# Patient Record
Sex: Female | Born: 1940
Health system: Southern US, Community
[De-identification: ages and names within clinical notes are randomized; demographics above are authoritative.]

## PROBLEM LIST (undated history)

## (undated) DIAGNOSIS — E785 Hyperlipidemia, unspecified: Secondary | ICD-10-CM

## (undated) DIAGNOSIS — Z803 Family history of malignant neoplasm of breast: Secondary | ICD-10-CM

## (undated) DIAGNOSIS — K219 Gastro-esophageal reflux disease without esophagitis: Secondary | ICD-10-CM

## (undated) DIAGNOSIS — C801 Malignant (primary) neoplasm, unspecified: Secondary | ICD-10-CM

## (undated) DIAGNOSIS — M549 Dorsalgia, unspecified: Secondary | ICD-10-CM

## (undated) DIAGNOSIS — G4730 Sleep apnea, unspecified: Secondary | ICD-10-CM

## (undated) DIAGNOSIS — M1990 Unspecified osteoarthritis, unspecified site: Secondary | ICD-10-CM

## (undated) DIAGNOSIS — M2550 Pain in unspecified joint: Secondary | ICD-10-CM

## (undated) DIAGNOSIS — E669 Obesity, unspecified: Secondary | ICD-10-CM

## (undated) DIAGNOSIS — M4800 Spinal stenosis, site unspecified: Secondary | ICD-10-CM

## (undated) DIAGNOSIS — E739 Lactose intolerance, unspecified: Secondary | ICD-10-CM

## (undated) DIAGNOSIS — I10 Essential (primary) hypertension: Secondary | ICD-10-CM

## (undated) DIAGNOSIS — N289 Disorder of kidney and ureter, unspecified: Secondary | ICD-10-CM

## (undated) DIAGNOSIS — Z800 Family history of malignant neoplasm of digestive organs: Secondary | ICD-10-CM

## (undated) DIAGNOSIS — G709 Myoneural disorder, unspecified: Secondary | ICD-10-CM

## (undated) DIAGNOSIS — Z923 Personal history of irradiation: Secondary | ICD-10-CM

## (undated) DIAGNOSIS — C50919 Malignant neoplasm of unspecified site of unspecified female breast: Secondary | ICD-10-CM

## (undated) HISTORY — DX: Unspecified osteoarthritis, unspecified site: M19.90

## (undated) HISTORY — DX: Dorsalgia, unspecified: M54.9

## (undated) HISTORY — DX: Malignant (primary) neoplasm, unspecified: C80.1

## (undated) HISTORY — PX: ABDOMINAL HYSTERECTOMY: SHX81

## (undated) HISTORY — DX: Disorder of kidney and ureter, unspecified: N28.9

## (undated) HISTORY — DX: Spinal stenosis, site unspecified: M48.00

## (undated) HISTORY — DX: Hyperlipidemia, unspecified: E78.5

## (undated) HISTORY — DX: Essential (primary) hypertension: I10

## (undated) HISTORY — DX: Family history of malignant neoplasm of breast: Z80.3

## (undated) HISTORY — DX: Lactose intolerance, unspecified: E73.9

## (undated) HISTORY — DX: Gastro-esophageal reflux disease without esophagitis: K21.9

## (undated) HISTORY — DX: Family history of malignant neoplasm of digestive organs: Z80.0

## (undated) HISTORY — PX: X-STOP IMPLANTATION: SHX2677

## (undated) HISTORY — DX: Obesity, unspecified: E66.9

## (undated) HISTORY — PX: BREAST SURGERY: SHX581

## (undated) HISTORY — DX: Pain in unspecified joint: M25.50

## (undated) HISTORY — PX: COLON SURGERY: SHX602

## (undated) HISTORY — DX: Myoneural disorder, unspecified: G70.9

## (undated) HISTORY — DX: Sleep apnea, unspecified: G47.30

---

## 1970-06-16 HISTORY — PX: BREAST EXCISIONAL BIOPSY: SUR124

## 1999-02-14 ENCOUNTER — Ambulatory Visit (HOSPITAL_COMMUNITY): Admission: RE | Admit: 1999-02-14 | Discharge: 1999-02-14 | Payer: Self-pay | Admitting: Gastroenterology

## 1999-02-22 ENCOUNTER — Ambulatory Visit (HOSPITAL_COMMUNITY): Admission: RE | Admit: 1999-02-22 | Discharge: 1999-02-22 | Payer: Self-pay | Admitting: Gastroenterology

## 1999-03-05 ENCOUNTER — Encounter: Payer: Self-pay | Admitting: General Surgery

## 1999-03-07 ENCOUNTER — Inpatient Hospital Stay (HOSPITAL_COMMUNITY): Admission: RE | Admit: 1999-03-07 | Discharge: 1999-03-13 | Payer: Self-pay | Admitting: General Surgery

## 1999-03-07 ENCOUNTER — Encounter (INDEPENDENT_AMBULATORY_CARE_PROVIDER_SITE_OTHER): Payer: Self-pay | Admitting: Specialist

## 1999-08-16 ENCOUNTER — Encounter: Admission: RE | Admit: 1999-08-16 | Discharge: 1999-08-16 | Payer: Self-pay | Admitting: Family Medicine

## 1999-08-16 ENCOUNTER — Encounter: Payer: Self-pay | Admitting: Family Medicine

## 1999-12-04 ENCOUNTER — Encounter (INDEPENDENT_AMBULATORY_CARE_PROVIDER_SITE_OTHER): Payer: Self-pay | Admitting: *Deleted

## 1999-12-04 ENCOUNTER — Ambulatory Visit (HOSPITAL_COMMUNITY): Admission: RE | Admit: 1999-12-04 | Discharge: 1999-12-04 | Payer: Self-pay | Admitting: Gastroenterology

## 2000-08-27 ENCOUNTER — Encounter: Payer: Self-pay | Admitting: Family Medicine

## 2000-08-27 ENCOUNTER — Encounter: Admission: RE | Admit: 2000-08-27 | Discharge: 2000-08-27 | Payer: Self-pay | Admitting: Family Medicine

## 2001-08-30 ENCOUNTER — Encounter: Payer: Self-pay | Admitting: Family Medicine

## 2001-08-30 ENCOUNTER — Encounter: Admission: RE | Admit: 2001-08-30 | Discharge: 2001-08-30 | Payer: Self-pay | Admitting: Family Medicine

## 2001-11-18 ENCOUNTER — Ambulatory Visit (HOSPITAL_COMMUNITY): Admission: RE | Admit: 2001-11-18 | Discharge: 2001-11-18 | Payer: Self-pay | Admitting: Family Medicine

## 2002-09-01 ENCOUNTER — Encounter: Admission: RE | Admit: 2002-09-01 | Discharge: 2002-09-01 | Payer: Self-pay | Admitting: Family Medicine

## 2002-09-01 ENCOUNTER — Encounter: Payer: Self-pay | Admitting: Family Medicine

## 2003-08-02 ENCOUNTER — Other Ambulatory Visit: Admission: RE | Admit: 2003-08-02 | Discharge: 2003-08-02 | Payer: Self-pay | Admitting: Family Medicine

## 2003-09-04 ENCOUNTER — Encounter: Admission: RE | Admit: 2003-09-04 | Discharge: 2003-09-04 | Payer: Self-pay | Admitting: Family Medicine

## 2004-09-04 ENCOUNTER — Encounter: Admission: RE | Admit: 2004-09-04 | Discharge: 2004-09-04 | Payer: Self-pay | Admitting: Family Medicine

## 2004-09-17 ENCOUNTER — Encounter: Admission: RE | Admit: 2004-09-17 | Discharge: 2004-09-17 | Payer: Self-pay | Admitting: Family Medicine

## 2005-09-08 ENCOUNTER — Encounter: Admission: RE | Admit: 2005-09-08 | Discharge: 2005-09-08 | Payer: Self-pay | Admitting: Family Medicine

## 2006-09-11 ENCOUNTER — Encounter: Admission: RE | Admit: 2006-09-11 | Discharge: 2006-09-11 | Payer: Self-pay | Admitting: Family Medicine

## 2007-09-16 ENCOUNTER — Encounter: Admission: RE | Admit: 2007-09-16 | Discharge: 2007-09-16 | Payer: Self-pay | Admitting: Family Medicine

## 2008-09-18 ENCOUNTER — Encounter: Admission: RE | Admit: 2008-09-18 | Discharge: 2008-09-18 | Payer: Self-pay | Admitting: Family Medicine

## 2009-08-30 ENCOUNTER — Encounter: Admission: RE | Admit: 2009-08-30 | Discharge: 2009-08-30 | Payer: Self-pay | Admitting: Family Medicine

## 2009-09-24 ENCOUNTER — Encounter: Admission: RE | Admit: 2009-09-24 | Discharge: 2009-09-24 | Payer: Self-pay | Admitting: Family Medicine

## 2010-06-10 ENCOUNTER — Emergency Department (HOSPITAL_COMMUNITY)
Admission: EM | Admit: 2010-06-10 | Discharge: 2010-06-10 | Payer: Self-pay | Source: Home / Self Care | Admitting: Emergency Medicine

## 2010-06-16 DIAGNOSIS — C50919 Malignant neoplasm of unspecified site of unspecified female breast: Secondary | ICD-10-CM

## 2010-06-16 DIAGNOSIS — Z923 Personal history of irradiation: Secondary | ICD-10-CM

## 2010-06-16 HISTORY — DX: Personal history of irradiation: Z92.3

## 2010-06-16 HISTORY — DX: Malignant neoplasm of unspecified site of unspecified female breast: C50.919

## 2010-06-16 HISTORY — PX: BREAST LUMPECTOMY: SHX2

## 2010-06-19 ENCOUNTER — Encounter
Admission: RE | Admit: 2010-06-19 | Discharge: 2010-06-19 | Payer: Self-pay | Source: Home / Self Care | Attending: Family Medicine | Admitting: Family Medicine

## 2010-08-26 ENCOUNTER — Other Ambulatory Visit: Payer: Self-pay | Admitting: Family Medicine

## 2010-08-26 DIAGNOSIS — Z1231 Encounter for screening mammogram for malignant neoplasm of breast: Secondary | ICD-10-CM

## 2010-08-26 LAB — POCT I-STAT, CHEM 8
Chloride: 105 mEq/L (ref 96–112)
Creatinine, Ser: 0.6 mg/dL (ref 0.4–1.2)
HCT: 37 % (ref 36.0–46.0)
TCO2: 28 mmol/L (ref 0–100)

## 2010-08-26 LAB — URINE MICROSCOPIC-ADD ON

## 2010-08-26 LAB — URINE CULTURE
Colony Count: 30000
Culture  Setup Time: 201112261700

## 2010-08-26 LAB — DIFFERENTIAL
Eosinophils Relative: 2 % (ref 0–5)
Lymphocytes Relative: 30 % (ref 12–46)
Lymphs Abs: 2.3 10*3/uL (ref 0.7–4.0)
Monocytes Relative: 6 % (ref 3–12)
Neutrophils Relative %: 62 % (ref 43–77)

## 2010-08-26 LAB — CBC
Hemoglobin: 12.3 g/dL (ref 12.0–15.0)
MCH: 30.3 pg (ref 26.0–34.0)
RDW: 13.3 % (ref 11.5–15.5)
WBC: 7.6 10*3/uL (ref 4.0–10.5)

## 2010-08-26 LAB — URINALYSIS, ROUTINE W REFLEX MICROSCOPIC
Nitrite: POSITIVE — AB
Specific Gravity, Urine: 1.021 (ref 1.005–1.030)

## 2010-09-30 ENCOUNTER — Ambulatory Visit
Admission: RE | Admit: 2010-09-30 | Discharge: 2010-09-30 | Disposition: A | Discharge status: Home / Self Care | Payer: Medicare Other | Source: Ambulatory Visit | Attending: Family Medicine | Admitting: Family Medicine

## 2010-09-30 DIAGNOSIS — Z1231 Encounter for screening mammogram for malignant neoplasm of breast: Secondary | ICD-10-CM

## 2010-10-01 ENCOUNTER — Other Ambulatory Visit: Payer: Self-pay | Admitting: Family Medicine

## 2010-10-01 DIAGNOSIS — R928 Other abnormal and inconclusive findings on diagnostic imaging of breast: Secondary | ICD-10-CM

## 2010-10-08 ENCOUNTER — Ambulatory Visit
Admission: RE | Admit: 2010-10-08 | Discharge: 2010-10-08 | Disposition: A | Discharge status: Home / Self Care | Payer: Medicare Other | Source: Ambulatory Visit | Attending: Family Medicine | Admitting: Family Medicine

## 2010-10-08 ENCOUNTER — Other Ambulatory Visit: Payer: Self-pay | Admitting: Family Medicine

## 2010-10-08 ENCOUNTER — Other Ambulatory Visit: Payer: Self-pay | Admitting: Diagnostic Radiology

## 2010-10-08 DIAGNOSIS — R928 Other abnormal and inconclusive findings on diagnostic imaging of breast: Secondary | ICD-10-CM

## 2010-10-09 ENCOUNTER — Other Ambulatory Visit: Payer: Self-pay | Admitting: Family Medicine

## 2010-10-09 DIAGNOSIS — C50911 Malignant neoplasm of unspecified site of right female breast: Secondary | ICD-10-CM

## 2010-10-11 ENCOUNTER — Other Ambulatory Visit: Payer: Self-pay | Admitting: Family Medicine

## 2010-10-11 ENCOUNTER — Other Ambulatory Visit: Payer: Medicare Other

## 2010-10-11 ENCOUNTER — Ambulatory Visit
Admission: RE | Admit: 2010-10-11 | Discharge: 2010-10-11 | Disposition: A | Discharge status: Home / Self Care | Payer: Medicare Other | Source: Ambulatory Visit | Attending: Family Medicine | Admitting: Family Medicine

## 2010-10-11 DIAGNOSIS — C50911 Malignant neoplasm of unspecified site of right female breast: Secondary | ICD-10-CM

## 2010-10-11 DIAGNOSIS — R928 Other abnormal and inconclusive findings on diagnostic imaging of breast: Secondary | ICD-10-CM

## 2010-10-11 MED ORDER — GADOBENATE DIMEGLUMINE 529 MG/ML IV SOLN
20.0000 mL | Freq: Once | INTRAVENOUS | Status: AC | PRN
  Administered 2010-10-11: 20 mL via INTRAVENOUS

## 2010-10-15 ENCOUNTER — Ambulatory Visit
Admission: RE | Admit: 2010-10-15 | Discharge: 2010-10-15 | Disposition: A | Discharge status: Home / Self Care | Payer: Medicare Other | Source: Ambulatory Visit | Attending: Family Medicine | Admitting: Family Medicine

## 2010-10-15 ENCOUNTER — Other Ambulatory Visit: Payer: Self-pay | Admitting: Diagnostic Radiology

## 2010-10-15 DIAGNOSIS — R928 Other abnormal and inconclusive findings on diagnostic imaging of breast: Secondary | ICD-10-CM

## 2010-10-15 MED ORDER — GADOBENATE DIMEGLUMINE 529 MG/ML IV SOLN
20.0000 mL | Freq: Once | INTRAVENOUS | Status: AC | PRN
  Administered 2010-10-15: 20 mL via INTRAVENOUS

## 2010-10-16 ENCOUNTER — Encounter (HOSPITAL_BASED_OUTPATIENT_CLINIC_OR_DEPARTMENT_OTHER): Payer: Medicare Other | Admitting: Oncology

## 2010-10-16 ENCOUNTER — Other Ambulatory Visit: Payer: Self-pay | Admitting: Family Medicine

## 2010-10-16 ENCOUNTER — Other Ambulatory Visit: Payer: Self-pay | Admitting: Oncology

## 2010-10-16 DIAGNOSIS — R928 Other abnormal and inconclusive findings on diagnostic imaging of breast: Secondary | ICD-10-CM

## 2010-10-16 DIAGNOSIS — C50919 Malignant neoplasm of unspecified site of unspecified female breast: Secondary | ICD-10-CM

## 2010-10-16 DIAGNOSIS — C50911 Malignant neoplasm of unspecified site of right female breast: Secondary | ICD-10-CM

## 2010-10-16 DIAGNOSIS — R9389 Abnormal findings on diagnostic imaging of other specified body structures: Secondary | ICD-10-CM

## 2010-10-16 LAB — CBC WITH DIFFERENTIAL/PLATELET
Basophils Absolute: 0 10*3/uL (ref 0.0–0.1)
Eosinophils Absolute: 0.3 10*3/uL (ref 0.0–0.5)
HCT: 35.9 % (ref 34.8–46.6)
HGB: 12.3 g/dL (ref 11.6–15.9)
LYMPH%: 26 % (ref 14.0–49.7)
MONO#: 0.5 10*3/uL (ref 0.1–0.9)
NEUT#: 4 10*3/uL (ref 1.5–6.5)
NEUT%: 61.4 % (ref 38.4–76.8)
Platelets: 236 10*3/uL (ref 145–400)
WBC: 6.6 10*3/uL (ref 3.9–10.3)

## 2010-10-16 LAB — CANCER ANTIGEN 27.29: CA 27.29: 10 U/mL (ref 0–39)

## 2010-10-16 LAB — COMPREHENSIVE METABOLIC PANEL
CO2: 28 mEq/L (ref 19–32)
Calcium: 9.5 mg/dL (ref 8.4–10.5)
Creatinine, Ser: 0.79 mg/dL (ref 0.40–1.20)
Glucose, Bld: 111 mg/dL — ABNORMAL HIGH (ref 70–99)
Total Bilirubin: 0.2 mg/dL — ABNORMAL LOW (ref 0.3–1.2)

## 2010-10-21 ENCOUNTER — Ambulatory Visit
Admission: RE | Admit: 2010-10-21 | Discharge: 2010-10-21 | Disposition: A | Discharge status: Home / Self Care | Payer: Medicare Other | Source: Ambulatory Visit | Attending: Family Medicine | Admitting: Family Medicine

## 2010-10-21 ENCOUNTER — Other Ambulatory Visit: Payer: Self-pay | Admitting: Diagnostic Radiology

## 2010-10-21 DIAGNOSIS — R9389 Abnormal findings on diagnostic imaging of other specified body structures: Secondary | ICD-10-CM

## 2010-10-21 DIAGNOSIS — R928 Other abnormal and inconclusive findings on diagnostic imaging of breast: Secondary | ICD-10-CM

## 2010-10-21 MED ORDER — GADOBENATE DIMEGLUMINE 529 MG/ML IV SOLN
20.0000 mL | Freq: Once | INTRAVENOUS | Status: AC | PRN
  Administered 2010-10-21: 20 mL via INTRAVENOUS

## 2010-10-24 ENCOUNTER — Other Ambulatory Visit: Payer: Self-pay | Admitting: General Surgery

## 2010-10-24 DIAGNOSIS — C50919 Malignant neoplasm of unspecified site of unspecified female breast: Secondary | ICD-10-CM

## 2010-10-25 ENCOUNTER — Other Ambulatory Visit (HOSPITAL_COMMUNITY): Payer: Self-pay | Admitting: General Surgery

## 2010-10-25 ENCOUNTER — Ambulatory Visit (HOSPITAL_COMMUNITY)
Admission: RE | Admit: 2010-10-25 | Discharge: 2010-10-25 | Disposition: A | Discharge status: Home / Self Care | Payer: Medicare Other | Source: Ambulatory Visit | Attending: General Surgery | Admitting: General Surgery

## 2010-10-25 ENCOUNTER — Encounter (HOSPITAL_COMMUNITY)
Admission: RE | Admit: 2010-10-25 | Discharge: 2010-10-25 | Disposition: A | Discharge status: Home / Self Care | Payer: Medicare Other | Source: Ambulatory Visit | Attending: General Surgery | Admitting: General Surgery

## 2010-10-25 DIAGNOSIS — C50911 Malignant neoplasm of unspecified site of right female breast: Secondary | ICD-10-CM

## 2010-10-25 DIAGNOSIS — Z01818 Encounter for other preprocedural examination: Secondary | ICD-10-CM | POA: Insufficient documentation

## 2010-10-25 DIAGNOSIS — C50919 Malignant neoplasm of unspecified site of unspecified female breast: Secondary | ICD-10-CM | POA: Insufficient documentation

## 2010-10-25 DIAGNOSIS — Z01812 Encounter for preprocedural laboratory examination: Secondary | ICD-10-CM | POA: Insufficient documentation

## 2010-10-25 DIAGNOSIS — Z01810 Encounter for preprocedural cardiovascular examination: Secondary | ICD-10-CM | POA: Insufficient documentation

## 2010-10-25 LAB — SURGICAL PCR SCREEN: Staphylococcus aureus: POSITIVE — AB

## 2010-10-28 ENCOUNTER — Ambulatory Visit (HOSPITAL_COMMUNITY)
Admission: RE | Admit: 2010-10-28 | Discharge: 2010-10-28 | Disposition: A | Discharge status: Home / Self Care | Payer: Medicare Other | Source: Ambulatory Visit | Attending: General Surgery | Admitting: General Surgery

## 2010-10-28 ENCOUNTER — Other Ambulatory Visit: Payer: Self-pay | Admitting: General Surgery

## 2010-10-28 ENCOUNTER — Ambulatory Visit
Admission: RE | Admit: 2010-10-28 | Discharge: 2010-10-28 | Disposition: A | Discharge status: Home / Self Care | Payer: Medicare Other | Source: Ambulatory Visit | Attending: General Surgery | Admitting: General Surgery

## 2010-10-28 DIAGNOSIS — C50919 Malignant neoplasm of unspecified site of unspecified female breast: Secondary | ICD-10-CM

## 2010-10-28 DIAGNOSIS — C50219 Malignant neoplasm of upper-inner quadrant of unspecified female breast: Secondary | ICD-10-CM | POA: Insufficient documentation

## 2010-10-28 DIAGNOSIS — M6289 Other specified disorders of muscle: Secondary | ICD-10-CM | POA: Insufficient documentation

## 2010-10-28 DIAGNOSIS — E669 Obesity, unspecified: Secondary | ICD-10-CM | POA: Insufficient documentation

## 2010-10-28 DIAGNOSIS — I10 Essential (primary) hypertension: Secondary | ICD-10-CM | POA: Insufficient documentation

## 2010-10-28 DIAGNOSIS — Z01810 Encounter for preprocedural cardiovascular examination: Secondary | ICD-10-CM | POA: Insufficient documentation

## 2010-10-28 DIAGNOSIS — C50911 Malignant neoplasm of unspecified site of right female breast: Secondary | ICD-10-CM

## 2010-10-28 DIAGNOSIS — Z01812 Encounter for preprocedural laboratory examination: Secondary | ICD-10-CM | POA: Insufficient documentation

## 2010-10-28 MED ORDER — TECHNETIUM TC 99M SULFUR COLLOID FILTERED
1.0000 | Freq: Once | INTRAVENOUS | Status: AC | PRN
  Administered 2010-10-28: 1 via INTRADERMAL

## 2010-10-30 NOTE — Op Note (Signed)
Kathleen [name], Kathleen [name]              ACCOUNT NO.:  [id number]  MEDICAL RECORD NO.:  [id number]           PATIENT TYPE:  O  LOCATION:  SDSC                         FACILITY:  [facility]  PHYSICIAN:  Adolph Pollack, M.D.DATE OF BIRTH:  1941/03/18  DATE OF PROCEDURE:  10/28/2010 DATE OF DISCHARGE:  10/28/2010                              OPERATIVE REPORT   PREOPERATIVE DIAGNOSIS:  Invasive right breast cancer.  POSTOPERATIVE DIAGNOSIS:  Invasive right breast cancer.  PROCEDURE: 1. Right axillary lymphatic mapping and injection of blue dye into     right breast. 2. Right axillary sentinel lymph node biopsy (two lymph nodes). 3. Right partial mastectomy after needle localization (upper inner     quadrant)  SURGEON:  Adolph Pollack, MD  ANESTHESIA:  General plus Marcaine local.  INDICATIONS:  Kathleen [name] is a 70 year old female who was found to have three radiographic lesions in the right breast.  Two are benign,however, one is at 2 o'clock position is invasive ductal carcinoma.  She now presents for the above procedures.  We discussed the procedure risks and aftercare preoperatively.  TECHNIQUE:  She was seen in the holding area and right breast was marked with my initials.  She then had a successful injection of radioactive material in the periareolar area.  She was then brought to the operating room, placed supine on the operating table and general anesthetic was administered.  The elevated radioactive counts were noted in the right axilla, and a marking pen was used to mark that site.  I then sterilely prepped the circumareolar area and injected 1.5 mL of methylene blue at 12, 3, 6, and 9 o'clock positions and massaged the breast.  Breast was then sterilely prepped and draped.  Using the NeoProbe, I once again identified the area of increased counts in the lower axilla and made a transverse lower axillary incision through the skin and subcutaneous tissue and then  divided the clavipectoral fascia entering the axillary area.  Using blunt dissection and using the NeoProbe to guide me with respect elevation of the counts, I was able to identify a small hot blue node.  This was removed with counts in the 500-600 range and marked as sentinel lymph node #1. Placing the NeoProbe back in, elevated counts were then noted.  I found a second node that was not blue, but had elevated counts, and this was sent as sentinel lymph node #2.  Following this, the counts were barely above background thus no other sentinel lymph nodes were found.  I then controlled bleeding with electrocautery.  I irrigated the wound and hemostasis was adequate.  Marcaine solution was infiltrated into the wound for local anesthetic effect.  I then closed the wound with interrupted 3-0 Vicryl sutures to approximate the subcutaneous tissue.  The skin was closed with 4-0 Monocryl subcuticular stitch.  Following this, I then approached the breast with a wire localization had been performed at the upper inner quadrant.  I made a curvilinear incision in the medial aspect of the breast and carried this down through the subcutaneous tissues with electrocautery.  I then brought the wire into the wound  and began raising subcutaneous flaps in all directions using electrocautery through the area below the tip of the wire as best as I could tell on mammography then I excised this lump of breast tissue.  I then marked it with silk suture, and a Faxitron mammogram was performed and demonstrated that the area was in the middle of the specimen and was adequate, and this was verified by the radiologist.  This was then sent to Pathology.  Following this, I then controlled bleeding with electrocautery.  I injected Marcaine solution into the tissues for local anesthetic effect. Once hemostasis was adequate, I approximated the subcutaneous tissues with interrupted 3-0 Vicryl sutures.  The skin was  closed with 4-0 Monocryl subcuticular stitch.  Steri-Strips and sterile dressings were applied.  She tolerated the procedure well without any apparent complications and was taken to the recovery room in satisfactory condition.     Adolph Pollack, M.D.     Kari Baars  D:  10/28/2010  T:  10/29/2010  Job:  [id number]  cc:   Drue Second, M.D. Maryln Gottron, M.D. Holley Bouche, M.D. James L. Malon Kindle., M.D.  Electronically Signed by Avel Peace M.D. on 10/30/2010 07:54:16 AM

## 2010-11-01 NOTE — Procedures (Signed)
[place]  Patient:    Kathleen [name], Kathleen [name] Visit Number: [id number] MRN: [id number]          Service Type: END Location: ENDO Attending Physician:  Kaleen Mask Dictated by:   Llana Aliment. Randa Evens, M.D. Proc. Date: 11/18/01 Admit Date:  11/18/2001   CC:         Wilburt Finlay, M.D.   Procedure Report  PROCEDURE: Colonoscopy.  MEDICATIONS:  Fentanyl 80 mcg and Versed 10 mg IV.  INDICATION:  The patient has a previous history of removal of a malignant polyp.  This is done as follow-up.  SURGEON:  James L. Randa Evens, M.D.  DESCRIPTION PROCEDURE: The procedure had been explained to the patient and consent obtained.  With the patient in the lithotomy decubitus position, the Olympus pediatric video colonoscope was inserted and advanced under direct visualization. In the rectosigmoid area the scar from the previous surgery was seen.  She had a previous low anterior resection. We passed this and advanced this to the cecum.  The ileocecal valve and appendiceal orifice were seen with no abnormalities. The scope was withdrawn and the colon carefully examined upon withdrawal.  The cecum, ascending colon, hepatic flexure, transverse colon, splenic flexure, descending and sigmoid colon were seen well upon withdrawal and were normal.  The patient tolerated the procedure well and was maintained on low flow oxygen and pulse oximetry throughout the procedure.  ASSESSMENT: 1. No evidence of further cancer or polyps.  PLAN:  We will recommend repeating in three years. Dictated by:   Llana Aliment. Randa Evens, M.D. Attending Physician:  Kaleen Mask DD:  11/18/01 TD:  11/20/01 Job: [id number]

## 2010-11-01 NOTE — Procedures (Signed)
[place]  Patient:    Kathleen [name], Kathleen [name]               MRN: [id number] Proc. Date: 12/04/99 Adm. Date:  [id number] Disc. Date: [id number] Attending:  Orland Mustard CC:         Hadassah Pais. Jeannetta Nap, M.D.             Rose Phi. Maple Hudson, M.D.                           Procedure Report  PROCEDURE:  Colonoscopy and polypectomy.  ENDOSCOPIST:  Llana Aliment. Edwards, M.D.  MEDICATIONS:  Fentanyl ___ mcg and Versed 8 mg IV.  INDICATIONS:  The patient had a malignant colon polyp removed a year ago with cancer diagnosis on the stalk of a polyp, and underwent a low anterior resection.  It was a dense polyp.  DESCRIPTION OF PROCEDURE:  The procedure had been explained to the patient and consent obtained.  With the patient in the left lateral decubitus position, the adult Olympus video colonoscope was inserted and advanced under direct visualization.  The anastomosis was seen a three-quarter centimeter polyp was seen here.  The anastomosis was sessile and appeared benign.  We advanced rapidly to the cecum.  The scope was withdrawn.  The cecum, ascending colon, hepatic flexure, transverse colon, splenic flexure, descending, and sigmoid colon were seen well upon removal and no further polyps seen.  We came back into the rectum.  This polyp was transected and sucked through the scope.  No other lesions were seen other than some internal hemorrhoids.  The patient tolerated the procedure well.  ASSESSMENT:  Rectal polyp, removed.  No evidence of recurrent cancer.  PLAN:  Will check pathology and recommend repeating procedure in two years. DD:  12/04/99 TD:  12/06/99 Job: [id number]

## 2010-11-05 ENCOUNTER — Encounter (HOSPITAL_BASED_OUTPATIENT_CLINIC_OR_DEPARTMENT_OTHER): Payer: Medicare Other | Admitting: Oncology

## 2010-11-05 DIAGNOSIS — Z170 Estrogen receptor positive status [ER+]: Secondary | ICD-10-CM

## 2010-11-05 DIAGNOSIS — C50919 Malignant neoplasm of unspecified site of unspecified female breast: Secondary | ICD-10-CM

## 2010-11-06 ENCOUNTER — Other Ambulatory Visit: Payer: Self-pay | Admitting: General Surgery

## 2010-11-06 DIAGNOSIS — R921 Mammographic calcification found on diagnostic imaging of breast: Secondary | ICD-10-CM

## 2010-11-07 ENCOUNTER — Ambulatory Visit
Admission: RE | Admit: 2010-11-07 | Discharge: 2010-11-07 | Disposition: A | Discharge status: Home / Self Care | Payer: Medicare Other | Source: Ambulatory Visit | Attending: Radiation Oncology | Admitting: Radiation Oncology

## 2010-11-07 DIAGNOSIS — Z170 Estrogen receptor positive status [ER+]: Secondary | ICD-10-CM | POA: Insufficient documentation

## 2010-11-07 DIAGNOSIS — Y842 Radiological procedure and radiotherapy as the cause of abnormal reaction of the patient, or of later complication, without mention of misadventure at the time of the procedure: Secondary | ICD-10-CM | POA: Insufficient documentation

## 2010-11-07 DIAGNOSIS — C50219 Malignant neoplasm of upper-inner quadrant of unspecified female breast: Secondary | ICD-10-CM | POA: Insufficient documentation

## 2010-11-07 DIAGNOSIS — Z510 Encounter for antineoplastic radiation therapy: Secondary | ICD-10-CM | POA: Insufficient documentation

## 2010-11-07 DIAGNOSIS — L988 Other specified disorders of the skin and subcutaneous tissue: Secondary | ICD-10-CM | POA: Insufficient documentation

## 2010-11-07 DIAGNOSIS — M4800 Spinal stenosis, site unspecified: Secondary | ICD-10-CM | POA: Insufficient documentation

## 2010-11-25 ENCOUNTER — Encounter (HOSPITAL_BASED_OUTPATIENT_CLINIC_OR_DEPARTMENT_OTHER): Payer: Medicare Other | Admitting: Oncology

## 2010-11-25 DIAGNOSIS — Z170 Estrogen receptor positive status [ER+]: Secondary | ICD-10-CM

## 2010-11-25 DIAGNOSIS — C50219 Malignant neoplasm of upper-inner quadrant of unspecified female breast: Secondary | ICD-10-CM

## 2010-11-28 ENCOUNTER — Ambulatory Visit
Admission: RE | Admit: 2010-11-28 | Discharge: 2010-11-28 | Disposition: A | Discharge status: Home / Self Care | Payer: Medicare Other | Source: Ambulatory Visit | Attending: General Surgery | Admitting: General Surgery

## 2010-11-28 DIAGNOSIS — R921 Mammographic calcification found on diagnostic imaging of breast: Secondary | ICD-10-CM

## 2010-12-17 ENCOUNTER — Other Ambulatory Visit: Payer: Self-pay | Admitting: Sports Medicine

## 2010-12-17 DIAGNOSIS — M545 Low back pain: Secondary | ICD-10-CM

## 2010-12-22 ENCOUNTER — Ambulatory Visit
Admission: RE | Admit: 2010-12-22 | Discharge: 2010-12-22 | Disposition: A | Discharge status: Home / Self Care | Payer: Medicare Other | Source: Ambulatory Visit | Attending: Sports Medicine | Admitting: Sports Medicine

## 2010-12-22 DIAGNOSIS — M545 Low back pain: Secondary | ICD-10-CM

## 2010-12-23 ENCOUNTER — Encounter (INDEPENDENT_AMBULATORY_CARE_PROVIDER_SITE_OTHER): Payer: Self-pay | Admitting: General Surgery

## 2010-12-25 ENCOUNTER — Ambulatory Visit (INDEPENDENT_AMBULATORY_CARE_PROVIDER_SITE_OTHER): Payer: Medicare Other | Admitting: General Surgery

## 2010-12-25 ENCOUNTER — Encounter (INDEPENDENT_AMBULATORY_CARE_PROVIDER_SITE_OTHER): Payer: Self-pay | Admitting: General Surgery

## 2010-12-25 DIAGNOSIS — C50919 Malignant neoplasm of unspecified site of unspecified female breast: Secondary | ICD-10-CM

## 2010-12-25 DIAGNOSIS — C50411 Malignant neoplasm of upper-outer quadrant of right female breast: Secondary | ICD-10-CM | POA: Insufficient documentation

## 2010-12-25 NOTE — Progress Notes (Signed)
She is here for her second postoperative visit after right partial mastectomy and sentinel lymph node biopsy. She is tolerating her radiation treatments well. She has some stiffness in the right axilla area.  Physical exam: The medial right breast incision is clean and intact. The right axillary incision is clean and intact. No lymphedema of the right arm.  Assessment: Right breast cancer status post breast conservation therapy. Wounds are healing well.  Plan: Return visit with me in 3 months. Activities as tolerated with right arm. Call if she has decreased range of motion of the right arm and we will send her to the after breast surgery physical therapy class.

## 2011-01-01 ENCOUNTER — Other Ambulatory Visit: Payer: Self-pay | Admitting: Orthopedic Surgery

## 2011-01-01 DIAGNOSIS — M4800 Spinal stenosis, site unspecified: Secondary | ICD-10-CM

## 2011-01-03 MED ORDER — DIAZEPAM 2 MG PO TABS: 5.0000 mg | ORAL_TABLET | Freq: Once | ORAL | Status: DC

## 2011-01-06 ENCOUNTER — Ambulatory Visit
Admission: RE | Admit: 2011-01-06 | Discharge: 2011-01-06 | Disposition: A | Discharge status: Home / Self Care | Payer: Medicare Other | Source: Ambulatory Visit | Attending: Orthopedic Surgery | Admitting: Orthopedic Surgery

## 2011-01-06 VITALS — BP 203/70 | HR 71

## 2011-01-06 DIAGNOSIS — M4800 Spinal stenosis, site unspecified: Secondary | ICD-10-CM

## 2011-01-06 DIAGNOSIS — M545 Low back pain: Secondary | ICD-10-CM

## 2011-01-06 MED ORDER — MEPERIDINE HCL 100 MG/ML IJ SOLN
75.0000 mg | Freq: Once | INTRAMUSCULAR | Status: AC
  Administered 2011-01-06: 75 mg via INTRAMUSCULAR

## 2011-01-06 MED ORDER — ONDANSETRON HCL 4 MG/2ML IJ SOLN
4.0000 mg | Freq: Once | INTRAMUSCULAR | Status: AC
  Administered 2011-01-06: 4 mg via INTRAMUSCULAR

## 2011-01-06 MED ORDER — DEXTROSE-NACL 5-0.45 % IV SOLN: INTRAVENOUS | Status: DC

## 2011-01-06 MED ORDER — IOHEXOL 180 MG/ML  SOLN
17.0000 mL | Freq: Once | INTRAMUSCULAR | Status: AC | PRN
  Administered 2011-01-06: 17 mL via INTRATHECAL

## 2011-01-06 NOTE — Progress Notes (Signed)
Kathleen [name] c/o pain upon arrival to nursing area post myelo, meds given.  1306, pt more comfortable at present.dd

## 2011-01-06 NOTE — Progress Notes (Signed)
Pt has already taken 10 mg of valium before arrival and was going to take another and I asked her not to. She must be able to stand for some of her films.dd

## 2011-01-08 ENCOUNTER — Other Ambulatory Visit: Payer: Self-pay | Admitting: Oncology

## 2011-01-08 ENCOUNTER — Encounter (HOSPITAL_BASED_OUTPATIENT_CLINIC_OR_DEPARTMENT_OTHER): Payer: Medicare Other | Admitting: Oncology

## 2011-01-08 DIAGNOSIS — Z170 Estrogen receptor positive status [ER+]: Secondary | ICD-10-CM

## 2011-01-08 DIAGNOSIS — C50219 Malignant neoplasm of upper-inner quadrant of unspecified female breast: Secondary | ICD-10-CM

## 2011-01-08 DIAGNOSIS — C50919 Malignant neoplasm of unspecified site of unspecified female breast: Secondary | ICD-10-CM

## 2011-01-08 LAB — COMPREHENSIVE METABOLIC PANEL
ALT: 14 U/L (ref 0–35)
CO2: 28 mEq/L (ref 19–32)
Calcium: 9.2 mg/dL (ref 8.4–10.5)
Chloride: 100 mEq/L (ref 96–112)
Creatinine, Ser: 0.71 mg/dL (ref 0.50–1.10)
Glucose, Bld: 137 mg/dL — ABNORMAL HIGH (ref 70–99)
Total Bilirubin: 0.3 mg/dL (ref 0.3–1.2)

## 2011-01-08 LAB — CBC WITH DIFFERENTIAL/PLATELET
Basophils Absolute: 0 10*3/uL (ref 0.0–0.1)
Eosinophils Absolute: 0.1 10*3/uL (ref 0.0–0.5)
HCT: 35.9 % (ref 34.8–46.6)
HGB: 12.1 g/dL (ref 11.6–15.9)
LYMPH%: 19.6 % (ref 14.0–49.7)
MONO#: 0.4 10*3/uL (ref 0.1–0.9)
NEUT#: 4.1 10*3/uL (ref 1.5–6.5)
NEUT%: 71.3 % (ref 38.4–76.8)
Platelets: 208 10*3/uL (ref 145–400)
WBC: 5.8 10*3/uL (ref 3.9–10.3)
lymph#: 1.1 10*3/uL (ref 0.9–3.3)

## 2011-02-11 ENCOUNTER — Encounter (HOSPITAL_BASED_OUTPATIENT_CLINIC_OR_DEPARTMENT_OTHER): Payer: Medicare Other | Admitting: Oncology

## 2011-02-11 ENCOUNTER — Other Ambulatory Visit: Payer: Self-pay | Admitting: Oncology

## 2011-02-11 DIAGNOSIS — C50219 Malignant neoplasm of upper-inner quadrant of unspecified female breast: Secondary | ICD-10-CM

## 2011-02-11 DIAGNOSIS — Z170 Estrogen receptor positive status [ER+]: Secondary | ICD-10-CM

## 2011-02-11 LAB — CBC WITH DIFFERENTIAL/PLATELET
BASO%: 0.5 % (ref 0.0–2.0)
Eosinophils Absolute: 0.2 10*3/uL (ref 0.0–0.5)
HCT: 34 % — ABNORMAL LOW (ref 34.8–46.6)
LYMPH%: 23.9 % (ref 14.0–49.7)
MCHC: 34.4 g/dL (ref 31.5–36.0)
MONO#: 0.4 10*3/uL (ref 0.1–0.9)
NEUT#: 3.6 10*3/uL (ref 1.5–6.5)
NEUT%: 64 % (ref 38.4–76.8)
Platelets: 228 10*3/uL (ref 145–400)
RBC: 3.77 10*6/uL (ref 3.70–5.45)
WBC: 5.6 10*3/uL (ref 3.9–10.3)
lymph#: 1.3 10*3/uL (ref 0.9–3.3)

## 2011-02-11 LAB — COMPREHENSIVE METABOLIC PANEL
AST: 14 U/L (ref 0–37)
Albumin: 4.4 g/dL (ref 3.5–5.2)
Alkaline Phosphatase: 57 U/L (ref 39–117)
BUN: 20 mg/dL (ref 6–23)
Calcium: 9.2 mg/dL (ref 8.4–10.5)
Chloride: 102 mEq/L (ref 96–112)
Glucose, Bld: 134 mg/dL — ABNORMAL HIGH (ref 70–99)
Potassium: 4.2 mEq/L (ref 3.5–5.3)
Sodium: 141 mEq/L (ref 135–145)
Total Protein: 6.8 g/dL (ref 6.0–8.3)

## 2011-03-04 ENCOUNTER — Ambulatory Visit (HOSPITAL_COMMUNITY)
Admission: RE | Admit: 2011-03-04 | Discharge: 2011-03-04 | Disposition: A | Discharge status: Home / Self Care | Payer: Medicare Other | Source: Ambulatory Visit | Attending: Orthopedic Surgery | Admitting: Orthopedic Surgery

## 2011-03-04 ENCOUNTER — Other Ambulatory Visit (HOSPITAL_COMMUNITY): Payer: Self-pay | Admitting: Orthopedic Surgery

## 2011-03-04 ENCOUNTER — Encounter (HOSPITAL_COMMUNITY): Payer: Medicare Other

## 2011-03-04 DIAGNOSIS — Z01812 Encounter for preprocedural laboratory examination: Secondary | ICD-10-CM | POA: Insufficient documentation

## 2011-03-04 DIAGNOSIS — Z01818 Encounter for other preprocedural examination: Secondary | ICD-10-CM

## 2011-03-04 DIAGNOSIS — M47817 Spondylosis without myelopathy or radiculopathy, lumbosacral region: Secondary | ICD-10-CM | POA: Insufficient documentation

## 2011-03-04 DIAGNOSIS — M4800 Spinal stenosis, site unspecified: Secondary | ICD-10-CM | POA: Insufficient documentation

## 2011-03-04 DIAGNOSIS — Z01811 Encounter for preprocedural respiratory examination: Secondary | ICD-10-CM | POA: Insufficient documentation

## 2011-03-04 LAB — COMPREHENSIVE METABOLIC PANEL
ALT: 14 U/L (ref 0–35)
Albumin: 3.7 g/dL (ref 3.5–5.2)
Alkaline Phosphatase: 70 U/L (ref 39–117)
Calcium: 9.7 mg/dL (ref 8.4–10.5)
GFR calc Af Amer: >60 mL/min (ref >60)
Glucose, Bld: 119 mg/dL — ABNORMAL HIGH (ref 70–99)
Potassium: 4.2 mEq/L (ref 3.5–5.1)
Sodium: 137 mEq/L (ref 135–145)
Total Protein: 7.5 g/dL (ref 6.0–8.3)

## 2011-03-04 LAB — URINALYSIS, ROUTINE W REFLEX MICROSCOPIC
Bilirubin Urine: NEGATIVE
Hgb urine dipstick: NEGATIVE
Nitrite: NEGATIVE
Protein, ur: NEGATIVE mg/dL
Specific Gravity, Urine: 1.011 (ref 1.005–1.030)
Urobilinogen, UA: 0.2 mg/dL (ref 0.0–1.0)

## 2011-03-04 LAB — CBC
MCH: 30.1 pg (ref 26.0–34.0)
MCHC: 33.1 g/dL (ref 30.0–36.0)
Platelets: 255 10*3/uL (ref 150–400)
RDW: 13 % (ref 11.5–15.5)

## 2011-03-04 LAB — PROTIME-INR
INR: 0.97 (ref 0.00–1.49)
Prothrombin Time: 13.1 seconds (ref 11.6–15.2)

## 2011-03-04 LAB — DIFFERENTIAL
Basophils Absolute: 0 10*3/uL (ref 0.0–0.1)
Basophils Relative: 1 % (ref 0–1)
Eosinophils Absolute: 0.2 10*3/uL (ref 0.0–0.7)
Eosinophils Relative: 3 % (ref 0–5)
Monocytes Absolute: 0.3 10*3/uL (ref 0.1–1.0)
Monocytes Relative: 6 % (ref 3–12)
Neutro Abs: 4 10*3/uL (ref 1.7–7.7)

## 2011-03-04 LAB — SURGICAL PCR SCREEN: MRSA, PCR: NEGATIVE

## 2011-03-05 ENCOUNTER — Ambulatory Visit
Admission: RE | Admit: 2011-03-05 | Discharge: 2011-03-05 | Disposition: A | Discharge status: Home / Self Care | Payer: Medicare Other | Source: Ambulatory Visit | Attending: Radiation Oncology | Admitting: Radiation Oncology

## 2011-03-12 ENCOUNTER — Other Ambulatory Visit (HOSPITAL_COMMUNITY): Payer: Self-pay | Admitting: Orthopedic Surgery

## 2011-03-12 ENCOUNTER — Ambulatory Visit (HOSPITAL_COMMUNITY)
Admission: RE | Admit: 2011-03-12 | Discharge: 2011-03-12 | Disposition: A | Discharge status: Home / Self Care | Payer: Medicare Other | Source: Ambulatory Visit | Attending: Orthopedic Surgery | Admitting: Orthopedic Surgery

## 2011-03-12 ENCOUNTER — Inpatient Hospital Stay (HOSPITAL_COMMUNITY)
Admission: RE | Admit: 2011-03-12 | Discharge: 2011-03-14 | DRG: 491 | Disposition: A | Discharge status: Home Health | Payer: Medicare Other | Source: Ambulatory Visit | Attending: Orthopedic Surgery | Admitting: Orthopedic Surgery

## 2011-03-12 DIAGNOSIS — M519 Unspecified thoracic, thoracolumbar and lumbosacral intervertebral disc disorder: Secondary | ICD-10-CM

## 2011-03-12 DIAGNOSIS — E785 Hyperlipidemia, unspecified: Secondary | ICD-10-CM | POA: Diagnosis present

## 2011-03-12 DIAGNOSIS — Z01812 Encounter for preprocedural laboratory examination: Secondary | ICD-10-CM

## 2011-03-12 DIAGNOSIS — M79609 Pain in unspecified limb: Secondary | ICD-10-CM | POA: Diagnosis present

## 2011-03-12 DIAGNOSIS — M48061 Spinal stenosis, lumbar region without neurogenic claudication: Principal | ICD-10-CM | POA: Diagnosis present

## 2011-03-12 DIAGNOSIS — Z853 Personal history of malignant neoplasm of breast: Secondary | ICD-10-CM

## 2011-03-12 DIAGNOSIS — Z85038 Personal history of other malignant neoplasm of large intestine: Secondary | ICD-10-CM

## 2011-03-12 DIAGNOSIS — I10 Essential (primary) hypertension: Secondary | ICD-10-CM | POA: Diagnosis present

## 2011-03-12 DIAGNOSIS — Z01818 Encounter for other preprocedural examination: Secondary | ICD-10-CM

## 2011-03-12 LAB — TYPE AND SCREEN
ABO/RH(D): O POS
Antibody Screen: NEGATIVE

## 2011-03-17 NOTE — Discharge Summary (Addendum)
  [name], Kathleen [name]              ACCOUNT NO.:  [id number]  MEDICAL RECORD NO.:  [id number]  LOCATION:                               FACILITY:  [facility]  PHYSICIAN:  Georges Lynch. Anorah Trias, M.D.DATE OF BIRTH:  05-26-1941  DATE OF ADMISSION:  03/12/2011 DATE OF DISCHARGE:  03/12/2011                              DISCHARGE SUMMARY   She was taken to surgery on March 12, 2011, did a complete decompressive lumbar laminectomy, L4-5 foraminotomies for the L4 and L5 roots bilaterally.  Postoperative leg pain completely dissipated.  On March 13, 2011, she was doing fine.  She was afebrile, had a little weakness in her leg, so I decided just to keep her extra day for therapy and pain control, and also she was nauseated.  On March 14, 2011, she was fine.  She is upset in the chair.  No leg pain.  She felt much better.  She was ambulating with a walker and I would like to discharge her.  Her platelet count 255, white count 6100, hemoglobin 12, hematocrit 36.2.  The C metabolic was normal, glucose except was slightly elevated at 119.  Her sodium was 137, potassium 42, glucose 119, BUN 15, creatinine 0.59, calcium 9.7.  Her SGOT was 15, SGPT 14, alkaline phosphatase 70.  The prothrombin time was 31, INR was 0.97, PTT was normal at 33.  Her urinalysis was normal.  The x-rays and myelogram reports were all on the chart.  Chest x-ray showed no active pulmonary disease.  EKG showed sinus bradycardia.  The discharge condition was improved.  DISCHARGE DIET:  She will remain on her preop diet.  DISCHARGE MEDICATIONS:  She will be on her same medicines __________ discharge manager for home care.  She also will be on Robaxin 500 mg t.i.d. p.r.n. for spasms, Percocet 10/650 one every 4 hours p.r.n. for pain.  DISCHARGE CONDITION:  Improved.  DISCHARGE INSTRUCTIONS:  See me in 2 weeks or prior to if she has any problem and also to change her dressing daily.     ______________________________ Georges Lynch Darrelyn Hillock, M.D.     RAG/MEDQ  D:  03/14/2011  T:  03/14/2011  Job:  [id number]  Electronically Signed by Ranee Gosselin M.D. on 03/18/2011 02:08:13 PM

## 2011-03-17 NOTE — Op Note (Addendum)
Kathleen [name], Kathleen [name]              ACCOUNT NO.:  [id number]  MEDICAL RECORD NO.:  [id number]  LOCATION:                               FACILITY:  [facility]  PHYSICIAN:  Kathleen [name], Kathleen BradleyDATE OF BIRTH:  08/20/1940  DATE OF PROCEDURE:  03/12/2011 DATE OF DISCHARGE:  03/12/2011                              OPERATIVE REPORT   SURGEON:  Kathleen [name], M.D.  ASSISTANT:  Kathleen [name], M.D.  PREOPERATIVE DIAGNOSIS:  Severe foraminal stenosis at L4-5 with facet bilaterally.  Note:  She had severe left leg pain preop.  POSTOPERATIVE DIAGNOSIS:  Severe foraminal stenosis at L4-5 with facet bilaterally.  Note:  She had severe left leg pain preop.  PROCEDURE: 1. Complete decompressive lumbar laminectomy at L4-L5. 2. Foraminotomies for the L4-L5 root bilaterally.  PROCEDURE IN DETAIL:  Under general anesthesia routine orthopedic prep and draping in the low back was carried out with the patient on spinal frame.  The appropriate time-out was carried out prior to surgery.  I also marked the appropriate left side of her back because the symptoms were all on the left.  I marked her back in the holding area.  This time after sterile prep and draping, two needles were placed in the back for localization purposes.  X-ray was taken to verify the position. Following that, an incision was made over the L4-5 space and extended proximally and distally.  Note, she was markedly overweight.  This was a complex case because of her weight.  At this time, the dissection then was carried out along the spinous process of L4 and L5 bilaterally and the lamina.  Kathleen [name] did the dissection on the left side, I did not on the right, and we then were able to completely separate the muscle from both sides of the midline.  We then inserted self-retaining retractors.  Dr. [name] helped with the cauterization during the procedure.  We then carried out a complete decompressive lumbar laminectomy at L4-5 after  another x-ray was taken.  We then went down and identified the dura.  The microscope was brought in.  At this time, I then decompressed the lateral recess on the left side from the from operating the right side.  Dr. [name] decompressed the recess on the right side.  We then completed the laminectomy.  We protected the dura with the cottonoids and at this time we carefully dissected the lamina from the dura.  Note:  She had a pseudo spondylolisthesis at L4-5. Following that, we now decompressed the lateral recesses.  Dr. [name] decompressed the recess on the right side and I decompressed the left. We then went up proximally and distally and exposed the foramina for both sides for the L5 and L4 root.  When we were finished with the decompression, we were then able to easily pass a hockey-stick proximally, distally, and out the foramina bilaterally and we had complete openings now.  Note:  We carried this dissection out and a decompression until we knew that we had a complete wide decompression. We did preserve the process bilaterally.  I thoroughly irrigated out the area.  Another x-ray was taken to verify our position.  I then  injected 10 cc of FloSeal into the soft tissue area and we made sure there was no compression on the dura.  I then loosely applied some thrombin-soaked Gelfoam around the lateral recess regions.  The wound then was closed in layers in the usual fashion.  I did leave a small deep distal and proximal part of the wound open for drainage purposes.  Subcu was closed with 0 Vicryl, skin with metal staples, and a sterile Neosporin dressing applied.  The patient had 2 g of IV Ancef preop.  Note:  Before I made the incision, I did inject approximately 20-30 cc of 0.25% Marcaine and epinephrine into the soft tissue structures.  After sterile dressings were applied, the patient left the operating room in satisfactory condition.  The blood loss was 125 cc.           ______________________________ Kathleen [name]. Kathleen [name], M.D.     RAG/MEDQ  D:  03/12/2011  T:  03/13/2011  Job:  [id number]  cc:   Kathleen [name], M.D. Fax: [phone]  Electronically Signed by Kathleen [name] M.D. on 03/18/2011 02:08:07 PM

## 2011-03-18 NOTE — Discharge Summary (Signed)
  [name], [name]              ACCOUNT NO.:  [id number]  MEDICAL RECORD NO.:  [id number]  LOCATION:                               FACILITY:  [facility]  PHYSICIAN:  Georges Lynch. Alazae Crymes, M.D.DATE OF BIRTH:  22-Nov-1940  DATE OF ADMISSION: DATE OF DISCHARGE:                              DISCHARGE SUMMARY   The only progress note is listed as March 13, 2011.  When I was down here, she still has weakness in her legs, nausea and vomiting, and we feel she has to remain another day for pain control and ambulates with therapy.  She has not been strong enough to ambulate with therapy.  From September 28, she was doing fine.  She had no leg pain and no overall problems and it was elected to discharge her now.  I did do a discharge summary on her and her number is [phone], so it is a missing dictated summary, but that was done.  I am not sure why that came up on a computer as missing and it is the same with her operative note which was done on March 15, 2011, that was definitely dictated as well, so there is something wrong in this information that was given to me.          ______________________________ Georges Lynch. Darrelyn Hillock, M.D.     RAG/MEDQ  D:  03/17/2011  T:  03/18/2011  Job:  [id number]  Electronically Signed by Ranee Gosselin M.D. on 03/18/2011 02:08:01 PM

## 2011-03-18 NOTE — H&P (Signed)
  NAMEAVIONA, Kathleen [name]              ACCOUNT NO.:  [id number]  MEDICAL RECORD NO.:  [id number]  LOCATION:                               FACILITY:  [facility]  PHYSICIAN:  Georges Lynch. Maranatha Grossi, M.D.DATE OF BIRTH:  [birth year]  DATE OF ADMISSION: DATE OF DISCHARGE:                             HISTORY & PHYSICAL   She was admitted to the hospital by me with a chief complaint of pain in her back and pain radiating down her left lower extremity.  She had an MRI done and workup that evaluated her for spinal stenosis at L4-5.  PAST HISTORY:  She has a history of breast and colon cancer and hypertension.  SOCIAL HISTORY:  Alcohol occasional.  Tobacco denied.  FAMILY HISTORY:  Cancer and diabetes.  REVIEW OF SYSTEMS:  She has a history of hypertension, hypercholesterolemia, and degenerative disk disease.  MEDICATIONS:  All listed on her hospital admission summary.  The basic medication list that she is on as I mentioned around the med reconciliation sheet.  She is on: 1. Fexofenadine hydrochloride over the counter. 2. Vitamins. 3. Tylenol Extra Strength. 4. Calcium carbonate. 5. Aspirin 81 mg. 6. Docusate. 7. Omeprazole 40 mg a day.  PHYSICAL EXAMINATION:  VITAL SIGNS:  The main issues that were discussed here by me as far as her blood pressure was concerned.  Blood pressure 150/80, her pulse is 78, temperature 98, respirations 18. HEAD, EYES, EARS, NOSE, AND THROAT:  There are no oral lesions. NECK:  Negative. LUNGS:  Clear. HEART:  Normal sinus rhythm with no murmur. ABDOMEN:  Negative.  No masses or tenderness noted. BREAST EXAM:  Deferred. BACK:  She has painful motion of the lower lumbar spine. EXTREMITIES:  She had a positive straight leg raising as I mentioned earlier.  She had weakness of the dorsiflexors of her foot as well. Sensory exam was intact.  Circulation was intact.  Her hips showed normal painless range of motion of both hips.  Knees, normal painless range of motion  of both knees.  Calves soft and nontender.  No signs of any phlebitis.  IMPRESSION:  Spinal stenosis at L4-5.  PLAN:  The plan was to do a central decompressive lumbar laminectomy at the L4-5 and to consider microdiskectomy if there was a significant herniated disk.          ______________________________ Georges Lynch. Darrelyn Hillock, M.D.     RAG/MEDQ  D:  03/17/2011  T:  03/18/2011  Job:  [id number]  Electronically Signed by Ranee Gosselin M.D. on 03/18/2011 02:08:04 PM

## 2011-04-14 ENCOUNTER — Other Ambulatory Visit: Payer: Self-pay | Admitting: Oncology

## 2011-04-14 ENCOUNTER — Encounter (HOSPITAL_BASED_OUTPATIENT_CLINIC_OR_DEPARTMENT_OTHER): Payer: Medicare Other | Admitting: Oncology

## 2011-04-14 DIAGNOSIS — C50219 Malignant neoplasm of upper-inner quadrant of unspecified female breast: Secondary | ICD-10-CM

## 2011-04-14 DIAGNOSIS — Z170 Estrogen receptor positive status [ER+]: Secondary | ICD-10-CM

## 2011-04-14 LAB — COMPREHENSIVE METABOLIC PANEL
ALT: 15 U/L (ref 0–35)
AST: 19 U/L (ref 0–37)
Albumin: 4.2 g/dL (ref 3.5–5.2)
Alkaline Phosphatase: 53 U/L (ref 39–117)
Potassium: 4.3 mEq/L (ref 3.5–5.3)
Sodium: 137 mEq/L (ref 135–145)
Total Bilirubin: 0.3 mg/dL (ref 0.3–1.2)
Total Protein: 6.8 g/dL (ref 6.0–8.3)

## 2011-04-14 LAB — CBC WITH DIFFERENTIAL/PLATELET
BASO%: 0.3 % (ref 0.0–2.0)
LYMPH%: 21.1 % (ref 14.0–49.7)
MCHC: 34.2 g/dL (ref 31.5–36.0)
MCV: 90.3 fL (ref 79.5–101.0)
MONO%: 7.8 % (ref 0.0–14.0)
NEUT#: 4 10*3/uL (ref 1.5–6.5)
Platelets: 233 10*3/uL (ref 145–400)
RBC: 3.91 10*6/uL (ref 3.70–5.45)
RDW: 13.4 % (ref 11.2–14.5)
WBC: 5.9 10*3/uL (ref 3.9–10.3)

## 2011-04-24 ENCOUNTER — Encounter: Payer: Self-pay | Admitting: Oncology

## 2011-04-24 ENCOUNTER — Ambulatory Visit (INDEPENDENT_AMBULATORY_CARE_PROVIDER_SITE_OTHER): Payer: Medicare Other | Admitting: General Surgery

## 2011-04-24 ENCOUNTER — Encounter (INDEPENDENT_AMBULATORY_CARE_PROVIDER_SITE_OTHER): Payer: Self-pay | Admitting: General Surgery

## 2011-04-24 VITALS — BP 142/84 | HR 64 | Temp 96.6°F | Resp 18 | Ht 65.0 in | Wt 274.4 lb

## 2011-04-24 DIAGNOSIS — C50919 Malignant neoplasm of unspecified site of unspecified female breast: Secondary | ICD-10-CM

## 2011-04-24 DIAGNOSIS — C50911 Malignant neoplasm of unspecified site of right female breast: Secondary | ICD-10-CM

## 2011-04-24 NOTE — Progress Notes (Signed)
Operation: Right partial mastectomy and sentinel lymph node biopsy  Date: Oct 28, 2010  Stage: Stage I  Hormone receptor status: Positive  HPI:Kathleen [name] is here for follow up of her right breast cancer. She denies any nodules in her right breast. She denies any swelling under her arm or in her neck. She recently had surgery for spinal stenosis. She has been started on tamoxifen.  PE: Gen.-overweight in no acute distress.  Right breast-well-healed medial scar with some radiation changes but no dominant masses.  Lymph nodes-no palpable right axillary, supraclavicular, or cervical adenopathy.  Assessment:  Stage I right breast cancer-no clinical evidence of recurrence. Has been started on tamoxifen  Plan: Return visit 4 months.

## 2011-04-29 ENCOUNTER — Encounter: Payer: Self-pay | Admitting: *Deleted

## 2011-05-23 ENCOUNTER — Other Ambulatory Visit: Payer: Self-pay | Admitting: *Deleted

## 2011-05-23 DIAGNOSIS — C50919 Malignant neoplasm of unspecified site of unspecified female breast: Secondary | ICD-10-CM

## 2011-05-23 MED ORDER — TAMOXIFEN CITRATE 20 MG PO TABS: 20.0000 mg | ORAL_TABLET | Freq: Every day | ORAL | Status: DC

## 2011-06-25 ENCOUNTER — Encounter: Payer: Self-pay | Admitting: *Deleted

## 2011-06-25 NOTE — Progress Notes (Unsigned)
REC'D CALL FROM PT STATING THAT SHE HAS A TEMP OF 100 AND HER RIGHT BREAST IS RED AND PAINFUL (SURGICAL SITE) PT HAD A BX 10/28/10. PT WOULD LIKE TO BE SEEN TODAY BY MD. PT IS AWARE THAT MD IS OUT OF THE OFFICE TODAY AND STATES THAT SHE WOULD CALL HER PCP. PT WAS INSTRUCTED TO TAKE TYLENOL FOR LOW GRADE TEMP. PT WILL CALL THIS DESK IF UNABLE TO BE SEEN BY PCP. WILL REVIEW THIS NOTE WITH MD UPON RETURN

## 2011-07-21 ENCOUNTER — Other Ambulatory Visit (HOSPITAL_BASED_OUTPATIENT_CLINIC_OR_DEPARTMENT_OTHER): Payer: Medicare Other | Admitting: Lab

## 2011-07-21 ENCOUNTER — Encounter: Payer: Self-pay | Admitting: Oncology

## 2011-07-21 ENCOUNTER — Ambulatory Visit (HOSPITAL_BASED_OUTPATIENT_CLINIC_OR_DEPARTMENT_OTHER): Payer: Medicare Other | Admitting: Oncology

## 2011-07-21 ENCOUNTER — Telehealth: Payer: Self-pay | Admitting: *Deleted

## 2011-07-21 VITALS — BP 127/77 | HR 69 | Temp 98.2°F | Ht 64.0 in | Wt 275.7 lb

## 2011-07-21 DIAGNOSIS — C50919 Malignant neoplasm of unspecified site of unspecified female breast: Secondary | ICD-10-CM

## 2011-07-21 DIAGNOSIS — Z170 Estrogen receptor positive status [ER+]: Secondary | ICD-10-CM

## 2011-07-21 DIAGNOSIS — C50219 Malignant neoplasm of upper-inner quadrant of unspecified female breast: Secondary | ICD-10-CM

## 2011-07-21 LAB — CBC WITH DIFFERENTIAL/PLATELET
EOS%: 4.4 % (ref 0.0–7.0)
Eosinophils Absolute: 0.2 10*3/uL (ref 0.0–0.5)
LYMPH%: 30.5 % (ref 14.0–49.7)
MCH: 30.5 pg (ref 25.1–34.0)
MCHC: 33.9 g/dL (ref 31.5–36.0)
MCV: 90 fL (ref 79.5–101.0)
MONO%: 12.2 % (ref 0.0–14.0)
NEUT#: 2.2 10*3/uL (ref 1.5–6.5)
Platelets: 184 10*3/uL (ref 145–400)
RBC: 3.8 10*6/uL (ref 3.70–5.45)

## 2011-07-21 LAB — COMPREHENSIVE METABOLIC PANEL
AST: 12 U/L (ref 0–37)
Alkaline Phosphatase: 52 U/L (ref 39–117)
Glucose, Bld: 140 mg/dL — ABNORMAL HIGH (ref 70–99)
Sodium: 139 mEq/L (ref 135–145)
Total Bilirubin: 0.2 mg/dL — ABNORMAL LOW (ref 0.3–1.2)
Total Protein: 6.3 g/dL (ref 6.0–8.3)

## 2011-07-21 MED ORDER — TAMOXIFEN CITRATE 20 MG PO TABS: 20.0000 mg | ORAL_TABLET | Freq: Every day | ORAL | Status: AC

## 2011-07-21 NOTE — Telephone Encounter (Signed)
gave patient copy of the calendar for 01-21-2012 starting at 11:00 am with labs

## 2011-07-21 NOTE — Progress Notes (Signed)
OFFICE PROGRESS NOTE  CC  [name], MD, MD [facility] [address] [place] [address] Dr. Avel Peace Dr. Chipper Herb  DIAGNOSIS: 71 year old female with stage I (T1 C. N0) invasive ductal carcinoma of the right breast diagnosed April 2012.  PRIOR THERAPY:  #1 status post lumpectomy of the right breast on 10/28/2010 with the final pathology revealing a 1.8 cm invasive ductal carcinoma grade 2 with associated DCIS. Tumor was ER +100% PR +100% proliferation marker 26% HER-2/neu negative. 2 sentinel nodes were negative for metastatic disease.  #2 Oncotype DX was performed and the score was 0.  #3 patient underwent radiation therapy completed between 12/10/2010 2 01/28/2011.  #4 patient is status post surgery of her back for spinal stenosis on 03/12/2011.  #5 patient was then begun on adjuvant tamoxifen 20 mg daily this was started on 04/14/2011.  CURRENT THERAPY: Tamoxifen 20 mg daily. Since 04/14/2011  INTERVAL HISTORY: Kathleen [name] 71 y.o. female returns for followup visit today. Overall she seems to be doing well. She is really not bothered by the tamoxifen at all. She is denying any headaches double vision blurring of vision difficulty in swallowing she has not experienced any psychomotor issues with tamoxifen no hot flashes no myalgias or arthralgias she has no shortness of breath chest pains palpitations she denies having any vaginal bleeding or discharge. She has no myalgias or arthralgias no abdominal pain. She's been maintaining her weight although she is trying to lose weight and she had lost about 10 pounds over the last year but then over the holiday she did gain some of her weight back. She has started going to [employer] is hoped that the hopes that she will be able to get some weight off of her. Remainder of the 10 point review of systems is negative.  MEDICAL HISTORY: Past Medical History  Diagnosis Date   . Hypertension   . Cancer     colon  . GERD (gastroesophageal reflux disease)   . Hyperlipidemia   . Arthritis   . Neuromuscular disorder     back, hip, leg left side  . Diabetes mellitus   . Spinal stenosis     ALLERGIES:  is allergic to aleve and tape.  MEDICATIONS:  Current Outpatient Prescriptions  Medication Sig Dispense Refill  . acetaminophen (TYLENOL) 500 MG tablet Take 500 mg by mouth every 6 (six) hours as needed. 1-2 tablets q 4-6 hrs              . betamethasone dipropionate (DIPROLENE) 0.05 % cream Apply topically 1 day or 1 dose.        . bisoprolol (ZEBETA) 5 MG tablet       . Calcium Carbonate-Vitamin D (CALCIUM-VITAMIN D) 500-200 MG-UNIT per tablet Take 1 tablet by mouth 1 day or 1 dose.        . docusate sodium (COLACE) 100 MG capsule Take 100 mg by mouth 2 (two) times daily.        Marland Kitchen doxycycline (VIBRA-TABS) 100 MG tablet       . hydrochlorothiazide (HYDRODIURIL) 25 MG tablet       . ibuprofen (ADVIL,MOTRIN) 200 MG tablet Take 200 mg by mouth every 6 (six) hours as needed.        Boris Lown Oil 300 MG CAPS Take by mouth daily.        . multivitamin (THERAGRAN) per tablet Take 1 tablet by mouth daily.        Marland Kitchen  omeprazole (PRILOSEC) 20 MG capsule Take 20 mg by mouth daily.        Marland Kitchen omeprazole-sodium bicarbonate (ZEGERID) 40-1100 MG per capsule Take 1 capsule by mouth daily before breakfast.        . oxybutynin (DITROPAN) 5 MG tablet Take 5 mg by mouth 2 (two) times daily.        . simvastatin (ZOCOR) 40 MG tablet Take 40 mg by mouth at bedtime.        . tamoxifen (NOLVADEX) 20 MG tablet Take 1 tablet (20 mg total) by mouth daily.  30 tablet  3  . tamoxifen (NOLVADEX) 20 MG tablet Take 1 tablet (20 mg total) by mouth daily.  90 tablet  12  . vitamin E 400 UNIT capsule Take 400 Units by mouth daily.          SURGICAL HISTORY:  Past Surgical History  Procedure Date  . Breast surgery     biopsy  . Abdominal hysterectomy   . Colon surgery     partial  colectomy  . X-stop implantation September 26th,2012    REVIEW OF SYSTEMS:  Pertinent items are noted in HPI.   PHYSICAL EXAMINATION: General appearance: alert, cooperative and appears stated age Neck: no adenopathy, no carotid bruit, no JVD, supple, symmetrical, trachea midline and thyroid not enlarged, symmetric, no tenderness/mass/nodules Lymph nodes: Cervical, supraclavicular, and axillary nodes normal. Resp: clear to auscultation bilaterally and normal percussion bilaterally Back: symmetric, no curvature. ROM normal. No CVA tenderness. Cardio: regular rate and rhythm, S1, S2 normal, no murmur, click, rub or gallop GI: soft, non-tender; bowel sounds normal; no masses,  no organomegaly Extremities: extremities normal, atraumatic, no cyanosis or edema Neurologic: Alert and oriented X 3, normal strength and tone. Normal symmetric reflexes. Normal coordination and gait Bilateral breast examination is performed right breast reveals a well-healed surgical scar there are no masses nipple discharge or retractions. Left breast no masses or nipple discharge ECOG PERFORMANCE STATUS: 1 - Symptomatic but completely ambulatory  Blood pressure 127/77, pulse 69, temperature 98.2 F (36.8 C), temperature source Oral, height 5\' 4"  (1.626 m), weight 275 lb 11.2 oz (125.057 kg).  LABORATORY DATA: Lab Results  Component Value Date   WBC 4.1 07/21/2011   HGB 11.6 07/21/2011   HCT 34.3* 07/21/2011   MCV 90.0 07/21/2011   PLT 184 07/21/2011      Chemistry      Component Value Date/Time   NA 139 07/21/2011 1019   K 4.4 07/21/2011 1019   CL 103 07/21/2011 1019   CO2 28 07/21/2011 1019   BUN 20 07/21/2011 1019   CREATININE 0.86 07/21/2011 1019      Component Value Date/Time   CALCIUM 8.9 07/21/2011 1019   ALKPHOS 52 07/21/2011 1019   AST 12 07/21/2011 1019   ALT 12 07/21/2011 1019   BILITOT 0.2* 07/21/2011 1019       RADIOGRAPHIC STUDIES:  No results found.  ASSESSMENT: 71 year old female with  #1 stage I invasive  ductal carcinoma of the right breast diagnosed April 2012. Patient underwent a lumpectomy followed by radiation therapy and then she was laced on tamoxifen starting October 2012. Overall she is tolerating this quite well.  #2 back surgery patient has healed well from this.  #3 weight gain patient has joined [employer].   PLAN:   #1 we discussed patient's risks and benefits of tamoxifen she understands that she will continue the tamoxifen for a duration of 5 years.  #2 patient and I discussed  survivorship. She understands exercise and help maintaining a healthy diet and healthy weight are extremely important in reducing the risk of recurrence of disease. She is planning on joining the [employer] in do some aerobic exercises so that she can get back into shape as she will continue to be in [employer] for ongoing weight control.  #3 patient will be seen back in 6 months time for followup.   All questions were answered. The patient knows to call the clinic with any problems, questions or concerns. We can certainly see the patient much sooner if necessary.  I spent 25 minutes counseling the patient face to face. The total time spent in the appointment was 30 minutes.    Drue Second, MD Medical/Oncology [facility] [phone] (beeper) [phone] (Office)  07/21/2011, 10:55 PM

## 2011-09-08 ENCOUNTER — Telehealth: Payer: Self-pay | Admitting: Oncology

## 2011-09-08 NOTE — Telephone Encounter (Signed)
S/w the pt regarding her aug 7th appts had to be cancelled and r/s to 01/26/2012 due to the md will be on vac week of 8/7th/2013

## 2011-09-10 ENCOUNTER — Encounter (INDEPENDENT_AMBULATORY_CARE_PROVIDER_SITE_OTHER): Payer: Self-pay | Admitting: General Surgery

## 2011-09-10 ENCOUNTER — Ambulatory Visit (INDEPENDENT_AMBULATORY_CARE_PROVIDER_SITE_OTHER): Payer: Medicare Other | Admitting: General Surgery

## 2011-09-10 VITALS — BP 162/78 | HR 69 | Temp 97.0°F | Ht 64.5 in | Wt 277.4 lb

## 2011-09-10 DIAGNOSIS — Z853 Personal history of malignant neoplasm of breast: Secondary | ICD-10-CM

## 2011-09-10 NOTE — Progress Notes (Signed)
Operation: Right partial mastectomy and sentinel lymph node biopsy  Date: Oct 28, 2010  Stage: Stage I  Hormone receptor status: Positive  HPI:Kathleen [name] is here for follow up of her right breast cancer. She denies any nodules in her right breast. She denies any swelling under her arm or in her neck.  She is tolerating the Tamoxifen fairly well.  She states she developed a right breast infection that responded to antibiotics.  PE: Gen.-overweight in no acute distress.  Right breast-well-healed medial scar with some radiation changes but no dominant masses.  Lymph nodes-no palpable right axillary, supraclavicular, or cervical adenopathy.  Assessment:  Stage I right breast cancer-no clinical evidence of recurrence. Mammogram due in May or June.  Plan: Return visit 3 months.

## 2011-09-10 NOTE — Patient Instructions (Signed)
Call if you find any masses in your breasts.

## 2011-10-21 ENCOUNTER — Other Ambulatory Visit (INDEPENDENT_AMBULATORY_CARE_PROVIDER_SITE_OTHER): Payer: Self-pay | Admitting: General Surgery

## 2011-10-21 DIAGNOSIS — Z9889 Other specified postprocedural states: Secondary | ICD-10-CM

## 2011-10-21 DIAGNOSIS — Z853 Personal history of malignant neoplasm of breast: Secondary | ICD-10-CM

## 2011-12-01 ENCOUNTER — Ambulatory Visit
Admission: RE | Admit: 2011-12-01 | Discharge: 2011-12-01 | Disposition: A | Discharge status: Home / Self Care | Payer: Medicare Other | Source: Ambulatory Visit | Attending: General Surgery | Admitting: General Surgery

## 2011-12-01 DIAGNOSIS — Z9889 Other specified postprocedural states: Secondary | ICD-10-CM

## 2011-12-01 DIAGNOSIS — Z853 Personal history of malignant neoplasm of breast: Secondary | ICD-10-CM

## 2011-12-11 ENCOUNTER — Ambulatory Visit (INDEPENDENT_AMBULATORY_CARE_PROVIDER_SITE_OTHER): Payer: Medicare Other | Admitting: General Surgery

## 2011-12-11 ENCOUNTER — Encounter (INDEPENDENT_AMBULATORY_CARE_PROVIDER_SITE_OTHER): Payer: Self-pay | Admitting: General Surgery

## 2011-12-11 VITALS — BP 128/81 | HR 74 | Temp 97.3°F | Ht 64.5 in | Wt 273.8 lb

## 2011-12-11 DIAGNOSIS — Z853 Personal history of malignant neoplasm of breast: Secondary | ICD-10-CM

## 2011-12-11 NOTE — Patient Instructions (Signed)
Call if you feel any new masses in your breasts. 

## 2011-12-11 NOTE — Progress Notes (Signed)
Operation: Right partial mastectomy and sentinel lymph node biopsy  Date: Oct 28, 2010  Stage: Stage I  Hormone receptor status: Positive  HPI:Kathleen [name] is here for follow up of her right breast cancer. She denies any masses in her breasts. She denies any swollen nodules under her arm or in her neck.  She is tolerating the Tamoxifen fairly well and has occasional hot flashes.  Mammogram was a  BIRADS 2.  PE: Gen.-overweight in no acute distress.  Right breast-well-healed medial scar with some radiation changes to the skin, no masses.  Left breast-no palpable masses or suspicious changes.  Lymph nodes-no palpable right axillary, supraclavicular, or cervical adenopathy.  Assessment:  Stage I right breast cancer-no clinical evidence of recurrence.  Plan: Return visit 3 months.

## 2012-01-21 ENCOUNTER — Ambulatory Visit: Payer: Medicare Other | Admitting: Oncology

## 2012-01-21 ENCOUNTER — Other Ambulatory Visit: Payer: Medicare Other | Admitting: Lab

## 2012-01-26 ENCOUNTER — Encounter: Payer: Self-pay | Admitting: Oncology

## 2012-01-26 ENCOUNTER — Other Ambulatory Visit (HOSPITAL_BASED_OUTPATIENT_CLINIC_OR_DEPARTMENT_OTHER): Payer: Medicare Other | Admitting: Lab

## 2012-01-26 ENCOUNTER — Ambulatory Visit (HOSPITAL_BASED_OUTPATIENT_CLINIC_OR_DEPARTMENT_OTHER): Payer: Medicare Other | Admitting: Oncology

## 2012-01-26 ENCOUNTER — Telehealth: Payer: Self-pay | Admitting: Oncology

## 2012-01-26 VITALS — BP 157/80 | HR 68 | Temp 98.3°F | Resp 20 | Ht 64.5 in | Wt 272.2 lb

## 2012-01-26 DIAGNOSIS — C50919 Malignant neoplasm of unspecified site of unspecified female breast: Secondary | ICD-10-CM

## 2012-01-26 DIAGNOSIS — Z170 Estrogen receptor positive status [ER+]: Secondary | ICD-10-CM

## 2012-01-26 LAB — CBC WITH DIFFERENTIAL/PLATELET
Basophils Absolute: 0 10*3/uL (ref 0.0–0.1)
Eosinophils Absolute: 0.3 10*3/uL (ref 0.0–0.5)
HGB: 11.6 g/dL (ref 11.6–15.9)
LYMPH%: 29.9 % (ref 14.0–49.7)
MCV: 90.9 fL (ref 79.5–101.0)
MONO%: 8 % (ref 0.0–14.0)
NEUT#: 3 10*3/uL (ref 1.5–6.5)
Platelets: 234 10*3/uL (ref 145–400)
RBC: 3.8 10*6/uL (ref 3.70–5.45)

## 2012-01-26 LAB — COMPREHENSIVE METABOLIC PANEL
Alkaline Phosphatase: 44 U/L (ref 39–117)
BUN: 15 mg/dL (ref 6–23)
Glucose, Bld: 132 mg/dL — ABNORMAL HIGH (ref 70–99)
Total Bilirubin: 0.3 mg/dL (ref 0.3–1.2)

## 2012-01-26 NOTE — Telephone Encounter (Signed)
gve the pt her feb 2014 appt calendar °

## 2012-01-26 NOTE — Patient Instructions (Addendum)
Continue tamoxifen daily  I will see you back in 6 months 

## 2012-01-26 NOTE — Progress Notes (Signed)
OFFICE PROGRESS NOTE  CC  Johny Blamer, MD [facility] [address] [place] [address] Dr. Avel Peace Dr. Chipper Herb  DIAGNOSIS: 71 year old female with stage I (T1 C. N0) invasive ductal carcinoma of the right breast diagnosed April 2012.  PRIOR THERAPY:  #1 status post lumpectomy of the right breast on 10/28/2010 with the final pathology revealing a 1.8 cm invasive ductal carcinoma grade 2 with associated DCIS. Tumor was ER +100% PR +100% proliferation marker 26% HER-2/neu negative. 2 sentinel nodes were negative for metastatic disease.  #2 Oncotype DX was performed and the score was 0.  #3 patient underwent radiation therapy completed between 12/10/2010 2 01/28/2011.  #4 patient is status post surgery of her back for spinal stenosis on 03/12/2011.  #5 patient was then begun on adjuvant tamoxifen 20 mg daily this was started on 04/14/2011.  CURRENT THERAPY: Tamoxifen 20 mg daily. Since 04/14/2011  INTERVAL HISTORY: [place] 71 y.o. female returns for followup visit today. Overall she seems to be doing well. She is really not bothered by the tamoxifen at all. She is denying any headaches double vision blurring of vision difficulty in swallowing she has not experienced any psychomotor issues with tamoxifen no hot flashes no myalgias or arthralgias she has no shortness of breath chest pains palpitations she denies having any vaginal bleeding or discharge. She has no myalgias or arthralgias no abdominal pain. She Did develop what sounds like mastitis she was treated with oral antibiotics this is healed. She now has a urinary tract infection she is on Cipro. I did recommend that she take some probiotics help prevent any yeast infections. Patient is also suffering from allergies and she has been taking her usual antiallergy and medication at the encouraged her to continue taking this. Remainder of the 10 point review of systems is  negative.  MEDICAL HISTORY: Past Medical History  Diagnosis Date  . Hypertension   . Cancer     colon  . GERD (gastroesophageal reflux disease)   . Hyperlipidemia   . Arthritis   . Neuromuscular disorder     back, hip, leg left side  . Diabetes mellitus   . Spinal stenosis     ALLERGIES:  is allergic to naproxen sodium; tape; and doxycycline.  MEDICATIONS:  Current Outpatient Prescriptions  Medication Sig Dispense Refill  . acetaminophen (TYLENOL) 500 MG tablet Take 500 mg by mouth every 6 (six) hours as needed. 1-2 tablets q 4-6 hrs              . aspirin 81 MG tablet Take 81 mg by mouth daily.      . betamethasone dipropionate (DIPROLENE) 0.05 % cream Apply topically 1 day or 1 dose.        . bisoprolol (ZEBETA) 5 MG tablet       . Calcium Carbonate-Vitamin D (CALCIUM-VITAMIN D) 500-200 MG-UNIT per tablet Take 1 tablet by mouth 1 day or 1 dose.        . ciprofloxacin (CIPRO) 500 MG/5ML (10%) suspension Take 250 mg by mouth 2 (two) times daily.      . clotrimazole-betamethasone (LOTRISONE) cream       . docusate sodium (COLACE) 100 MG capsule Take 100 mg by mouth 2 (two) times daily.        . fexofenadine (ALLEGRA) 180 MG tablet Take 180 mg by mouth as needed.      . hydrochlorothiazide (HYDRODIURIL) 25 MG tablet       .  HYDROcodone-homatropine (HYCODAN) 5-1.5 MG/5ML syrup Take by mouth every 6 (six) hours as needed.      Marland Kitchen ibuprofen (ADVIL,MOTRIN) 200 MG tablet Take 200 mg by mouth every 6 (six) hours as needed.        Boris Lown Oil 300 MG CAPS Take by mouth daily.        . multivitamin (THERAGRAN) per tablet Take 1 tablet by mouth daily.        Marland Kitchen omeprazole (PRILOSEC) 20 MG capsule Take 20 mg by mouth daily.        Marland Kitchen omeprazole-sodium bicarbonate (ZEGERID) 40-1100 MG per capsule Take 1 capsule by mouth daily before breakfast.        . oxybutynin (DITROPAN) 5 MG tablet Take 5 mg by mouth 2 (two) times daily.        . simvastatin (ZOCOR) 40 MG tablet Take 40 mg by mouth  at bedtime.        . tamoxifen (NOLVADEX) 20 MG tablet Take 1 tablet (20 mg total) by mouth daily.  30 tablet  3  . vitamin E 400 UNIT capsule Take 400 Units by mouth daily.          SURGICAL HISTORY:  Past Surgical History  Procedure Date  . Breast surgery     biopsy  . Abdominal hysterectomy   . Colon surgery     partial colectomy  . X-stop implantation September 26th,2012    REVIEW OF SYSTEMS:  Pertinent items are noted in HPI.   PHYSICAL EXAMINATION: General appearance: alert, cooperative and appears stated age Neck: no adenopathy, no carotid bruit, no JVD, supple, symmetrical, trachea midline and thyroid not enlarged, symmetric, no tenderness/mass/nodules Lymph nodes: Cervical, supraclavicular, and axillary nodes normal. Resp: clear to auscultation bilaterally and normal percussion bilaterally Back: symmetric, no curvature. ROM normal. No CVA tenderness. Cardio: regular rate and rhythm, S1, S2 normal, no murmur, click, rub or gallop GI: soft, non-tender; bowel sounds normal; no masses,  no organomegaly Extremities: extremities normal, atraumatic, no cyanosis or edema Neurologic: Alert and oriented X 3, normal strength and tone. Normal symmetric reflexes. Normal coordination and gait Bilateral breast examination is performed right breast reveals a well-healed surgical scar there are no masses nipple discharge or retractions. Left breast no masses or nipple discharge ECOG PERFORMANCE STATUS: 1 - Symptomatic but completely ambulatory  Blood pressure 157/80, pulse 68, temperature 98.3 F (36.8 C), resp. rate 20, height 5' 4.5" (1.638 m), weight 272 lb 3.2 oz (123.469 kg).  LABORATORY DATA: Lab Results  Component Value Date   WBC 5.3 01/26/2012   HGB 11.6 01/26/2012   HCT 34.5* 01/26/2012   MCV 90.9 01/26/2012   PLT 234 01/26/2012      Chemistry      Component Value Date/Time   NA 139 07/21/2011 1019   K 4.4 07/21/2011 1019   CL 103 07/21/2011 1019   CO2 28 07/21/2011 1019   BUN  20 07/21/2011 1019   CREATININE 0.86 07/21/2011 1019      Component Value Date/Time   CALCIUM 8.9 07/21/2011 1019   ALKPHOS 52 07/21/2011 1019   AST 12 07/21/2011 1019   ALT 12 07/21/2011 1019   BILITOT 0.2* 07/21/2011 1019       RADIOGRAPHIC STUDIES:  No results found.  ASSESSMENT: 71 year old female with  #1 stage I invasive ductal carcinoma of the right breast diagnosed April 2012. Patient underwent a lumpectomy followed by radiation therapy and then she was laced on tamoxifen starting October 2012. Overall she  is tolerating this quite well.  #2 back surgery patient has healed well from this.  #3 weight gain patient has joined [employer].   PLAN:  #1 continue tamoxifen 20 mg daily.  #2 patient will try some probiotics and continue anti-allergens for her allergies.  #3 patient will be seen back in 6 months time for followup.   All questions were answered. The patient knows to call the clinic with any problems, questions or concerns. We can certainly see the patient much sooner if necessary.  I spent >15 minutes counseling the patient face to face. The total time spent in the appointment was 30 minutes.    Drue Second, MD Medical/Oncology [facility] [phone] (beeper) [phone] (Office)  01/26/2012, 11:30 AM

## 2012-03-10 ENCOUNTER — Encounter (INDEPENDENT_AMBULATORY_CARE_PROVIDER_SITE_OTHER): Payer: Self-pay | Admitting: General Surgery

## 2012-03-10 ENCOUNTER — Ambulatory Visit (INDEPENDENT_AMBULATORY_CARE_PROVIDER_SITE_OTHER): Payer: Medicare Other | Admitting: General Surgery

## 2012-03-10 VITALS — BP 138/82 | HR 66 | Temp 98.0°F | Resp 18 | Ht 64.0 in | Wt 274.8 lb

## 2012-03-10 DIAGNOSIS — Z853 Personal history of malignant neoplasm of breast: Secondary | ICD-10-CM

## 2012-03-10 NOTE — Patient Instructions (Signed)
Call if you feel any new masses in your breasts. 

## 2012-03-10 NOTE — Progress Notes (Signed)
Operation: Right partial mastectomy and sentinel lymph node biopsy  Date: Oct 28, 2010  Stage: Stage I  Hormone receptor status: Positive  HPI:Kathleen [name] is here for follow up of her right breast cancer. She denies any masses in her breasts. She denies any swollen nodules under her arm or in her neck. She did have an episode of right breast cellulitis in July treated successfully with antibiotics.  Her mammogram is due in April 2014 PE: Gen.-overweight in no acute distress.  Right breast-well-healed medial scar with some radiation changes to the skin, no masses.  Left breast-no palpable masses or suspicious changes.  Lymph nodes-no palpable right axillary, supraclavicular, or cervical adenopathy.  Assessment:  Stage I right breast cancer-no clinical evidence of recurrence.  Plan: Return visit 3 months.

## 2012-06-23 ENCOUNTER — Ambulatory Visit (INDEPENDENT_AMBULATORY_CARE_PROVIDER_SITE_OTHER): Payer: Medicare Other | Admitting: General Surgery

## 2012-06-23 VITALS — BP 180/80 | HR 60 | Resp 18 | Ht 64.5 in | Wt 276.8 lb

## 2012-06-23 DIAGNOSIS — Z853 Personal history of malignant neoplasm of breast: Secondary | ICD-10-CM

## 2012-06-23 NOTE — Patient Instructions (Signed)
Call if you feel any new masses in your breasts.

## 2012-06-23 NOTE — Progress Notes (Signed)
Operation: Right partial mastectomy and sentinel lymph node biopsy  Date: Oct 28, 2010  Stage: Stage I  Hormone receptor status: Positive  HPI:Kathleen [name] is here for another long-term follow up of her right breast cancer. She denies any masses in her breasts. She denies any swollen nodules under her arm or in her neck.   She is having some discomfort in her ears.  She is taking Tamoxifen.  Her mammogram is due in April 2014 PE: Gen.-overweight in no acute distress.  Ears-clear, no fluid or erythema  Right breast-well-healed medial scar with some radiation changes to the skin, no masses.  Left breast-no palpable masses or suspicious changes.  Lymph nodes-no palpable right axillary, supraclavicular, or cervical adenopathy.  Assessment:  Stage I right breast cancer-no clinical evidence of recurrence.  Plan: Return visit 3 months.

## 2012-07-29 ENCOUNTER — Ambulatory Visit: Payer: Medicare Other | Admitting: Oncology

## 2012-07-29 ENCOUNTER — Other Ambulatory Visit: Payer: Medicare Other | Admitting: Lab

## 2012-09-15 ENCOUNTER — Telehealth: Payer: Self-pay | Admitting: *Deleted

## 2012-09-15 NOTE — Telephone Encounter (Signed)
Lm vm informing pt that KK will be off of the office on 09/30/12. gv new appt d/t for 12/24/12@12noon .also made pt aware that i will mail her a letter/cal.

## 2012-09-27 ENCOUNTER — Telehealth (INDEPENDENT_AMBULATORY_CARE_PROVIDER_SITE_OTHER): Payer: Self-pay

## 2012-09-27 ENCOUNTER — Ambulatory Visit (INDEPENDENT_AMBULATORY_CARE_PROVIDER_SITE_OTHER): Payer: Medicare Other | Admitting: General Surgery

## 2012-09-27 ENCOUNTER — Encounter (INDEPENDENT_AMBULATORY_CARE_PROVIDER_SITE_OTHER): Payer: Self-pay | Admitting: General Surgery

## 2012-09-27 ENCOUNTER — Other Ambulatory Visit: Payer: Self-pay | Admitting: *Deleted

## 2012-09-27 VITALS — BP 134/82 | HR 68 | Temp 98.0°F | Ht 64.0 in | Wt 278.8 lb

## 2012-09-27 DIAGNOSIS — C50911 Malignant neoplasm of unspecified site of right female breast: Secondary | ICD-10-CM

## 2012-09-27 DIAGNOSIS — Z853 Personal history of malignant neoplasm of breast: Secondary | ICD-10-CM

## 2012-09-27 MED ORDER — TAMOXIFEN CITRATE 20 MG PO TABS: 20.0000 mg | ORAL_TABLET | Freq: Every day | ORAL | Status: DC

## 2012-09-27 NOTE — Telephone Encounter (Signed)
Called pt to notify her of the [facility]m appt [facility] on 12/06/12 @10 :30. The pt understands.

## 2012-09-27 NOTE — Progress Notes (Signed)
Operation: Right partial mastectomy and sentinel lymph node biopsy  Date: Oct 28, 2010  Stage: Stage I  Hormone receptor status: Positive  HPI:Kathleen [name] is here for another long-term follow up of her right breast cancer. She denies any masses in her breasts. She denies any swollen nodules under her arm or in her neck.   She is having some discomfort in her ears.  She is taking Tamoxifen and tolerating it well.   PE: Gen.-overweight in no acute distress.  Right breast-well-healed medial scar with some radiation changes to the skin, no masses.  Left breast-no palpable masses or suspicious changes.  Lymph nodes-no palpable right axillary, supraclavicular, or cervical adenopathy.  Assessment:  Stage I right breast cancer-no clinical evidence of recurrence.  Plan: Schedule mammogram.  Return visit in 6 months.

## 2012-09-27 NOTE — Addendum Note (Signed)
Addended by: Milas Hock on: 09/27/2012 10:30 AM   Modules accepted: Orders

## 2012-09-27 NOTE — Patient Instructions (Signed)
Call if you find any new lumps in your breasts. 

## 2012-09-30 ENCOUNTER — Other Ambulatory Visit: Payer: Self-pay | Admitting: Lab

## 2012-09-30 ENCOUNTER — Ambulatory Visit: Payer: Self-pay | Admitting: Oncology

## 2012-12-06 ENCOUNTER — Ambulatory Visit
Admission: RE | Admit: 2012-12-06 | Discharge: 2012-12-06 | Disposition: A | Discharge status: Home / Self Care | Payer: Medicare Other | Source: Ambulatory Visit | Attending: General Surgery | Admitting: General Surgery

## 2012-12-06 DIAGNOSIS — Z853 Personal history of malignant neoplasm of breast: Secondary | ICD-10-CM

## 2012-12-24 ENCOUNTER — Encounter: Payer: Self-pay | Admitting: Oncology

## 2012-12-24 ENCOUNTER — Other Ambulatory Visit (HOSPITAL_BASED_OUTPATIENT_CLINIC_OR_DEPARTMENT_OTHER): Payer: Medicare Other | Admitting: Lab

## 2012-12-24 ENCOUNTER — Ambulatory Visit (HOSPITAL_BASED_OUTPATIENT_CLINIC_OR_DEPARTMENT_OTHER): Payer: Medicare Other | Admitting: Oncology

## 2012-12-24 ENCOUNTER — Telehealth: Payer: Self-pay | Admitting: Oncology

## 2012-12-24 VITALS — BP 146/77 | HR 67 | Temp 98.1°F | Resp 20 | Ht 64.0 in | Wt 278.4 lb

## 2012-12-24 DIAGNOSIS — C50911 Malignant neoplasm of unspecified site of right female breast: Secondary | ICD-10-CM

## 2012-12-24 DIAGNOSIS — Z170 Estrogen receptor positive status [ER+]: Secondary | ICD-10-CM

## 2012-12-24 DIAGNOSIS — C50919 Malignant neoplasm of unspecified site of unspecified female breast: Secondary | ICD-10-CM

## 2012-12-24 DIAGNOSIS — C50219 Malignant neoplasm of upper-inner quadrant of unspecified female breast: Secondary | ICD-10-CM

## 2012-12-24 LAB — CBC WITH DIFFERENTIAL/PLATELET
BASO%: 0.7 % (ref 0.0–2.0)
EOS%: 3.1 % (ref 0.0–7.0)
HCT: 34.8 % (ref 34.8–46.6)
LYMPH%: 23.7 % (ref 14.0–49.7)
MCH: 30.4 pg (ref 25.1–34.0)
MCHC: 33.8 g/dL (ref 31.5–36.0)
NEUT%: 65.9 % (ref 38.4–76.8)
lymph#: 1.8 10*3/uL (ref 0.9–3.3)

## 2012-12-24 LAB — COMPREHENSIVE METABOLIC PANEL (CC13)
ALT: 14 U/L (ref 0–55)
AST: 14 U/L (ref 5–34)
Chloride: 103 mEq/L (ref 98–109)
Creatinine: 0.8 mg/dL (ref 0.6–1.1)
Total Bilirubin: <0.2 mg/dL (ref 0.20–1.20)

## 2012-12-24 MED ORDER — TAMOXIFEN CITRATE 20 MG PO TABS: 20.0000 mg | ORAL_TABLET | Freq: Every day | ORAL | Status: DC

## 2012-12-24 NOTE — Telephone Encounter (Signed)
, °

## 2012-12-24 NOTE — Patient Instructions (Addendum)
Doing well, no evidence of cancer  Continue tamoxifen 20 mg daily  I will see you back in 6 months

## 2012-12-24 NOTE — Progress Notes (Signed)
OFFICE PROGRESS NOTE  CC  Johny Blamer, MD [facility] [address] [place] [address] Dr. Avel Peace Dr. Chipper Herb  DIAGNOSIS: 72 year old female with stage I (T1 C. N0) invasive ductal carcinoma of the right breast diagnosed April 2012.  PRIOR THERAPY:  #1 status post lumpectomy of the right breast on 10/28/2010 with the final pathology revealing a 1.8 cm invasive ductal carcinoma grade 2 with associated DCIS. Tumor was ER +100% PR +100% proliferation marker 26% HER-2/neu negative. 2 sentinel nodes were negative for metastatic disease.  #2 Oncotype DX was performed and the score was 0.  #3 patient underwent radiation therapy completed between 12/10/2010 2 01/28/2011.  #4 patient is status post surgery of her back for spinal stenosis on 03/12/2011.  #5 patient was then begun on adjuvant tamoxifen 20 mg daily this was started on 04/14/2011.  CURRENT THERAPY: Tamoxifen 20 mg daily. Since 04/14/2011  INTERVAL HISTORY: [place] 72 y.o. female returns for followup visit today. Overall she seems to be doing well. She is really not bothered by the tamoxifen at all. She is denying any headaches double vision blurring of vision difficulty in swallowing she has not experienced any psychomotor issues with tamoxifen no hot flashes no myalgias or arthralgias she has no shortness of breath chest pains palpitations she denies having any vaginal bleeding or discharge. She has no myalgias or arthralgias no abdominal pain.Patient is also suffering from allergies and she has been taking her usual antiallergy and medication at the encouraged her to continue taking this. Remainder of the 10 point review of systems is negative.  MEDICAL HISTORY: Past Medical History  Diagnosis Date  . Hypertension   . Cancer     colon  . GERD (gastroesophageal reflux disease)   . Hyperlipidemia   . Arthritis   . Neuromuscular disorder     back, hip, leg left  side  . Diabetes mellitus   . Spinal stenosis     ALLERGIES:  is allergic to mobic; naproxen sodium; tape; and doxycycline.  MEDICATIONS:  Current Outpatient Prescriptions  Medication Sig Dispense Refill  . acetaminophen (TYLENOL) 500 MG tablet Take 500 mg by mouth every 6 (six) hours as needed. 1-2 tablets q 4-6 hrs              . aspirin 81 MG tablet Take 81 mg by mouth daily.      . betamethasone dipropionate (DIPROLENE) 0.05 % cream Apply topically 1 day or 1 dose.        . bisoprolol (ZEBETA) 5 MG tablet       . Calcium Carbonate-Vitamin D (CALCIUM-VITAMIN D) 500-200 MG-UNIT per tablet Take 1 tablet by mouth 1 day or 1 dose.        . docusate sodium (COLACE) 100 MG capsule Take 100 mg by mouth 2 (two) times daily.        . fexofenadine (ALLEGRA) 180 MG tablet Take 180 mg by mouth daily.       . hydrochlorothiazide (HYDRODIURIL) 25 MG tablet       . ibuprofen (ADVIL,MOTRIN) 200 MG tablet Take 200 mg by mouth every 6 (six) hours as needed.        Boris Lown Oil 300 MG CAPS Take by mouth daily.        Marland Kitchen omeprazole (PRILOSEC) 20 MG capsule Take 20 mg by mouth daily.        Marland Kitchen omeprazole-sodium bicarbonate (ZEGERID) 40-1100 MG per capsule Take 1 capsule by  mouth daily before breakfast.        . oxybutynin (DITROPAN) 5 MG tablet Take 5 mg by mouth 2 (two) times daily.        . simvastatin (ZOCOR) 40 MG tablet Take 40 mg by mouth at bedtime.        . tamoxifen (NOLVADEX) 20 MG tablet Take 1 tablet (20 mg total) by mouth daily.  90 tablet  1  . ciprofloxacin (CIPRO) 500 MG/5ML (10%) suspension Take 250 mg by mouth 2 (two) times daily.      . clotrimazole-betamethasone (LOTRISONE) cream       . HYDROcodone-homatropine (HYCODAN) 5-1.5 MG/5ML syrup Take by mouth every 6 (six) hours as needed.       No current facility-administered medications for this visit.    SURGICAL HISTORY:  Past Surgical History  Procedure Laterality Date  . Breast surgery      biopsy  . Abdominal hysterectomy     . Colon surgery      partial colectomy  . X-stop implantation  September 26th,2012    REVIEW OF SYSTEMS:  Pertinent items are noted in HPI.   PHYSICAL EXAMINATION: General appearance: alert, cooperative and appears stated age Neck: no adenopathy, no carotid bruit, no JVD, supple, symmetrical, trachea midline and thyroid not enlarged, symmetric, no tenderness/mass/nodules Lymph nodes: Cervical, supraclavicular, and axillary nodes normal. Resp: clear to auscultation bilaterally and normal percussion bilaterally Back: symmetric, no curvature. ROM normal. No CVA tenderness. Cardio: regular rate and rhythm, S1, S2 normal, no murmur, click, rub or gallop GI: soft, non-tender; bowel sounds normal; no masses,  no organomegaly Extremities: extremities normal, atraumatic, no cyanosis or edema Neurologic: Alert and oriented X 3, normal strength and tone. Normal symmetric reflexes. Normal coordination and gait Bilateral breast examination is performed right breast reveals a well-healed surgical scar there are no masses nipple discharge or retractions. Left breast no masses or nipple discharge ECOG PERFORMANCE STATUS: 1 - Symptomatic but completely ambulatory  Blood pressure 146/77, pulse 67, temperature 98.1 F (36.7 C), temperature source Oral, resp. rate 20, height 5\' 4"  (1.626 m), weight 278 lb 6.4 oz (126.281 kg).  LABORATORY DATA: Lab Results  Component Value Date   WBC 7.7 12/24/2012   HGB 11.8 12/24/2012   HCT 34.8 12/24/2012   MCV 89.9 12/24/2012   PLT 213 12/24/2012      Chemistry      Component Value Date/Time   NA 139 12/24/2012 1052   NA 140 01/26/2012 1022   K 4.1 12/24/2012 1052   K 4.3 01/26/2012 1022   CL 101 01/26/2012 1022   CO2 28 12/24/2012 1052   CO2 30 01/26/2012 1022   BUN 17.1 12/24/2012 1052   BUN 15 01/26/2012 1022   CREATININE 0.8 12/24/2012 1052   CREATININE 0.72 01/26/2012 1022      Component Value Date/Time   CALCIUM 9.3 12/24/2012 1052   CALCIUM 9.3 01/26/2012  1022   ALKPHOS 54 12/24/2012 1052   ALKPHOS 44 01/26/2012 1022   AST 14 12/24/2012 1052   AST 17 01/26/2012 1022   ALT 14 12/24/2012 1052   ALT 16 01/26/2012 1022   BILITOT <0.20 Repeated and Verified 12/24/2012 1052   BILITOT 0.3 01/26/2012 1022       RADIOGRAPHIC STUDIES:  No results found.  ASSESSMENT: 72 year old female with  #1 stage I invasive ductal carcinoma of the right breast diagnosed April 2012. Patient underwent a lumpectomy followed by radiation therapy and then she was laced on tamoxifen starting October  2012. Overall she is tolerating this quite well.  #2 back surgery patient has healed well from this.  #3 weight gain patient has joined [employer].   PLAN:  #1 continue tamoxifen 20 mg daily.  #2 patient will be seen back in 6 months time for followup.   All questions were answered. The patient knows to call the clinic with any problems, questions or concerns. We can certainly see the patient much sooner if necessary.  I spent 25 minutes counseling the patient face to face. The total time spent in the appointment was 30 minutes.    Drue Second, MD Medical/Oncology [facility] [phone] (beeper) [phone] (Office)  12/24/2012, 1:14 PM

## 2013-06-24 ENCOUNTER — Encounter: Payer: Self-pay | Admitting: Oncology

## 2013-06-24 ENCOUNTER — Other Ambulatory Visit (HOSPITAL_BASED_OUTPATIENT_CLINIC_OR_DEPARTMENT_OTHER): Payer: Medicare Other

## 2013-06-24 ENCOUNTER — Telehealth: Payer: Self-pay | Admitting: Oncology

## 2013-06-24 ENCOUNTER — Ambulatory Visit (HOSPITAL_BASED_OUTPATIENT_CLINIC_OR_DEPARTMENT_OTHER): Payer: Medicare Other | Admitting: Oncology

## 2013-06-24 VITALS — BP 155/83 | HR 85 | Temp 98.2°F | Resp 18 | Ht 64.0 in | Wt 274.6 lb

## 2013-06-24 DIAGNOSIS — C50911 Malignant neoplasm of unspecified site of right female breast: Secondary | ICD-10-CM

## 2013-06-24 DIAGNOSIS — C50419 Malignant neoplasm of upper-outer quadrant of unspecified female breast: Secondary | ICD-10-CM

## 2013-06-24 DIAGNOSIS — Z170 Estrogen receptor positive status [ER+]: Secondary | ICD-10-CM

## 2013-06-24 LAB — COMPREHENSIVE METABOLIC PANEL (CC13)
ALBUMIN: 3.6 g/dL (ref 3.5–5.0)
ALK PHOS: 73 U/L (ref 40–150)
ALT: 17 U/L (ref 0–55)
AST: 16 U/L (ref 5–34)
Anion Gap: 12 mEq/L — ABNORMAL HIGH (ref 3–11)
BUN: 15.4 mg/dL (ref 7.0–26.0)
CO2: 25 mEq/L (ref 22–29)
CREATININE: 1.1 mg/dL (ref 0.6–1.1)
Calcium: 9.3 mg/dL (ref 8.4–10.4)
Chloride: 104 mEq/L (ref 98–109)
Glucose: 183 mg/dl — ABNORMAL HIGH (ref 70–140)
POTASSIUM: 3.9 meq/L (ref 3.5–5.1)
Sodium: 140 mEq/L (ref 136–145)
Total Protein: 7.5 g/dL (ref 6.4–8.3)

## 2013-06-24 LAB — CBC WITH DIFFERENTIAL/PLATELET
BASO%: 0.3 % (ref 0.0–2.0)
BASOS ABS: 0 10*3/uL (ref 0.0–0.1)
EOS%: 2.4 % (ref 0.0–7.0)
Eosinophils Absolute: 0.2 10*3/uL (ref 0.0–0.5)
HCT: 35.8 % (ref 34.8–46.6)
HEMOGLOBIN: 11.7 g/dL (ref 11.6–15.9)
LYMPH%: 28.8 % (ref 14.0–49.7)
MCH: 29.3 pg (ref 25.1–34.0)
MCHC: 32.7 g/dL (ref 31.5–36.0)
MCV: 89.5 fL (ref 79.5–101.0)
MONO#: 0.3 10*3/uL (ref 0.1–0.9)
MONO%: 4.1 % (ref 0.0–14.0)
NEUT#: 4.6 10*3/uL (ref 1.5–6.5)
NEUT%: 64.4 % (ref 38.4–76.8)
Platelets: 304 10*3/uL (ref 145–400)
RBC: 4 10*6/uL (ref 3.70–5.45)
RDW: 12.9 % (ref 11.2–14.5)
WBC: 7.1 10*3/uL (ref 3.9–10.3)
lymph#: 2.1 10*3/uL (ref 0.9–3.3)

## 2013-06-24 NOTE — Progress Notes (Signed)
OFFICE PROGRESS NOTE  CC  [name], MD [address] [place] [address] Dr. Jackolyn Confer Dr. Arloa Koh  DIAGNOSIS: 73 year old female with stage I (T1 C. N0) invasive ductal carcinoma of the right breast diagnosed April 2012.  PRIOR THERAPY:  #1 status post lumpectomy of the right breast on 10/28/2010 with the final pathology revealing a 1.8 cm invasive ductal carcinoma grade 2 with associated DCIS. Tumor was ER +100% PR +100% proliferation marker 26% HER-2/neu negative. 2 sentinel nodes were negative for metastatic disease.  #2 Oncotype DX was performed and the score was 0.  #3 patient underwent radiation therapy completed between 12/10/2010 2 01/28/2011.  #4 patient is status post surgery of her back for spinal stenosis on 03/12/2011.  #5 patient was then begun on adjuvant tamoxifen 20 mg daily this was started on 04/14/2011.  CURRENT THERAPY: Tamoxifen 20 mg daily. Since 04/14/2011  INTERVAL HISTORY: [name] 73 y.o. female returns for followup visit today. Overall she seems to be doing well.  She is denying any headaches double vision blurring of vision difficulty in swallowing she has not experienced any psychomotor issues with tamoxifen no hot flashes no myalgias or arthralgias she has no shortness of breath chest pains palpitations she denies having any vaginal bleeding or discharge. She has no myalgias or arthralgias no abdominal pain Remainder of the 10 point review of systems is negative.  MEDICAL HISTORY: Past Medical History  Diagnosis Date  . Hypertension   . Cancer     colon  . GERD (gastroesophageal reflux disease)   . Hyperlipidemia   . Arthritis   . Neuromuscular disorder     back, hip, leg left side  . Diabetes mellitus   . Spinal stenosis     ALLERGIES:  is allergic to mobic; naproxen sodium; tape; and doxycycline.  MEDICATIONS:  Current Outpatient Prescriptions  Medication Sig Dispense Refill  .  acetaminophen (TYLENOL) 500 MG tablet Take 500 mg by mouth every 6 (six) hours as needed. 1-2 tablets q 4-6 hrs              . aspirin 81 MG tablet Take 81 mg by mouth daily.      . betamethasone dipropionate (DIPROLENE) 0.05 % cream Apply topically 1 day or 1 dose.        . bisoprolol (ZEBETA) 5 MG tablet       . Calcium Carbonate-Vitamin D (CALCIUM-VITAMIN D) 500-200 MG-UNIT per tablet Take 1 tablet by mouth 1 day or 1 dose.        . ciprofloxacin (CIPRO) 500 MG/5ML (10%) suspension Take 250 mg by mouth 2 (two) times daily.      . clotrimazole-betamethasone (LOTRISONE) cream       . docusate sodium (COLACE) 100 MG capsule Take 100 mg by mouth 2 (two) times daily.        . fexofenadine (ALLEGRA) 180 MG tablet Take 180 mg by mouth daily.       . hydrochlorothiazide (HYDRODIURIL) 25 MG tablet       . HYDROcodone-homatropine (HYCODAN) 5-1.5 MG/5ML syrup Take by mouth every 6 (six) hours as needed.      Marland Kitchen ibuprofen (ADVIL,MOTRIN) 200 MG tablet Take 200 mg by mouth every 6 (six) hours as needed.        Javier Docker Oil 300 MG CAPS Take by mouth daily.        Marland Kitchen omeprazole (PRILOSEC) 20 MG capsule Take 20 mg by mouth daily.        Marland Kitchen  omeprazole-sodium bicarbonate (ZEGERID) 40-1100 MG per capsule Take 1 capsule by mouth daily before breakfast.        . oxybutynin (DITROPAN) 5 MG tablet Take 5 mg by mouth 2 (two) times daily.        . simvastatin (ZOCOR) 40 MG tablet Take 40 mg by mouth at bedtime.        . sulfamethoxazole-trimethoprim (BACTRIM DS,SEPTRA DS) 800-160 MG per tablet Take 1 tablet by mouth 2 (two) times daily.      . tamoxifen (NOLVADEX) 20 MG tablet Take 1 tablet (20 mg total) by mouth daily.  90 tablet  12   No current facility-administered medications for this visit.    SURGICAL HISTORY:  Past Surgical History  Procedure Laterality Date  . Breast surgery      biopsy  . Abdominal hysterectomy    . Colon surgery      partial colectomy  . X-stop implantation  September 26th,2012     REVIEW OF SYSTEMS:  Pertinent items are noted in HPI.   PHYSICAL EXAMINATION: General appearance: alert, cooperative and appears stated age Neck: no adenopathy, no carotid bruit, no JVD, supple, symmetrical, trachea midline and thyroid not enlarged, symmetric, no tenderness/mass/nodules Lymph nodes: Cervical, supraclavicular, and axillary nodes normal. Resp: clear to auscultation bilaterally and normal percussion bilaterally Back: symmetric, no curvature. ROM normal. No CVA tenderness. Cardio: regular rate and rhythm, S1, S2 normal, no murmur, click, rub or gallop GI: soft, non-tender; bowel sounds normal; no masses,  no organomegaly Extremities: extremities normal, atraumatic, no cyanosis or edema Neurologic: Alert and oriented X 3, normal strength and tone. Normal symmetric reflexes. Normal coordination and gait Bilateral breast examination is performed right breast reveals a well-healed surgical scar there are no masses nipple discharge or retractions. Left breast no masses or nipple discharge ECOG PERFORMANCE STATUS: 1 - Symptomatic but completely ambulatory  Blood pressure 155/83, pulse 85, temperature 98.2 F (36.8 C), temperature source Oral, resp. rate 18, height 5' 4"  (1.626 m), weight 274 lb 9.6 oz (124.558 kg).  LABORATORY DATA: Lab Results  Component Value Date   WBC 7.1 06/24/2013   HGB 11.7 06/24/2013   HCT 35.8 06/24/2013   MCV 89.5 06/24/2013   PLT 304 06/24/2013      Chemistry      Component Value Date/Time   NA 140 06/24/2013 1258   NA 140 01/26/2012 1022   K 3.9 06/24/2013 1258   K 4.3 01/26/2012 1022   CL 101 01/26/2012 1022   CO2 25 06/24/2013 1258   CO2 30 01/26/2012 1022   BUN 15.4 06/24/2013 1258   BUN 15 01/26/2012 1022   CREATININE 1.1 06/24/2013 1258   CREATININE 0.72 01/26/2012 1022      Component Value Date/Time   CALCIUM 9.3 06/24/2013 1258   CALCIUM 9.3 01/26/2012 1022   ALKPHOS 73 06/24/2013 1258   ALKPHOS 44 01/26/2012 1022   AST 16 06/24/2013 1258   AST 17  01/26/2012 1022   ALT 17 06/24/2013 1258   ALT 16 01/26/2012 1022   BILITOT <0.20 06/24/2013 1258   BILITOT 0.3 01/26/2012 1022       RADIOGRAPHIC STUDIES:  No results found.  ASSESSMENT: 73 year old female with  #1 stage I invasive ductal carcinoma of the right breast diagnosed April 2012. Patient underwent a lumpectomy followed by radiation therapy and then she was laced on tamoxifen starting October 2012. Overall she is tolerating this quite well.    PLAN:  #1 continue tamoxifen 20 mg daily.  #  2 patient will be seen back in 6 months time for followup.   All questions were answered. The patient knows to call the clinic with any problems, questions or concerns. We can certainly see the patient much sooner if necessary.  I spent 15 minutes counseling the patient face to face. The total time spent in the appointment was 30 minutes.    Marcy Panning, MD Medical/Oncology [facility] [phone] (beeper) [phone] (Office)  06/24/2013, 2:17 PM

## 2013-09-14 ENCOUNTER — Other Ambulatory Visit (INDEPENDENT_AMBULATORY_CARE_PROVIDER_SITE_OTHER): Payer: Self-pay | Admitting: General Surgery

## 2013-09-14 DIAGNOSIS — Z853 Personal history of malignant neoplasm of breast: Secondary | ICD-10-CM

## 2013-09-14 DIAGNOSIS — Z9889 Other specified postprocedural states: Secondary | ICD-10-CM

## 2013-12-07 ENCOUNTER — Ambulatory Visit
Admission: RE | Admit: 2013-12-07 | Discharge: 2013-12-07 | Disposition: A | Discharge status: Home / Self Care | Payer: Medicare Other | Source: Ambulatory Visit | Attending: General Surgery | Admitting: General Surgery

## 2013-12-07 DIAGNOSIS — Z853 Personal history of malignant neoplasm of breast: Secondary | ICD-10-CM

## 2013-12-07 DIAGNOSIS — Z9889 Other specified postprocedural states: Secondary | ICD-10-CM

## 2013-12-13 ENCOUNTER — Telehealth: Payer: Self-pay | Admitting: Hematology and Oncology

## 2013-12-13 NOTE — Telephone Encounter (Signed)
, °

## 2013-12-23 ENCOUNTER — Other Ambulatory Visit: Payer: Medicare Other

## 2013-12-23 ENCOUNTER — Ambulatory Visit: Payer: Medicare Other | Admitting: Oncology

## 2014-01-18 ENCOUNTER — Other Ambulatory Visit: Payer: Self-pay | Admitting: *Deleted

## 2014-01-18 DIAGNOSIS — C50911 Malignant neoplasm of unspecified site of right female breast: Secondary | ICD-10-CM

## 2014-01-18 MED ORDER — TAMOXIFEN CITRATE 20 MG PO TABS: 20.0000 mg | ORAL_TABLET | Freq: Every day | ORAL | Status: DC

## 2014-02-16 ENCOUNTER — Other Ambulatory Visit: Payer: Self-pay

## 2014-02-16 DIAGNOSIS — C50919 Malignant neoplasm of unspecified site of unspecified female breast: Secondary | ICD-10-CM

## 2014-02-17 ENCOUNTER — Ambulatory Visit (HOSPITAL_BASED_OUTPATIENT_CLINIC_OR_DEPARTMENT_OTHER): Payer: Medicare Other | Admitting: Hematology and Oncology

## 2014-02-17 ENCOUNTER — Other Ambulatory Visit (HOSPITAL_BASED_OUTPATIENT_CLINIC_OR_DEPARTMENT_OTHER): Payer: Medicare Other

## 2014-02-17 ENCOUNTER — Encounter: Payer: Self-pay | Admitting: Hematology and Oncology

## 2014-02-17 ENCOUNTER — Telehealth: Payer: Self-pay | Admitting: Hematology and Oncology

## 2014-02-17 VITALS — BP 166/67 | HR 55 | Temp 98.6°F | Resp 20 | Ht 64.0 in | Wt 278.2 lb

## 2014-02-17 DIAGNOSIS — C189 Malignant neoplasm of colon, unspecified: Secondary | ICD-10-CM

## 2014-02-17 DIAGNOSIS — C50919 Malignant neoplasm of unspecified site of unspecified female breast: Secondary | ICD-10-CM

## 2014-02-17 DIAGNOSIS — C50911 Malignant neoplasm of unspecified site of right female breast: Secondary | ICD-10-CM

## 2014-02-17 DIAGNOSIS — Z853 Personal history of malignant neoplasm of breast: Secondary | ICD-10-CM

## 2014-02-17 LAB — COMPREHENSIVE METABOLIC PANEL (CC13)
ALBUMIN: 3.5 g/dL (ref 3.5–5.0)
ALT: 19 U/L (ref 0–55)
AST: 17 U/L (ref 5–34)
Alkaline Phosphatase: 56 U/L (ref 40–150)
Anion Gap: 9 mEq/L (ref 3–11)
BUN: 13.9 mg/dL (ref 7.0–26.0)
CALCIUM: 9.1 mg/dL (ref 8.4–10.4)
CHLORIDE: 103 meq/L (ref 98–109)
CO2: 26 meq/L (ref 22–29)
CREATININE: 0.7 mg/dL (ref 0.6–1.1)
Glucose: 133 mg/dl (ref 70–140)
Potassium: 3.9 mEq/L (ref 3.5–5.1)
Sodium: 138 mEq/L (ref 136–145)
Total Bilirubin: 0.25 mg/dL (ref 0.20–1.20)
Total Protein: 7.1 g/dL (ref 6.4–8.3)

## 2014-02-17 LAB — CBC WITH DIFFERENTIAL/PLATELET
BASO%: 0.4 % (ref 0.0–2.0)
Basophils Absolute: 0 10*3/uL (ref 0.0–0.1)
EOS%: 3.3 % (ref 0.0–7.0)
Eosinophils Absolute: 0.2 10*3/uL (ref 0.0–0.5)
HCT: 36.6 % (ref 34.8–46.6)
HGB: 12.2 g/dL (ref 11.6–15.9)
LYMPH%: 30.7 % (ref 14.0–49.7)
MCH: 29.9 pg (ref 25.1–34.0)
MCHC: 33.3 g/dL (ref 31.5–36.0)
MCV: 89.7 fL (ref 79.5–101.0)
MONO#: 0.4 10*3/uL (ref 0.1–0.9)
MONO%: 5.4 % (ref 0.0–14.0)
NEUT#: 4.2 10*3/uL (ref 1.5–6.5)
NEUT%: 60.2 % (ref 38.4–76.8)
Platelets: 206 10*3/uL (ref 145–400)
RBC: 4.08 10*6/uL (ref 3.70–5.45)
RDW: 13.1 % (ref 11.2–14.5)
WBC: 6.9 10*3/uL (ref 3.9–10.3)
lymph#: 2.1 10*3/uL (ref 0.9–3.3)

## 2014-02-17 MED ORDER — TAMOXIFEN CITRATE 20 MG PO TABS: 20.0000 mg | ORAL_TABLET | Freq: Every day | ORAL | Status: DC

## 2014-02-17 NOTE — Telephone Encounter (Signed)
per pof to sch pt appt-sch pt mamma-gave pt copy of sch

## 2014-02-17 NOTE — Assessment & Plan Note (Signed)
Right breast cancer stage I ER PR positive HER-2 negative Oncotype DX 0 status post lumpectomy and radiation therapy in 2012. Patient has been on antiestrogen therapy with tamoxifen since October 2012. The plan of treatment was 5 years. She reports to be feeling well has no major problems or concerns. Denies any lumps or nodules. Mammograms that were done in May 2015 were reviewed. The breast exam today did not reveal any abnormalities. I recommended annual followup in May with another mammogram.  Discussed the importance of physical exercise in decreasing the likelihood of breast cancer recurrence. Recommended 30 mins daily 6 days a week of either brisk walking or cycling or swimming. Encouraged patient to eat more fruits and vegetables and decrease red meat.

## 2014-02-17 NOTE — Progress Notes (Signed)
Patient Care Team: [name], MD as PCP - General (Family Medicine)  DIAGNOSIS: No matching staging information was found for the patient.  SUMMARY OF ONCOLOGIC HISTORY:   Breast cancer-right T1cN0   10/28/2010 Surgery Right breast lumpectomy 1.8 cm IDC grade 2 with associated DCIS ER 100% PR 100% Ki-67 26% HER-2 -2 SLN negative Oncotype DX 0   12/10/2010 - 01/28/2011 Radiation Therapy Radiation therapy to lumpectomy site   04/14/2011 -  Anti-estrogen oral therapy Tamoxifen 20 mg daily    CHIEF COMPLIANT: Denies any new problems or concerns. Patient is here for annual followup  INTERVAL HISTORY: Kathleen [name] is a 73 year old Caucasian lady with above-mentioned history of breast cancer that was treated with lumpectomy and radiation therapy in June 2012. She has been on antiestrogen therapy since October 2012 and had completed nearly 3 years of treatment. She denies any hot flashes or muscle aches or pains. She denies any vaginal discharge she gets annual Pap smears regularly and she had annual mammograms in May 2015.  REVIEW OF SYSTEMS:   Constitutional: Denies fevers, chills or abnormal weight loss Eyes: Denies blurriness of vision Ears, nose, mouth, throat, and face: Denies mucositis or sore throat Respiratory: Denies cough, dyspnea or wheezes Cardiovascular: Denies palpitation, chest discomfort or lower extremity swelling Gastrointestinal:  Denies nausea, heartburn or change in bowel habits Skin: Denies abnormal skin rashes Lymphatics: Denies new lymphadenopathy or easy bruising Neurological:Denies numbness, tingling or new weaknesses Behavioral/Psych: Mood is stable, no new changes  Breast:  denies any pain or lumps or nodules in either breasts All other systems were reviewed with the patient and are negative.  I have reviewed the past medical history, past surgical history, social history and family history with the patient and they are unchanged from previous note.  ALLERGIES:   is allergic to mobic; naproxen sodium; tape; and doxycycline.  MEDICATIONS:  Current Outpatient Prescriptions  Medication Sig Dispense Refill  . acetaminophen (TYLENOL) 500 MG tablet Take 500 mg by mouth every 6 (six) hours as needed. 1-2 tablets q 4-6 hrs              . aspirin 81 MG tablet Take 81 mg by mouth daily.      . betamethasone dipropionate (DIPROLENE) 0.05 % cream Apply topically 1 day or 1 dose.        . bisoprolol (ZEBETA) 5 MG tablet       . Calcium Carbonate-Vitamin D (CALCIUM-VITAMIN D) 500-200 MG-UNIT per tablet Take 1 tablet by mouth 1 day or 1 dose.        . ciprofloxacin (CIPRO) 500 MG/5ML (10%) suspension Take 250 mg by mouth 2 (two) times daily.      . clotrimazole-betamethasone (LOTRISONE) cream       . docusate sodium (COLACE) 100 MG capsule Take 100 mg by mouth 2 (two) times daily.        . fexofenadine (ALLEGRA) 180 MG tablet Take 180 mg by mouth daily.       . hydrochlorothiazide (HYDRODIURIL) 25 MG tablet       . HYDROcodone-homatropine (HYCODAN) 5-1.5 MG/5ML syrup Take by mouth every 6 (six) hours as needed.      Marland Kitchen ibuprofen (ADVIL,MOTRIN) 200 MG tablet Take 200 mg by mouth every 6 (six) hours as needed.        Kathleen [name] Oil 300 MG CAPS Take by mouth daily.        Marland Kitchen omeprazole (PRILOSEC) 20 MG capsule Take 20 mg by mouth daily.        Marland Kitchen  omeprazole-sodium bicarbonate (ZEGERID) 40-1100 MG per capsule Take 1 capsule by mouth daily before breakfast.        . oxybutynin (DITROPAN) 5 MG tablet Take 5 mg by mouth 2 (two) times daily.        . simvastatin (ZOCOR) 40 MG tablet Take 40 mg by mouth at bedtime.        . tamoxifen (NOLVADEX) 20 MG tablet Take 1 tablet (20 mg total) by mouth daily.  90 tablet  1   No current facility-administered medications for this visit.    PHYSICAL EXAMINATION: ECOG PERFORMANCE STATUS: 0 - Asymptomatic  Filed Vitals:   02/17/14 1117  BP: 166/67  Pulse: 55  Temp: 98.6 F (37 C)  Resp: 20   Filed Weights   02/17/14 1117   Weight: 278 lb 4 oz (126.213 kg)    GENERAL:alert, no distress and comfortable SKIN: skin color, texture, turgor are normal, no rashes or significant lesions EYES: normal, Conjunctiva are pink and non-injected, sclera clear OROPHARYNX:no exudate, no erythema and lips, buccal mucosa, and tongue normal  NECK: supple, thyroid normal size, non-tender, without nodularity LYMPH:  no palpable lymphadenopathy in the cervical, axillary or inguinal LUNGS: clear to auscultation and percussion with normal breathing effort HEART: regular rate & rhythm and no murmurs and no lower extremity edema ABDOMEN:abdomen soft, non-tender and normal bowel sounds Musculoskeletal:no cyanosis of digits and no clubbing  NEURO: alert & oriented x 3 with fluent speech, no focal motor/sensory deficits BREAST: No palpable masses lungs or nodules in either right or left breasts. No palpable axillary supraclavicular or infraclavicular adenopathy no breast tenderness or nipple discharge.   LABORATORY DATA:  I have reviewed the data as listed   Chemistry      Component Value Date/Time   NA 138 02/17/2014 1056   NA 140 01/26/2012 1022   K 3.9 02/17/2014 1056   K 4.3 01/26/2012 1022   CL 101 01/26/2012 1022   CO2 26 02/17/2014 1056   CO2 30 01/26/2012 1022   BUN 13.9 02/17/2014 1056   BUN 15 01/26/2012 1022   CREATININE 0.7 02/17/2014 1056   CREATININE 0.72 01/26/2012 1022      Component Value Date/Time   CALCIUM 9.1 02/17/2014 1056   CALCIUM 9.3 01/26/2012 1022   ALKPHOS 56 02/17/2014 1056   ALKPHOS 44 01/26/2012 1022   AST 17 02/17/2014 1056   AST 17 01/26/2012 1022   ALT 19 02/17/2014 1056   ALT 16 01/26/2012 1022   BILITOT 0.25 02/17/2014 1056   BILITOT 0.3 01/26/2012 1022       Lab Results  Component Value Date   WBC 6.9 02/17/2014   HGB 12.2 02/17/2014   HCT 36.6 02/17/2014   MCV 89.7 02/17/2014   PLT 206 02/17/2014   NEUTROABS 4.2 02/17/2014     RADIOGRAPHIC STUDIES: I have personally reviewed the radiology reports and agreed  with their findings. No results found.   ASSESSMENT & PLAN:  Breast cancer-right T1cN0 Right breast cancer stage I ER PR positive HER-2 negative Oncotype DX 0 status post lumpectomy and radiation therapy in 2012. Patient has been on antiestrogen therapy with tamoxifen since October 2012. The plan of treatment was 5 years. She reports to be feeling well has no major problems or concerns. Denies any lumps or nodules. Mammograms that were done in May 2015 were reviewed. The breast exam today did not reveal any abnormalities. I recommended annual followup in May with another mammogram.  Discussed the importance of physical exercise  in decreasing the likelihood of breast cancer recurrence. Recommended 30 mins daily 6 days a week of either brisk walking or cycling or swimming. Encouraged patient to eat more fruits and vegetables and decrease red meat.   Colon cancer Colon cancer stage I that was identified on a polyp. On hemicolectomy there was no additional cancer. We will check a CEA level on her next followup.    Orders Placed This Encounter  Procedures  . MM Digital Diagnostic Bilat    Standing Status: Future     Number of Occurrences:      Standing Expiration Date: 02/17/2015    Order Specific Question:  Reason for Exam (SYMPTOM  OR DIAGNOSIS REQUIRED)    Answer:  History of right breast cancer treated with lumpectomy annual followup    Order Specific Question:  Preferred imaging location?    Answer:  [facility]  . CBC with Differential    Standing Status: Future     Number of Occurrences:      Standing Expiration Date: 02/17/2015  . Comprehensive metabolic panel (Cmet) - [facility]    Standing Status: Future     Number of Occurrences:      Standing Expiration Date: 02/17/2015  . CEA    Standing Status: Future     Number of Occurrences:      Standing Expiration Date: 03/24/2015   The patient has a good understanding of the overall plan. she agrees with it. She will call with any problems  that may develop before her next visit here.  I spent 20 minutes counseling the patient face to face. The total time spent in the appointment was 25 minutes and more than 50% was on counseling and review of test results    Rulon Eisenmenger, MD 02/17/2014 12:02 PM

## 2014-02-17 NOTE — Assessment & Plan Note (Signed)
Colon cancer stage I that was identified on a polyp. On hemicolectomy there was no additional cancer. We will check a CEA level on her next followup.

## 2014-03-07 ENCOUNTER — Telehealth: Payer: Self-pay | Admitting: *Deleted

## 2014-03-07 NOTE — Telephone Encounter (Signed)
Received office notes from [facility]. Sent to scan.

## 2014-08-21 ENCOUNTER — Other Ambulatory Visit: Payer: Self-pay

## 2014-08-21 DIAGNOSIS — C50911 Malignant neoplasm of unspecified site of right female breast: Secondary | ICD-10-CM

## 2014-08-21 MED ORDER — TAMOXIFEN CITRATE 20 MG PO TABS: 20.0000 mg | ORAL_TABLET | Freq: Every day | ORAL | Status: DC

## 2014-08-21 NOTE — Telephone Encounter (Signed)
[facility] - refill sent and appt d/t for June.  Pt to call clinic with any questions

## 2014-12-11 ENCOUNTER — Ambulatory Visit
Admission: RE | Admit: 2014-12-11 | Discharge: 2014-12-11 | Disposition: A | Discharge status: Home / Self Care | Payer: Medicare Other | Source: Ambulatory Visit | Attending: Hematology and Oncology | Admitting: Hematology and Oncology

## 2014-12-11 DIAGNOSIS — C50919 Malignant neoplasm of unspecified site of unspecified female breast: Secondary | ICD-10-CM

## 2014-12-13 NOTE — Assessment & Plan Note (Signed)
Right breast cancer stage I ER PR positive HER-2 negative Oncotype DX 0 status post lumpectomy and radiation therapy in 2012. Patient has been on antiestrogen therapy with tamoxifen since October 2012. The plan of treatment was 5 years  Tamoxifen toxicities:  Breast Cancer Surveillance: 1. Breast exam 12/13/14: Normal 2. Mammogram 12/11/14 No abnormalities. Postsurgical changes. Breast Density Category B. I recommended that she get 3-D mammograms for surveillance. Discussed the differences between different breast density categories.  RTC in 1 year  Colon cancer Colon cancer stage I that was identified on a polyp. On hemicolectomy there was no additional cancer.

## 2014-12-14 ENCOUNTER — Other Ambulatory Visit (HOSPITAL_BASED_OUTPATIENT_CLINIC_OR_DEPARTMENT_OTHER): Payer: Medicare Other

## 2014-12-14 ENCOUNTER — Ambulatory Visit (HOSPITAL_BASED_OUTPATIENT_CLINIC_OR_DEPARTMENT_OTHER): Payer: Medicare Other | Admitting: Hematology and Oncology

## 2014-12-14 ENCOUNTER — Encounter: Payer: Self-pay | Admitting: Hematology and Oncology

## 2014-12-14 ENCOUNTER — Telehealth: Payer: Self-pay | Admitting: Hematology and Oncology

## 2014-12-14 VITALS — BP 130/64 | HR 53 | Temp 98.1°F | Resp 19 | Ht 64.0 in | Wt 249.7 lb

## 2014-12-14 DIAGNOSIS — C50211 Malignant neoplasm of upper-inner quadrant of right female breast: Secondary | ICD-10-CM

## 2014-12-14 DIAGNOSIS — Z85038 Personal history of other malignant neoplasm of large intestine: Secondary | ICD-10-CM | POA: Diagnosis not present

## 2014-12-14 DIAGNOSIS — Z79810 Long term (current) use of selective estrogen receptor modulators (SERMs): Secondary | ICD-10-CM | POA: Diagnosis not present

## 2014-12-14 DIAGNOSIS — C50911 Malignant neoplasm of unspecified site of right female breast: Secondary | ICD-10-CM

## 2014-12-14 DIAGNOSIS — Z170 Estrogen receptor positive status [ER+]: Secondary | ICD-10-CM

## 2014-12-14 DIAGNOSIS — C50919 Malignant neoplasm of unspecified site of unspecified female breast: Secondary | ICD-10-CM

## 2014-12-14 LAB — CBC WITH DIFFERENTIAL/PLATELET
BASO%: 0.7 % (ref 0.0–2.0)
Basophils Absolute: 0.1 10*3/uL (ref 0.0–0.1)
EOS%: 2.1 % (ref 0.0–7.0)
Eosinophils Absolute: 0.2 10*3/uL (ref 0.0–0.5)
HCT: 37 % (ref 34.8–46.6)
HGB: 12.4 g/dL (ref 11.6–15.9)
LYMPH%: 23 % (ref 14.0–49.7)
MCH: 29.8 pg (ref 25.1–34.0)
MCHC: 33.5 g/dL (ref 31.5–36.0)
MCV: 88.9 fL (ref 79.5–101.0)
MONO#: 0.5 10*3/uL (ref 0.1–0.9)
MONO%: 6 % (ref 0.0–14.0)
NEUT#: 5.4 10*3/uL (ref 1.5–6.5)
NEUT%: 68.2 % (ref 38.4–76.8)
Platelets: 230 10*3/uL (ref 145–400)
RBC: 4.17 10*6/uL (ref 3.70–5.45)
RDW: 12.5 % (ref 11.2–14.5)
WBC: 8 10*3/uL (ref 3.9–10.3)
lymph#: 1.8 10*3/uL (ref 0.9–3.3)

## 2014-12-14 LAB — COMPREHENSIVE METABOLIC PANEL (CC13)
ALT: 15 U/L (ref 0–55)
AST: 16 U/L (ref 5–34)
Albumin: 3.8 g/dL (ref 3.5–5.0)
Alkaline Phosphatase: 52 U/L (ref 40–150)
Anion Gap: 11 mEq/L (ref 3–11)
BILIRUBIN TOTAL: 0.4 mg/dL (ref 0.20–1.20)
BUN: 21.3 mg/dL (ref 7.0–26.0)
CALCIUM: 9.4 mg/dL (ref 8.4–10.4)
CO2: 28 meq/L (ref 22–29)
Chloride: 103 mEq/L (ref 98–109)
Creatinine: 0.9 mg/dL (ref 0.6–1.1)
EGFR: 64 mL/min/{1.73_m2} — AB (ref >90)
GLUCOSE: 120 mg/dL (ref 70–140)
Potassium: 4.2 mEq/L (ref 3.5–5.1)
Sodium: 141 mEq/L (ref 136–145)
Total Protein: 7.1 g/dL (ref 6.4–8.3)

## 2014-12-14 LAB — CEA: CEA: <0.5 ng/mL (ref 0.0–5.0)

## 2014-12-14 NOTE — Telephone Encounter (Signed)
Appointments made and avs printed for patient °

## 2014-12-14 NOTE — Progress Notes (Signed)
Patient Care Team: [name], MD as PCP - General (Family Medicine)  DIAGNOSIS: Colon cancer   Staging form: Colon and Rectum, AJCC 7th Edition     Pathologic: Stage I (T1, N0, cM0) - Signed by Rulon Eisenmenger, MD on 02/17/2014   SUMMARY OF ONCOLOGIC HISTORY:   Breast cancer-right T1cN0   10/28/2010 Surgery Right breast lumpectomy 1.8 cm IDC grade 2 with associated DCIS ER 100% PR 100% Ki-67 26% HER-2 -2 SLN negative Oncotype DX 0   12/10/2010 - 01/28/2011 Radiation Therapy Radiation therapy to lumpectomy site   04/14/2011 -  Anti-estrogen oral therapy Tamoxifen 20 mg daily    CHIEF COMPLIANT: Follow-up right breast cancer on tamoxifen  INTERVAL HISTORY: Kathleen [name] is a 74 year old with above-mentioned history of right breast cancer treated with lumpectomy and currently on antiestrogen therapy with tamoxifen. She is tolerating tamoxifen extremely well without any major problems or concerns. Does not have any hot flashes. She had lost 30 pounds by following [employer]. She feels so much better losing all the weight. She is borderline diabetic.  REVIEW OF SYSTEMS:   Constitutional: Denies fevers, chills or abnormal weight loss Eyes: Denies blurriness of vision Ears, nose, mouth, throat, and face: Denies mucositis or sore throat Respiratory: Denies cough, dyspnea or wheezes Cardiovascular: Denies palpitation, chest discomfort or lower extremity swelling Gastrointestinal:  Denies nausea, heartburn or change in bowel habits Skin: Denies abnormal skin rashes Lymphatics: Denies Kathleen lymphadenopathy or easy bruising Neurological:Denies numbness, tingling or Kathleen weaknesses Behavioral/Psych: Mood is stable, no Kathleen changes  Breast:  denies any pain or lumps or nodules in either breasts All other systems were reviewed with the patient and are negative.  I have reviewed the past medical history, past surgical history, social history and family history with the patient and they are  unchanged from previous note.  ALLERGIES:  is allergic to mobic; naproxen sodium; tape; and doxycycline.  MEDICATIONS:  Current Outpatient Prescriptions  Medication Sig Dispense Refill  . acetaminophen (TYLENOL) 500 MG tablet Take 500 mg by mouth every 6 (six) hours as needed. 1-2 tablets q 4-6 hrs            . aspirin 81 MG tablet Take 81 mg by mouth daily.    . betamethasone dipropionate (DIPROLENE) 0.05 % cream Apply topically 1 day or 1 dose.      . bisoprolol (ZEBETA) 5 MG tablet     . Calcium Carbonate-Vitamin D (CALCIUM-VITAMIN D) 500-200 MG-UNIT per tablet Take 1 tablet by mouth 1 day or 1 dose.      . fexofenadine (ALLEGRA) 180 MG tablet Take 180 mg by mouth daily.     . hydrochlorothiazide (HYDRODIURIL) 25 MG tablet     . ibuprofen (ADVIL,MOTRIN) 200 MG tablet Take 200 mg by mouth every 6 (six) hours as needed.      Javier Docker Oil 300 MG CAPS Take by mouth daily.      Marland Kitchen omeprazole (PRILOSEC) 20 MG capsule Take 20 mg by mouth daily.      Marland Kitchen omeprazole-sodium bicarbonate (ZEGERID) 40-1100 MG per capsule Take 1 capsule by mouth daily before breakfast.      . simvastatin (ZOCOR) 40 MG tablet Take 40 mg by mouth at bedtime.      . tamoxifen (NOLVADEX) 20 MG tablet Take 1 tablet (20 mg total) by mouth daily. 90 tablet 1   No current facility-administered medications for this visit.    PHYSICAL EXAMINATION: ECOG PERFORMANCE STATUS: 0 - Asymptomatic  Filed  Vitals:   12/14/14 0812  BP: 130/64  Pulse: 53  Temp: 98.1 F (36.7 C)  Resp: 19   Filed Weights   12/14/14 0812  Weight: 249 lb 11.2 oz (113.263 kg)    GENERAL:alert, no distress and comfortable SKIN: skin color, texture, turgor are normal, no rashes or significant lesions EYES: normal, Conjunctiva are pink and non-injected, sclera clear OROPHARYNX:no exudate, no erythema and lips, buccal mucosa, and tongue normal  NECK: supple, thyroid normal size, non-tender, without nodularity LYMPH:  no palpable  lymphadenopathy in the cervical, axillary or inguinal LUNGS: clear to auscultation and percussion with normal breathing effort HEART: regular rate & rhythm and no murmurs and no lower extremity edema ABDOMEN:abdomen soft, non-tender and normal bowel sounds Musculoskeletal:no cyanosis of digits and no clubbing  NEURO: alert & oriented x 3 with fluent speech, no focal motor/sensory deficits BREAST: No palpable masses or nodules in either right or left breasts. No palpable axillary supraclavicular or infraclavicular adenopathy no breast tenderness or nipple discharge. (exam performed in the presence of a chaperone)  LABORATORY DATA:  I have reviewed the data as listed   Chemistry      Component Value Date/Time   NA 138 02/17/2014 1056   NA 140 01/26/2012 1022   K 3.9 02/17/2014 1056   K 4.3 01/26/2012 1022   CL 101 01/26/2012 1022   CO2 26 02/17/2014 1056   CO2 30 01/26/2012 1022   BUN 13.9 02/17/2014 1056   BUN 15 01/26/2012 1022   CREATININE 0.7 02/17/2014 1056   CREATININE 0.72 01/26/2012 1022      Component Value Date/Time   CALCIUM 9.1 02/17/2014 1056   CALCIUM 9.3 01/26/2012 1022   ALKPHOS 56 02/17/2014 1056   ALKPHOS 44 01/26/2012 1022   AST 17 02/17/2014 1056   AST 17 01/26/2012 1022   ALT 19 02/17/2014 1056   ALT 16 01/26/2012 1022   BILITOT 0.25 02/17/2014 1056   BILITOT 0.3 01/26/2012 1022       Lab Results  Component Value Date   WBC 8.0 12/14/2014   HGB 12.4 12/14/2014   HCT 37.0 12/14/2014   MCV 88.9 12/14/2014   PLT 230 12/14/2014   NEUTROABS 5.4 12/14/2014     RADIOGRAPHIC STUDIES: I have personally reviewed the radiology reports and agreed with their findings. Mammogram 12/11/2014 no abnormalities  ASSESSMENT & PLAN:  Breast cancer-right T1cN0 Right breast cancer stage I ER PR positive HER-2 negative Oncotype DX 0 status post lumpectomy and radiation therapy in 2012. Patient has been on antiestrogen therapy with tamoxifen since October 2012. The  plan of treatment was 5 years  Tamoxifen toxicities: Denies any toxicities of tamoxifen Even though there is data for 10 years of tamoxifen being superior to 5 years, given the fact that she has early stage breast cancer with an Oncotype DX score of 0, I'm leaning toward stopping therapy at 5 years which will be in October 2017.  Breast Cancer Surveillance: 1. Breast exam 12/13/14: Normal 2. Mammogram 12/11/14 No abnormalities. Postsurgical changes. Breast Density Category B. I recommended that she get 3-D mammograms for surveillance. Discussed the differences between different breast density categories.  RTC in 1 year  Colon cancer Colon cancer stage I that was identified on a polyp. On hemicolectomy there was no additional cancer.      No orders of the defined types were placed in this encounter.   The patient has a good understanding of the overall plan. she agrees with it. she will call  with any problems that may develop before the next visit here.   Rulon Eisenmenger, MD

## 2015-04-16 ENCOUNTER — Other Ambulatory Visit: Payer: Self-pay

## 2015-04-16 DIAGNOSIS — C50911 Malignant neoplasm of unspecified site of right female breast: Secondary | ICD-10-CM

## 2015-04-16 MED ORDER — TAMOXIFEN CITRATE 20 MG PO TABS: 20.0000 mg | ORAL_TABLET | Freq: Every day | ORAL | Status: DC

## 2015-10-19 ENCOUNTER — Other Ambulatory Visit: Payer: Self-pay | Admitting: *Deleted

## 2015-10-19 DIAGNOSIS — C50911 Malignant neoplasm of unspecified site of right female breast: Secondary | ICD-10-CM

## 2015-10-19 MED ORDER — TAMOXIFEN CITRATE 20 MG PO TABS: 20.0000 mg | ORAL_TABLET | Freq: Every day | ORAL | Status: DC

## 2015-11-06 ENCOUNTER — Other Ambulatory Visit: Payer: Self-pay | Admitting: Hematology and Oncology

## 2015-11-06 DIAGNOSIS — Z853 Personal history of malignant neoplasm of breast: Secondary | ICD-10-CM

## 2015-11-06 DIAGNOSIS — Z9889 Other specified postprocedural states: Secondary | ICD-10-CM

## 2015-12-12 ENCOUNTER — Ambulatory Visit
Admission: RE | Admit: 2015-12-12 | Discharge: 2015-12-12 | Disposition: A | Discharge status: Home / Self Care | Payer: Medicare Other | Source: Ambulatory Visit | Attending: Hematology and Oncology | Admitting: Hematology and Oncology

## 2015-12-12 DIAGNOSIS — Z9889 Other specified postprocedural states: Secondary | ICD-10-CM

## 2015-12-12 DIAGNOSIS — Z853 Personal history of malignant neoplasm of breast: Secondary | ICD-10-CM

## 2015-12-14 ENCOUNTER — Encounter: Payer: Self-pay | Admitting: Hematology and Oncology

## 2015-12-14 ENCOUNTER — Ambulatory Visit (HOSPITAL_BASED_OUTPATIENT_CLINIC_OR_DEPARTMENT_OTHER): Payer: Medicare Other | Admitting: Hematology and Oncology

## 2015-12-14 ENCOUNTER — Telehealth: Payer: Self-pay | Admitting: Hematology and Oncology

## 2015-12-14 VITALS — BP 143/56 | HR 55 | Temp 98.1°F | Resp 18 | Wt 253.0 lb

## 2015-12-14 DIAGNOSIS — Z170 Estrogen receptor positive status [ER+]: Secondary | ICD-10-CM | POA: Diagnosis not present

## 2015-12-14 DIAGNOSIS — C50411 Malignant neoplasm of upper-outer quadrant of right female breast: Secondary | ICD-10-CM | POA: Diagnosis not present

## 2015-12-14 DIAGNOSIS — C50911 Malignant neoplasm of unspecified site of right female breast: Secondary | ICD-10-CM

## 2015-12-14 MED ORDER — TAMOXIFEN CITRATE 20 MG PO TABS: 20.0000 mg | ORAL_TABLET | Freq: Every day | ORAL | Status: DC

## 2015-12-14 NOTE — Assessment & Plan Note (Signed)
Right breast cancer stage I ER PR positive HER-2 negative Oncotype DX 0 status post lumpectomy and radiation therapy in 2012. Patient has been on antiestrogen therapy with tamoxifen since October 2012. The plan of treatment was 5 years  Tamoxifen toxicities: Denies any toxicities of tamoxifen Even though there is data for 10 years of tamoxifen being superior to 5 years, given the fact that she has early stage breast cancer with an Oncotype DX score of 0, I'm leaning toward stopping therapy at 5 years which will be in October 2017.  Breast Cancer Surveillance: 1. Breast exam 12/14/2015: Normal 2. Mammogram 12/12/2015 No abnormalities. Postsurgical changes. Breast Density Category B. I recommended that she get 3-D mammograms for surveillance.  RTC in 1 year with survivorship clinic

## 2015-12-14 NOTE — Progress Notes (Signed)
Patient Care Team: [name], MD as PCP - General (Family Medicine)  DIAGNOSIS: Colon cancer [facility])   Staging form: Colon and Rectum, AJCC 7th Edition     Pathologic: Stage I (T1, N0, cM0) - Signed by Rulon Eisenmenger, MD on 02/17/2014   SUMMARY OF ONCOLOGIC HISTORY:   Breast cancer of upper-outer quadrant of right female breast ([place])   10/28/2010 Surgery Right breast lumpectomy 1.8 cm IDC grade 2 with associated DCIS ER 100% PR 100% Ki-67 26% HER-2 -2 SLN negative Oncotype DX 0   12/10/2010 - 01/28/2011 Radiation Therapy Radiation therapy to lumpectomy site   04/14/2011 -  Anti-estrogen oral therapy Tamoxifen 20 mg daily    CHIEF COMPLIANT: Follow-up on tamoxifen therapy  INTERVAL HISTORY: [name] is a 75 year old with above-mentioned history of right breast cancer treated with lumpectomy radiation and is currently on tamoxifen therapy for the past 5 years. She finishes 5 years in October. She reports no major problems with tamoxifen. She denies any hot flashes or myalgias. She denies any lumps or nodules in the breasts.  REVIEW OF SYSTEMS:   Constitutional: Denies fevers, chills or abnormal weight loss Eyes: Denies blurriness of vision Ears, nose, mouth, throat, and face: Denies mucositis or sore throat Respiratory: Denies cough, dyspnea or wheezes Cardiovascular: Denies palpitation, chest discomfort Gastrointestinal:  Denies nausea, heartburn or change in bowel habits Skin: Denies abnormal skin rashes Lymphatics: Denies new lymphadenopathy or easy bruising Neurological:Denies numbness, tingling or new weaknesses Behavioral/Psych: Mood is stable, no new changes  Extremities: No lower extremity edema Breast:  denies any pain or lumps or nodules in either breasts All other systems were reviewed with the patient and are negative.  I have reviewed the past medical history, past surgical history, social history and family history with the patient and they are unchanged from  previous note.  ALLERGIES:  is allergic to mobic; naproxen sodium; tape; and doxycycline.  MEDICATIONS:  Current Outpatient Prescriptions  Medication Sig Dispense Refill  . acetaminophen (TYLENOL) 500 MG tablet Take 500 mg by mouth every 6 (six) hours as needed. 1-2 tablets q 4-6 hrs            . aspirin 81 MG tablet Take 81 mg by mouth daily.    . betamethasone dipropionate (DIPROLENE) 0.05 % cream Apply topically 1 day or 1 dose.      . bisoprolol (ZEBETA) 5 MG tablet     . Calcium Carbonate-Vitamin D (CALCIUM-VITAMIN D) 500-200 MG-UNIT per tablet Take 1 tablet by mouth 1 day or 1 dose.      . fexofenadine (ALLEGRA) 180 MG tablet Take 180 mg by mouth daily.     . Glucosamine HCl 1500 MG TABS Take 2 tablets by mouth daily.    . hydrochlorothiazide (HYDRODIURIL) 25 MG tablet     . ibuprofen (ADVIL,MOTRIN) 200 MG tablet Take 200 mg by mouth every 6 (six) hours as needed.      Javier Docker Oil 300 MG CAPS Take by mouth daily.      Marland Kitchen omeprazole (PRILOSEC) 20 MG capsule Take 20 mg by mouth daily.      Marland Kitchen omeprazole-sodium bicarbonate (ZEGERID) 40-1100 MG per capsule Take 1 capsule by mouth daily before breakfast.      . Probiotic Product (PROBIOTIC DAILY PO) Take by mouth.    . simvastatin (ZOCOR) 40 MG tablet Take 40 mg by mouth at bedtime.      . tamoxifen (NOLVADEX) 20 MG tablet Take 1 tablet (20 mg  total) by mouth daily. 90 tablet 3  . trimethoprim (TRIMPEX) 100 MG tablet Take 100 mg by mouth. 3 times a week    . Wheat Dextrin (BENEFIBER PO) Take by mouth daily.     No current facility-administered medications for this visit.    PHYSICAL EXAMINATION: ECOG PERFORMANCE STATUS: 1 - Symptomatic but completely ambulatory  Filed Vitals:   12/14/15 0852  BP: 143/56  Pulse: 55  Temp: 98.1 F (36.7 C)  Resp: 18   Filed Weights   12/14/15 0852  Weight: 253 lb (114.76 kg)    GENERAL:alert, no distress and comfortable SKIN: skin color, texture, turgor are normal, no rashes or  significant lesions EYES: normal, Conjunctiva are pink and non-injected, sclera clear OROPHARYNX:no exudate, no erythema and lips, buccal mucosa, and tongue normal  NECK: supple, thyroid normal size, non-tender, without nodularity LYMPH:  no palpable lymphadenopathy in the cervical, axillary or inguinal LUNGS: clear to auscultation and percussion with normal breathing effort HEART: regular rate & rhythm and no murmurs and no lower extremity edema ABDOMEN:abdomen soft, non-tender and normal bowel sounds MUSCULOSKELETAL:no cyanosis of digits and no clubbing  NEURO: alert & oriented x 3 with fluent speech, no focal motor/sensory deficits EXTREMITIES: No lower extremity edema BREAST: No palpable masses or nodules in either right or left breasts. No palpable axillary supraclavicular or infraclavicular adenopathy no breast tenderness or nipple discharge. (exam performed in the presence of a chaperone)  LABORATORY DATA:  I have reviewed the data as listed   Chemistry      Component Value Date/Time   NA 141 12/14/2014 0747   NA 140 01/26/2012 1022   K 4.2 12/14/2014 0747   K 4.3 01/26/2012 1022   CL 101 01/26/2012 1022   CO2 28 12/14/2014 0747   CO2 30 01/26/2012 1022   BUN 21.3 12/14/2014 0747   BUN 15 01/26/2012 1022   CREATININE 0.9 12/14/2014 0747   CREATININE 0.72 01/26/2012 1022      Component Value Date/Time   CALCIUM 9.4 12/14/2014 0747   CALCIUM 9.3 01/26/2012 1022   ALKPHOS 52 12/14/2014 0747   ALKPHOS 44 01/26/2012 1022   AST 16 12/14/2014 0747   AST 17 01/26/2012 1022   ALT 15 12/14/2014 0747   ALT 16 01/26/2012 1022   BILITOT 0.40 12/14/2014 0747   BILITOT 0.3 01/26/2012 1022       Lab Results  Component Value Date   WBC 8.0 12/14/2014   HGB 12.4 12/14/2014   HCT 37.0 12/14/2014   MCV 88.9 12/14/2014   PLT 230 12/14/2014   NEUTROABS 5.4 12/14/2014     ASSESSMENT & PLAN:  Breast cancer of upper-outer quadrant of right female breast ([place]) Right breast  cancer stage I ER PR positive HER-2 negative Oncotype DX 0 status post lumpectomy and radiation therapy in 2012. Patient has been on antiestrogen therapy with tamoxifen since October 2012. The plan of treatment was 5 years  Tamoxifen toxicities: Denies any toxicities of tamoxifen We once again discussed the duration of therapy with tamoxifen. The data from 5 versus 10 years was discussed. In order to make that decision, I recommended that we do breast cancer index. This will help [name] predict if she needs to stay on tamoxifen for 10 years.  Breast Cancer Surveillance: 1. Breast exam 12/14/2015: Normal 2. Mammogram 12/12/2015 No abnormalities. Postsurgical changes. Breast Density Category B. I recommended that she get 3-D mammograms for surveillance.  RTC in 1 year   No orders of the defined types  were placed in this encounter.   The patient has a good understanding of the overall plan. she agrees with it. she will call with any problems that may develop before the next visit here.   Rulon Eisenmenger, MD 12/14/2015

## 2015-12-14 NOTE — Telephone Encounter (Signed)
appt made and avs printed °

## 2016-01-01 ENCOUNTER — Telehealth: Payer: Self-pay | Admitting: Hematology and Oncology

## 2016-01-01 ENCOUNTER — Encounter (HOSPITAL_COMMUNITY): Payer: Self-pay

## 2016-01-01 NOTE — Telephone Encounter (Signed)
Call the patient and provided her the result of the breast cancer index. The test came back as low risk and that she does not need to stay on tamoxifen beyond 5 years.  She will take it until October and then discontinue treatment.

## 2016-10-16 ENCOUNTER — Other Ambulatory Visit: Payer: Self-pay | Admitting: Hematology and Oncology

## 2016-10-16 DIAGNOSIS — Z853 Personal history of malignant neoplasm of breast: Secondary | ICD-10-CM

## 2016-12-12 ENCOUNTER — Ambulatory Visit: Payer: Medicare Other | Admitting: Hematology and Oncology

## 2016-12-12 ENCOUNTER — Ambulatory Visit
Admission: RE | Admit: 2016-12-12 | Discharge: 2016-12-12 | Disposition: A | Discharge status: Home / Self Care | Payer: Medicare Other | Source: Ambulatory Visit | Attending: Hematology and Oncology | Admitting: Hematology and Oncology

## 2016-12-12 DIAGNOSIS — Z853 Personal history of malignant neoplasm of breast: Secondary | ICD-10-CM

## 2016-12-12 HISTORY — DX: Personal history of irradiation: Z92.3

## 2016-12-12 HISTORY — DX: Malignant neoplasm of unspecified site of unspecified female breast: C50.919

## 2016-12-18 ENCOUNTER — Encounter: Payer: Self-pay | Admitting: Hematology and Oncology

## 2016-12-18 ENCOUNTER — Ambulatory Visit (HOSPITAL_BASED_OUTPATIENT_CLINIC_OR_DEPARTMENT_OTHER): Payer: Medicare Other | Admitting: Hematology and Oncology

## 2016-12-18 DIAGNOSIS — Z170 Estrogen receptor positive status [ER+]: Secondary | ICD-10-CM | POA: Diagnosis not present

## 2016-12-18 DIAGNOSIS — C50411 Malignant neoplasm of upper-outer quadrant of right female breast: Secondary | ICD-10-CM

## 2016-12-18 MED ORDER — FIBER ADULT GUMMIES 2 G PO CHEW: 2.0000 | CHEWABLE_TABLET | Freq: Every day | ORAL | Status: DC

## 2016-12-18 NOTE — Progress Notes (Signed)
Patient Care Team: [name], MD as PCP - General (Family Medicine)  DIAGNOSIS:  Encounter Diagnosis  Name Primary?  . Malignant neoplasm of upper-outer quadrant of right breast in female, estrogen receptor positive ([place])     SUMMARY OF ONCOLOGIC HISTORY:   Breast cancer of upper-outer quadrant of right female breast ([place])   10/28/2010 Surgery    Right breast lumpectomy 1.8 cm IDC grade 2 with associated DCIS ER 100% PR 100% Ki-67 26% HER-2 -2 SLN negative Oncotype DX 0      12/10/2010 - 01/28/2011 Radiation Therapy    Radiation therapy to lumpectomy site      04/14/2011 - 04/13/2016 Anti-estrogen oral therapy    Tamoxifen 20 mg daily       CHIEF COMPLIANT: Follow-up after completion of tamoxifen therapy  INTERVAL HISTORY: Kathleen [name] is a 76 year old with above-mentioned history of right breast cancer treated with lumpectomy and radiation and 5 years of tamoxifen therapy. We discussed extended adjuvant therapy and found that she has a low risk of recurrence and low likelihood of benefit from extended adjuvant therapy. Because of this she does not need to continue with tamoxifen anymore. She stopped tamoxifen October 2017. She denies any lumps or nodules in the breast. Patient stays very busy volunteering of the [employer] [employer]. 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  REVIEW OF SYSTEMS:   Constitutional: Denies fevers, chills or abnormal weight loss Eyes: Denies blurriness of vision Ears, nose, mouth, throat, and face: Denies mucositis or sore throat Respiratory: Denies cough, dyspnea or wheezes Cardiovascular: Denies palpitation, chest discomfort Gastrointestinal:  Denies nausea, heartburn or change in bowel habits Skin: Denies abnormal skin rashes Lymphatics: Denies Kathleen lymphadenopathy or easy bruising Neurological:Denies numbness, tingling or Kathleen  weaknesses Behavioral/Psych: Mood is stable, no Kathleen changes  Extremities: No lower extremity edema Breast:  denies any pain or lumps or nodules in either breasts All other systems were reviewed with the patient and are negative.  I have reviewed the past medical history, past surgical history, social history and family history with the patient and they are unchanged from previous note.  ALLERGIES:  is allergic to mobic [meloxicam]; naproxen sodium; tape; and doxycycline.  MEDICATIONS:  Current Outpatient Prescriptions  Medication Sig Dispense Refill  . acetaminophen (TYLENOL) 500 MG tablet Take 500 mg by mouth every 6 (six) hours as needed. 1-2 tablets q 4-6 hrs            . aspirin 81 MG tablet Take 81 mg by mouth daily.    . betamethasone dipropionate (DIPROLENE) 0.05 % cream Apply topically 1 day or 1 dose.      . bisoprolol (ZEBETA) 5 MG tablet     . Calcium Carbonate-Vitamin D (CALCIUM-VITAMIN D) 500-200 MG-UNIT per tablet Take 1 tablet by mouth 1 day or 1 dose.      . fexofenadine (ALLEGRA) 180 MG tablet Take 180 mg by mouth daily.     Marland Kitchen FIBER ADULT GUMMIES 2 g CHEW Chew 2 tablets by mouth daily.    . Glucosamine HCl 1500 MG TABS Take 2 tablets by mouth daily.    . hydrochlorothiazide (HYDRODIURIL) 25 MG tablet     . ibuprofen (ADVIL,MOTRIN) 200 MG tablet Take 200 mg by mouth every 6 (six) hours as needed.      Javier Docker Oil 300 MG CAPS Take by mouth daily.      Marland Kitchen  omeprazole (PRILOSEC) 20 MG capsule Take 20 mg by mouth daily.      Marland Kitchen omeprazole-sodium bicarbonate (ZEGERID) 40-1100 MG per capsule Take 1 capsule by mouth daily before breakfast.      . Probiotic Product (PROBIOTIC DAILY PO) Take by mouth.    . simvastatin (ZOCOR) 40 MG tablet Take 40 mg by mouth at bedtime.       No current facility-administered medications for this visit.     PHYSICAL EXAMINATION: ECOG PERFORMANCE STATUS: 1 - Symptomatic but completely ambulatory  Vitals:   12/18/16 1428  BP: (!) 149/59   Pulse: (!) 59  Resp: 18  Temp: 98.1 F (36.7 C)   Filed Weights   12/18/16 1428  Weight: 262 lb 6.4 oz (119 kg)    GENERAL:alert, no distress and comfortable SKIN: skin color, texture, turgor are normal, no rashes or significant lesions EYES: normal, Conjunctiva are pink and non-injected, sclera clear OROPHARYNX:no exudate, no erythema and lips, buccal mucosa, and tongue normal  NECK: supple, thyroid normal size, non-tender, without nodularity LYMPH:  no palpable lymphadenopathy in the cervical, axillary or inguinal LUNGS: clear to auscultation and percussion with normal breathing effort HEART: regular rate & rhythm and no murmurs and no lower extremity edema ABDOMEN:abdomen soft, non-tender and normal bowel sounds MUSCULOSKELETAL:no cyanosis of digits and no clubbing  NEURO: alert & oriented x 3 with fluent speech, no focal motor/sensory deficits EXTREMITIES: No lower extremity edema BREAST: No palpable masses or nodules in either right or left breasts. No palpable axillary supraclavicular or infraclavicular adenopathy no breast tenderness or nipple discharge. (exam performed in the presence of a chaperone)  LABORATORY DATA:  I have reviewed the data as listed   Chemistry      Component Value Date/Time   NA 141 12/14/2014 0747   K 4.2 12/14/2014 0747   CL 101 01/26/2012 1022   CO2 28 12/14/2014 0747   BUN 21.3 12/14/2014 0747   CREATININE 0.9 12/14/2014 0747      Component Value Date/Time   CALCIUM 9.4 12/14/2014 0747   ALKPHOS 52 12/14/2014 0747   AST 16 12/14/2014 0747   ALT 15 12/14/2014 0747   BILITOT 0.40 12/14/2014 0747       Lab Results  Component Value Date   WBC 8.0 12/14/2014   HGB 12.4 12/14/2014   HCT 37.0 12/14/2014   MCV 88.9 12/14/2014   PLT 230 12/14/2014   NEUTROABS 5.4 12/14/2014    ASSESSMENT & PLAN:  Breast cancer of upper-outer quadrant of right female breast ([place]) Right breast cancer stage I ER PR positive HER-2 negative Oncotype DX 0  status post lumpectomy and radiation therapy in 2012. Patient has been on antiestrogen therapy with tamoxifen since October 2012. The plan of treatment was 5 years  Tamoxifen toxicities: Denies any toxicities of tamoxifen Breast cancer index revealed that she has low risk of late recurrence 2.7% and low likelihood of benefit from extended adjuvant therapy. Based on this she completed tamoxifen.  Breast Cancer Surveillance: 1. Breast exam 12/18/2016: Normal 2. Mammogram 12/12/2016 No abnormalities. Postsurgical changes. Breast Density Category B. I recommended that she get 3-D mammograms for surveillance.  RTC in 1 year with survivorship clinic   I spent 25 minutes talking to the patient of which more than half was spent in counseling and coordination of care.  No orders of the defined types were placed in this encounter.  The patient has a good understanding of the overall plan. she agrees with it. she will call  with any problems that may develop before the next visit here.   Rulon Eisenmenger, MD 12/18/16

## 2016-12-18 NOTE — Assessment & Plan Note (Signed)
Right breast cancer stage I ER PR positive HER-2 negative Oncotype DX 0 status post lumpectomy and radiation therapy in 2012. Patient has been on antiestrogen therapy with tamoxifen since October 2012. The plan of treatment was 5 years  Tamoxifen toxicities: Denies any toxicities of tamoxifen Breast cancer index revealed that she has low risk of late recurrence 2.7% and low likelihood of benefit from extended adjuvant therapy. Based on this she completed tamoxifen.  Breast Cancer Surveillance: 1. Breast exam 12/18/2016: Normal 2. Mammogram 12/12/2016 No abnormalities. Postsurgical changes. Breast Density Category B. I recommended that she get 3-D mammograms for surveillance.  RTC in 1 year with survivorship clinic

## 2017-11-02 ENCOUNTER — Other Ambulatory Visit: Payer: Self-pay | Admitting: Hematology and Oncology

## 2017-11-02 DIAGNOSIS — Z1231 Encounter for screening mammogram for malignant neoplasm of breast: Secondary | ICD-10-CM

## 2017-12-16 ENCOUNTER — Ambulatory Visit
Admission: RE | Admit: 2017-12-16 | Discharge: 2017-12-16 | Disposition: A | Discharge status: Home / Self Care | Payer: Medicare Other | Source: Ambulatory Visit | Attending: Hematology and Oncology | Admitting: Hematology and Oncology

## 2017-12-16 DIAGNOSIS — Z1231 Encounter for screening mammogram for malignant neoplasm of breast: Secondary | ICD-10-CM

## 2017-12-21 ENCOUNTER — Encounter: Payer: Self-pay | Admitting: Adult Health

## 2017-12-21 ENCOUNTER — Inpatient Hospital Stay: Payer: Medicare Other | Attending: Adult Health | Admitting: Adult Health

## 2017-12-21 ENCOUNTER — Telehealth: Payer: Self-pay | Admitting: Adult Health

## 2017-12-21 VITALS — BP 163/56 | HR 57 | Temp 97.9°F | Resp 18 | Ht 64.0 in | Wt 262.5 lb

## 2017-12-21 DIAGNOSIS — Z1231 Encounter for screening mammogram for malignant neoplasm of breast: Secondary | ICD-10-CM

## 2017-12-21 DIAGNOSIS — C50411 Malignant neoplasm of upper-outer quadrant of right female breast: Secondary | ICD-10-CM

## 2017-12-21 DIAGNOSIS — Z803 Family history of malignant neoplasm of breast: Secondary | ICD-10-CM

## 2017-12-21 DIAGNOSIS — Z170 Estrogen receptor positive status [ER+]: Secondary | ICD-10-CM

## 2017-12-21 DIAGNOSIS — Z800 Family history of malignant neoplasm of digestive organs: Secondary | ICD-10-CM | POA: Diagnosis not present

## 2017-12-21 DIAGNOSIS — Z923 Personal history of irradiation: Secondary | ICD-10-CM | POA: Diagnosis not present

## 2017-12-21 DIAGNOSIS — Z853 Personal history of malignant neoplasm of breast: Secondary | ICD-10-CM | POA: Insufficient documentation

## 2017-12-21 DIAGNOSIS — R2231 Localized swelling, mass and lump, right upper limb: Secondary | ICD-10-CM

## 2017-12-21 DIAGNOSIS — C189 Malignant neoplasm of colon, unspecified: Secondary | ICD-10-CM

## 2017-12-21 NOTE — Progress Notes (Signed)
CLINIC:  Survivorship   REASON FOR VISIT:  Routine follow-up for history of breast cancer.   BRIEF ONCOLOGIC HISTORY:    Breast cancer of upper-outer quadrant of right female breast ([place])   10/28/2010 Surgery    Right breast lumpectomy 1.8 cm IDC grade 2 with associated DCIS ER 100% PR 100% Ki-67 26% HER-2 -2 SLN negative Oncotype DX 0      12/10/2010 - 01/28/2011 Radiation Therapy    Radiation therapy to lumpectomy site      04/14/2011 - 04/13/2016 Anti-estrogen oral therapy    Tamoxifen 20 mg daily        INTERVAL HISTORY:  Ms. Couse presents to the [facility] today for routine follow-up for her history of breast cancer.  Overall, she reports feeling quite well.   [name] sees Dr. Azalia Bilis with [facility] regularly.  She has regular labs with him.  She is up to date with colon cancer screening, mammograms, skin cancer screening.    [name] walks frequently.  She has no intentional cardiovascular exercise.    [name] notes her right breast seems to get thicker, she isn't quite sure.  She had this evaluated on 7/3 when she underwent her mammogram and was told it was from radiation.    REVIEW OF SYSTEMS:  Review of Systems  Constitutional: Negative for appetite change, chills, fatigue, fever and unexpected weight change.  HENT:   Negative for hearing loss, lump/mass, sore throat and trouble swallowing.   Eyes: Negative for eye problems and icterus.  Respiratory: Negative for chest tightness, cough and shortness of breath.   Cardiovascular: Negative for chest pain, leg swelling and palpitations.  Gastrointestinal: Negative for abdominal distention, abdominal pain, constipation, diarrhea, nausea and vomiting.  Endocrine: Negative for hot flashes.  Skin: Negative for itching.  Neurological: Negative for dizziness, extremity weakness, headaches and numbness.  Hematological: Negative for adenopathy. Does not bruise/bleed easily.  Psychiatric/Behavioral: Negative  for depression. The patient is not nervous/anxious.    Breast: Denies any new nodularity, masses, tenderness, nipple changes, or nipple discharge.       PAST MEDICAL/SURGICAL HISTORY:  Past Medical History:  Diagnosis Date  . Arthritis   . Breast cancer ([place]) 2012  . Cancer ([place])    colon  . Diabetes mellitus   . GERD (gastroesophageal reflux disease)   . Hyperlipidemia   . Hypertension   . Neuromuscular disorder ([place])    back, hip, leg left side  . Personal history of radiation therapy 2012  . Spinal stenosis    Past Surgical History:  Procedure Laterality Date  . ABDOMINAL HYSTERECTOMY    . BREAST EXCISIONAL BIOPSY Left 1972  . BREAST LUMPECTOMY Right 2012  . BREAST SURGERY     biopsy  . COLON SURGERY     partial colectomy  . X-STOP IMPLANTATION  September 26th,2012     ALLERGIES:  Allergies  Allergen Reactions  . Mobic [Meloxicam] Shortness Of Breath    "Wheezing"   . Naproxen Sodium Swelling    Hands and feet  . Tape     Surgical tape adhesive and paper tape   . Doxycycline Itching and Rash     CURRENT MEDICATIONS:  Outpatient Encounter Medications as of 12/21/2017  Medication Sig  . acetaminophen (TYLENOL) 500 MG tablet Take 500 mg by mouth every 6 (six) hours as needed. 1-2 tablets q 4-6 hrs          . aspirin 81 MG tablet Take 81 mg by mouth daily.  Marland Kitchen  betamethasone dipropionate (DIPROLENE) 0.05 % cream Apply topically 1 day or 1 dose.    . bisoprolol (ZEBETA) 5 MG tablet   . Calcium Carbonate-Vitamin D (CALCIUM-VITAMIN D) 500-200 MG-UNIT per tablet Take 1 tablet by mouth 1 day or 1 dose.    . fexofenadine (ALLEGRA) 180 MG tablet Take 180 mg by mouth daily.   Marland Kitchen FIBER ADULT GUMMIES 2 g CHEW Chew 2 tablets by mouth daily.  . Glucosamine HCl 1500 MG TABS Take 2 tablets by mouth daily.  . hydrochlorothiazide (HYDRODIURIL) 25 MG tablet   . ibuprofen (ADVIL,MOTRIN) 200 MG tablet Take 200 mg by mouth every 6 (six) hours as needed.    Javier Docker Oil 300  MG CAPS Take by mouth daily.    Marland Kitchen omeprazole (PRILOSEC) 20 MG capsule Take 20 mg by mouth daily.    Marland Kitchen omeprazole-sodium bicarbonate (ZEGERID) 40-1100 MG per capsule Take 1 capsule by mouth daily before breakfast.    . Probiotic Product (PROBIOTIC DAILY PO) Take by mouth.  . simvastatin (ZOCOR) 40 MG tablet Take 40 mg by mouth at bedtime.     No facility-administered encounter medications on file as of 12/21/2017.      ONCOLOGIC FAMILY HISTORY:  Family History  Problem Relation Age of Onset  . Cancer Mother        breast  . Breast cancer Mother   . Breast cancer Sister     GENETIC COUNSELING/TESTING: Referred today  SOCIAL HISTORY:  Social History   Socioeconomic History  . Marital status: Married    Spouse name: Not on file  . Number of children: Not on file  . Years of education: Not on file  . Highest education level: Not on file  Occupational History  . Not on file  Social Needs  . Financial resource strain: Not on file  . Food insecurity:    Worry: Not on file    Inability: Not on file  . Transportation needs:    Medical: Not on file    Non-medical: Not on file  Tobacco Use  . Smoking status: Former Research scientist (life sciences)  . Smokeless tobacco: Never Used  Substance and Sexual Activity  . Alcohol use: Yes  . Drug use: No  . Sexual activity: Yes  Lifestyle  . Physical activity:    Days per week: Not on file    Minutes per session: Not on file  . Stress: Not on file  Relationships  . Social connections:    Talks on phone: Not on file    Gets together: Not on file    Attends religious service: Not on file    Active member of club or organization: Not on file    Attends meetings of clubs or organizations: Not on file    Relationship status: Not on file  . Intimate partner violence:    Fear of current or ex partner: Not on file    Emotionally abused: Not on file    Physically abused: Not on file    Forced sexual activity: Not on file  Other Topics Concern  . Not on file   Social History Narrative  . Not on file      PHYSICAL EXAMINATION:  Vital Signs: Vitals:   12/21/17 1053  BP: (!) 163/56  Pulse: (!) 57  Resp: 18  Temp: 97.9 F (36.6 C)  SpO2: 96%   Filed Weights   12/21/17 1053  Weight: 262 lb 8 oz (119.1 kg)   General: Well-nourished, well-appearing female in no acute  distress.  Unaccompanied today.   HEENT: Head is normocephalic.  Pupils equal and reactive to light. Conjunctivae clear without exudate.  Sclerae anicteric. Oral mucosa is pink, moist.  Oropharynx is pink without lesions or erythema.  Lymph: No cervical, supraclavicular, or infraclavicular lymphadenopathy noted on palpation.  Cardiovascular: Regular rate and rhythm.Marland Kitchen Respiratory: Clear to auscultation bilaterally. Chest expansion symmetric; breathing non-labored.  Breast Exam:  -Left breast: No appreciable masses on palpation. No skin redness, thickening, or peau d'orange appearance; no nipple retraction or nipple discharge;  -Right breast: No appreciable masses on palpation. No skin redness, or peau d'orange appearance; no nipple retraction or nipple discharge; mild distortion in symmetry at previous lumpectomy site well healed scar without erythema or nodularity.  Thickening noted at upper inner quadrant of breast, no mass noted.  Patient unsure if worse than baseline.   -Axilla: No axillary adenopathy bilaterally.  GI: Abdomen soft and round; non-tender, non-distended. Bowel sounds normoactive. No hepatosplenomegaly.   GU: Deferred.  Neuro: No focal deficits. Steady gait.  Psych: Mood and affect normal and appropriate for situation.  MSK: No focal spinal tenderness to palpation, full range of motion in bilateral upper extremities Extremities: No edema. Skin: Warm and dry.  LABORATORY DATA:  None for this visit   DIAGNOSTIC IMAGING:  Most recent mammogram:      ASSESSMENT AND PLAN:  Ms.. Deol is a pleasant 77 y.o. female with history of Stage IA right breast  invasive ductal carcinoma, ER+/PR+/HER2-, diagnosed in 10/2010, treated with lumpectomy, adjuvant radiation therapy, and anti-estrogen therapy with Tamoxifen x 5 years completing therapy in 03/2016 with BCI testing determining a 2.7% risk of late recurrence, and low likelihood of benefit from extended endocrine therapy.  [facility] for [facility] and routine follow-up.   1. History of breast cancer:  Ms. Pyeatt is currently clinically and radiographically without evidence of disease or recurrence of breast cancer. She will be due for mammogram in 12/2018; orders placed today.  Her sister was recently diagnosed with breast cancer.  This now marks two first degree relatives with breast cancer.  She also has a personal history of colon cancer and breast cancer.  Due to this I set her up for a breast MRI in 06/2018 and she will also be evaluated by genetics.  I encouraged her to call me with any questions or concerns before her next visit at the cancer center, and I would be happy to see her sooner, if needed.    2. Breast density: She feels like the area above her lumpectomy site is thicker.  Her mammogram last week was normal.  She will monitor it, and we will move her MRI up if needed.   3. Right arm swelling: declined referral to physical therapy.  I did write for a lymphedema sleeve and glove today for her anyhow.  She does have h/o taking Tamoxifen.  Due to the duration of the swelling, she will have a d dimer tested next week when she comes in for genetic counseling.  So long as that is negative, she can start wearing the sleeve.    4. Bone health:  Given Ms. Stofko's age, history of breast cancer, she is at risk for bone demineralization.  Her PCP follows her bone density and the last test was normal.  She was given education on specific food and activities to promote bone health.  We will continue to defer to her PCP regarding future bone density testing and management.  5.  Cancer screening:  Due to Ms. Coil's history and her age, she should receive screening for skin cancers, colon cancer. She was encouraged to follow-up with her PCP for appropriate cancer screenings.   6. Health maintenance and wellness promotion: Ms. Murry was encouraged to consume 5-7 servings of fruits and vegetables per day. She was also encouraged to engage in moderate to vigorous exercise for 30 minutes per day most days of the week. She was instructed to limit her alcohol consumption and continue to abstain from tobacco use.     Dispo:  -Return to cancer center in one year for LTS follow up -Referral to genetics -Annual MRI breasts -Annual Mammogram   A total of (30) minutes of face-to-face time was spent with this patient with greater than 50% of that time in counseling and care-coordination.   Gardenia Phlegm, [place] [phone]   Note: PRIMARY CARE PROVIDER [name], [place] [phone]

## 2017-12-21 NOTE — Telephone Encounter (Signed)
Gave avs and calendar ° °

## 2017-12-21 NOTE — Patient Instructions (Signed)
Bone Health Bones protect organs, store calcium, and anchor muscles. Good health habits, such as eating nutritious foods and exercising regularly, are important for maintaining healthy bones. They can also help to prevent a condition that causes bones to lose density and become weak and brittle (osteoporosis). Why is bone mass important? Bone mass refers to the amount of bone tissue that you have. The higher your bone mass, the stronger your bones. An important step toward having healthy bones throughout life is to have strong and dense bones during childhood. A young adult who has a high bone mass is more likely to have a high bone mass later in life. Bone mass at its greatest it is called peak bone mass. A large decline in bone mass occurs in older adults. In women, it occurs about the time of menopause. During this time, it is important to practice good health habits, because if more bone is lost than what is replaced, the bones will become less healthy and more likely to break (fracture). If you find that you have a low bone mass, you may be able to prevent osteoporosis or further bone loss by changing your diet and lifestyle. How can I find out if my bone mass is low? Bone mass can be measured with an X-ray test that is called a bone mineral density (BMD) test. This test is recommended for all women who are age 65 or older. It may also be recommended for men who are age 70 or older, or for people who are more likely to develop osteoporosis due to:  Having bones that break easily.  Having a long-term disease that weakens bones, such as kidney disease or rheumatoid arthritis.  Having menopause earlier than normal.  Taking medicine that weakens bones, such as steroids, thyroid hormones, or hormone treatment for breast cancer or prostate cancer.  Smoking.  Drinking three or more alcoholic drinks each day.  What are the nutritional recommendations for healthy bones? To have healthy bones, you  need to get enough of the right minerals and vitamins. Most nutrition experts recommend getting these nutrients from the foods that you eat. Nutritional recommendations vary from person to person. Ask your health care provider what is healthy for you. Here are some general guidelines. Calcium Recommendations Calcium is the most important (essential) mineral for bone health. Most people can get enough calcium from their diet, but supplements may be recommended for people who are at risk for osteoporosis. Good sources of calcium include:  Dairy products, such as low-fat or nonfat milk, cheese, and yogurt.  Dark green leafy vegetables, such as bok choy and broccoli.  Calcium-fortified foods, such as orange juice, cereal, bread, soy beverages, and tofu products.  Nuts, such as almonds.  Follow these recommended amounts for daily calcium intake:  Children, age 1?3: 700 mg.  Children, age 4?8: 1,000 mg.  Children, age 9?13: 1,300 mg.  Teens, age 14?18: 1,300 mg.  Adults, age 19?50: 1,000 mg.  Adults, age 51?70: ? Men: 1,000 mg. ? Women: 1,200 mg.  Adults, age 71 or older: 1,200 mg.  Pregnant and breastfeeding females: ? Teens: 1,300 mg. ? Adults: 1,000 mg.  Vitamin D Recommendations Vitamin D is the most essential vitamin for bone health. It helps the body to absorb calcium. Sunlight stimulates the skin to make vitamin D, so be sure to get enough sunlight. If you live in a cold climate or you do not get outside often, your health care provider may recommend that you take vitamin   D supplements. Good sources of vitamin D in your diet include:  Egg yolks.  Saltwater fish.  Milk and cereal fortified with vitamin D.  Follow these recommended amounts for daily vitamin D intake:  Children and teens, age 1?18: 600 international units.  Adults, age 50 or younger: 400-800 international units.  Adults, age 51 or older: 800-1,000 international units.  Other Nutrients Other nutrients  for bone health include:  Phosphorus. This mineral is found in meat, poultry, dairy foods, nuts, and legumes. The recommended daily intake for adult men and adult women is 700 mg.  Magnesium. This mineral is found in seeds, nuts, dark green vegetables, and legumes. The recommended daily intake for adult men is 400?420 mg. For adult women, it is 310?320 mg.  Vitamin K. This vitamin is found in green leafy vegetables. The recommended daily intake is 120 mg for adult men and 90 mg for adult women.  What type of physical activity is best for building and maintaining healthy bones? Weight-bearing and strength-building activities are important for building and maintaining peak bone mass. Weight-bearing activities cause muscles and bones to work against gravity. Strength-building activities increases muscle strength that supports bones. Weight-bearing and muscle-building activities include:  Walking and hiking.  Jogging and running.  Dancing.  Gym exercises.  Lifting weights.  Tennis and racquetball.  Climbing stairs.  Aerobics.  Adults should get at least 30 minutes of moderate physical activity on most days. Children should get at least 60 minutes of moderate physical activity on most days. Ask your health care provide what type of exercise is best for you. Where can I find more information? For more information, check out the following websites:  National Osteoporosis Foundation: http://nof.org/learn/basics  National Institutes of Health: http://www.niams.nih.gov/Health_Info/Bone/Bone_Health/bone_health_for_life.asp  This information is not intended to replace advice given to you by your health care provider. Make sure you discuss any questions you have with your health care provider. Document Released: 08/23/2003 Document Revised: 12/21/2015 Document Reviewed: 06/07/2014 Elsevier Interactive Patient Education  2018 [employer]  

## 2017-12-28 ENCOUNTER — Inpatient Hospital Stay: Payer: Medicare Other

## 2017-12-28 ENCOUNTER — Inpatient Hospital Stay (HOSPITAL_BASED_OUTPATIENT_CLINIC_OR_DEPARTMENT_OTHER): Payer: Medicare Other | Admitting: Genetics

## 2017-12-28 DIAGNOSIS — Z853 Personal history of malignant neoplasm of breast: Secondary | ICD-10-CM | POA: Diagnosis not present

## 2017-12-28 DIAGNOSIS — Z800 Family history of malignant neoplasm of digestive organs: Secondary | ICD-10-CM

## 2017-12-28 DIAGNOSIS — Z170 Estrogen receptor positive status [ER+]: Secondary | ICD-10-CM

## 2017-12-28 DIAGNOSIS — Z7183 Encounter for nonprocreative genetic counseling: Secondary | ICD-10-CM

## 2017-12-28 DIAGNOSIS — Z803 Family history of malignant neoplasm of breast: Secondary | ICD-10-CM

## 2017-12-28 DIAGNOSIS — C50411 Malignant neoplasm of upper-outer quadrant of right female breast: Secondary | ICD-10-CM

## 2017-12-28 DIAGNOSIS — C189 Malignant neoplasm of colon, unspecified: Secondary | ICD-10-CM

## 2017-12-29 ENCOUNTER — Encounter: Payer: Self-pay | Admitting: Genetics

## 2017-12-29 DIAGNOSIS — Z803 Family history of malignant neoplasm of breast: Secondary | ICD-10-CM | POA: Insufficient documentation

## 2017-12-29 DIAGNOSIS — Z800 Family history of malignant neoplasm of digestive organs: Secondary | ICD-10-CM | POA: Insufficient documentation

## 2017-12-29 NOTE — Progress Notes (Signed)
REFERRING PROVIDER: Gardenia Phlegm, NP [address] [place], [place] [address]  PRIMARY PROVIDER:  [name], MD  PRIMARY REASON FOR VISIT:  1. Malignant neoplasm of upper-outer quadrant of right breast in female, estrogen receptor positive ([place])   2. Family history of breast cancer   3. Family history of colon cancer   4. Malignant neoplasm of colon, unspecified part of colon ([place])     HISTORY OF PRESENT ILLNESS:   Kathleen [name], a 77 y.o. female, was seen for a [place] cancer genetics consultation at the request of Dr. Delice Bison due to a personal and family history of cancer.  [name] presents to clinic today to discuss the possibility of a hereditary predisposition to cancer, genetic testing, and to further clarify her future cancer risks, as well as potential cancer risks for family members.   In 2012, at the age of 38, [name] was diagnosed with invasive ductal carcinoma of the right breast ER/PR +, HER2-.  She underwent lumpectomy in 2012.  She also has a history of colon cancer dx in 2008 at the age of 44. She had a partial colectomy for this.    She has a history of left lumpecty in 1972 that was a benign lump.    CANCER HISTORY:    Breast cancer of upper-outer quadrant of right female breast ([place])   10/28/2010 Surgery    Right breast lumpectomy 1.8 cm IDC grade 2 with associated DCIS ER 100% PR 100% Ki-67 26% HER-2 -2 SLN negative Oncotype DX 0      12/10/2010 - 01/28/2011 Radiation Therapy    Radiation therapy to lumpectomy site      04/14/2011 - 04/13/2016 Anti-estrogen oral therapy    Tamoxifen 20 mg daily        HORMONAL RISK FACTORS:  Menarche was at age 28.  First live birth at age 33.  Ovaries intact: no.  Hysterectomy: yes.  Menopausal status: postmenopausal, after hysterectomy at age 22.  HRT use: for several years after hysterectomy years. Colonoscopy: yes; hx of colon cancer, no other polyps reported, goes ever 5 years for  c-scope. Mammogram within the last year: yes.   Past Medical History:  Diagnosis Date  . Arthritis   . Breast cancer ([place]) 2012  . Cancer ([place])    colon  . Diabetes mellitus   . Family history of breast cancer   . Family history of colon cancer   . GERD (gastroesophageal reflux disease)   . Hyperlipidemia   . Hypertension   . Neuromuscular disorder ([place])    back, hip, leg left side  . Personal history of radiation therapy 2012  . Spinal stenosis     Past Surgical History:  Procedure Laterality Date  . ABDOMINAL HYSTERECTOMY    . BREAST EXCISIONAL BIOPSY Left 1972  . BREAST LUMPECTOMY Right 2012  . BREAST SURGERY     biopsy  . COLON SURGERY     partial colectomy  . X-STOP IMPLANTATION  September 26th,2012    Social History   Socioeconomic History  . Marital status: Married    Spouse name: Not on file  . Number of children: Not on file  . Years of education: Not on file  . Highest education level: Not on file  Occupational History  . Not on file  Social Needs  . Financial resource strain: Not on file  . Food insecurity:    Worry: Not on file    Inability: Not on file  .  Transportation needs:    Medical: Not on file    Non-medical: Not on file  Tobacco Use  . Smoking status: Former Research scientist (life sciences)  . Smokeless tobacco: Never Used  Substance and Sexual Activity  . Alcohol use: Yes  . Drug use: No  . Sexual activity: Yes  Lifestyle  . Physical activity:    Days per week: Not on file    Minutes per session: Not on file  . Stress: Not on file  Relationships  . Social connections:    Talks on phone: Not on file    Gets together: Not on file    Attends religious service: Not on file    Active member of club or organization: Not on file    Attends meetings of clubs or organizations: Not on file    Relationship status: Not on file  Other Topics Concern  . Not on file  Social History Narrative  . Not on file     FAMILY HISTORY:  We obtained a detailed,  4-generation family history.  Significant diagnoses are listed below: Family History  Problem Relation Age of Onset  . Breast cancer Mother        dx late 87's  . Breast cancer Sister 61  . Heart disease Father   . Heart disease Maternal Grandmother   . Colon cancer Cousin        dx 14+  . Kidney cancer Son 64   [name] has 2 sons ages 52 and 37.  Her youngest son was recently diagnosed with kidney cancer. He has 2 children. [name] has 3 sisters and 2 brothers: -1 sister is 62 and was diagnosed with breast cancer at the age of 71.   No other siblings with any history of cancer.   [name] father: died at 80 due to heart disease.  Paternal Aunts/Uncles: 2 paternal aunts with no history of cancer. Patient also has 10 paternal half-aunts/uncles and does not know information about these realtives' health.  Paternal cousins: 1 paternal cousin was dx with colon cancer at 28 or older.  Paternal grandfather: no history of cancer Paternal grandmother:no history of cancer  [name]'s mother: dx with breast cancer in her late 69's, died at 89 Maternal Aunts/Uncles: 3 maternal aunts, 1 maternal uncle (died as a baby) with no history of cancer.  Maternal cousins: no history of cancer Maternal grandfather: died at 89 with no history of cancer Maternal grandmother: died at 30 due to heart disease  Ms. Eisenmenger is unaware of previous family history of genetic testing for hereditary cancer risks. Patient's maternal ancestors are of N. European descent, and paternal ancestors are of N. European descent. There is no reported Ashkenazi Jewish ancestry. There is known consanguinity- Ms. Manville's maternal grandparents are distant cousins to each other.   GENETIC COUNSELING ASSESSMENT: [name] is a 77 y.o. female with a personal and family history which is somewhat suggestive of a Hereditary Cancer Predisposition Syndrome. We, therefore, discussed and recommended the following at today's visit.    DISCUSSION: We reviewed the characteristics, features and inheritance patterns of hereditary cancer syndromes. We also discussed genetic testing, including the appropriate family members to test, the process of testing, insurance coverage and turn-around-time for results. We discussed the implications of a negative, positive and/or variant of uncertain significant result. We recommended Ms. Lantigua pursue genetic testing for the Common Hereditary Cancers gene panel + Renal/Urinary Tract Cancers Panel.   The Common Hereditary Cancer Panel offered by [facility]  includes sequencing and/or deletion duplication testing of the following 47 genes: APC, ATM, AXIN2, BARD1, BMPR1A, BRCA1, BRCA2, BRIP1, CDH1, CDKN2A (p14ARF), CDKN2A (p16INK4a), CKD4, CHEK2, CTNNA1, DICER1, EPCAM (Deletion/duplication testing only), GREM1 (promoter region deletion/duplication testing only), KIT, MEN1, MLH1, MSH2, MSH3, MSH6, MUTYH, NBN, NF1, NHTL1, PALB2, PDGFRA, PMS2, POLD1, POLE, PTEN, RAD50, RAD51C, RAD51D, SDHB, SDHC, SDHD, SMAD4, SMARCA4. STK11, TP53, TSC1, TSC2, and VHL.  The following genes were evaluated for sequence changes only: SDHA and HOXB13 c.251G>A variant only.  Renal/Urinarty Tract Cancers Panel:  BAP1 CDC73 CDKN1C DICER1 DIS3L2 EPCAM FH FLCN GPC3 MET MLH1 MSH2 MSH6 PMS2 PTEN SDHB SDHC SMARCA4 SMARCB1 TP53 TSC1 TSC2 VHL WT1   We discussed that only 5-10% of cancers are associated with a Hereditary cancer predisposition syndrome.  One of the most common hereditary cancer syndromes that increases breast cancer risk is called Hereditary Breast and Ovarian Cancer (HBOC) syndrome.  This syndrome is caused by mutations in the BRCA1 and BRCA2 genes.  This syndrome increases an individual's lifetime risk to develop breast, ovarian, pancreatic, and other types of cancer.  There are also many other cancer predisposition syndromes caused by mutations in several other genes.  We discussed that if she is found to have a mutation in  one of these genes, it may impact future medical management recommendations such as increased cancer screenings and consideration of risk reducing surgeries.  A positive result could also have implications for the patient's family members.  A Negative result would mean we were unable to identify a hereditary component to her cancer, but does not rule out the possibility of a hereditary basis for her cancer.  There could be mutations that are undetectable by current technology, or in genes not yet tested or identified to increase cancer risk.    We discussed the potential to find a Variant of Uncertain Significance or VUS.  These are variants that have not yet been identified as pathogenic or benign, and it is unknown if this variant is associated with increased cancer risk or if this is a normal finding.  Most VUS's are reclassified to benign or likely benign.   It should not be used to make medical management decisions. With time, we suspect the lab will determine the significance of any VUS's identified if any.   Based on Ms. Klugh's personal and family history of cancer, she meets medical criteria for genetic testing. Despite that she meets criteria, she may still have an out of pocket cost. The laboratory can provide her with an estimate of her OOP cost.   PLAN: After considering the risks, benefits, and limitations, Ms. Savage  provided informed consent to pursue genetic testing and the saliva sample was sent to [facility] for analysis of the Common Heredtiary Cancer Panel + Renal/Urinary Tract Cancers Panel. Results should be available within approximately 2-3 weeks' time, at which point they will be disclosed by telephone to Ms. Schwimmer, as will any additional recommendations warranted by these results. Ms. Blackson will receive a summary of her genetic counseling visit and a copy of her results once available. This information will also be available in Epic. We encouraged Ms. Wailes to remain in  contact with cancer genetics annually so that we can continuously update the family history and inform her of any changes in cancer genetics and testing that may be of benefit for her family. Ms. Emme questions were answered to her satisfaction today. Our contact information was provided should additional questions or concerns arise.  Lastly, we  encouraged Ms. Habel to remain in contact with cancer genetics annually so that we can continuously update the family history and inform her of any changes in cancer genetics and testing that may be of benefit for this family.   Ms.  Bejar questions were answered to her satisfaction today. Our contact information was provided should additional questions or concerns arise. Thank you for the referral and allowing [name] to share in the care of your patient.   Tana Felts, MS, [facility] Certified Genetic Counselor [email or web address] phone: [phone]  The patient was seen for a total of 40 minutes in face-to-face genetic counseling.  * This patient was discussed with Drs. Magrinat, Lindi Adie and/or Burr Medico who agrees with the above.

## 2018-01-15 ENCOUNTER — Telehealth: Payer: Self-pay | Admitting: Genetics

## 2018-01-15 NOTE — Telephone Encounter (Signed)
Revealed negative genetic testing.  Revealed that a VUS in VHL was identified.   This normal result means we did not identify a genetic cause for Kathleen [name]'s personal and family history of cancer.  It is unlikely that there is an increased risk of another cancer due to a mutation in one of these genes.  However, genetic testing is not perfect, and cannot definitively rule out a hereditary cause.  It will be important for her to keep in contact with genetics to learn if any additional testing may be needed in the future.     We recommended her siblings and sons have colonoscopy every 5 years starting by age 62.    She will receive a letter if her VUS is ever reclassified in the future.

## 2018-01-18 ENCOUNTER — Ambulatory Visit: Payer: Self-pay | Admitting: Genetics

## 2018-01-18 ENCOUNTER — Encounter: Payer: Self-pay | Admitting: Genetics

## 2018-01-18 DIAGNOSIS — C50411 Malignant neoplasm of upper-outer quadrant of right female breast: Secondary | ICD-10-CM

## 2018-01-18 DIAGNOSIS — Z800 Family history of malignant neoplasm of digestive organs: Secondary | ICD-10-CM

## 2018-01-18 DIAGNOSIS — C189 Malignant neoplasm of colon, unspecified: Secondary | ICD-10-CM

## 2018-01-18 DIAGNOSIS — Z170 Estrogen receptor positive status [ER+]: Secondary | ICD-10-CM

## 2018-01-18 DIAGNOSIS — Z1379 Encounter for other screening for genetic and chromosomal anomalies: Secondary | ICD-10-CM

## 2018-01-18 DIAGNOSIS — Z803 Family history of malignant neoplasm of breast: Secondary | ICD-10-CM

## 2018-01-18 NOTE — Progress Notes (Signed)
HPI:  Ms. Serpas was previously seen in the [facility] clinic on 12/28/2017 due to a personal and family history of cancer and concerns regarding a hereditary predisposition to cancer. Please refer to our prior cancer genetics clinic note for more information regarding Ms. Bayless's medical, social and family histories, and our assessment and recommendations, at the time. Ms. Boise recent genetic test results were disclosed to her, as well as recommendations warranted by these results. These results and recommendations are discussed in more detail below.  CANCER HISTORY:    Breast cancer of upper-outer quadrant of right female breast ([facility])   10/28/2010 Surgery    Right breast lumpectomy 1.8 cm IDC grade 2 with associated DCIS ER 100% PR 100% Ki-67 26% HER-2 -2 SLN negative Oncotype DX 0      12/10/2010 - 01/28/2011 Radiation Therapy    Radiation therapy to lumpectomy site      04/14/2011 - 04/13/2016 Anti-estrogen oral therapy    Tamoxifen 20 mg daily        FAMILY HISTORY:  We obtained a detailed, 4-generation family history.  Significant diagnoses are listed below: Family History  Problem Relation Age of Onset  . Breast cancer Mother        dx late 24's  . Breast cancer Sister 64  . Heart disease Father   . Heart disease Maternal Grandmother   . Colon cancer Cousin        dx 62+  . Kidney cancer Son 18    Ms. Montufar has 2 sons ages 27 and 88.  Her youngest son was recently diagnosed with kidney cancer. He has 2 children. Ms. Pro has 3 sisters and 2 brothers: -1 sister is 33 and was diagnosed with breast cancer at the age of 37.   No other siblings with any history of cancer.   Ms. Dershem father: died at 46 due to heart disease.  Paternal Aunts/Uncles: 2 paternal aunts with no history of cancer. Patient also has 10 paternal half-aunts/uncles and does not know information about these realtives' health.  Paternal cousins: 1 paternal cousin was dx with colon cancer  at 45 or older.  Paternal grandfather: no history of cancer Paternal grandmother:no history of cancer  Ms. Kareem's mother: dx with breast cancer in her late 74's, died at 91 Maternal Aunts/Uncles: 3 maternal aunts, 1 maternal uncle (died as a baby) with no history of cancer.  Maternal cousins: no history of cancer Maternal grandfather: died at 69 with no history of cancer Maternal grandmother: died at 37 due to heart disease  Ms. Koppel is unaware of previous family history of genetic testing for hereditary cancer risks. Patient's maternal ancestors are of N. European descent, and paternal ancestors are of N. European descent. There is no reported Ashkenazi Jewish ancestry. There is known consanguinity- Ms. Gallery's maternal grandparents are distant cousins to each other.    GENETIC TEST RESULTS: Genetic testing performed through [facility] Common Hereditary Cancers Panel + Renal/Urinary tract cancer panel reported out on 01/07/2018 showed no pathogenic mutations. The following genes were evaluated for sequence changes and exonic deletions/duplications:APC, ATM, AXIN2, BAP1, BARD1, BLM, BMPR1A, BRCA1, BRCA2, BRIP1, BUB1B, CDC73, CDH1, CDK4, CDKN1C, CDKN2A (p14ARF),CDKN2A (p16INK4a), CEP57, CHEK2, CTNNA1, DICER1, DIS3L2, ENG, EPCAM*, FH, FLCN, GALNT12, GPC3, GREM1*, HOXB13,KIT, MEN1, MET, MLH1, MLH3, MSH2, MSH3, MSH6, MUTYH, NBN, NF1, NTHL1, PALB2, PDGFRA, PMS2, POLD1, POLE, PTEN, RAD50, RAD51C, RAD51D, RNF43, RPS20, SDHB, SDHC, SDHD, SMAD4, SMARCA4, SMARCB1, STK11, TP53, TSC1, TSC2, VHL,WT1The following genes were evaluated for  sequence changes only:SDHA.  A variant of uncertain significance (VUS) in a gene called VHL was also noted. c.629G>A (p.Arg210Gln)  The test report will be scanned into EPIC and will be located under the Molecular Pathology section of the Results Review tab. A portion of the result report is included below for reference.     We discussed with Ms. Bulluck that because current  genetic testing is not perfect, it is possible there may be a gene mutation in one of these genes that current testing cannot detect, but that chance is small.  We also discussed, that there could be another gene that has not yet been discovered, or that we have not yet tested, that is responsible for the cancer diagnoses in the family. It is also possible there is a hereditary cause for the cancer in the family that Ms. Kanan did not inherit and therefore was not identified in her testing.  Therefore, it is important to remain in touch with cancer genetics in the future so that we can continue to offer Ms. Kaiser the most up to date genetic testing.   Regarding the VUS in VHL: At this time, it is unknown if this variant is associated with increased cancer risk or if this is a normal finding, but most variants such as this get reclassified to being inconsequential. It should not be used to make medical management decisions. With time, we suspect the lab will determine the significance of this variant, if any. If we do learn more about it, we will try to contact Ms. Cuffie to discuss it further. However, it is important to stay in touch with [name] periodically and keep the address and phone number up to date.  ADDITIONAL GENETIC TESTING: We discussed with Ms. Starkey that her genetic testing was fairly extensive.  If there are are genes identified to increase cancer risk that can be analyzed in the future, we would be happy to discuss and coordinate this testing at that time.    CANCER SCREENING RECOMMENDATIONS: This negative result means that we were unable to identify a hereditary cause for her personal and family history of cancer at this time.  While reassuring, this result does not rule out a hereditary cause for her cancer.   It is still possible that there could be genetic mutations that are undetectable by current technology, or genetic mutations in genes that have not been tested or identified to increase  cancer risk.  Therefore, it is recommended she continue to follow the cancer management and screening guidelines provided by her oncology and primary healthcare provider. An individual's cancer risk is not determined by genetic test results alone.  Overall cancer risk assessment includes additional factors such as personal medical history, family history, etc.  These should be used to make a personalized plan for cancer prevention and surveillance.    RECOMMENDATIONS FOR FAMILY MEMBERS:  Relatives in this family might be at some increased risk of developing cancer, over the general population risk, simply due to the family history of cancer.  We recommended women in this family have a yearly mammogram beginning at age 38, or 67 years younger than the earliest onset of cancer, an annual clinical breast exam, and perform monthly breast self-exams. Women in this family should also have a gynecological exam as recommended by their primary provider. All family members should have a colonoscopy by age 57 (or as directed by their doctors).  All family members should inform their physicians about the family history of  cancer so their doctors can make the most appropriate screening recommendations for them.   We recommended her sons and siblings have colonoscopies every 5 years starting by age 2 or as directed by their doctors.   FOLLOW-UP: Lastly, we discussed with Ms. Mangino that cancer genetics is a rapidly advancing field and it is possible that new genetic tests will be appropriate for her and/or her family members in the future. We encouraged her to remain in contact with cancer genetics on an annual basis so we can update her personal and family histories and let her know of advances in cancer genetics that may benefit this family.   Our contact number was provided. Ms. Nagele questions were answered to her satisfaction, and she knows she is welcome to call us at anytime with additional questions or  concerns.   Ferol Luz, MS, [facility] Certified Genetic Counselor [email or web address]

## 2018-04-08 ENCOUNTER — Encounter (INDEPENDENT_AMBULATORY_CARE_PROVIDER_SITE_OTHER): Payer: Self-pay

## 2018-04-12 ENCOUNTER — Ambulatory Visit (INDEPENDENT_AMBULATORY_CARE_PROVIDER_SITE_OTHER): Payer: Medicare Other | Admitting: Family Medicine

## 2018-04-12 ENCOUNTER — Encounter (INDEPENDENT_AMBULATORY_CARE_PROVIDER_SITE_OTHER): Payer: Self-pay | Admitting: Family Medicine

## 2018-04-12 VITALS — BP 161/78 | HR 58 | Temp 98.1°F | Ht 64.0 in | Wt 259.0 lb

## 2018-04-12 DIAGNOSIS — E559 Vitamin D deficiency, unspecified: Secondary | ICD-10-CM

## 2018-04-12 DIAGNOSIS — R5383 Other fatigue: Secondary | ICD-10-CM

## 2018-04-12 DIAGNOSIS — R0602 Shortness of breath: Secondary | ICD-10-CM

## 2018-04-12 DIAGNOSIS — Z1331 Encounter for screening for depression: Secondary | ICD-10-CM

## 2018-04-12 DIAGNOSIS — Z6841 Body Mass Index (BMI) 40.0 and over, adult: Secondary | ICD-10-CM

## 2018-04-12 DIAGNOSIS — Z0289 Encounter for other administrative examinations: Secondary | ICD-10-CM

## 2018-04-12 DIAGNOSIS — I10 Essential (primary) hypertension: Secondary | ICD-10-CM

## 2018-04-12 DIAGNOSIS — E119 Type 2 diabetes mellitus without complications: Secondary | ICD-10-CM | POA: Diagnosis not present

## 2018-04-13 LAB — CBC WITH DIFFERENTIAL
BASOS: 1 %
Basophils Absolute: 0 10*3/uL (ref 0.0–0.2)
EOS (ABSOLUTE): 0.2 10*3/uL (ref 0.0–0.4)
Eos: 2 %
HEMATOCRIT: 37.8 % (ref 34.0–46.6)
Hemoglobin: 12.8 g/dL (ref 11.1–15.9)
Immature Grans (Abs): 0 10*3/uL (ref 0.0–0.1)
Immature Granulocytes: 0 %
LYMPHS: 28 %
Lymphocytes Absolute: 2 10*3/uL (ref 0.7–3.1)
MCH: 30 pg (ref 26.6–33.0)
MCHC: 33.9 g/dL (ref 31.5–35.7)
MCV: 89 fL (ref 79–97)
MONOS ABS: 0.4 10*3/uL (ref 0.1–0.9)
Monocytes: 6 %
NEUTROS ABS: 4.5 10*3/uL (ref 1.4–7.0)
NEUTROS PCT: 63 %
RBC: 4.27 x10E6/uL (ref 3.77–5.28)
RDW: 12.4 % (ref 12.3–15.4)
WBC: 7.1 10*3/uL (ref 3.4–10.8)

## 2018-04-13 LAB — LIPID PANEL WITH LDL/HDL RATIO
CHOLESTEROL TOTAL: 155 mg/dL (ref 100–199)
HDL: 60 mg/dL (ref >39)
LDL CALC: 70 mg/dL (ref 0–99)
LDl/HDL Ratio: 1.2 ratio (ref 0.0–3.2)
Triglycerides: 123 mg/dL (ref 0–149)
VLDL CHOLESTEROL CAL: 25 mg/dL (ref 5–40)

## 2018-04-13 LAB — COMPREHENSIVE METABOLIC PANEL
ALBUMIN: 4.4 g/dL (ref 3.5–4.8)
ALT: 20 IU/L (ref 0–32)
AST: 17 IU/L (ref 0–40)
Albumin/Globulin Ratio: 1.6 (ref 1.2–2.2)
Alkaline Phosphatase: 76 IU/L (ref 39–117)
BUN / CREAT RATIO: 23 (ref 12–28)
BUN: 18 mg/dL (ref 8–27)
Bilirubin Total: 0.4 mg/dL (ref 0.0–1.2)
CALCIUM: 9.5 mg/dL (ref 8.7–10.3)
CO2: 25 mmol/L (ref 20–29)
CREATININE: 0.79 mg/dL (ref 0.57–1.00)
Chloride: 97 mmol/L (ref 96–106)
GFR calc Af Amer: 84 mL/min/{1.73_m2} (ref >59)
GFR, EST NON AFRICAN AMERICAN: 73 mL/min/{1.73_m2} (ref >59)
GLOBULIN, TOTAL: 2.8 g/dL (ref 1.5–4.5)
Glucose: 138 mg/dL — ABNORMAL HIGH (ref 65–99)
Potassium: 4 mmol/L (ref 3.5–5.2)
SODIUM: 138 mmol/L (ref 134–144)
TOTAL PROTEIN: 7.2 g/dL (ref 6.0–8.5)

## 2018-04-13 LAB — INSULIN, RANDOM: INSULIN: 30.6 u[IU]/mL — ABNORMAL HIGH (ref 2.6–24.9)

## 2018-04-13 LAB — HEMOGLOBIN A1C
Est. average glucose Bld gHb Est-mCnc: 148 mg/dL
Hgb A1c MFr Bld: 6.8 % — ABNORMAL HIGH (ref 4.8–5.6)

## 2018-04-13 LAB — T3: T3 TOTAL: 127 ng/dL (ref 71–180)

## 2018-04-13 LAB — VITAMIN D 25 HYDROXY (VIT D DEFICIENCY, FRACTURES): Vit D, 25-Hydroxy: 25.2 ng/mL — ABNORMAL LOW (ref 30.0–100.0)

## 2018-04-13 LAB — T4, FREE: Free T4: 1.03 ng/dL (ref 0.82–1.77)

## 2018-04-13 LAB — TSH: TSH: 1.48 u[IU]/mL (ref 0.450–4.500)

## 2018-04-13 NOTE — Progress Notes (Signed)
.  Office: [phone]  /  Fax: [phone]   HPI:   Chief Complaint: Kathleen [name] (MR# [id number]) is a 77 y.o. female who presents on 04/13/2018 for obesity evaluation and treatment. Current BMI is Body mass index is 44.46 kg/m.. Kathleen [name] has struggled with obesity for years and has been unsuccessful in either losing weight or maintaining long term weight loss. Kathleen [name] is lactose intolerant. Kathleen [name] heard about our clinic from her daughter-in-law. Kathleen [name] attended our information session and states she is currently in the action stage of change and ready to dedicate time achieving and maintaining a healthier weight.  Kathleen [name] states her family eats meals together she thinks her family will eat healthier with  her her desired weight loss is 65 lbs or more she has been heavy most of  her life she started gaining weight in 1974 after the birth of her 2nd child her heaviest weight ever was 285 lbs. she has significant food cravings issues  she frequently makes poor food choices she frequently eats larger portions than normal  she struggles with emotional eating    Fatigue Kathleen [name] feels her energy is lower than it should be. This has worsened with weight gain and has not worsened recently. Kathleen [name] admits to daytime somnolence and denies waking up still tired. Patient has a history of obstructive sleep apnea which may contribute to her fatigue. Patent has a history of symptoms of daytime fatigue and hypertension. Patient generally gets 7 hours of sleep per night, and states they generally have restful sleep. Snoring is not present with CPAP. Apneic episodes are not present with CPAP. Epworth Sleepiness Score is 4  EKG was ordered today which shows first degree A-V block.  Dyspnea on exertion Kathleen [name] notes increasing shortness of breath with exercising and seems to be worsening over time with weight gain. She notes getting out of breath sooner with activity than she used to.  This has not gotten worse recently. EKG was ordered today which shows first degree A-V block. Kathleen [name] denies orthopnea.  Diabetes II Kathleen [name] has a diagnosis of diabetes type II. Kathleen [name] is not on medications.Kathleen [name] had a Hgb A1c of 6.7 in 2015 (most recent Hgb A1c was at 6.4). She is attempting to work on intensive lifestyle modifications including diet, exercise, and weight loss to help control her blood glucose levels.  Vitamin D deficiency Kathleen [name] has a diagnosis of vitamin D deficiency. She is on OTC vit D supplement and denies nausea, vomiting or muscle weakness.  Hypertension Kathleen [name] is a 77 y.o. female with hypertension. Her blood [name] is 161/78 today and she didn't take her medications. Kathleen [name] B Bonus denies chest pain, chest [name] or headache. She is working weight loss to help control her blood [name] with the goal of decreasing her risk of heart attack and stroke. Kathleen [name] is uncontrolled.  Depression Screen Kathleen [name]'s Food and Mood (modified PHQ-9) score was  Depression screen PHQ 2/9 04/12/2018  Decreased Interest 1  Down, Depressed, Hopeless 1  PHQ - 2 Score 2  Altered sleeping 0  Tired, decreased energy 3  Change in appetite 1  Feeling bad or failure about yourself  1  Trouble concentrating 0  Moving slowly or fidgety/restless 0  Suicidal thoughts 0  PHQ-9 Score 7  Difficult doing work/chores Not difficult at all    ALLERGIES: Allergies  Allergen Reactions  . Mobic [Meloxicam] Shortness Of Breath    "Wheezing"   . Naproxen Sodium Swelling    Hands  and feet  . Tape     Surgical tape adhesive and paper tape   . Doxycycline Itching and Rash    MEDICATIONS: Current Outpatient Medications on File Prior to Visit  Medication Sig Dispense Refill  . acetaminophen (TYLENOL) 500 MG tablet Take 500 mg by mouth every 6 (six) hours as needed. 1-2 tablets q 4-6 hrs            . aspirin 81 MG tablet Take 81 mg by mouth  daily.    . betamethasone dipropionate (DIPROLENE) 0.05 % cream Apply topically 1 day or 1 dose.      . bisoprolol (ZEBETA) 5 MG tablet     . Calcium Carbonate-Vitamin D (CALCIUM-VITAMIN D) 500-200 MG-UNIT per tablet Take 1 tablet by mouth 1 day or 1 dose.      . fexofenadine (ALLEGRA) 180 MG tablet Take 180 mg by mouth daily.     Marland Kitchen FIBER ADULT GUMMIES 2 g CHEW Chew 2 tablets by mouth daily.    . Glucosamine HCl 1500 MG TABS Take 2 tablets by mouth daily.    Marland Kitchen glucose blood test strip 1 each by Other route as needed for other. Use as instructed    . hydrochlorothiazide (HYDRODIURIL) 25 MG tablet     . ibuprofen (ADVIL,MOTRIN) 200 MG tablet Take 200 mg by mouth every 6 (six) hours as needed.      Kathleen [name] 300 MG CAPS Take by mouth daily.      Marland Kitchen omeprazole (PRILOSEC) 20 MG capsule Take 20 mg by mouth daily.      Marland Kitchen omeprazole-sodium bicarbonate (ZEGERID) 40-1100 MG per capsule Take 1 capsule by mouth daily before breakfast.      . Probiotic Product (PROBIOTIC DAILY PO) Take by mouth.    . simvastatin (ZOCOR) 40 MG tablet Take 40 mg by mouth at bedtime.       No current facility-administered medications on file prior to visit.     PAST Kathleen HISTORY: Past Kathleen History:  Diagnosis Date  . Arthritis   . Back pain   . Breast cancer ([place]) 2012  . Cancer ([place])    colon  . Diabetes mellitus   . Family history of breast cancer   . Family history of colon cancer   . GERD (gastroesophageal reflux disease)   . Hyperlipidemia   . Hypertension   . Joint pain   . Kidney problem   . Lactose intolerance   . Neuromuscular disorder ([place])    back, hip, leg left side  . Obesity   . Personal history of radiation therapy 2012  . Sleep apnea   . Spinal stenosis     PAST SURGICAL HISTORY: Past Surgical History:  Procedure Laterality Date  . ABDOMINAL HYSTERECTOMY    . BREAST EXCISIONAL BIOPSY Left 1972  . BREAST LUMPECTOMY Right 2012  . BREAST SURGERY     biopsy  . COLON SURGERY      partial colectomy  . X-STOP IMPLANTATION  September 26th,2012    SOCIAL HISTORY: Social History   Tobacco Use  . Smoking status: Former Research scientist (life sciences)  . Smokeless tobacco: Never Used  Substance Use Topics  . Alcohol use: Yes  . Drug use: No    FAMILY HISTORY: Family History  Problem Relation Age of Onset  . Breast cancer Mother        dx late 73's  . Hyperlipidemia Mother   . Heart disease Mother   . Liver disease Mother   . Breast  cancer Sister 42  . Heart disease Father   . Obesity Father   . Hypertension Father   . Diabetes Father   . Heart disease Maternal Grandmother   . Colon cancer Cousin        dx 1+  . Kidney cancer Son 108    ROS: Review of Systems  Constitutional: Positive for malaise/fatigue.  Respiratory: Positive for shortness of breath (on exertion).   Cardiovascular: Negative for chest pain and orthopnea.       Negative for chest [name]  Gastrointestinal: Negative for nausea and vomiting.  Musculoskeletal:       Negative for muscle weakness  Neurological: Negative for headaches.    PHYSICAL EXAM: Blood [name] (!) 161/78, pulse (!) 58, temperature 98.1 F (36.7 C), temperature source Oral, height 5\' 4"  (1.626 m), weight 259 lb (117.5 kg), SpO2 98 %. Body mass index is 44.46 kg/m. Physical Exam  Constitutional: She is oriented to person, place, and time. She appears well-developed and well-nourished.  HENT:  Head: Normocephalic and atraumatic.  Nose: Nose normal.  Eyes: EOM are normal. No scleral icterus.  Neck: Normal range of motion. Neck supple. No thyromegaly present.  Cardiovascular: Normal rate and regular rhythm.  Pulmonary/Chest: Effort normal. No respiratory distress.  Abdominal: Soft. There is no tenderness.  + Obesity  Musculoskeletal: Normal range of motion.  Range of Motion normal in all 4 extremities  Neurological: She is alert and oriented to person, place, and time. Coordination normal.  Skin: Skin is warm and dry.    Psychiatric: She has a normal mood and affect.  Vitals reviewed.   RECENT LABS AND TESTS: BMET    Component Value Date/Time   NA 138 04/12/2018 1051   NA 141 12/14/2014 0747   K 4.0 04/12/2018 1051   K 4.2 12/14/2014 0747   CL 97 04/12/2018 1051   CO2 25 04/12/2018 1051   CO2 28 12/14/2014 0747   GLUCOSE 138 (H) 04/12/2018 1051   GLUCOSE 120 12/14/2014 0747   BUN 18 04/12/2018 1051   BUN 21.3 12/14/2014 0747   CREATININE 0.79 04/12/2018 1051   CREATININE 0.9 12/14/2014 0747   CALCIUM 9.5 04/12/2018 1051   CALCIUM 9.4 12/14/2014 0747   GFRNONAA 73 04/12/2018 1051   GFRAA 84 04/12/2018 1051   Lab Results  Component Value Date   HGBA1C 6.8 (H) 04/12/2018   Lab Results  Component Value Date   INSULIN 30.6 (H) 04/12/2018   CBC    Component Value Date/Time   WBC 7.1 04/12/2018 1051   WBC 8.0 12/14/2014 0746   WBC 6.1 03/04/2011 1040   RBC 4.27 04/12/2018 1051   RBC 4.17 12/14/2014 0746   RBC 3.99 03/04/2011 1040   HGB 12.8 04/12/2018 1051   HGB 12.4 12/14/2014 0746   HCT 37.8 04/12/2018 1051   HCT 37.0 12/14/2014 0746   PLT 230 12/14/2014 0746   MCV 89 04/12/2018 1051   MCV 88.9 12/14/2014 0746   MCH 30.0 04/12/2018 1051   MCH 29.8 12/14/2014 0746   MCH 30.1 03/04/2011 1040   MCHC 33.9 04/12/2018 1051   MCHC 33.5 12/14/2014 0746   MCHC 33.1 03/04/2011 1040   RDW 12.4 04/12/2018 1051   RDW 12.5 12/14/2014 0746   LYMPHSABS 2.0 04/12/2018 1051   LYMPHSABS 1.8 12/14/2014 0746   MONOABS 0.5 12/14/2014 0746   EOSABS 0.2 04/12/2018 1051   BASOSABS 0.0 04/12/2018 1051   BASOSABS 0.1 12/14/2014 0746   Iron/TIBC/Ferritin/ %Sat No results found for: IRON, TIBC,  FERRITIN, IRONPCTSAT Lipid Panel     Component Value Date/Time   CHOL 155 04/12/2018 1051   TRIG 123 04/12/2018 1051   HDL 60 04/12/2018 1051   LDLCALC 70 04/12/2018 1051   Hepatic Function Panel     Component Value Date/Time   PROT 7.2 04/12/2018 1051   PROT 7.1 12/14/2014 0747   ALBUMIN 4.4  04/12/2018 1051   ALBUMIN 3.8 12/14/2014 0747   AST 17 04/12/2018 1051   AST 16 12/14/2014 0747   ALT 20 04/12/2018 1051   ALT 15 12/14/2014 0747   ALKPHOS 76 04/12/2018 1051   ALKPHOS 52 12/14/2014 0747   BILITOT 0.4 04/12/2018 1051   BILITOT 0.40 12/14/2014 0747      Component Value Date/Time   TSH 1.480 04/12/2018 1051   Vitamin D There are no recent lab results  ECG  shows NSR with a rate of 60 BPM INDIRECT CALORIMETER done today shows a VO2 of 268 and a REE of 1866. Her calculated basal metabolic rate is 7616 thus her basal metabolic rate is better than expected.    ASSESSMENT AND PLAN: Other fatigue - Plan: EKG 12-Lead, CBC With Differential, T3, T4, free, TSH  Shortness of breath on exertion - Plan: Lipid Panel With LDL/HDL Ratio  Type 2 diabetes mellitus without complication, without long-term current use of insulin (HCC) - Plan: Comprehensive metabolic panel, Hemoglobin A1c, Insulin, random  Vitamin D deficiency - Plan: VITAMIN D 25 Hydroxy (Vit-D Deficiency, Fractures)  Essential hypertension  Depression screening  Class 3 severe obesity with serious comorbidity and body mass index (BMI) of 40.0 to 44.9 in adult, unspecified obesity type Kathleen [name])  PLAN:  Fatigue Kathleen [name] was informed that her fatigue may be related to obesity, depression or many other causes. Labs will be ordered, and in the meanwhile Kathleen [name] has agreed to work on diet, exercise and weight loss to help with fatigue. Proper sleep hygiene was discussed including the need for 7-8 hours of quality sleep each night. A sleep study was not ordered based on symptoms and Epworth score. We will order indirect calorimetry and EKG today.  Dyspnea on exertion Kathleen [name] shortness of breath appears to be obesity related and exercise induced. She has agreed to work on weight loss and gradually increase exercise to treat her exercise induced shortness of breath. If Kathleen [name] follows our instructions and loses  weight without improvement of her shortness of breath, we will plan to refer to pulmonology. We will order indirect calorimetry, EKG and labs today. We will monitor this condition regularly. Kathleen [name] agrees to this plan.  Diabetes II Kathleen [name] has been given extensive diabetes education by myself today including ideal fasting and post-prandial blood glucose readings, individual ideal Hgb A1c goals and hypoglycemia prevention. We discussed the importance of good blood sugar control to decrease the likelihood of diabetic complications such as nephropathy, neuropathy, limb loss, blindness, coronary artery disease, and death. We discussed the importance of intensive lifestyle modification including diet, exercise and weight loss as the first line treatment for diabetes. We will check Hgb A1c, insulin level, urine MAB and FLP today. Kathleen [name] agrees to follow up at the agreed upon time.  Vitamin D Deficiency Kathleen [name] was informed that low vitamin D levels contributes to fatigue and are associated with obesity, breast, and colon cancer. She  Will continue to take OTC Vit D supplement and will follow up for routine testing of vitamin D, at least 2-3 times per year. She was informed of the risk of over-replacement of vitamin  D and agrees to not increase her dose unless she discusses this with [name]. We will check vitamin D level today.  Hypertension We discussed sodium restriction, working on healthy weight loss, and a regular exercise program as the means to achieve improved blood [name] control. Kathleen [name] agreed with this plan and agreed to follow up as directed. We will continue to monitor her blood [name] as well as her progress with the above lifestyle modifications. She will continue her medications as prescribed and will watch for signs of hypotension as she continues her lifestyle modifications. We will; check CMP and fasting lipid panel today.  Depression Kathleen [name] had a mildly positive  depression screening. Depression is commonly associated with obesity and often results in emotional eating behaviors. We will monitor this closely and work on CBT to help improve the non-hunger eating patterns. Referral to Psychology may be required if no improvement is seen as she continues in our clinic.  Obesity Kathleen [name] is currently in the action stage of change and her goal is to continue with weight loss efforts She has agreed to follow the Category 3 Kathleen [name] has been instructed to work up to a goal of 150 minutes of combined cardio and strengthening exercise per week for weight loss and overall health benefits. We discussed the following Behavioral Modification Strategies today: planning for success, increasing lean protein intake, increasing vegetables and work on meal planning and easy cooking plans  Kathleen [name] has agreed to follow up with our clinic in 2 weeks. She was informed of the importance of frequent follow up visits to maximize her success with intensive lifestyle modifications for her multiple health conditions. She was informed we would discuss her lab results at her next visit unless there is a critical issue that needs to be addressed sooner. Kathleen [name] agreed to keep her next visit at the agreed upon time to discuss these results.    OBESITY BEHAVIORAL INTERVENTION VISIT  Today's visit was # 1   Starting weight: 259 lbs Starting date: 04/12/18 Today's weight : 259 lbs  Today's date: 04/12/2018 Total lbs lost to date: 0 At least 15 minutes were spent on discussing the following behavioral intervention visit.   ASK: We discussed the diagnosis of obesity with Kathleen [name] today and Kathleen [name] agreed to give [name] permission to discuss obesity behavioral modification therapy today.  ASSESS: Kathleen [name] has the diagnosis of obesity and her BMI today is 29.44 Kathleen [name] is in the action stage of change   ADVISE: Kathleen [name] was educated on the multiple health risks of  obesity as well as the benefit of weight loss to improve her health. She was advised of the need for long term treatment and the importance of lifestyle modifications to improve her current health and to decrease her risk of future health problems.  AGREE: Multiple dietary modification options and treatment options were discussed and  Kathleen [name] agreed to follow the recommendations documented in the above note.  ARRANGE: Kathleen [name] was educated on the importance of frequent visits to treat obesity as outlined per CMS and USPSTF guidelines and agreed to schedule her next follow up appointment today.   I, Doreene Nest, am acting as transcriptionist for Eber Jones, MD   I have reviewed the above documentation for accuracy and completeness, and I agree with the above. - Ilene Qua, MD

## 2018-04-27 ENCOUNTER — Ambulatory Visit (INDEPENDENT_AMBULATORY_CARE_PROVIDER_SITE_OTHER): Payer: Medicare Other | Admitting: Family Medicine

## 2018-04-27 VITALS — BP 156/83 | HR 57 | Temp 98.0°F | Ht 64.0 in | Wt 257.0 lb

## 2018-04-27 DIAGNOSIS — Z6841 Body Mass Index (BMI) 40.0 and over, adult: Secondary | ICD-10-CM

## 2018-04-27 DIAGNOSIS — I10 Essential (primary) hypertension: Secondary | ICD-10-CM

## 2018-04-27 DIAGNOSIS — E119 Type 2 diabetes mellitus without complications: Secondary | ICD-10-CM | POA: Diagnosis not present

## 2018-04-27 DIAGNOSIS — E559 Vitamin D deficiency, unspecified: Secondary | ICD-10-CM

## 2018-04-27 MED ORDER — VITAMIN D (ERGOCALCIFEROL) 1.25 MG (50000 UNIT) PO CAPS: 50000.0000 [IU] | ORAL_CAPSULE | ORAL | 0 refills | Status: DC

## 2018-04-27 MED ORDER — METFORMIN HCL 500 MG PO TABS: 500.0000 mg | ORAL_TABLET | Freq: Every day | ORAL | 0 refills | Status: DC

## 2018-04-27 MED ORDER — LISINOPRIL 10 MG PO TABS: 10.0000 mg | ORAL_TABLET | Freq: Every day | ORAL | 0 refills | Status: DC

## 2018-04-28 NOTE — Progress Notes (Signed)
Office: [phone]  /  Fax: [phone]   HPI:   Chief Complaint: [place] is here to discuss her progress with her obesity treatment plan. She is on the Category 3 plan and is following her eating plan approximately 80 % of the time. She states she is walking 10 minutes 3 times per week. [name] doesn't think she ate eight ounces of meat at dinner. She didn't eat all of her snacks and if she did, she only ate one snack; never all three. [name] got in all of her food at breakfast and she may have missed bread a few days. She is not usually hungry. Her weight is 257 lb (116.6 kg) today and has had a weight loss of 2 pounds over a period of 2 weeks since her last visit. She has lost 2 lbs since starting treatment with [name].  Vitamin D deficiency [name] has a diagnosis of vitamin D deficiency. She is currently taking OTC vit D. [place] admits fatigue and denies nausea, vomiting or muscle weakness.  Diabetes II [name] has a diagnosis of diabetes type II. [name] is not on any medications and she denies any hypoglycemic episodes. Last A1c was at 6.8 (increase from recent 6.4) She has been working on intensive lifestyle modifications including diet, exercise, and weight loss to help control her blood glucose levels.  Hypertension [name] is a 77 y.o. female with hypertension. Her blood pressure is elevated today at 156/83 and [place] denies chest pain, chest pressure or headache. [name] is on HCTZ and Bisoprolol. She is working weight loss to help control her blood pressure with the goal of decreasing her risk of heart attack and stroke. [place] blood pressure is not currently controlled.  ALLERGIES: Allergies  Allergen Reactions  . Mobic [Meloxicam] Shortness Of Breath    "Wheezing"   . Naproxen Sodium Swelling    Hands and feet  . Tape     Surgical tape adhesive and paper tape   . Doxycycline Itching and Rash    MEDICATIONS: Current Outpatient  Medications on File Prior to Visit  Medication Sig Dispense Refill  . acetaminophen (TYLENOL) 500 MG tablet Take 500 mg by mouth every 6 (six) hours as needed. 1-2 tablets q 4-6 hrs            . aspirin 81 MG tablet Take 81 mg by mouth daily.    . betamethasone dipropionate (DIPROLENE) 0.05 % cream Apply topically 1 day or 1 dose.      . bisoprolol (ZEBETA) 5 MG tablet     . fexofenadine (ALLEGRA) 180 MG tablet Take 180 mg by mouth daily.     Marland Kitchen FIBER ADULT GUMMIES 2 g CHEW Chew 2 tablets by mouth daily.    . Glucosamine HCl 1500 MG TABS Take 2 tablets by mouth daily.    Marland Kitchen glucose blood test strip 1 each by Other route as needed for other. Use as instructed    . hydrochlorothiazide (HYDRODIURIL) 25 MG tablet     . ibuprofen (ADVIL,MOTRIN) 200 MG tablet Take 200 mg by mouth every 6 (six) hours as needed.      Javier Docker Oil 300 MG CAPS Take by mouth daily.      Marland Kitchen omeprazole (PRILOSEC) 20 MG capsule Take 20 mg by mouth daily.      Marland Kitchen omeprazole-sodium bicarbonate (ZEGERID) 40-1100 MG per capsule Take 1 capsule by mouth daily before breakfast.      . Probiotic Product (PROBIOTIC DAILY PO) Take  by mouth.    . simvastatin (ZOCOR) 40 MG tablet Take 40 mg by mouth at bedtime.       No current facility-administered medications on file prior to visit.     PAST MEDICAL HISTORY: Past Medical History:  Diagnosis Date  . Arthritis   . Back pain   . Breast cancer ([place]) 2012  . Cancer ([place])    colon  . Diabetes mellitus   . Family history of breast cancer   . Family history of colon cancer   . GERD (gastroesophageal reflux disease)   . Hyperlipidemia   . Hypertension   . Joint pain   . Kidney problem   . Lactose intolerance   . Neuromuscular disorder ([place])    back, hip, leg left side  . Obesity   . Personal history of radiation therapy 2012  . Sleep apnea   . Spinal stenosis     PAST SURGICAL HISTORY: Past Surgical History:  Procedure Laterality Date  . ABDOMINAL HYSTERECTOMY      . BREAST EXCISIONAL BIOPSY Left 1972  . BREAST LUMPECTOMY Right 2012  . BREAST SURGERY     biopsy  . COLON SURGERY     partial colectomy  . X-STOP IMPLANTATION  September 26th,2012    SOCIAL HISTORY: Social History   Tobacco Use  . Smoking status: Former Research scientist (life sciences)  . Smokeless tobacco: Never Used  Substance Use Topics  . Alcohol use: Yes  . Drug use: No    FAMILY HISTORY: Family History  Problem Relation Age of Onset  . Breast cancer Mother        dx late 36's  . Hyperlipidemia Mother   . Heart disease Mother   . Liver disease Mother   . Breast cancer Sister 80  . Heart disease Father   . Obesity Father   . Hypertension Father   . Diabetes Father   . Heart disease Maternal Grandmother   . Colon cancer Cousin        dx 47+  . Kidney cancer Son 55    ROS: Review of Systems  Constitutional: Positive for malaise/fatigue and weight loss.  Cardiovascular: Negative for chest pain.       Negative for chest pressure  Gastrointestinal: Negative for nausea and vomiting.  Musculoskeletal:       Negative for muscle weakness  Neurological: Negative for headaches.  Endo/Heme/Allergies:       Negative for hypoglycemia    PHYSICAL EXAM: Blood pressure (!) 156/83, pulse (!) 57, temperature 98 F (36.7 C), temperature source Oral, height 5\' 4"  (1.626 m), weight 257 lb (116.6 kg), SpO2 97 %. Body mass index is 44.11 kg/m. Physical Exam  Constitutional: She is oriented to person, place, and time. She appears well-developed and well-nourished.  Cardiovascular: Normal rate.  Pulmonary/Chest: Effort normal.  Musculoskeletal: Normal range of motion.  Neurological: She is oriented to person, place, and time.  Skin: Skin is warm and dry.  Psychiatric: She has a normal mood and affect. Her behavior is normal.  Vitals reviewed.   RECENT LABS AND TESTS: BMET    Component Value Date/Time   NA 138 04/12/2018 1051   NA 141 12/14/2014 0747   K 4.0 04/12/2018 1051   K 4.2  12/14/2014 0747   CL 97 04/12/2018 1051   CO2 25 04/12/2018 1051   CO2 28 12/14/2014 0747   GLUCOSE 138 (H) 04/12/2018 1051   GLUCOSE 120 12/14/2014 0747   BUN 18 04/12/2018 1051   BUN 21.3 12/14/2014  0747   CREATININE 0.79 04/12/2018 1051   CREATININE 0.9 12/14/2014 0747   CALCIUM 9.5 04/12/2018 1051   CALCIUM 9.4 12/14/2014 0747   GFRNONAA 73 04/12/2018 1051   GFRAA 84 04/12/2018 1051   Lab Results  Component Value Date   HGBA1C 6.8 (H) 04/12/2018   Lab Results  Component Value Date   INSULIN 30.6 (H) 04/12/2018   CBC    Component Value Date/Time   WBC 7.1 04/12/2018 1051   WBC 8.0 12/14/2014 0746   WBC 6.1 03/04/2011 1040   RBC 4.27 04/12/2018 1051   RBC 4.17 12/14/2014 0746   RBC 3.99 03/04/2011 1040   HGB 12.8 04/12/2018 1051   HGB 12.4 12/14/2014 0746   HCT 37.8 04/12/2018 1051   HCT 37.0 12/14/2014 0746   PLT 230 12/14/2014 0746   MCV 89 04/12/2018 1051   MCV 88.9 12/14/2014 0746   MCH 30.0 04/12/2018 1051   MCH 29.8 12/14/2014 0746   MCH 30.1 03/04/2011 1040   MCHC 33.9 04/12/2018 1051   MCHC 33.5 12/14/2014 0746   MCHC 33.1 03/04/2011 1040   RDW 12.4 04/12/2018 1051   RDW 12.5 12/14/2014 0746   LYMPHSABS 2.0 04/12/2018 1051   LYMPHSABS 1.8 12/14/2014 0746   MONOABS 0.5 12/14/2014 0746   EOSABS 0.2 04/12/2018 1051   BASOSABS 0.0 04/12/2018 1051   BASOSABS 0.1 12/14/2014 0746   Iron/TIBC/Ferritin/ %Sat No results found for: IRON, TIBC, FERRITIN, IRONPCTSAT Lipid Panel     Component Value Date/Time   CHOL 155 04/12/2018 1051   TRIG 123 04/12/2018 1051   HDL 60 04/12/2018 1051   LDLCALC 70 04/12/2018 1051   Hepatic Function Panel     Component Value Date/Time   PROT 7.2 04/12/2018 1051   PROT 7.1 12/14/2014 0747   ALBUMIN 4.4 04/12/2018 1051   ALBUMIN 3.8 12/14/2014 0747   AST 17 04/12/2018 1051   AST 16 12/14/2014 0747   ALT 20 04/12/2018 1051   ALT 15 12/14/2014 0747   ALKPHOS 76 04/12/2018 1051   ALKPHOS 52 12/14/2014 0747   BILITOT  0.4 04/12/2018 1051   BILITOT 0.40 12/14/2014 0747      Component Value Date/Time   TSH 1.480 04/12/2018 1051   Results for SHEELAH, RITACCO (MRN [id number]) as of 04/28/2018 12:06  Ref. Range 04/12/2018 10:51  Vitamin D, 25-Hydroxy Latest Ref Range: 30.0 - 100.0 ng/mL 25.2 (L)   ASSESSMENT AND PLAN: Vitamin D deficiency - Plan: Vitamin D, Ergocalciferol, (DRISDOL) 1.25 MG (50000 UT) CAPS capsule  Type 2 diabetes mellitus without complication, without long-term current use of insulin (HCC) - Plan: metFORMIN (GLUCOPHAGE) 500 MG tablet  Essential hypertension - Plan: lisinopril (PRINIVIL,ZESTRIL) 10 MG tablet  Class 3 severe obesity with serious comorbidity and body mass index (BMI) of 40.0 to 44.9 in adult, unspecified obesity type ([place])  PLAN:  Vitamin D Deficiency [name] was informed that low vitamin D levels contributes to fatigue and are associated with obesity, breast, and colon cancer. She agrees to stop OTC Vit D and start prescription Vit D @50 ,000 IU every week #4 with no refills and will follow up for routine testing of vitamin D, at least 2-3 times per year. She was informed of the risk of over-replacement of vitamin D and agrees to not increase her dose unless she discusses this with [name]. We will recheck vitamin D level in 3 months and [name] agrees to follow up as directed.  Diabetes II [name] has been given extensive diabetes education by  myself today including ideal fasting and post-prandial blood glucose readings, individual ideal Hgb A1c goals and hypoglycemia prevention. We discussed the importance of good blood sugar control to decrease the likelihood of diabetic complications such as nephropathy, neuropathy, limb loss, blindness, coronary artery disease, and death. We discussed the importance of intensive lifestyle modification including diet, exercise and weight loss as the first line treatment for diabetes. [name] agrees to take Lisinopril 10 mg PO daily  #30 with no refills (kidney protection) and continue Metformin 500 mg PO qAM #30 with nor efills and follow up at the agreed upon time.  Hypertension We discussed sodium restriction, working on healthy weight loss, and a regular exercise program as the means to achieve improved blood pressure control. [name] agreed with this plan and agreed to follow up as directed. We will continue to monitor her blood pressure as well as her progress with the above lifestyle modifications. She agrees to start Lisinopril for kidney protection and will watch for signs of hypotension as she continues her lifestyle modifications.  Obesity [name] is currently in the action stage of change. As such, her goal is to continue with weight loss efforts She has agreed to follow the Category 3 [place] has been instructed to work up to a goal of 150 minutes of combined cardio and strengthening exercise per week for weight loss and overall health benefits. We discussed the following Behavioral Modification Strategies today: planning for success, increasing lean protein intake, increasing vegetables and work on meal planning and easy cooking plans  [name] has agreed to follow up with our clinic in 2 weeks. She was informed of the importance of frequent follow up visits to maximize her success with intensive lifestyle modifications for her multiple health conditions.   OBESITY BEHAVIORAL INTERVENTION VISIT  Today's visit was # 2   Starting weight: 259 lbs Starting date: 04/12/2018 Today's weight : 257 lbs Today's date: 04/27/2018 Total lbs lost to date: 2 At least 15 minutes were spent on discussing the following behavioral intervention visit.   ASK: We discussed the diagnosis of obesity with [name] today and [name] agreed to give [name] permission to discuss obesity behavioral modification therapy today.  ASSESS: [name] has the diagnosis of obesity and her BMI today is 4.09 [name] is in the  action stage of change   ADVISE: [name] was educated on the multiple health risks of obesity as well as the benefit of weight loss to improve her health. She was advised of the need for long term treatment and the importance of lifestyle modifications to improve her current health and to decrease her risk of future health problems.  AGREE: Multiple dietary modification options and treatment options were discussed and  [name] agreed to follow the recommendations documented in the above note.  ARRANGE: [name] was educated on the importance of frequent visits to treat obesity as outlined per CMS and USPSTF guidelines and agreed to schedule her next follow up appointment today.  I, Doreene Nest, am acting as transcriptionist for Eber Jones, MD  I have reviewed the above documentation for accuracy and completeness, and I agree with the above. - Ilene Qua, MD

## 2018-05-18 ENCOUNTER — Ambulatory Visit (INDEPENDENT_AMBULATORY_CARE_PROVIDER_SITE_OTHER): Payer: Medicare Other | Admitting: Family Medicine

## 2018-05-18 VITALS — BP 150/89 | HR 61 | Temp 97.8°F | Ht 64.0 in | Wt 256.0 lb

## 2018-05-18 DIAGNOSIS — E559 Vitamin D deficiency, unspecified: Secondary | ICD-10-CM | POA: Diagnosis not present

## 2018-05-18 DIAGNOSIS — I10 Essential (primary) hypertension: Secondary | ICD-10-CM

## 2018-05-18 DIAGNOSIS — E1165 Type 2 diabetes mellitus with hyperglycemia: Secondary | ICD-10-CM | POA: Diagnosis not present

## 2018-05-18 DIAGNOSIS — Z6841 Body Mass Index (BMI) 40.0 and over, adult: Secondary | ICD-10-CM

## 2018-05-18 DIAGNOSIS — Z9189 Other specified personal risk factors, not elsewhere classified: Secondary | ICD-10-CM

## 2018-05-18 MED ORDER — METFORMIN HCL 500 MG PO TABS: 500.0000 mg | ORAL_TABLET | Freq: Every day | ORAL | 0 refills | Status: DC

## 2018-05-18 MED ORDER — VITAMIN D (ERGOCALCIFEROL) 1.25 MG (50000 UNIT) PO CAPS: 50000.0000 [IU] | ORAL_CAPSULE | ORAL | 0 refills | Status: DC

## 2018-05-18 MED ORDER — LOSARTAN POTASSIUM 25 MG PO TABS: 25.0000 mg | ORAL_TABLET | Freq: Every day | ORAL | 0 refills | Status: DC

## 2018-05-19 ENCOUNTER — Encounter (INDEPENDENT_AMBULATORY_CARE_PROVIDER_SITE_OTHER): Payer: Self-pay | Admitting: Family Medicine

## 2018-05-21 ENCOUNTER — Other Ambulatory Visit (INDEPENDENT_AMBULATORY_CARE_PROVIDER_SITE_OTHER): Payer: Self-pay | Admitting: Family Medicine

## 2018-05-21 DIAGNOSIS — I10 Essential (primary) hypertension: Secondary | ICD-10-CM

## 2018-05-22 ENCOUNTER — Encounter (INDEPENDENT_AMBULATORY_CARE_PROVIDER_SITE_OTHER): Payer: Self-pay | Admitting: Family Medicine

## 2018-05-24 NOTE — Progress Notes (Signed)
Office: [phone]  /  Fax: [phone]   HPI:   Chief Complaint: [place] is here to discuss her progress with her obesity treatment plan. She is on the Category 3 plan and is following her eating plan approximately 75 % of the time. She states she is exercising 0 minutes 0 times per week. Kathleen [name] is still working toward 8 oz of meat at dinner (got 6 oz in consistently), and she feels stuffed with 6 oz. She has quite a few events coming up for the holiday. She plans to eat ahead of events.  Her weight is 256 lb (116.1 kg) today and has had a weight loss of 1 pound over a period of 3 weeks since her last visit. She has lost 3 lbs since starting treatment with [name].  Diabetes II with Hyperglycemia Kathleen [name] has a diagnosis of diabetes type II. Kathleen [name] denies feelings of hypoglycemia. She denies GI side effects of metformin, ACE, or statin (need to switch to ARB secondary to cough). Last A1c was 6.8. She has been working on intensive lifestyle modifications including diet, exercise, and weight loss to help control her blood glucose levels.  Vitamin D Deficiency Kathleen [name] has a diagnosis of vitamin D deficiency. She is currently taking prescription Vit D. She notes fatigue and denies nausea, vomiting or muscle weakness.  Hypertension [name] is a 77 y.o. female with hypertension. Kathleen [name]'s blood pressure is elevated on manuel reading (150/88) but blood pressure on her wrist machine was 129/64. She denies chest pain, chest pressure, or headaches. She is working weight loss to help control her blood pressure with the goal of decreasing her risk of heart attack and stroke. Kathleen [name]'s blood pressure is not currently controlled.  ALLERGIES: Allergies  Allergen Reactions  . Mobic [Meloxicam] Shortness Of Breath    "Wheezing"   . Naproxen Sodium Swelling    Hands and feet  . Tape     Surgical tape adhesive and paper tape   . Doxycycline Itching and Rash     MEDICATIONS: Current Outpatient Medications on File Prior to Visit  Medication Sig Dispense Refill  . acetaminophen (TYLENOL) 500 MG tablet Take 500 mg by mouth every 6 (six) hours as needed. 1-2 tablets q 4-6 hrs            . aspirin 81 MG tablet Take 81 mg by mouth daily.    . betamethasone dipropionate (DIPROLENE) 0.05 % cream Apply topically 1 day or 1 dose.      . bisoprolol (ZEBETA) 5 MG tablet     . fexofenadine (ALLEGRA) 180 MG tablet Take 180 mg by mouth daily.     Marland Kitchen FIBER ADULT GUMMIES 2 g CHEW Chew 2 tablets by mouth daily.    . Glucosamine HCl 1500 MG TABS Take 2 tablets by mouth daily.    Marland Kitchen glucose blood test strip 1 each by Other route as needed for other. Use as instructed    . hydrochlorothiazide (HYDRODIURIL) 25 MG tablet     . ibuprofen (ADVIL,MOTRIN) 200 MG tablet Take 200 mg by mouth every 6 (six) hours as needed.      Kathleen [name] Oil 300 MG CAPS Take by mouth daily.      Marland Kitchen omeprazole (PRILOSEC) 20 MG capsule Take 20 mg by mouth daily.      Marland Kitchen omeprazole-sodium bicarbonate (ZEGERID) 40-1100 MG per capsule Take 1 capsule by mouth daily before breakfast.      . Probiotic Product (PROBIOTIC DAILY PO) Take by mouth.    Marland Kitchen  simvastatin (ZOCOR) 40 MG tablet Take 40 mg by mouth at bedtime.       No current facility-administered medications on file prior to visit.     PAST MEDICAL HISTORY: Past Medical History:  Diagnosis Date  . Arthritis   . Back pain   . Breast cancer (Kathleen [name]) 2012  . Cancer (Kathleen [name])    colon  . Diabetes mellitus   . Family history of breast cancer   . Family history of colon cancer   . GERD (gastroesophageal reflux disease)   . Hyperlipidemia   . Hypertension   . Joint pain   . Kidney problem   . Lactose intolerance   . Neuromuscular disorder (Kathleen [name])    back, hip, leg left side  . Obesity   . Personal history of radiation therapy 2012  . Sleep apnea   . Spinal stenosis     PAST SURGICAL HISTORY: Past Surgical History:  Procedure  Laterality Date  . ABDOMINAL HYSTERECTOMY    . BREAST EXCISIONAL BIOPSY Left 1972  . BREAST LUMPECTOMY Right 2012  . BREAST SURGERY     biopsy  . COLON SURGERY     partial colectomy  . X-STOP IMPLANTATION  September 26th,2012    SOCIAL HISTORY: Social History   Tobacco Use  . Smoking status: Former Research scientist (life sciences)  . Smokeless tobacco: Never Used  Substance Use Topics  . Alcohol use: Yes  . Drug use: No    FAMILY HISTORY: Family History  Problem Relation Age of Onset  . Breast cancer Mother        dx late 65's  . Hyperlipidemia Mother   . Heart disease Mother   . Liver disease Mother   . Breast cancer Sister 62  . Heart disease Father   . Obesity Father   . Hypertension Father   . Diabetes Father   . Heart disease Maternal Grandmother   . Colon cancer Cousin        dx 83+  . Kidney cancer Son 41    ROS: Review of Systems  Constitutional: Positive for malaise/fatigue and weight loss.  Cardiovascular: Negative for chest pain.       Negative chest pressure  Gastrointestinal: Negative for nausea and vomiting.  Musculoskeletal:       Negative muscle weakness  Neurological: Negative for headaches.  Endo/Heme/Allergies:       Negative hypoglycemia    PHYSICAL EXAM: Blood pressure (!) 150/89, pulse 61, temperature 97.8 F (36.6 C), temperature source Oral, height 5\' 4"  (1.626 m), weight 256 lb (116.1 kg), SpO2 97 %. Body mass index is 43.94 kg/m. Physical Exam  Constitutional: She is oriented to person, place, and time. She appears well-developed and well-nourished.  Cardiovascular: Normal rate.  Pulmonary/Chest: Effort normal.  Musculoskeletal: Normal range of motion.  Neurological: She is oriented to person, place, and time.  Skin: Skin is warm and dry.  Psychiatric: She has a normal mood and affect. Her behavior is normal.  Vitals reviewed.   RECENT LABS AND TESTS: BMET    Component Value Date/Time   NA 138 04/12/2018 1051   NA 141 12/14/2014 0747   K 4.0  04/12/2018 1051   K 4.2 12/14/2014 0747   CL 97 04/12/2018 1051   CO2 25 04/12/2018 1051   CO2 28 12/14/2014 0747   GLUCOSE 138 (H) 04/12/2018 1051   GLUCOSE 120 12/14/2014 0747   BUN 18 04/12/2018 1051   BUN 21.3 12/14/2014 0747   CREATININE 0.79 04/12/2018 1051   CREATININE 0.9  12/14/2014 0747   CALCIUM 9.5 04/12/2018 1051   CALCIUM 9.4 12/14/2014 0747   GFRNONAA 73 04/12/2018 1051   GFRAA 84 04/12/2018 1051   Lab Results  Component Value Date   HGBA1C 6.8 (H) 04/12/2018   Lab Results  Component Value Date   INSULIN 30.6 (H) 04/12/2018   CBC    Component Value Date/Time   WBC 7.1 04/12/2018 1051   WBC 8.0 12/14/2014 0746   WBC 6.1 03/04/2011 1040   RBC 4.27 04/12/2018 1051   RBC 4.17 12/14/2014 0746   RBC 3.99 03/04/2011 1040   HGB 12.8 04/12/2018 1051   HGB 12.4 12/14/2014 0746   HCT 37.8 04/12/2018 1051   HCT 37.0 12/14/2014 0746   PLT 230 12/14/2014 0746   MCV 89 04/12/2018 1051   MCV 88.9 12/14/2014 0746   MCH 30.0 04/12/2018 1051   MCH 29.8 12/14/2014 0746   MCH 30.1 03/04/2011 1040   MCHC 33.9 04/12/2018 1051   MCHC 33.5 12/14/2014 0746   MCHC 33.1 03/04/2011 1040   RDW 12.4 04/12/2018 1051   RDW 12.5 12/14/2014 0746   LYMPHSABS 2.0 04/12/2018 1051   LYMPHSABS 1.8 12/14/2014 0746   MONOABS 0.5 12/14/2014 0746   EOSABS 0.2 04/12/2018 1051   BASOSABS 0.0 04/12/2018 1051   BASOSABS 0.1 12/14/2014 0746   Iron/TIBC/Ferritin/ %Sat No results found for: IRON, TIBC, FERRITIN, IRONPCTSAT Lipid Panel     Component Value Date/Time   CHOL 155 04/12/2018 1051   TRIG 123 04/12/2018 1051   HDL 60 04/12/2018 1051   LDLCALC 70 04/12/2018 1051   Hepatic Function Panel     Component Value Date/Time   PROT 7.2 04/12/2018 1051   PROT 7.1 12/14/2014 0747   ALBUMIN 4.4 04/12/2018 1051   ALBUMIN 3.8 12/14/2014 0747   AST 17 04/12/2018 1051   AST 16 12/14/2014 0747   ALT 20 04/12/2018 1051   ALT 15 12/14/2014 0747   ALKPHOS 76 04/12/2018 1051   ALKPHOS 52  12/14/2014 0747   BILITOT 0.4 04/12/2018 1051   BILITOT 0.40 12/14/2014 0747      Component Value Date/Time   TSH 1.480 04/12/2018 1051  Results for KYLINN, SHROPSHIRE (MRN [id number]) as of 05/24/2018 13:26  Ref. Range 04/12/2018 10:51  Vitamin D, 25-Hydroxy Latest Ref Range: 30.0 - 100.0 ng/mL 25.2 (L)    ASSESSMENT AND PLAN: Type 2 diabetes mellitus with hyperglycemia, without long-term current use of insulin (HCC) - Plan: metFORMIN (GLUCOPHAGE) 500 MG tablet, losartan (COZAAR) 25 MG tablet  Vitamin D deficiency - Plan: Vitamin D, Ergocalciferol, (DRISDOL) 1.25 MG (50000 UT) CAPS capsule  Essential hypertension  Class 3 severe obesity with serious comorbidity and body mass index (BMI) of 40.0 to 44.9 in adult, unspecified obesity type ([place])  PLAN:  Diabetes II with Hyperglycemia Kathleen [name] has been given extensive diabetes education by myself today including ideal fasting and post-prandial blood glucose readings, individual ideal Hgb A1c goals and hypoglycemia prevention. We discussed the importance of good blood sugar control to decrease the likelihood of diabetic complications such as nephropathy, neuropathy, limb loss, blindness, coronary artery disease, and death. We discussed the importance of intensive lifestyle modification including diet, exercise and weight loss as the first line treatment for diabetes. Kathleen [name] agrees to continue taking metformin 500 mg PO q AM #30 and we will refill for 1 month. She is to stop lisinopril secondary to cough, and she agrees to start Losartan 25 mg PO daily #30 with no refills. Kathleen [name] agrees to follow up  with our clinic in 2 weeks.  Vitamin D Deficiency Kathleen [name] was informed that low vitamin D levels contributes to fatigue and are associated with obesity, breast, and colon cancer. Kathleen [name] agrees to continue taking prescription Vit D @50 ,000 IU every week #4 and we will refill for 1 month. She will follow up for routine testing of vitamin D, at  least 2-3 times per year. She was informed of the risk of over-replacement of vitamin D and agrees to not increase her dose unless she discusses this with [name]. Kathleen [name] agrees to follow up with our clinic in 2 weeks.  Hypertension We discussed sodium restriction, working on healthy weight loss, and a regular exercise program as the means to achieve improved blood pressure control. Kathleen [name] agreed with this plan and agreed to follow up as directed. We will continue to monitor her blood pressure as well as her progress with the above lifestyle modifications. She will watch for signs of hypotension as she continues her lifestyle modifications. We will follow up on blood pressure at next appointment with change in medications. Kathleen [name] agrees to follow up with our clinic in 2 weeks.  Obesity Kathleen [name] is currently in the action stage of change. As such, her goal is to continue with weight loss efforts She has agreed to follow the Category 3 [place] has been instructed to work up to a goal of 150 minutes of combined cardio and strengthening exercise per week for weight loss and overall health benefits. We discussed the following Behavioral Modification Strategies today: increasing lean protein intake, no skipping meals, and holiday eating strategies    Kathleen [name] has agreed to follow up with our clinic in 2 weeks. She was informed of the importance of frequent follow up visits to maximize her success with intensive lifestyle modifications for her multiple health conditions.   OBESITY BEHAVIORAL INTERVENTION VISIT  Today's visit was # 3   Starting weight: 259 lbs Starting date: 04/12/18 Today's weight : 256 lbs Today's date: 05/18/2018 Total lbs lost to date: 3 At least 15 minutes were spent on discussing the following behavioral intervention visit.   ASK: We discussed the diagnosis of obesity with [name] today and Kathleen [name] agreed to give [name] permission to discuss obesity  behavioral modification therapy today.  ASSESS: Kathleen [name] has the diagnosis of obesity and her BMI today is 20.92 Kathleen [name] is in the action stage of change   ADVISE: Kathleen [name] was educated on the multiple health risks of obesity as well as the benefit of weight loss to improve her health. She was advised of the need for long term treatment and the importance of lifestyle modifications to improve her current health and to decrease her risk of future health problems.  AGREE: Multiple dietary modification options and treatment options were discussed and  Kathleen [name] agreed to follow the recommendations documented in the above note.  ARRANGE: Kathleen [name] was educated on the importance of frequent visits to treat obesity as outlined per CMS and USPSTF guidelines and agreed to schedule her next follow up appointment today.  I, Trixie Dredge, am acting as transcriptionist for Ilene Qua, MD  I have reviewed the above documentation for accuracy and completeness, and I agree with the above. - Ilene Qua, MD

## 2018-06-02 ENCOUNTER — Ambulatory Visit (INDEPENDENT_AMBULATORY_CARE_PROVIDER_SITE_OTHER): Payer: Medicare Other | Admitting: Family Medicine

## 2018-06-02 ENCOUNTER — Encounter (INDEPENDENT_AMBULATORY_CARE_PROVIDER_SITE_OTHER): Payer: Self-pay | Admitting: Family Medicine

## 2018-06-02 VITALS — BP 154/84 | HR 60 | Temp 97.7°F | Ht 64.0 in | Wt 252.0 lb

## 2018-06-02 DIAGNOSIS — Z6841 Body Mass Index (BMI) 40.0 and over, adult: Secondary | ICD-10-CM | POA: Diagnosis not present

## 2018-06-02 DIAGNOSIS — I10 Essential (primary) hypertension: Secondary | ICD-10-CM

## 2018-06-02 DIAGNOSIS — E1165 Type 2 diabetes mellitus with hyperglycemia: Secondary | ICD-10-CM

## 2018-06-02 DIAGNOSIS — E559 Vitamin D deficiency, unspecified: Secondary | ICD-10-CM

## 2018-06-02 MED ORDER — VALSARTAN 80 MG PO TABS: 80.0000 mg | ORAL_TABLET | Freq: Every day | ORAL | 0 refills | Status: DC

## 2018-06-02 MED ORDER — VITAMIN D (ERGOCALCIFEROL) 1.25 MG (50000 UNIT) PO CAPS: 50000.0000 [IU] | ORAL_CAPSULE | ORAL | 0 refills | Status: DC

## 2018-06-02 MED ORDER — METFORMIN HCL 500 MG PO TABS: 500.0000 mg | ORAL_TABLET | Freq: Every day | ORAL | 0 refills | Status: DC

## 2018-06-02 NOTE — Progress Notes (Signed)
Office: [phone]  /  Fax: [phone]   HPI:   Chief Complaint: Minor is here to discuss her progress with her obesity treatment plan. She is on the Category 3 plan and is following her eating plan approximately 85 % of the time. She states she is walking for 5-10 minutes 3-4 times per week. Kathleen [name] is working on getting all protein in for dinner portion by moving it to afternoon. She does voice she also was able to get back on track easily. She has family celebrations planned for Christmas.  Her weight is 252 lb (114.3 kg) today and has had a weight loss of 4 pounds over a period of 2 weeks since her last visit. She has lost 7 lbs since starting treatment with [name].  Vitamin D Deficiency Kathleen [name] has a diagnosis of vitamin D deficiency. She is currently taking prescription Vit D. She notes fatigue and denies nausea, vomiting or muscle weakness.  Diabetes II with Hyperglycemia Kathleen [name] has a diagnosis of diabetes type II. Kathleen [name] is on metformin, ARB, and statin, and she notes occasional carbohydrate cravings. She denies hypoglycemia. Last A1c was 6.8. She has been working on intensive lifestyle modifications including diet, exercise, and weight loss to help control her blood glucose levels.  Hypertension [name] is a 77 y.o. female with hypertension. Kathleen [name] blood pressure is elevated once again in the office (she has a wrist cuff she brought from home, reading of 156/77). She states the drug store will not fill losartan. She denies chest pain. She is working weight loss to help control her blood pressure with the goal of decreasing her risk of heart attack and stroke. Kathleen [name] blood pressure is not currently controlled.  ASSESSMENT AND PLAN:  Vitamin D deficiency - Plan: Vitamin D, Ergocalciferol, (DRISDOL) 1.25 MG (50000 UT) CAPS capsule  Type 2 diabetes mellitus with hyperglycemia, without long-term current use of insulin (HCC) - Plan: metFORMIN  (GLUCOPHAGE) 500 MG tablet  Essential hypertension - Plan: valsartan (DIOVAN) 80 MG tablet  Class 3 severe obesity with serious comorbidity and body mass index (BMI) of 40.0 to 44.9 in adult, unspecified obesity type ([place])  PLAN:  Vitamin D Deficiency Kathleen [name] was informed that low vitamin D levels contributes to fatigue and are associated with obesity, breast, and colon cancer. Kathleen [name] to continue taking prescription Vit D @50 ,000 IU every week #4 and we will refill for 1 month. She will follow up for routine testing of vitamin D, at least 2-3 times per year. She was informed of the risk of over-replacement of vitamin D and [name] to not increase her dose unless she discusses this with [name]. Kathleen [name] to follow up with our clinic in 2 weeks.  Diabetes II with Hyperglycemia Kathleen [name] has been given extensive diabetes education by myself today including ideal fasting and post-prandial blood glucose readings, individual ideal Hgb A1c goals and hypoglycemia prevention. We discussed the importance of good blood sugar control to decrease the likelihood of diabetic complications such as nephropathy, neuropathy, limb loss, blindness, coronary artery disease, and death. We discussed the importance of intensive lifestyle modification including diet, exercise and weight loss as the first line treatment for diabetes. Kathleen [name] to continue taking metformin 500 mg PO q AM #30 and we will refill for 1 month. Kathleen [name] to follow up with our clinic in 2 weeks.  Hypertension We discussed sodium restriction, working on healthy weight loss, and a regular exercise program as the means to achieve improved blood pressure  control. Kathleen [name] agreed with this plan and agreed to follow up as directed. We will continue to monitor her blood pressure as well as her progress with the above lifestyle modifications. Kathleen [name] to stop losartan and she [name] to start valsartan 80 mg PO daily #30 with  no refills. She will watch for signs of hypotension as she continues her lifestyle modifications. Kathleen [name] to follow up with our clinic in 2 weeks.  Obesity Kathleen [name] is currently in the action stage of change. As such, her goal is to continue with weight loss efforts She has agreed to follow the Category 3 [place] has been instructed to work up to a goal of 150 minutes of combined cardio and strengthening exercise per week or plan for physical activity, walking the track at church in [place] 3 times per week for 15-30 minutes starting in January for weight loss and overall health benefits. We discussed the following Behavioral Modification Strategies today: increasing lean protein intake, increasing vegetables, work on meal planning and easy cooking plans, holiday eating strategies, celebration eating strategies, and planning for success   Kathleen [name] has agreed to follow up with our clinic in 2 weeks. She was informed of the importance of frequent follow up visits to maximize her success with intensive lifestyle modifications for her multiple health conditions.  ALLERGIES: Allergies  Allergen Reactions  . Mobic [Meloxicam] Shortness Of Breath    "Wheezing"   . Naproxen Sodium Swelling    Hands and feet  . Tape     Surgical tape adhesive and paper tape   . Doxycycline Itching and Rash    MEDICATIONS: Current Outpatient Medications on File Prior to Visit  Medication Sig Dispense Refill  . acetaminophen (TYLENOL) 500 MG tablet Take 500 mg by mouth every 6 (six) hours as needed. 1-2 tablets q 4-6 hrs            . aspirin 81 MG tablet Take 81 mg by mouth daily.    . betamethasone dipropionate (DIPROLENE) 0.05 % cream Apply topically 1 day or 1 dose.      . bisoprolol (ZEBETA) 5 MG tablet     . fexofenadine (ALLEGRA) 180 MG tablet Take 180 mg by mouth daily.     Marland Kitchen FIBER ADULT GUMMIES 2 g CHEW Chew 2 tablets by mouth daily.    . Glucosamine HCl 1500 MG TABS Take 2  tablets by mouth daily.    Marland Kitchen glucose blood test strip 1 each by Other route as needed for other. Use as instructed    . hydrochlorothiazide (HYDRODIURIL) 25 MG tablet     . ibuprofen (ADVIL,MOTRIN) 200 MG tablet Take 200 mg by mouth every 6 (six) hours as needed.      Javier Docker Oil 300 MG CAPS Take by mouth daily.      . metFORMIN (GLUCOPHAGE) 500 MG tablet Take 1 tablet (500 mg total) by mouth daily with breakfast. 30 tablet 0  . omeprazole (PRILOSEC) 20 MG capsule Take 20 mg by mouth daily.      Marland Kitchen omeprazole-sodium bicarbonate (ZEGERID) 40-1100 MG per capsule Take 1 capsule by mouth daily before breakfast.      . Probiotic Product (PROBIOTIC DAILY PO) Take by mouth.    . simvastatin (ZOCOR) 40 MG tablet Take 40 mg by mouth at bedtime.      . Vitamin D, Ergocalciferol, (DRISDOL) 1.25 MG (50000 UT) CAPS capsule Take 1 capsule (50,000 Units total) by mouth every 7 (seven) days. 4 capsule 0  No current facility-administered medications on file prior to visit.     PAST MEDICAL HISTORY: Past Medical History:  Diagnosis Date  . Arthritis   . Back pain   . Breast cancer ([place]) 2012  . Cancer ([place])    colon  . Diabetes mellitus   . Family history of breast cancer   . Family history of colon cancer   . GERD (gastroesophageal reflux disease)   . Hyperlipidemia   . Hypertension   . Joint pain   . Kidney problem   . Lactose intolerance   . Neuromuscular disorder ([place])    back, hip, leg left side  . Obesity   . Personal history of radiation therapy 2012  . Sleep apnea   . Spinal stenosis     PAST SURGICAL HISTORY: Past Surgical History:  Procedure Laterality Date  . ABDOMINAL HYSTERECTOMY    . BREAST EXCISIONAL BIOPSY Left 1972  . BREAST LUMPECTOMY Right 2012  . BREAST SURGERY     biopsy  . COLON SURGERY     partial colectomy  . X-STOP IMPLANTATION  September 26th,2012    SOCIAL HISTORY: Social History   Tobacco Use  . Smoking status: Former Research scientist (life sciences)  . Smokeless tobacco:  Never Used  Substance Use Topics  . Alcohol use: Yes  . Drug use: No    FAMILY HISTORY: Family History  Problem Relation Age of Onset  . Breast cancer Mother        dx late 50's  . Hyperlipidemia Mother   . Heart disease Mother   . Liver disease Mother   . Breast cancer Sister 13  . Heart disease Father   . Obesity Father   . Hypertension Father   . Diabetes Father   . Heart disease Maternal Grandmother   . Colon cancer Cousin        dx 69+  . Kidney cancer Son 80    ROS: Review of Systems  Constitutional: Positive for malaise/fatigue and weight loss.  Cardiovascular: Negative for chest pain.  Gastrointestinal: Negative for nausea and vomiting.  Musculoskeletal:       Negative muscle weakness  Endo/Heme/Allergies:       Negative hypoglycemia    PHYSICAL EXAM: Blood pressure (!) 154/84, pulse 60, temperature 97.7 F (36.5 C), temperature source Oral, height 5\' 4"  (1.626 m), weight 252 lb (114.3 kg), SpO2 97 %. Body mass index is 43.26 kg/m. Physical Exam Vitals signs reviewed.  Constitutional:      Appearance: Normal appearance. She is obese.  Cardiovascular:     Rate and Rhythm: Normal rate.  Pulmonary:     Effort: Pulmonary effort is normal.  Musculoskeletal: Normal range of motion.  Skin:    General: Skin is warm and dry.  Neurological:     Mental Status: She is alert and oriented to person, place, and time.  Psychiatric:        Mood and Affect: Mood normal.        Behavior: Behavior normal.     RECENT LABS AND TESTS: BMET    Component Value Date/Time   NA 138 04/12/2018 1051   NA 141 12/14/2014 0747   K 4.0 04/12/2018 1051   K 4.2 12/14/2014 0747   CL 97 04/12/2018 1051   CO2 25 04/12/2018 1051   CO2 28 12/14/2014 0747   GLUCOSE 138 (H) 04/12/2018 1051   GLUCOSE 120 12/14/2014 0747   BUN 18 04/12/2018 1051   BUN 21.3 12/14/2014 0747   CREATININE 0.79 04/12/2018 1051  CREATININE 0.9 12/14/2014 0747   CALCIUM 9.5 04/12/2018 1051    CALCIUM 9.4 12/14/2014 0747   GFRNONAA 73 04/12/2018 1051   GFRAA 84 04/12/2018 1051   Lab Results  Component Value Date   HGBA1C 6.8 (H) 04/12/2018   Lab Results  Component Value Date   INSULIN 30.6 (H) 04/12/2018   CBC    Component Value Date/Time   WBC 7.1 04/12/2018 1051   WBC 8.0 12/14/2014 0746   WBC 6.1 03/04/2011 1040   RBC 4.27 04/12/2018 1051   RBC 4.17 12/14/2014 0746   RBC 3.99 03/04/2011 1040   HGB 12.8 04/12/2018 1051   HGB 12.4 12/14/2014 0746   HCT 37.8 04/12/2018 1051   HCT 37.0 12/14/2014 0746   PLT 230 12/14/2014 0746   MCV 89 04/12/2018 1051   MCV 88.9 12/14/2014 0746   MCH 30.0 04/12/2018 1051   MCH 29.8 12/14/2014 0746   MCH 30.1 03/04/2011 1040   MCHC 33.9 04/12/2018 1051   MCHC 33.5 12/14/2014 0746   MCHC 33.1 03/04/2011 1040   RDW 12.4 04/12/2018 1051   RDW 12.5 12/14/2014 0746   LYMPHSABS 2.0 04/12/2018 1051   LYMPHSABS 1.8 12/14/2014 0746   MONOABS 0.5 12/14/2014 0746   EOSABS 0.2 04/12/2018 1051   BASOSABS 0.0 04/12/2018 1051   BASOSABS 0.1 12/14/2014 0746   Iron/TIBC/Ferritin/ %Sat No results found for: IRON, TIBC, FERRITIN, IRONPCTSAT Lipid Panel     Component Value Date/Time   CHOL 155 04/12/2018 1051   TRIG 123 04/12/2018 1051   HDL 60 04/12/2018 1051   LDLCALC 70 04/12/2018 1051   Hepatic Function Panel     Component Value Date/Time   PROT 7.2 04/12/2018 1051   PROT 7.1 12/14/2014 0747   ALBUMIN 4.4 04/12/2018 1051   ALBUMIN 3.8 12/14/2014 0747   AST 17 04/12/2018 1051   AST 16 12/14/2014 0747   ALT 20 04/12/2018 1051   ALT 15 12/14/2014 0747   ALKPHOS 76 04/12/2018 1051   ALKPHOS 52 12/14/2014 0747   BILITOT 0.4 04/12/2018 1051   BILITOT 0.40 12/14/2014 0747      Component Value Date/Time   TSH 1.480 04/12/2018 1051      OBESITY BEHAVIORAL INTERVENTION VISIT  Today's visit was # 4   Starting weight: 259 lbs Starting date: 04/12/18 Today's weight : 252 lbs Today's date: 06/02/2018 Total lbs lost to  date: 7 At least 15 minutes were spent on discussing the following behavioral intervention visit.   ASK: We discussed the diagnosis of obesity with [name] today and Kathleen [name] agreed to give [name] permission to discuss obesity behavioral modification therapy today.  ASSESS: Kathleen [name] has the diagnosis of obesity and her BMI today is 63.23 [place] is in the action stage of change   ADVISE: Kathleen [name] was educated on the multiple health risks of obesity as well as the benefit of weight loss to improve her health. She was advised of the need for long term treatment and the importance of lifestyle modifications to improve her current health and to decrease her risk of future health problems.  AGREE: Multiple dietary modification options and treatment options were discussed and  Kathleen [name] agreed to follow the recommendations documented in the above note.  ARRANGE: Kathleen [name] was educated on the importance of frequent visits to treat obesity as outlined per CMS and USPSTF guidelines and agreed to schedule her next follow up appointment today.  Kathleen [name], am acting as transcriptionist for Ilene Qua, MD  I have reviewed the  above documentation for accuracy and completeness, and I agree with the above. - Ilene Qua, MD

## 2018-06-14 ENCOUNTER — Other Ambulatory Visit (INDEPENDENT_AMBULATORY_CARE_PROVIDER_SITE_OTHER): Payer: Self-pay | Admitting: Family Medicine

## 2018-06-14 DIAGNOSIS — E559 Vitamin D deficiency, unspecified: Secondary | ICD-10-CM

## 2018-06-18 ENCOUNTER — Other Ambulatory Visit (INDEPENDENT_AMBULATORY_CARE_PROVIDER_SITE_OTHER): Payer: Self-pay | Admitting: Family Medicine

## 2018-06-18 DIAGNOSIS — E1165 Type 2 diabetes mellitus with hyperglycemia: Secondary | ICD-10-CM

## 2018-06-22 ENCOUNTER — Encounter (INDEPENDENT_AMBULATORY_CARE_PROVIDER_SITE_OTHER): Payer: Self-pay | Admitting: Family Medicine

## 2018-06-22 ENCOUNTER — Ambulatory Visit (INDEPENDENT_AMBULATORY_CARE_PROVIDER_SITE_OTHER): Payer: Medicare Other | Admitting: Family Medicine

## 2018-06-22 VITALS — BP 158/72 | HR 57 | Temp 97.7°F | Ht 64.0 in | Wt 251.0 lb

## 2018-06-22 DIAGNOSIS — I10 Essential (primary) hypertension: Secondary | ICD-10-CM | POA: Diagnosis not present

## 2018-06-22 DIAGNOSIS — E559 Vitamin D deficiency, unspecified: Secondary | ICD-10-CM

## 2018-06-22 DIAGNOSIS — Z6841 Body Mass Index (BMI) 40.0 and over, adult: Secondary | ICD-10-CM

## 2018-06-22 MED ORDER — CHLORTHALIDONE 25 MG PO TABS: 25.0000 mg | ORAL_TABLET | Freq: Every day | ORAL | 0 refills | Status: DC

## 2018-06-22 MED ORDER — VALSARTAN 80 MG PO TABS: 80.0000 mg | ORAL_TABLET | Freq: Every day | ORAL | 0 refills | Status: DC

## 2018-06-23 NOTE — Progress Notes (Signed)
Office: [phone]  /  Fax: [phone]   HPI:   Chief Complaint: Kathleen [name] is here to discuss her progress with her obesity treatment plan. She is on the Category 3 plan and is following her eating plan approximately 80 % of the time. She states she is exercising 0 minutes 0 times per week. [name] had a few family gatherings over the holidays and did a bit of indulgent eating but not much. She has gotten back on Category 3 since New Years. She is experiencing hunger 3-4 hours after lunch. She is not always getting all protein or all snacks in.  Her weight is 251 lb (113.9 kg) today and has had a weight loss of 1 pound over a period of 3 weeks since her last visit. She has lost 8 lbs since starting treatment with [name].  Hypertension [name] is a 78 y.o. female with hypertension. [name]'s blood pressure is elevated. She brought in her log of home blood pressure readings, but readings at home are significantly lower than readings in the office. She denies chest pain. She is working weight loss to help control her blood pressure with the goal of decreasing her risk of heart attack and stroke. [name]'s blood pressure is not currently controlled.  Vitamin D Deficiency [name] has a diagnosis of vitamin D deficiency. She is currently taking prescription Vit D. She notes fatigue and denies nausea, vomiting or muscle weakness.  ASSESSMENT AND PLAN:  Vitamin D deficiency  Essential hypertension - Plan: valsartan (DIOVAN) 80 MG tablet, chlorthalidone (HYGROTON) 25 MG tablet  Class 3 severe obesity with serious comorbidity and body mass index (BMI) of 40.0 to 44.9 in adult, unspecified obesity type ([place])  PLAN:  Hypertension We discussed sodium restriction, working on healthy weight loss, and a regular exercise program as the means to achieve improved blood pressure control. [name] agreed with this plan and agreed to follow up as directed. We will continue to monitor her  blood pressure as well as her progress with the above lifestyle modifications. [name] is to stop hydrochlorothiazide, and she agrees to start chlorthalidone 25 mg PO daily #30 with no refills. She agrees to continue taking Valsartan 80 mg PO daily #30 and we will refill for 1 month. She will watch for signs of hypotension as she continues her lifestyle modifications. [name] agrees to follow up with our clinic in 2 weeks.  Vitamin D Deficiency [name] was informed that low vitamin D levels contributes to fatigue and are associated with obesity, breast, and colon cancer. [name] agrees to continue taking prescription Vit D @50 ,000 IU every week, no refill needed. She will follow up for routine testing of vitamin D, at least 2-3 times per year. She was informed of the risk of over-replacement of vitamin D and agrees to not increase her dose unless she discusses this with [name]. [name] agrees to follow up with our clinic in 2 weeks.  Obesity [name] is currently in the action stage of change. As such, her goal is to continue with weight loss efforts She has agreed to follow the Category 3 [name] has been instructed to work up to a goal of 150 minutes of combined cardio and strengthening exercise per week for weight loss and overall health benefits. We discussed the following Behavioral Modification Strategies today: increasing lean protein intake, increasing vegetables, work on meal planning and easy cooking plans, and planning for success   [name] has agreed to follow up with our clinic in 2  weeks. She was informed of the importance of frequent follow up visits to maximize her success with intensive lifestyle modifications for her multiple health conditions.  ALLERGIES: Allergies  Allergen Reactions  . Mobic [Meloxicam] Shortness Of Breath    "Wheezing"   . Naproxen Sodium Swelling    Hands and feet  . Tape     Surgical tape adhesive and paper tape   . Doxycycline Itching  and Rash    MEDICATIONS: Current Outpatient Medications on File Prior to Visit  Medication Sig Dispense Refill  . acetaminophen (TYLENOL) 500 MG tablet Take 500 mg by mouth every 6 (six) hours as needed. 1-2 tablets q 4-6 hrs            . aspirin 81 MG tablet Take 81 mg by mouth daily.    . betamethasone dipropionate (DIPROLENE) 0.05 % cream Apply topically 1 day or 1 dose.      . bisoprolol (ZEBETA) 5 MG tablet     . fexofenadine (ALLEGRA) 180 MG tablet Take 180 mg by mouth daily.     Marland Kitchen FIBER ADULT GUMMIES 2 g CHEW Chew 2 tablets by mouth daily.    . Glucosamine HCl 1500 MG TABS Take 2 tablets by mouth daily.    Marland Kitchen glucose blood test strip 1 each by Other route as needed for other. Use as instructed    . ibuprofen (ADVIL,MOTRIN) 200 MG tablet Take 200 mg by mouth every 6 (six) hours as needed.      Javier Docker Oil 300 MG CAPS Take by mouth daily.      . metFORMIN (GLUCOPHAGE) 500 MG tablet Take 1 tablet (500 mg total) by mouth daily with breakfast. 30 tablet 0  . omeprazole (PRILOSEC) 20 MG capsule Take 20 mg by mouth daily.      Marland Kitchen omeprazole-sodium bicarbonate (ZEGERID) 40-1100 MG per capsule Take 1 capsule by mouth daily before breakfast.      . Probiotic Product (PROBIOTIC DAILY PO) Take by mouth.    . simvastatin (ZOCOR) 40 MG tablet Take 40 mg by mouth at bedtime.      . Vitamin D, Ergocalciferol, (DRISDOL) 1.25 MG (50000 UT) CAPS capsule Take 1 capsule (50,000 Units total) by mouth every 7 (seven) days. 4 capsule 0   No current facility-administered medications on file prior to visit.     PAST MEDICAL HISTORY: Past Medical History:  Diagnosis Date  . Arthritis   . Back pain   . Breast cancer ([place]) 2012  . Cancer ([place])    colon  . Diabetes mellitus   . Family history of breast cancer   . Family history of colon cancer   . GERD (gastroesophageal reflux disease)   . Hyperlipidemia   . Hypertension   . Joint pain   . Kidney problem   . Lactose intolerance   .  Neuromuscular disorder ([place])    back, hip, leg left side  . Obesity   . Personal history of radiation therapy 2012  . Sleep apnea   . Spinal stenosis     PAST SURGICAL HISTORY: Past Surgical History:  Procedure Laterality Date  . ABDOMINAL HYSTERECTOMY    . BREAST EXCISIONAL BIOPSY Left 1972  . BREAST LUMPECTOMY Right 2012  . BREAST SURGERY     biopsy  . COLON SURGERY     partial colectomy  . X-STOP IMPLANTATION  September 26th,2012    SOCIAL HISTORY: Social History   Tobacco Use  . Smoking status: Former Research scientist (life sciences)  . Smokeless  tobacco: Never Used  Substance Use Topics  . Alcohol use: Yes  . Drug use: No    FAMILY HISTORY: Family History  Problem Relation Age of Onset  . Breast cancer Mother        dx late 8's  . Hyperlipidemia Mother   . Heart disease Mother   . Liver disease Mother   . Breast cancer Sister 58  . Heart disease Father   . Obesity Father   . Hypertension Father   . Diabetes Father   . Heart disease Maternal Grandmother   . Colon cancer Cousin        dx 44+  . Kidney cancer Son 84    ROS: Review of Systems  Constitutional: Positive for malaise/fatigue and weight loss.  Cardiovascular: Negative for chest pain.  Gastrointestinal: Negative for nausea and vomiting.  Musculoskeletal:       Negative muscle weakness    PHYSICAL EXAM: Blood pressure (!) 158/72, pulse (!) 57, temperature 97.7 F (36.5 C), temperature source Oral, height 5\' 4"  (1.626 m), weight 251 lb (113.9 kg), SpO2 97 %. Body mass index is 43.08 kg/m. Physical Exam Vitals signs reviewed.  Constitutional:      Appearance: Normal appearance. She is obese.  Cardiovascular:     Rate and Rhythm: Normal rate.     Pulses: Normal pulses.  Pulmonary:     Effort: Pulmonary effort is normal.  Musculoskeletal: Normal range of motion.  Skin:    General: Skin is warm and dry.  Neurological:     Mental Status: She is alert and oriented to person, place, and time.  Psychiatric:          Mood and Affect: Mood normal.        Behavior: Behavior normal.     RECENT LABS AND TESTS: BMET    Component Value Date/Time   NA 138 04/12/2018 1051   NA 141 12/14/2014 0747   K 4.0 04/12/2018 1051   K 4.2 12/14/2014 0747   CL 97 04/12/2018 1051   CO2 25 04/12/2018 1051   CO2 28 12/14/2014 0747   GLUCOSE 138 (H) 04/12/2018 1051   GLUCOSE 120 12/14/2014 0747   BUN 18 04/12/2018 1051   BUN 21.3 12/14/2014 0747   CREATININE 0.79 04/12/2018 1051   CREATININE 0.9 12/14/2014 0747   CALCIUM 9.5 04/12/2018 1051   CALCIUM 9.4 12/14/2014 0747   GFRNONAA 73 04/12/2018 1051   GFRAA 84 04/12/2018 1051   Lab Results  Component Value Date   HGBA1C 6.8 (H) 04/12/2018   Lab Results  Component Value Date   INSULIN 30.6 (H) 04/12/2018   CBC    Component Value Date/Time   WBC 7.1 04/12/2018 1051   WBC 8.0 12/14/2014 0746   WBC 6.1 03/04/2011 1040   RBC 4.27 04/12/2018 1051   RBC 4.17 12/14/2014 0746   RBC 3.99 03/04/2011 1040   HGB 12.8 04/12/2018 1051   HGB 12.4 12/14/2014 0746   HCT 37.8 04/12/2018 1051   HCT 37.0 12/14/2014 0746   PLT 230 12/14/2014 0746   MCV 89 04/12/2018 1051   MCV 88.9 12/14/2014 0746   MCH 30.0 04/12/2018 1051   MCH 29.8 12/14/2014 0746   MCH 30.1 03/04/2011 1040   MCHC 33.9 04/12/2018 1051   MCHC 33.5 12/14/2014 0746   MCHC 33.1 03/04/2011 1040   RDW 12.4 04/12/2018 1051   RDW 12.5 12/14/2014 0746   LYMPHSABS 2.0 04/12/2018 1051   LYMPHSABS 1.8 12/14/2014 0746   MONOABS 0.5 12/14/2014  0746   EOSABS 0.2 04/12/2018 1051   BASOSABS 0.0 04/12/2018 1051   BASOSABS 0.1 12/14/2014 0746   Iron/TIBC/Ferritin/ %Sat No results found for: IRON, TIBC, FERRITIN, IRONPCTSAT Lipid Panel     Component Value Date/Time   CHOL 155 04/12/2018 1051   TRIG 123 04/12/2018 1051   HDL 60 04/12/2018 1051   LDLCALC 70 04/12/2018 1051   Hepatic Function Panel     Component Value Date/Time   PROT 7.2 04/12/2018 1051   PROT 7.1 12/14/2014 0747   ALBUMIN  4.4 04/12/2018 1051   ALBUMIN 3.8 12/14/2014 0747   AST 17 04/12/2018 1051   AST 16 12/14/2014 0747   ALT 20 04/12/2018 1051   ALT 15 12/14/2014 0747   ALKPHOS 76 04/12/2018 1051   ALKPHOS 52 12/14/2014 0747   BILITOT 0.4 04/12/2018 1051   BILITOT 0.40 12/14/2014 0747      Component Value Date/Time   TSH 1.480 04/12/2018 1051      OBESITY BEHAVIORAL INTERVENTION VISIT  Today's visit was # 5   Starting weight: 259 lbs Starting date: 04/12/18 Today's weight : 251 lbs Today's date: 06/22/2018 Total lbs lost to date: 8 At least 15 minutes were spent on discussing the following behavioral intervention visit.   ASK: We discussed the diagnosis of obesity with [name] today and [name] agreed to give [name] permission to discuss obesity behavioral modification therapy today.  ASSESS: [name] has the diagnosis of obesity and her BMI today is 58.06 [name] is in the action stage of change   ADVISE: [name] was educated on the multiple health risks of obesity as well as the benefit of weight loss to improve her health. She was advised of the need for long term treatment and the importance of lifestyle modifications to improve her current health and to decrease her risk of future health problems.  AGREE: Multiple dietary modification options and treatment options were discussed and  [name] agreed to follow the recommendations documented in the above note.  ARRANGE: [name] was educated on the importance of frequent visits to treat obesity as outlined per CMS and USPSTF guidelines and agreed to schedule her next follow up appointment today.  I, Trixie Dredge, am acting as transcriptionist for Ilene Qua, MD  I have reviewed the above documentation for accuracy and completeness, and I agree with the above. - Ilene Qua, MD

## 2018-07-12 ENCOUNTER — Encounter (INDEPENDENT_AMBULATORY_CARE_PROVIDER_SITE_OTHER): Payer: Self-pay | Admitting: Family Medicine

## 2018-07-12 ENCOUNTER — Ambulatory Visit (INDEPENDENT_AMBULATORY_CARE_PROVIDER_SITE_OTHER): Payer: Medicare Other | Admitting: Family Medicine

## 2018-07-12 VITALS — BP 192/77 | HR 56 | Temp 97.7°F | Ht 64.0 in | Wt 247.0 lb

## 2018-07-12 DIAGNOSIS — Z6841 Body Mass Index (BMI) 40.0 and over, adult: Secondary | ICD-10-CM | POA: Diagnosis not present

## 2018-07-12 DIAGNOSIS — I10 Essential (primary) hypertension: Secondary | ICD-10-CM | POA: Diagnosis not present

## 2018-07-12 DIAGNOSIS — E559 Vitamin D deficiency, unspecified: Secondary | ICD-10-CM

## 2018-07-12 MED ORDER — CHLORTHALIDONE 50 MG PO TABS: 50.0000 mg | ORAL_TABLET | Freq: Every day | ORAL | 0 refills | Status: DC

## 2018-07-12 MED ORDER — VITAMIN D (ERGOCALCIFEROL) 1.25 MG (50000 UNIT) PO CAPS: 50000.0000 [IU] | ORAL_CAPSULE | ORAL | 0 refills | Status: DC

## 2018-07-12 MED ORDER — VALSARTAN 80 MG PO TABS: 80.0000 mg | ORAL_TABLET | Freq: Every day | ORAL | 0 refills | Status: DC

## 2018-07-13 NOTE — Progress Notes (Signed)
Office: [phone]  /  Fax: [phone]   HPI:   Chief Complaint: Kathleen [name] is here to discuss her progress with her obesity treatment plan. She is on the Category 3 plan and is following her eating plan approximately 50 % of the time. She states she is walking for 30 minutes 2-3 times per week. [name] had a stomach virus all last week and couldn't eat much. She ate mostly toast and yogurt last week. She wants to work up to Category 3 again.  Her weight is 247 lb (112 kg) today and has had a weight loss of 4 pounds over a period of 3 weeks since her last visit. She has lost 12 lbs since starting treatment with [name].  Hypertension [name] is a 78 y.o. female with hypertension. [name]'s blood pressure is elevated. She states she took her bllod pressure at home and it was 110/74. She reports stress with driving. She denies chest pain, chest pressure, or headaches. She is working on weight loss to help control her blood pressure with the goal of decreasing her risk of heart attack and stroke. [name]'s blood pressure is not currently controlled.  Vitamin D Deficiency [name] has a diagnosis of vitamin D deficiency. She is currently taking prescription Vit D. She notes fatigue and denies nausea, vomiting or muscle weakness.  ASSESSMENT AND PLAN:  Essential hypertension - Plan: chlorthalidone (HYGROTON) 50 MG tablet, valsartan (DIOVAN) 80 MG tablet  Vitamin D deficiency - Plan: Vitamin D, Ergocalciferol, (DRISDOL) 1.25 MG (50000 UT) CAPS capsule  Class 3 severe obesity with serious comorbidity and body mass index (BMI) of 40.0 to 44.9 in adult, unspecified obesity type ([facility])  PLAN:  Hypertension We discussed sodium restriction, working on healthy weight loss, and a regular exercise program as the means to achieve improved blood pressure control. [name] agreed with this plan and agreed to follow up as directed. We will continue to monitor her blood pressure as well as  her progress with the above lifestyle modifications. [name] agrees to increase chlorthalidone to 50 mg PO daily #30 with no refills, and she agrees to continue taking valsartan 80 mg PO daily #30 and we will refill for 1 month. She will watch for signs of hypotension as she continues her lifestyle modifications. [name] agrees to follow up with our clinic in 2 weeks.  Vitamin D Deficiency [name] was informed that low vitamin D levels contributes to fatigue and are associated with obesity, breast, and colon cancer. [name] agrees to continue taking prescription Vit D @50 ,000 IU every week #4 and we will refill for 1 month. She will follow up for routine testing of vitamin D, at least 2-3 times per year. She was informed of the risk of over-replacement of vitamin D and agrees to not increase her dose unless she discusses this with [name]. [name] agrees to follow up with our clinic in 2 weeks.  Obesity [name] is currently in the action stage of change. As such, her goal is to continue with weight loss efforts She has agreed to follow the Category 3 [name] has been instructed to work up to a goal of 150 minutes of combined cardio and strengthening exercise per week for weight loss and overall health benefits. We discussed the following Behavioral Modification Strategies today: increasing lean protein intake, increasing vegetables and work on meal planning and easy cooking plans, and planning for success   [name] has agreed to follow up with our clinic in 2 weeks. She was  informed of the importance of frequent follow up visits to maximize her success with intensive lifestyle modifications for her multiple health conditions.  ALLERGIES: Allergies  Allergen Reactions  . Mobic [Meloxicam] Shortness Of Breath    "Wheezing"   . Naproxen Sodium Swelling    Hands and feet  . Tape     Surgical tape adhesive and paper tape   . Doxycycline Itching and Rash    MEDICATIONS: Current  Outpatient Medications on File Prior to Visit  Medication Sig Dispense Refill  . acetaminophen (TYLENOL) 500 MG tablet Take 500 mg by mouth every 6 (six) hours as needed. 1-2 tablets q 4-6 hrs            . aspirin 81 MG tablet Take 81 mg by mouth daily.    . betamethasone dipropionate (DIPROLENE) 0.05 % cream Apply topically 1 day or 1 dose.      . bisoprolol (ZEBETA) 5 MG tablet     . fexofenadine (ALLEGRA) 180 MG tablet Take 180 mg by mouth daily.     Marland Kitchen FIBER ADULT GUMMIES 2 g CHEW Chew 2 tablets by mouth daily.    . Glucosamine HCl 1500 MG TABS Take 2 tablets by mouth daily.    Marland Kitchen glucose blood test strip 1 each by Other route as needed for other. Use as instructed    . ibuprofen (ADVIL,MOTRIN) 200 MG tablet Take 200 mg by mouth every 6 (six) hours as needed.      Javier Docker Oil 300 MG CAPS Take by mouth daily.      . metFORMIN (GLUCOPHAGE) 500 MG tablet Take 1 tablet (500 mg total) by mouth daily with breakfast. 30 tablet 0  . omeprazole (PRILOSEC) 20 MG capsule Take 20 mg by mouth daily.      Marland Kitchen omeprazole-sodium bicarbonate (ZEGERID) 40-1100 MG per capsule Take 1 capsule by mouth daily before breakfast.      . Probiotic Product (PROBIOTIC DAILY PO) Take by mouth.    . simvastatin (ZOCOR) 40 MG tablet Take 40 mg by mouth at bedtime.       No current facility-administered medications on file prior to visit.     PAST MEDICAL HISTORY: Past Medical History:  Diagnosis Date  . Arthritis   . Back pain   . Breast cancer ([place]) 2012  . Cancer ([place])    colon  . Diabetes mellitus   . Family history of breast cancer   . Family history of colon cancer   . GERD (gastroesophageal reflux disease)   . Hyperlipidemia   . Hypertension   . Joint pain   . Kidney problem   . Lactose intolerance   . Neuromuscular disorder ([place])    back, hip, leg left side  . Obesity   . Personal history of radiation therapy 2012  . Sleep apnea   . Spinal stenosis     PAST SURGICAL HISTORY: Past  Surgical History:  Procedure Laterality Date  . ABDOMINAL HYSTERECTOMY    . BREAST EXCISIONAL BIOPSY Left 1972  . BREAST LUMPECTOMY Right 2012  . BREAST SURGERY     biopsy  . COLON SURGERY     partial colectomy  . X-STOP IMPLANTATION  September 26th,2012    SOCIAL HISTORY: Social History   Tobacco Use  . Smoking status: Former Research scientist (life sciences)  . Smokeless tobacco: Never Used  Substance Use Topics  . Alcohol use: Yes  . Drug use: No    FAMILY HISTORY: Family History  Problem Relation Age of Onset  .  Breast cancer Mother        dx late 72's  . Hyperlipidemia Mother   . Heart disease Mother   . Liver disease Mother   . Breast cancer Sister 40  . Heart disease Father   . Obesity Father   . Hypertension Father   . Diabetes Father   . Heart disease Maternal Grandmother   . Colon cancer Cousin        dx 99+  . Kidney cancer Son 76    ROS: Review of Systems  Constitutional: Positive for malaise/fatigue and weight loss.  Cardiovascular: Negative for chest pain.       Negative chest pressure  Gastrointestinal: Negative for nausea and vomiting.  Musculoskeletal:       Negative muscle weakness  Neurological: Negative for headaches.    PHYSICAL EXAM: Blood pressure (!) 192/77, pulse (!) 56, temperature 97.7 F (36.5 C), temperature source Oral, height 5\' 4"  (1.626 m), weight 247 lb (112 kg), SpO2 96 %. Body mass index is 42.4 kg/m. Physical Exam Vitals signs reviewed.  Constitutional:      Appearance: Normal appearance. She is obese.  Cardiovascular:     Rate and Rhythm: Normal rate.     Pulses: Normal pulses.  Pulmonary:     Effort: Pulmonary effort is normal.     Breath sounds: Normal breath sounds.  Musculoskeletal: Normal range of motion.  Skin:    General: Skin is warm and dry.  Neurological:     Mental Status: She is alert and oriented to person, place, and time.  Psychiatric:        Mood and Affect: Mood normal.        Behavior: Behavior normal.      RECENT LABS AND TESTS: BMET    Component Value Date/Time   NA 138 04/12/2018 1051   NA 141 12/14/2014 0747   K 4.0 04/12/2018 1051   K 4.2 12/14/2014 0747   CL 97 04/12/2018 1051   CO2 25 04/12/2018 1051   CO2 28 12/14/2014 0747   GLUCOSE 138 (H) 04/12/2018 1051   GLUCOSE 120 12/14/2014 0747   BUN 18 04/12/2018 1051   BUN 21.3 12/14/2014 0747   CREATININE 0.79 04/12/2018 1051   CREATININE 0.9 12/14/2014 0747   CALCIUM 9.5 04/12/2018 1051   CALCIUM 9.4 12/14/2014 0747   GFRNONAA 73 04/12/2018 1051   GFRAA 84 04/12/2018 1051   Lab Results  Component Value Date   HGBA1C 6.8 (H) 04/12/2018   Lab Results  Component Value Date   INSULIN 30.6 (H) 04/12/2018   CBC    Component Value Date/Time   WBC 7.1 04/12/2018 1051   WBC 8.0 12/14/2014 0746   WBC 6.1 03/04/2011 1040   RBC 4.27 04/12/2018 1051   RBC 4.17 12/14/2014 0746   RBC 3.99 03/04/2011 1040   HGB 12.8 04/12/2018 1051   HGB 12.4 12/14/2014 0746   HCT 37.8 04/12/2018 1051   HCT 37.0 12/14/2014 0746   PLT 230 12/14/2014 0746   MCV 89 04/12/2018 1051   MCV 88.9 12/14/2014 0746   MCH 30.0 04/12/2018 1051   MCH 29.8 12/14/2014 0746   MCH 30.1 03/04/2011 1040   MCHC 33.9 04/12/2018 1051   MCHC 33.5 12/14/2014 0746   MCHC 33.1 03/04/2011 1040   RDW 12.4 04/12/2018 1051   RDW 12.5 12/14/2014 0746   LYMPHSABS 2.0 04/12/2018 1051   LYMPHSABS 1.8 12/14/2014 0746   MONOABS 0.5 12/14/2014 0746   EOSABS 0.2 04/12/2018 1051   BASOSABS  0.0 04/12/2018 1051   BASOSABS 0.1 12/14/2014 0746   Iron/TIBC/Ferritin/ %Sat No results found for: IRON, TIBC, FERRITIN, IRONPCTSAT Lipid Panel     Component Value Date/Time   CHOL 155 04/12/2018 1051   TRIG 123 04/12/2018 1051   HDL 60 04/12/2018 1051   LDLCALC 70 04/12/2018 1051   Hepatic Function Panel     Component Value Date/Time   PROT 7.2 04/12/2018 1051   PROT 7.1 12/14/2014 0747   ALBUMIN 4.4 04/12/2018 1051   ALBUMIN 3.8 12/14/2014 0747   AST 17 04/12/2018  1051   AST 16 12/14/2014 0747   ALT 20 04/12/2018 1051   ALT 15 12/14/2014 0747   ALKPHOS 76 04/12/2018 1051   ALKPHOS 52 12/14/2014 0747   BILITOT 0.4 04/12/2018 1051   BILITOT 0.40 12/14/2014 0747      Component Value Date/Time   TSH 1.480 04/12/2018 1051      OBESITY BEHAVIORAL INTERVENTION VISIT  Today's visit was # 6   Starting weight: 259 lbs Starting date: 04/12/18 Today's weight : 247 lbs  Today's date: 07/12/2018 Total lbs lost to date: 12 At least 15 minutes were spent on discussing the following behavioral intervention visit.   ASK: We discussed the diagnosis of obesity with [name] today and [name] agreed to give [name] permission to discuss obesity behavioral modification therapy today.  ASSESS: [name] has the diagnosis of obesity and her BMI today is 99.38 [name] is in the action stage of change   ADVISE: [name] was educated on the multiple health risks of obesity as well as the benefit of weight loss to improve her health. She was advised of the need for long term treatment and the importance of lifestyle modifications to improve her current health and to decrease her risk of future health problems.  AGREE: Multiple dietary modification options and treatment options were discussed and  [name] agreed to follow the recommendations documented in the above note.  ARRANGE: [name] was educated on the importance of frequent visits to treat obesity as outlined per CMS and USPSTF guidelines and agreed to schedule her next follow up appointment today.  I, Trixie Dredge, am acting as transcriptionist for Ilene Qua, MD  I have reviewed the above documentation for accuracy and completeness, and I agree with the above. - Ilene Qua, MD

## 2018-07-19 ENCOUNTER — Encounter (INDEPENDENT_AMBULATORY_CARE_PROVIDER_SITE_OTHER): Payer: Self-pay | Admitting: Family Medicine

## 2018-07-19 ENCOUNTER — Telehealth (INDEPENDENT_AMBULATORY_CARE_PROVIDER_SITE_OTHER): Payer: Self-pay | Admitting: Family Medicine

## 2018-07-22 ENCOUNTER — Encounter (INDEPENDENT_AMBULATORY_CARE_PROVIDER_SITE_OTHER): Payer: Self-pay | Admitting: Family Medicine

## 2018-07-22 ENCOUNTER — Ambulatory Visit (INDEPENDENT_AMBULATORY_CARE_PROVIDER_SITE_OTHER): Payer: Medicare Other | Admitting: Family Medicine

## 2018-07-22 VITALS — BP 158/82 | HR 55 | Temp 97.4°F | Ht 64.0 in | Wt 245.0 lb

## 2018-07-22 DIAGNOSIS — E1165 Type 2 diabetes mellitus with hyperglycemia: Secondary | ICD-10-CM

## 2018-07-22 DIAGNOSIS — I10 Essential (primary) hypertension: Secondary | ICD-10-CM | POA: Diagnosis not present

## 2018-07-22 DIAGNOSIS — Z6841 Body Mass Index (BMI) 40.0 and over, adult: Secondary | ICD-10-CM | POA: Diagnosis not present

## 2018-07-22 MED ORDER — VALSARTAN 80 MG PO TABS: 80.0000 mg | ORAL_TABLET | Freq: Two times a day (BID) | ORAL | 0 refills | Status: DC

## 2018-07-22 MED ORDER — METFORMIN HCL 500 MG PO TABS: 500.0000 mg | ORAL_TABLET | Freq: Every day | ORAL | 0 refills | Status: DC

## 2018-07-23 LAB — COMPREHENSIVE METABOLIC PANEL
ALT: 18 IU/L (ref 0–32)
AST: 18 IU/L (ref 0–40)
Albumin/Globulin Ratio: 1.6 (ref 1.2–2.2)
Albumin: 4.4 g/dL (ref 3.7–4.7)
Alkaline Phosphatase: 72 IU/L (ref 39–117)
BILIRUBIN TOTAL: 0.3 mg/dL (ref 0.0–1.2)
BUN/Creatinine Ratio: 36 — ABNORMAL HIGH (ref 12–28)
BUN: 30 mg/dL — AB (ref 8–27)
CHLORIDE: 100 mmol/L (ref 96–106)
CO2: 26 mmol/L (ref 20–29)
Calcium: 9.7 mg/dL (ref 8.7–10.3)
Creatinine, Ser: 0.84 mg/dL (ref 0.57–1.00)
GFR calc non Af Amer: 68 mL/min/{1.73_m2} (ref >59)
GFR, EST AFRICAN AMERICAN: 78 mL/min/{1.73_m2} (ref >59)
GLUCOSE: 96 mg/dL (ref 65–99)
Globulin, Total: 2.8 g/dL (ref 1.5–4.5)
Potassium: 4.2 mmol/L (ref 3.5–5.2)
Sodium: 142 mmol/L (ref 134–144)
TOTAL PROTEIN: 7.2 g/dL (ref 6.0–8.5)

## 2018-07-24 NOTE — Progress Notes (Signed)
Office: [phone]  /  Fax: [phone]   HPI:   Chief Complaint: Kathleen [name] is here to discuss her progress with her obesity treatment plan. She is on the Category 3 plan and is following her eating plan approximately 85 % of the time. She states she is exercising 0 minutes 0 times per week. Kathleen [name] is feeling much better than previous. She had a stomach virus that lasted approximately 10 days and ended approximately 7 days ago. She has gotten more creative at putting food together. She notes occasional hunger.  Her weight is 245 lb (111.1 kg) today and has had a weight loss of 2 pounds over a period of 1-2 weeks since her last visit. She has lost 14 lbs since starting treatment with [name].  Hypertension Kathleen [name] is a 78 y.o. female with hypertension. Kathleen [name]'s blood pressure is uncontrolled today. She brought in her wrist reading from this morning which is controlled. She had a recent increase in chlorthalidone dose. She denies chest pain. She is working on weight loss to help control her blood pressure with the goal of decreasing her risk of heart attack and stroke.  Diabetes II with Hyperglycemia Kathleen [name] has a diagnosis of diabetes type II. Kathleen [name] is on metformin and she denies hypoglycemia. She denies carbohydrate cravings. Last A1c was 6.8. She has been working on intensive lifestyle modifications including diet, exercise, and weight loss to help control her blood glucose levels.  ASSESSMENT AND PLAN:  Type 2 diabetes mellitus with hyperglycemia, without long-term current use of insulin ([place]) - Plan: Comprehensive metabolic panel, metFORMIN (GLUCOPHAGE) 500 MG tablet  Essential hypertension - Plan: valsartan (DIOVAN) 80 MG tablet  Class 3 severe obesity with serious comorbidity and body mass index (BMI) of 40.0 to 44.9 in adult, unspecified obesity type ([place])  PLAN:  Hypertension We discussed sodium restriction, working on healthy weight loss, and a regular  exercise program as the means to achieve improved blood pressure control. Kathleen [name] agreed with this plan and agreed to follow up as directed. We will continue to monitor her blood pressure as well as her progress with the above lifestyle modifications. Kathleen [name] agrees to increase Valsartan to 80 mg PO BID #60 with no refills. She will watch for signs of hypotension as she continues her lifestyle modifications. We will check CMP today, and we will follow up on blood pressure at next appointment. Kathleen [name] agrees to follow up with our clinic in 2 weeks.  Diabetes II with Hyperglycemia Kathleen [name] has been given extensive diabetes education by myself today including ideal fasting and post-prandial blood glucose readings, individual ideal Hgb A1c goals and hypoglycemia prevention. We discussed the importance of good blood sugar control to decrease the likelihood of diabetic complications such as nephropathy, neuropathy, limb loss, blindness, coronary artery disease, and death. We discussed the importance of intensive lifestyle modification including diet, exercise and weight loss as the first line treatment for diabetes. Kathleen [name] agrees to continue taking metformin 500 mg PO daily #30 and we will refill for 1 month. Kathleen [name] agrees to follow up with our clinic in 2 weeks.  Obesity Kathleen [name] is currently in the action stage of change. As such, her goal is to continue with weight loss efforts She has agreed to follow the Category 3 [place] has been instructed to work up to a goal of 150 minutes of combined cardio and strengthening exercise per week for weight loss and overall health benefits. We discussed the following Behavioral Modification Strategies today: increasing lean protein  intake, increasing vegetables and work on meal planning and easy cooking plans, and planning for success   Kathleen [name] has agreed to follow up with our clinic in 2 weeks. She was informed of the importance of frequent follow up  visits to maximize her success with intensive lifestyle modifications for her multiple health conditions.  ALLERGIES: Allergies  Allergen Reactions  . Mobic [Meloxicam] Shortness Of Breath    "Wheezing"   . Naproxen Sodium Swelling    Hands and feet  . Tape     Surgical tape adhesive and paper tape   . Doxycycline Itching and Rash    MEDICATIONS: Current Outpatient Medications on File Prior to Visit  Medication Sig Dispense Refill  . acetaminophen (TYLENOL) 500 MG tablet Take 500 mg by mouth every 6 (six) hours as needed. 1-2 tablets q 4-6 hrs            . aspirin 81 MG tablet Take 81 mg by mouth daily.    . betamethasone dipropionate (DIPROLENE) 0.05 % cream Apply topically 1 day or 1 dose.      . bisoprolol (ZEBETA) 5 MG tablet     . chlorthalidone (HYGROTON) 50 MG tablet Take 1 tablet (50 mg total) by mouth daily. 30 tablet 0  . fexofenadine (ALLEGRA) 180 MG tablet Take 180 mg by mouth daily.     Marland Kitchen FIBER ADULT GUMMIES 2 g CHEW Chew 2 tablets by mouth daily.    . Glucosamine HCl 1500 MG TABS Take 2 tablets by mouth daily.    Marland Kitchen glucose blood test strip 1 each by Other route as needed for other. Use as instructed    . ibuprofen (ADVIL,MOTRIN) 200 MG tablet Take 200 mg by mouth every 6 (six) hours as needed.      Javier Docker Oil 300 MG CAPS Take by mouth daily.      Marland Kitchen omeprazole (PRILOSEC) 20 MG capsule Take 20 mg by mouth daily.      Marland Kitchen omeprazole-sodium bicarbonate (ZEGERID) 40-1100 MG per capsule Take 1 capsule by mouth daily before breakfast.      . Probiotic Product (PROBIOTIC DAILY PO) Take by mouth.    . simvastatin (ZOCOR) 40 MG tablet Take 40 mg by mouth at bedtime.      . Vitamin D, Ergocalciferol, (DRISDOL) 1.25 MG (50000 UT) CAPS capsule Take 1 capsule (50,000 Units total) by mouth every 7 (seven) days. 4 capsule 0   No current facility-administered medications on file prior to visit.     PAST MEDICAL HISTORY: Past Medical History:  Diagnosis Date  . Arthritis     . Back pain   . Breast cancer ([place]) 2012  . Cancer ([place])    colon  . Diabetes mellitus   . Family history of breast cancer   . Family history of colon cancer   . GERD (gastroesophageal reflux disease)   . Hyperlipidemia   . Hypertension   . Joint pain   . Kidney problem   . Lactose intolerance   . Neuromuscular disorder ([place])    back, hip, leg left side  . Obesity   . Personal history of radiation therapy 2012  . Sleep apnea   . Spinal stenosis     PAST SURGICAL HISTORY: Past Surgical History:  Procedure Laterality Date  . ABDOMINAL HYSTERECTOMY    . BREAST EXCISIONAL BIOPSY Left 1972  . BREAST LUMPECTOMY Right 2012  . BREAST SURGERY     biopsy  . COLON SURGERY     partial  colectomy  . X-STOP IMPLANTATION  September 26th,2012    SOCIAL HISTORY: Social History   Tobacco Use  . Smoking status: Former Research scientist (life sciences)  . Smokeless tobacco: Never Used  Substance Use Topics  . Alcohol use: Yes  . Drug use: No    FAMILY HISTORY: Family History  Problem Relation Age of Onset  . Breast cancer Mother        dx late 30's  . Hyperlipidemia Mother   . Heart disease Mother   . Liver disease Mother   . Breast cancer Sister 5  . Heart disease Father   . Obesity Father   . Hypertension Father   . Diabetes Father   . Heart disease Maternal Grandmother   . Colon cancer Cousin        dx 14+  . Kidney cancer Son 43    ROS: Review of Systems  Constitutional: Positive for weight loss.  Endo/Heme/Allergies:       Negative hypoglycemia    PHYSICAL EXAM: Blood pressure (!) 158/82, pulse (!) 55, temperature (!) 97.4 F (36.3 C), temperature source Oral, height 5\' 4"  (1.626 m), weight 245 lb (111.1 kg), SpO2 95 %. Body mass index is 42.05 kg/m. Physical Exam Vitals signs reviewed.  Constitutional:      Appearance: Normal appearance. She is obese.  Cardiovascular:     Rate and Rhythm: Normal rate.     Pulses: Normal pulses.  Pulmonary:     Effort: Pulmonary effort is  normal.     Breath sounds: Normal breath sounds.  Musculoskeletal: Normal range of motion.  Skin:    General: Skin is warm and dry.  Neurological:     Mental Status: She is alert and oriented to person, place, and time.  Psychiatric:        Mood and Affect: Mood normal.        Behavior: Behavior normal.     RECENT LABS AND TESTS: BMET    Component Value Date/Time   NA 142 07/22/2018 1427   NA 141 12/14/2014 0747   K 4.2 07/22/2018 1427   K 4.2 12/14/2014 0747   CL 100 07/22/2018 1427   CO2 26 07/22/2018 1427   CO2 28 12/14/2014 0747   GLUCOSE 96 07/22/2018 1427   GLUCOSE 120 12/14/2014 0747   BUN 30 (H) 07/22/2018 1427   BUN 21.3 12/14/2014 0747   CREATININE 0.84 07/22/2018 1427   CREATININE 0.9 12/14/2014 0747   CALCIUM 9.7 07/22/2018 1427   CALCIUM 9.4 12/14/2014 0747   GFRNONAA 68 07/22/2018 1427   GFRAA 78 07/22/2018 1427   Lab Results  Component Value Date   HGBA1C 6.8 (H) 04/12/2018   Lab Results  Component Value Date   INSULIN 30.6 (H) 04/12/2018   CBC    Component Value Date/Time   WBC 7.1 04/12/2018 1051   WBC 8.0 12/14/2014 0746   WBC 6.1 03/04/2011 1040   RBC 4.27 04/12/2018 1051   RBC 4.17 12/14/2014 0746   RBC 3.99 03/04/2011 1040   HGB 12.8 04/12/2018 1051   HGB 12.4 12/14/2014 0746   HCT 37.8 04/12/2018 1051   HCT 37.0 12/14/2014 0746   PLT 230 12/14/2014 0746   MCV 89 04/12/2018 1051   MCV 88.9 12/14/2014 0746   MCH 30.0 04/12/2018 1051   MCH 29.8 12/14/2014 0746   MCH 30.1 03/04/2011 1040   MCHC 33.9 04/12/2018 1051   MCHC 33.5 12/14/2014 0746   MCHC 33.1 03/04/2011 1040   RDW 12.4 04/12/2018 1051  RDW 12.5 12/14/2014 0746   LYMPHSABS 2.0 04/12/2018 1051   LYMPHSABS 1.8 12/14/2014 0746   MONOABS 0.5 12/14/2014 0746   EOSABS 0.2 04/12/2018 1051   BASOSABS 0.0 04/12/2018 1051   BASOSABS 0.1 12/14/2014 0746   Iron/TIBC/Ferritin/ %Sat No results found for: IRON, TIBC, FERRITIN, IRONPCTSAT Lipid Panel     Component Value  Date/Time   CHOL 155 04/12/2018 1051   TRIG 123 04/12/2018 1051   HDL 60 04/12/2018 1051   LDLCALC 70 04/12/2018 1051   Hepatic Function Panel     Component Value Date/Time   PROT 7.2 07/22/2018 1427   PROT 7.1 12/14/2014 0747   ALBUMIN 4.4 07/22/2018 1427   ALBUMIN 3.8 12/14/2014 0747   AST 18 07/22/2018 1427   AST 16 12/14/2014 0747   ALT 18 07/22/2018 1427   ALT 15 12/14/2014 0747   ALKPHOS 72 07/22/2018 1427   ALKPHOS 52 12/14/2014 0747   BILITOT 0.3 07/22/2018 1427   BILITOT 0.40 12/14/2014 0747      Component Value Date/Time   TSH 1.480 04/12/2018 1051      OBESITY BEHAVIORAL INTERVENTION VISIT  Today's visit was # 7   Starting weight: 259 lbs Starting date: 04/12/18 Today's weight : 245 lbs  Today's date: 07/22/2018 Total lbs lost to date: 14 At least 15 minutes were spent on discussing the following behavioral intervention visit.   ASK: We discussed the diagnosis of obesity with Kathleen [name] today and Kathleen [name] agreed to give [name] permission to discuss obesity behavioral modification therapy today.  ASSESS: Kathleen [name] has the diagnosis of obesity and her BMI today is 38.03 Kathleen [name] is in the action stage of change   ADVISE: Kathleen [name] was educated on the multiple health risks of obesity as well as the benefit of weight loss to improve her health. She was advised of the need for long term treatment and the importance of lifestyle modifications to improve her current health and to decrease her risk of future health problems.  AGREE: Multiple dietary modification options and treatment options were discussed and  Kathleen [name] agreed to follow the recommendations documented in the above note.  ARRANGE: Kathleen [name] was educated on the importance of frequent visits to treat obesity as outlined per CMS and USPSTF guidelines and agreed to schedule her next follow up appointment today.  I, Trixie Dredge, am acting as transcriptionist for Ilene Qua, MD  I have  reviewed the above documentation for accuracy and completeness, and I agree with the above. - Ilene Qua, MD

## 2018-08-05 ENCOUNTER — Ambulatory Visit (INDEPENDENT_AMBULATORY_CARE_PROVIDER_SITE_OTHER): Payer: Medicare Other | Admitting: Family Medicine

## 2018-08-05 ENCOUNTER — Encounter (INDEPENDENT_AMBULATORY_CARE_PROVIDER_SITE_OTHER): Payer: Self-pay | Admitting: Family Medicine

## 2018-08-05 VITALS — BP 140/74 | HR 61 | Temp 97.7°F | Ht 64.0 in | Wt 244.0 lb

## 2018-08-05 DIAGNOSIS — E559 Vitamin D deficiency, unspecified: Secondary | ICD-10-CM

## 2018-08-05 DIAGNOSIS — I10 Essential (primary) hypertension: Secondary | ICD-10-CM

## 2018-08-05 DIAGNOSIS — Z6841 Body Mass Index (BMI) 40.0 and over, adult: Secondary | ICD-10-CM

## 2018-08-05 MED ORDER — VITAMIN D (ERGOCALCIFEROL) 1.25 MG (50000 UNIT) PO CAPS: 50000.0000 [IU] | ORAL_CAPSULE | ORAL | 0 refills | Status: DC

## 2018-08-09 NOTE — Progress Notes (Signed)
Office: [phone]  /  Fax: [phone]   HPI:   Chief Complaint: [name] is here to discuss her progress with her obesity treatment plan. She is on the Category 3 plan and is following her eating plan approximately 80 % of the time. She states she is exercising 0 minutes 0 times per week. Kathleen [name] had cataract surgery on her right eye and recovery went well. Her left eye cataract surgery is March 2nd. She has gone out to eat a few times and got cottage cheese as a side.  Her weight is 244 lb (110.7 kg) today and has had a weight loss of 1 pound over a period of 2 weeks since her last visit. She has lost 15 lbs since starting treatment with [name].  Vitamin D Deficiency Kathleen [name] has a diagnosis of vitamin D deficiency. She is currently taking prescription Vit D. She notes fatigue and denies nausea, vomiting or muscle weakness.  Hypertension Kathleen [name] is a 78 y.o. female with hypertension. Kathleen [name]'s blood pressure is better controlled today. She denies chest pain, chest pressure, or headaches. She is working on weight loss to help control her blood pressure with the goal of decreasing her risk of heart attack and stroke.   ASSESSMENT AND PLAN:  Vitamin D deficiency - Plan: Vitamin D, Ergocalciferol, (DRISDOL) 1.25 MG (50000 UT) CAPS capsule  Essential hypertension  Class 3 severe obesity with serious comorbidity and body mass index (BMI) of 40.0 to 44.9 in adult, unspecified obesity type ([place])  PLAN:  Vitamin D Deficiency Kathleen [name] was informed that low vitamin D levels contributes to fatigue and are associated with obesity, breast, and colon cancer. Kathleen [name] agrees to continue taking prescription Vit D @50 ,000 IU every week #4 and we will refill for 1 month. She will follow up for routine testing of vitamin D, at least 2-3 times per year. She was informed of the risk of over-replacement of vitamin D and agrees to not increase her dose unless she discusses this with [name]  first. Kathleen [name] agrees to follow up with our clinic in 2 weeks.  Hypertension We discussed sodium restriction, working on healthy weight loss, and a regular exercise program as the means to achieve improved blood pressure control. Kathleen [name] agreed with this plan and agreed to follow up as directed. We will continue to monitor her blood pressure as well as her progress with the above lifestyle modifications. Kathleen [name] will continue her current medications and will watch for signs of hypotension as she continues her lifestyle modifications. Kathleen [name] agrees to follow up with our clinic in 2 weeks.  Obesity Kathleen [name] is currently in the action stage of change. As such, her goal is to continue with weight loss efforts She has agreed to follow the Category 3 [place] has been instructed to work up to a goal of 150 minutes of combined cardio and strengthening exercise per week for weight loss and overall health benefits. We discussed the following Behavioral Modification Strategies today: increasing lean protein intake, increasing vegetables and work on meal planning and easy cooking plans, and planning for success Kathleen [name] is to find dumbbells at home and we will discuss resistant training at next appointment.  Kathleen [name] has agreed to follow up with our clinic in 2 weeks. She was informed of the importance of frequent follow up visits to maximize her success with intensive lifestyle modifications for her multiple health conditions.  ALLERGIES: Allergies  Allergen Reactions  . Mobic [Meloxicam] Shortness Of Breath    "Wheezing"   .  Naproxen Sodium Swelling    Hands and feet  . Tape     Surgical tape adhesive and paper tape   . Doxycycline Itching and Rash    MEDICATIONS: Current Outpatient Medications on File Prior to Visit  Medication Sig Dispense Refill  . acetaminophen (TYLENOL) 500 MG tablet Take 500 mg by mouth every 6 (six) hours as needed. 1-2 tablets q 4-6 hrs            .  aspirin 81 MG tablet Take 81 mg by mouth daily.    . betamethasone dipropionate (DIPROLENE) 0.05 % cream Apply topically 1 day or 1 dose.      . bisoprolol (ZEBETA) 5 MG tablet     . chlorthalidone (HYGROTON) 50 MG tablet Take 1 tablet (50 mg total) by mouth daily. 30 tablet 0  . fexofenadine (ALLEGRA) 180 MG tablet Take 180 mg by mouth daily.     Marland Kitchen FIBER ADULT GUMMIES 2 g CHEW Chew 2 tablets by mouth daily.    . Glucosamine HCl 1500 MG TABS Take 2 tablets by mouth daily.    Marland Kitchen glucose blood test strip 1 each by Other route as needed for other. Use as instructed    . ibuprofen (ADVIL,MOTRIN) 200 MG tablet Take 200 mg by mouth every 6 (six) hours as needed.      Javier Docker Oil 300 MG CAPS Take by mouth daily.      . metFORMIN (GLUCOPHAGE) 500 MG tablet Take 1 tablet (500 mg total) by mouth daily with breakfast. 30 tablet 0  . omeprazole (PRILOSEC) 20 MG capsule Take 20 mg by mouth daily.      Marland Kitchen omeprazole-sodium bicarbonate (ZEGERID) 40-1100 MG per capsule Take 1 capsule by mouth daily before breakfast.      . Probiotic Product (PROBIOTIC DAILY PO) Take by mouth.    . simvastatin (ZOCOR) 40 MG tablet Take 40 mg by mouth at bedtime.      . valsartan (DIOVAN) 80 MG tablet Take 1 tablet (80 mg total) by mouth 2 (two) times daily. 60 tablet 0   No current facility-administered medications on file prior to visit.     PAST MEDICAL HISTORY: Past Medical History:  Diagnosis Date  . Arthritis   . Back pain   . Breast cancer ([place]) 2012  . Cancer ([place])    colon  . Diabetes mellitus   . Family history of breast cancer   . Family history of colon cancer   . GERD (gastroesophageal reflux disease)   . Hyperlipidemia   . Hypertension   . Joint pain   . Kidney problem   . Lactose intolerance   . Neuromuscular disorder ([place])    back, hip, leg left side  . Obesity   . Personal history of radiation therapy 2012  . Sleep apnea   . Spinal stenosis     PAST SURGICAL HISTORY: Past Surgical History:    Procedure Laterality Date  . ABDOMINAL HYSTERECTOMY    . BREAST EXCISIONAL BIOPSY Left 1972  . BREAST LUMPECTOMY Right 2012  . BREAST SURGERY     biopsy  . COLON SURGERY     partial colectomy  . X-STOP IMPLANTATION  September 26th,2012    SOCIAL HISTORY: Social History   Tobacco Use  . Smoking status: Former Research scientist (life sciences)  . Smokeless tobacco: Never Used  Substance Use Topics  . Alcohol use: Yes  . Drug use: No    FAMILY HISTORY: Family History  Problem Relation Age of Onset  .  Breast cancer Mother        dx late 60's  . Hyperlipidemia Mother   . Heart disease Mother   . Liver disease Mother   . Breast cancer Sister 65  . Heart disease Father   . Obesity Father   . Hypertension Father   . Diabetes Father   . Heart disease Maternal Grandmother   . Colon cancer Cousin        dx 56+  . Kidney cancer Son 28    ROS: Review of Systems  Constitutional: Positive for malaise/fatigue and weight loss.  Cardiovascular: Negative for chest pain.       Negative chest pressure  Gastrointestinal: Negative for nausea and vomiting.  Musculoskeletal:       Negative muscle weakness  Neurological: Negative for headaches.    PHYSICAL EXAM: Blood pressure 140/74, pulse 61, temperature 97.7 F (36.5 C), temperature source Oral, height 5\' 4"  (1.626 m), weight 244 lb (110.7 kg), SpO2 99 %. Body mass index is 41.88 kg/m. Physical Exam Vitals signs reviewed.  Constitutional:      Appearance: Normal appearance. She is obese.  Cardiovascular:     Rate and Rhythm: Normal rate.     Pulses: Normal pulses.  Pulmonary:     Effort: Pulmonary effort is normal.     Breath sounds: Normal breath sounds.  Musculoskeletal: Normal range of motion.  Skin:    General: Skin is warm and dry.  Neurological:     Mental Status: She is alert and oriented to person, place, and time.  Psychiatric:        Mood and Affect: Mood normal.        Behavior: Behavior normal.     RECENT LABS AND  TESTS: BMET    Component Value Date/Time   NA 142 07/22/2018 1427   NA 141 12/14/2014 0747   K 4.2 07/22/2018 1427   K 4.2 12/14/2014 0747   CL 100 07/22/2018 1427   CO2 26 07/22/2018 1427   CO2 28 12/14/2014 0747   GLUCOSE 96 07/22/2018 1427   GLUCOSE 120 12/14/2014 0747   BUN 30 (H) 07/22/2018 1427   BUN 21.3 12/14/2014 0747   CREATININE 0.84 07/22/2018 1427   CREATININE 0.9 12/14/2014 0747   CALCIUM 9.7 07/22/2018 1427   CALCIUM 9.4 12/14/2014 0747   GFRNONAA 68 07/22/2018 1427   GFRAA 78 07/22/2018 1427   Lab Results  Component Value Date   HGBA1C 6.8 (H) 04/12/2018   Lab Results  Component Value Date   INSULIN 30.6 (H) 04/12/2018   CBC    Component Value Date/Time   WBC 7.1 04/12/2018 1051   WBC 8.0 12/14/2014 0746   WBC 6.1 03/04/2011 1040   RBC 4.27 04/12/2018 1051   RBC 4.17 12/14/2014 0746   RBC 3.99 03/04/2011 1040   HGB 12.8 04/12/2018 1051   HGB 12.4 12/14/2014 0746   HCT 37.8 04/12/2018 1051   HCT 37.0 12/14/2014 0746   PLT 230 12/14/2014 0746   MCV 89 04/12/2018 1051   MCV 88.9 12/14/2014 0746   MCH 30.0 04/12/2018 1051   MCH 29.8 12/14/2014 0746   MCH 30.1 03/04/2011 1040   MCHC 33.9 04/12/2018 1051   MCHC 33.5 12/14/2014 0746   MCHC 33.1 03/04/2011 1040   RDW 12.4 04/12/2018 1051   RDW 12.5 12/14/2014 0746   LYMPHSABS 2.0 04/12/2018 1051   LYMPHSABS 1.8 12/14/2014 0746   MONOABS 0.5 12/14/2014 0746   EOSABS 0.2 04/12/2018 1051   BASOSABS 0.0 04/12/2018  1051   BASOSABS 0.1 12/14/2014 0746   Iron/TIBC/Ferritin/ %Sat No results found for: IRON, TIBC, FERRITIN, IRONPCTSAT Lipid Panel     Component Value Date/Time   CHOL 155 04/12/2018 1051   TRIG 123 04/12/2018 1051   HDL 60 04/12/2018 1051   LDLCALC 70 04/12/2018 1051   Hepatic Function Panel     Component Value Date/Time   PROT 7.2 07/22/2018 1427   PROT 7.1 12/14/2014 0747   ALBUMIN 4.4 07/22/2018 1427   ALBUMIN 3.8 12/14/2014 0747   AST 18 07/22/2018 1427   AST 16  12/14/2014 0747   ALT 18 07/22/2018 1427   ALT 15 12/14/2014 0747   ALKPHOS 72 07/22/2018 1427   ALKPHOS 52 12/14/2014 0747   BILITOT 0.3 07/22/2018 1427   BILITOT 0.40 12/14/2014 0747      Component Value Date/Time   TSH 1.480 04/12/2018 1051      OBESITY BEHAVIORAL INTERVENTION VISIT  Today's visit was # 8   Starting weight: 259 lbs Starting date: 04/12/18 Today's weight : 244 lbs  Today's date: 08/05/2018 Total lbs lost to date: 15 At least 15 minutes were spent on discussing the following behavioral intervention visit.   ASK: We discussed the diagnosis of obesity with Kathleen [name] today and Kathleen [name] agreed to give [name] permission to discuss obesity behavioral modification therapy today.  ASSESS: Kathleen [name] has the diagnosis of obesity and her BMI today is 69.86 Kathleen [name] is in the action stage of change   ADVISE: Kathleen [name] was educated on the multiple health risks of obesity as well as the benefit of weight loss to improve her health. She was advised of the need for long term treatment and the importance of lifestyle modifications to improve her current health and to decrease her risk of future health problems.  AGREE: Multiple dietary modification options and treatment options were discussed and  Kathleen [name] agreed to follow the recommendations documented in the above note.  ARRANGE: Kathleen [name] was educated on the importance of frequent visits to treat obesity as outlined per CMS and USPSTF guidelines and agreed to schedule her next follow up appointment today.  I, Trixie Dredge, am acting as transcriptionist for Ilene Qua, MD  I have reviewed the above documentation for accuracy and completeness, and I agree with the above. - Ilene Qua, MD

## 2018-08-24 ENCOUNTER — Ambulatory Visit (INDEPENDENT_AMBULATORY_CARE_PROVIDER_SITE_OTHER): Payer: Medicare Other | Admitting: Physician Assistant

## 2018-08-24 ENCOUNTER — Encounter (INDEPENDENT_AMBULATORY_CARE_PROVIDER_SITE_OTHER): Payer: Self-pay | Admitting: Physician Assistant

## 2018-08-24 VITALS — BP 151/77 | HR 54 | Temp 97.7°F | Ht 64.0 in | Wt 243.0 lb

## 2018-08-24 DIAGNOSIS — E559 Vitamin D deficiency, unspecified: Secondary | ICD-10-CM | POA: Diagnosis not present

## 2018-08-24 DIAGNOSIS — I10 Essential (primary) hypertension: Secondary | ICD-10-CM

## 2018-08-24 DIAGNOSIS — Z6841 Body Mass Index (BMI) 40.0 and over, adult: Secondary | ICD-10-CM | POA: Diagnosis not present

## 2018-08-24 DIAGNOSIS — E119 Type 2 diabetes mellitus without complications: Secondary | ICD-10-CM

## 2018-08-24 DIAGNOSIS — Z9189 Other specified personal risk factors, not elsewhere classified: Secondary | ICD-10-CM

## 2018-08-24 DIAGNOSIS — E7849 Other hyperlipidemia: Secondary | ICD-10-CM | POA: Diagnosis not present

## 2018-08-24 MED ORDER — METFORMIN HCL 500 MG PO TABS: 500.0000 mg | ORAL_TABLET | Freq: Every day | ORAL | 0 refills | Status: DC

## 2018-08-24 MED ORDER — VALSARTAN 80 MG PO TABS: 80.0000 mg | ORAL_TABLET | Freq: Two times a day (BID) | ORAL | 0 refills | Status: DC

## 2018-08-24 MED ORDER — CHLORTHALIDONE 50 MG PO TABS: 50.0000 mg | ORAL_TABLET | Freq: Every day | ORAL | 0 refills | Status: DC

## 2018-08-24 MED ORDER — SIMVASTATIN 40 MG PO TABS: 40.0000 mg | ORAL_TABLET | Freq: Every day | ORAL | 0 refills | Status: DC

## 2018-08-24 MED ORDER — VITAMIN D (ERGOCALCIFEROL) 1.25 MG (50000 UNIT) PO CAPS: 50000.0000 [IU] | ORAL_CAPSULE | ORAL | 0 refills | Status: DC

## 2018-08-24 NOTE — Progress Notes (Signed)
Office: [phone]  /  Fax: [phone]   HPI:   Chief Complaint: Kathleen [name] is here to discuss her progress with her obesity treatment plan. She is on the Category 3 plan and is following her eating plan approximately 75% of the time. She states she is exercising 0 minutes 0 times per week. Kathleen [name] did well with weight loss. She reports struggling to get all of her protein in, especially at dinner. Her weight is 243 lb (110.2 kg) [name] and has had a weight loss of 1 pound over a period of 3 weeks since her last visit. She has lost 16 lbs since starting treatment with [name].  Vitamin D deficiency Kathleen [name] has a diagnosis of Vitamin D deficiency. She is currently taking prescription Vit D and denies nausea, vomiting or diarrhea.  Hyperlipidemia Kathleen [name] has hyperlipidemia and is on simvastatin. She has been trying to improve her cholesterol levels with intensive lifestyle modification including a low saturated fat diet, exercise and weight loss. She denies any chest pain.  At risk for cardiovascular disease Kathleen [name] is at a higher than average risk for cardiovascular disease due to obesity. She currently denies any chest pain.  Diabetes II Kathleen [name] has a diagnosis of diabetes type II. Kathleen [name] does not report checking her blood sugars.  Last A1c was 6.8 on 04/12/2018. She has been working on intensive lifestyle modifications including diet, exercise, and weight loss to help control her blood glucose levels. She denies nausea, vomiting, diarrhea, or polyphagia.  Hypertension Kathleen [name] is a 78 y.o. female with hypertension.  Kathleen [name] denies chest pain or shortness of breath on exertion. She is working weight loss to help control her blood pressure with the goal of decreasing her risk of heart attack and stroke. Kathleen [name] blood pressure is slightly elevated [name].   ASSESSMENT AND PLAN:  Type 2 diabetes mellitus without complication, without long-term current use of  insulin (HCC) - Plan: Hemoglobin A1c, Insulin, random, Comprehensive metabolic panel, metFORMIN (GLUCOPHAGE) 500 MG tablet  Vitamin D deficiency - Plan: VITAMIN D 25 Hydroxy (Vit-D Deficiency, Fractures), Vitamin D, Ergocalciferol, (DRISDOL) 1.25 MG (50000 UT) CAPS capsule  Other hyperlipidemia - Plan: simvastatin (ZOCOR) 40 MG tablet  Essential hypertension - Plan: chlorthalidone (HYGROTON) 50 MG tablet, valsartan (DIOVAN) 80 MG tablet  At risk for heart disease  Class 3 severe obesity with serious comorbidity and body mass index (BMI) of 40.0 to 44.9 in adult, unspecified obesity type (HCC)  PLAN:  Vitamin D Deficiency Kathleen [name] that low Vitamin D levels contributes to fatigue and are associated with obesity, breast, and colon cancer. She agrees to continue to take prescription Vit D @ 50,000 IU every week #4 with 0 refills and will have routine testing of Vitamin D [name]. She was [name] of the risk of over-replacement of Vitamin D and agrees to not increase her dose unless she discusses this with [name]. Kathleen [name] agrees to follow-up with our clinic in 2 weeks.  Hyperlipidemia Kathleen [name] of the American Heart Association Guidelines emphasizing intensive lifestyle modifications as the first line treatment for hyperlipidemia. We discussed many lifestyle modifications [name] in depth, and Kathleen [name] will continue to work on decreasing saturated fats such as fatty red meat, butter and many fried foods. She will also increase vegetables and lean protein in her diet and continue to work on exercise and weight loss efforts. Kathleen [name] on her simvastatin #90 with 0 refills and agrees to follow-up with our clinic in  2 weeks.  Cardiovascular risk counseling Kathleen [name] was given extended (15 minutes) coronary artery disease prevention counseling [name]. She is 78 y.o. female and has risk factors for heart disease including obesity. We discussed intensive lifestyle  modifications [name] with an emphasis on specific weight loss instructions and strategies. Pt was also [name] of the importance of increasing exercise and decreasing saturated fats to help prevent heart disease.  Diabetes II Kathleen [name], Kathleen [name] HgA1c goals  and hypoglycemia prevention. We discussed the importance of good blood sugar control to decrease the likelihood of diabetic complications such as nephropathy, neuropathy, limb loss, blindness, coronary artery disease, and death. We discussed the importance of intensive lifestyle modification including diet, exercise and weight loss as the first line treatment for diabetes. Kathleen [name] on her metformin #30 with 0 refills and agrees to follow-up with our clinic in 2 weeks. She will have labs drawn [name].  Hypertension We discussed sodium restriction, working on healthy weight loss, and a regular exercise program as the means to achieve improved blood pressure control. Kathleen [name] with this plan and [name] to follow up as directed. We will continue to monitor her blood pressure as well as her progress with the above lifestyle modifications. Kathleen [name] on her chlorthalidone #90 with 0 refills and Valsartan #180 with 0 refills. She agrees to follow-up with our clinic in 2 weeks.   Obesity Kathleen [name] is currently in the action stage of change. As such, her goal is to continue with weight loss efforts. She has [name] to follow the Category 3 plan. Kathleen [name] has been instructed to work up to a goal of 150 minutes of combined cardio and strengthening exercise per week for weight loss and overall health benefits. We discussed the following Behavioral Modification Strategies [name]: increasing lean protein intake and work on meal planning and easy cooking plans.  Kathleen [name] has [name] to follow-up  with our clinic in 2 weeks. She was [name] of the importance of frequent follow up visits to maximize her success with intensive lifestyle modifications for her multiple health conditions.  ALLERGIES: Allergies  Allergen Reactions  . Mobic [Meloxicam] Shortness Of Breath    "Wheezing"   . Naproxen Sodium Swelling    Hands and feet  . Tape     Surgical tape adhesive and paper tape   . Doxycycline Itching and Rash    MEDICATIONS: Current Outpatient Medications on File Prior to Visit  Medication Sig Dispense [name]  . acetaminophen (TYLENOL) 500 MG tablet Take 500 mg by mouth every 6 (six) hours as needed. 1-2 tablets q 4-6 hrs            . aspirin 81 MG tablet Take 81 mg by mouth daily.    . betamethasone dipropionate (DIPROLENE) 0.05 % cream Apply topically 1 day or 1 dose.      . bisoprolol (ZEBETA) 5 MG tablet     . fexofenadine (ALLEGRA) 180 MG tablet Take 180 mg by mouth daily.     Marland Kitchen FIBER ADULT GUMMIES 2 g CHEW Chew 2 tablets by mouth daily.    . Glucosamine HCl 1500 MG TABS Take 2 tablets by mouth daily.    Marland Kitchen glucose blood test strip 1 each by Other route as needed for other. Use as instructed    . ibuprofen (ADVIL,MOTRIN) 200 MG tablet Take 200 mg by mouth every 6 (six) hours  as needed.      Javier Docker Oil 300 MG CAPS Take by mouth daily.      Marland Kitchen omeprazole (PRILOSEC) 20 MG capsule Take 20 mg by mouth daily.      Marland Kitchen omeprazole-sodium bicarbonate (ZEGERID) 40-1100 MG per capsule Take 1 capsule by mouth daily before breakfast.      . Probiotic Product (PROBIOTIC DAILY PO) Take by mouth.     No current facility-administered medications on file prior to visit.     PAST MEDICAL HISTORY: Past Medical History:  Diagnosis Date  . Arthritis   . Back pain   . Breast cancer ([place]) 2012  . Cancer ([place])    colon  . Diabetes mellitus   . Family history of breast cancer   . Family history of colon cancer   . GERD (gastroesophageal reflux disease)   . Hyperlipidemia   .  Hypertension   . Joint pain   . Kidney problem   . Lactose intolerance   . Neuromuscular disorder ([place])    back, hip, leg left side  . Obesity   . Personal history of radiation therapy 2012  . Sleep apnea   . Spinal stenosis     PAST SURGICAL HISTORY: Past Surgical History:  Procedure Laterality Date  . ABDOMINAL HYSTERECTOMY    . BREAST EXCISIONAL BIOPSY Left 1972  . BREAST LUMPECTOMY Right 2012  . BREAST SURGERY     biopsy  . COLON SURGERY     partial colectomy  . X-STOP IMPLANTATION  September 26th,2012    SOCIAL HISTORY: Social History   Tobacco Use  . Smoking status: Former Research scientist (life sciences)  . Smokeless tobacco: Never Used  Substance Use Topics  . Alcohol use: Yes  . Drug use: No    FAMILY HISTORY: Family History  Problem Relation Age of Onset  . Breast cancer Mother        dx late 47's  . Hyperlipidemia Mother   . Heart disease Mother   . Liver disease Mother   . Breast cancer Sister 2  . Heart disease Father   . Obesity Father   . Hypertension Father   . Diabetes Father   . Heart disease Maternal Grandmother   . Colon cancer Cousin        dx 41+  . Kidney cancer Son 57   ROS: Review of Systems  Constitutional: Positive for weight loss.  Cardiovascular: Negative for chest pain.  Gastrointestinal: Negative for diarrhea, nausea and vomiting.  Endo/Heme/Allergies:       Negative for polyphagia. Negative for hypoglycemia.   PHYSICAL EXAM: Blood pressure (!) 151/77, pulse (!) 54, temperature 97.7 F (36.5 C), height 5\' 4"  (1.626 m), weight 243 lb (110.2 kg), SpO2 97 %. Body mass index is 41.71 kg/m. Physical Exam Vitals signs reviewed.  Constitutional:      Appearance: Normal appearance. She is obese.  Cardiovascular:     Rate and Rhythm: Normal rate.     Pulses: Normal pulses.  Pulmonary:     Effort: Pulmonary effort is normal.     Breath sounds: Normal breath sounds.  Musculoskeletal: Normal range of motion.  Skin:    General: Skin is warm and  dry.  Neurological:     Mental Status: She is alert and oriented to person, place, and time.  Psychiatric:        Behavior: Behavior normal.   RECENT LABS AND TESTS: BMET    Component Value Date/Time   NA 142 07/22/2018 1427   NA  141 12/14/2014 0747   K 4.2 07/22/2018 1427   K 4.2 12/14/2014 0747   CL 100 07/22/2018 1427   CO2 26 07/22/2018 1427   CO2 28 12/14/2014 0747   GLUCOSE 96 07/22/2018 1427   GLUCOSE 120 12/14/2014 0747   BUN 30 (H) 07/22/2018 1427   BUN 21.3 12/14/2014 0747   CREATININE 0.84 07/22/2018 1427   CREATININE 0.9 12/14/2014 0747   CALCIUM 9.7 07/22/2018 1427   CALCIUM 9.4 12/14/2014 0747   GFRNONAA 68 07/22/2018 1427   GFRAA 78 07/22/2018 1427   Lab Results  Component Value Date   HGBA1C 6.8 (H) 04/12/2018   Lab Results  Component Value Date   INSULIN 30.6 (H) 04/12/2018   CBC    Component Value Date/Time   WBC 7.1 04/12/2018 1051   WBC 8.0 12/14/2014 0746   WBC 6.1 03/04/2011 1040   RBC 4.27 04/12/2018 1051   RBC 4.17 12/14/2014 0746   RBC 3.99 03/04/2011 1040   HGB 12.8 04/12/2018 1051   HGB 12.4 12/14/2014 0746   HCT 37.8 04/12/2018 1051   HCT 37.0 12/14/2014 0746   PLT 230 12/14/2014 0746   MCV 89 04/12/2018 1051   MCV 88.9 12/14/2014 0746   MCH 30.0 04/12/2018 1051   MCH 29.8 12/14/2014 0746   MCH 30.1 03/04/2011 1040   MCHC 33.9 04/12/2018 1051   MCHC 33.5 12/14/2014 0746   MCHC 33.1 03/04/2011 1040   RDW 12.4 04/12/2018 1051   RDW 12.5 12/14/2014 0746   LYMPHSABS 2.0 04/12/2018 1051   LYMPHSABS 1.8 12/14/2014 0746   MONOABS 0.5 12/14/2014 0746   EOSABS 0.2 04/12/2018 1051   BASOSABS 0.0 04/12/2018 1051   BASOSABS 0.1 12/14/2014 0746   Iron/TIBC/Ferritin/ %Sat No results found for: IRON, TIBC, FERRITIN, IRONPCTSAT Lipid Panel     Component Value Date/Time   CHOL 155 04/12/2018 1051   TRIG 123 04/12/2018 1051   HDL 60 04/12/2018 1051   LDLCALC 70 04/12/2018 1051   Hepatic Function Panel     Component Value  Date/Time   PROT 7.2 07/22/2018 1427   PROT 7.1 12/14/2014 0747   ALBUMIN 4.4 07/22/2018 1427   ALBUMIN 3.8 12/14/2014 0747   AST 18 07/22/2018 1427   AST 16 12/14/2014 0747   ALT 18 07/22/2018 1427   ALT 15 12/14/2014 0747   ALKPHOS 72 07/22/2018 1427   ALKPHOS 52 12/14/2014 0747   BILITOT 0.3 07/22/2018 1427   BILITOT 0.40 12/14/2014 0747      Component Value Date/Time   TSH 1.480 04/12/2018 1051   Results for ROBYN, GALATI (MRN [id number]) as of 08/24/2018 14:40  Ref. Range 04/12/2018 10:51  Vitamin D, 25-Hydroxy Latest Ref Range: 30.0 - 100.0 ng/mL 25.2 (L)   OBESITY BEHAVIORAL INTERVENTION VISIT  [name]'s visit was #9  Starting weight: 259 lbs Starting date: 04/12/2018 [name]'s weight: 243 lbs [name]'s date: 08/24/2018 Total lbs lost to date: 16   08/24/2018  Height 5\' 4"  (1.626 m)  Weight 243 lb (110.2 kg)  BMI (Calculated) 41.69  BLOOD PRESSURE - SYSTOLIC 546  BLOOD PRESSURE - DIASTOLIC 77   Body Fat % 27.0 %  Total Body Water (lbs) 84.4 lbs   ASK: We discussed the diagnosis of obesity with Kathleen [name] and Kathleen [name] to give [name] permission to discuss obesity behavioral modification therapy [name].  ASSESS: Kathleen [name] has the diagnosis of obesity and her BMI [name] is 41.69. Kathleen [name] is in the action stage of change.  ADVISE: Kathleen [name] was educated  on the multiple health risks of obesity as well as the benefit of weight loss to improve her health. She was advised of the need for long term treatment and the importance of lifestyle modifications to improve her current health and to decrease her risk of future health problems.  AGREE: Multiple dietary modification options and treatment options were discussed and  Kathleen [name] to follow the recommendations documented in the above note.  ARRANGE: Kathleen [name] was educated on the importance of frequent visits to treat obesity as outlined per CMS and USPSTF guidelines and [name] to schedule her next follow  up appointment [name].  Migdalia Dk, am acting as transcriptionist for Abby Potash, PA-C I, Abby Potash, PA-C have reviewed above note and agree with its content

## 2018-08-25 LAB — COMPREHENSIVE METABOLIC PANEL
A/G RATIO: 1.3 (ref 1.2–2.2)
ALT: 16 IU/L (ref 0–32)
AST: 14 IU/L (ref 0–40)
Albumin: 4.1 g/dL (ref 3.7–4.7)
Alkaline Phosphatase: 70 IU/L (ref 39–117)
BILIRUBIN TOTAL: 0.3 mg/dL (ref 0.0–1.2)
BUN/Creatinine Ratio: 34 — ABNORMAL HIGH (ref 12–28)
BUN: 30 mg/dL — ABNORMAL HIGH (ref 8–27)
CHLORIDE: 100 mmol/L (ref 96–106)
CO2: 24 mmol/L (ref 20–29)
Calcium: 9.5 mg/dL (ref 8.7–10.3)
Creatinine, Ser: 0.87 mg/dL (ref 0.57–1.00)
GFR calc Af Amer: 75 mL/min/{1.73_m2} (ref >59)
GFR calc non Af Amer: 65 mL/min/{1.73_m2} (ref >59)
Globulin, Total: 3.1 g/dL (ref 1.5–4.5)
Glucose: 124 mg/dL — ABNORMAL HIGH (ref 65–99)
POTASSIUM: 4.4 mmol/L (ref 3.5–5.2)
Sodium: 141 mmol/L (ref 134–144)
Total Protein: 7.2 g/dL (ref 6.0–8.5)

## 2018-08-25 LAB — HEMOGLOBIN A1C
Est. average glucose Bld gHb Est-mCnc: 131 mg/dL
Hgb A1c MFr Bld: 6.2 % — ABNORMAL HIGH (ref 4.8–5.6)

## 2018-08-25 LAB — VITAMIN D 25 HYDROXY (VIT D DEFICIENCY, FRACTURES): VIT D 25 HYDROXY: 46.3 ng/mL (ref 30.0–100.0)

## 2018-08-25 LAB — INSULIN, RANDOM: INSULIN: 24.9 u[IU]/mL (ref 2.6–24.9)

## 2018-09-07 ENCOUNTER — Encounter (INDEPENDENT_AMBULATORY_CARE_PROVIDER_SITE_OTHER): Payer: Self-pay

## 2018-09-09 ENCOUNTER — Encounter (INDEPENDENT_AMBULATORY_CARE_PROVIDER_SITE_OTHER): Payer: Self-pay | Admitting: Physician Assistant

## 2018-09-09 ENCOUNTER — Other Ambulatory Visit: Payer: Self-pay

## 2018-09-09 ENCOUNTER — Ambulatory Visit (INDEPENDENT_AMBULATORY_CARE_PROVIDER_SITE_OTHER): Payer: Medicare Other | Admitting: Physician Assistant

## 2018-09-09 DIAGNOSIS — Z6841 Body Mass Index (BMI) 40.0 and over, adult: Secondary | ICD-10-CM | POA: Diagnosis not present

## 2018-09-09 DIAGNOSIS — E119 Type 2 diabetes mellitus without complications: Secondary | ICD-10-CM

## 2018-09-09 DIAGNOSIS — E66813 Obesity, class 3: Secondary | ICD-10-CM

## 2018-09-09 MED ORDER — METFORMIN HCL 500 MG PO TABS: 500.0000 mg | ORAL_TABLET | Freq: Every day | ORAL | 0 refills | Status: DC

## 2018-09-13 NOTE — Progress Notes (Addendum)
Office: [phone]  /  Fax: [phone] TeleHealth Visit:  [name] has consented to this TeleHealth visit today via [place]. The patient is located at home, the provider is located at the [facility] office. The participants in this visit include the listed provider and patient and any and all partied involved.  HPI:   Chief Complaint: OBESITY Kathleen [name] is here to discuss her progress with her obesity treatment plan. She is on the Category 3 plan and is following her eating plan approximately 75 % of the time. She states she is exercising 0 minutes 0 times per week. Kathleen [name] reports that she believes she has maintained her weight. She is not getting in enough protein at dinner. We were unable to weight the patient today for this TeleHealth visit.She feels as if she has maintained weight since her last visit. She has lost 16 lbs since starting treatment with [name].  Diabetes II Kathleen [name] has a diagnosis of diabetes type II. Kathleen [name] states fasting BGs range between 132 and 147 . Last A1c was at 6.2 She has been working on intensive lifestyle modifications including diet, exercise, and weight loss to help control her blood glucose levels. Kathleen [name] is on metformin and she denies nausea, vomiting or diarrhea.  ASSESSMENT AND PLAN:  Type 2 diabetes mellitus without complication, without long-term current use of insulin ([place]) - Plan: metFORMIN (GLUCOPHAGE) 500 MG tablet  Class 3 severe obesity with serious comorbidity and body mass index (BMI) of 40.0 to 44.9 in adult, unspecified obesity type ([place])  PLAN:  Diabetes II Kathleen [name] has been given extensive diabetes education by myself today including ideal fasting and post-prandial blood glucose readings, individual ideal Hgb A1c goals and hypoglycemia prevention. We discussed the importance of good blood sugar control to decrease the likelihood of diabetic complications such as nephropathy, neuropathy, limb loss, blindness,  coronary artery disease, and death. We discussed the importance of intensive lifestyle modification including diet, exercise and weight loss as the first line treatment for diabetes. Kathleen [name] agrees to continue metformin 500 mg once daily #30 with no refills and follow up at the agreed upon time.  I spent > than 50% of the 25 minute visit on counseling as documented in the note.  Obesity Kathleen [name] is currently in the action stage of change. As such, her goal is to continue with weight loss efforts She has agreed to follow the Category 3 [place] has been instructed to work up to a goal of 150 minutes of combined cardio and strengthening exercise per week for weight loss and overall health benefits. We discussed the following Behavioral Modification Strategies today: increasing lean protein intake and work on meal planning and easy cooking plans  Kathleen [name] has agreed to follow up with our clinic in 2 weeks. She was informed of the importance of frequent follow up visits to maximize her success with intensive lifestyle modifications for her multiple health conditions.  I spent > than 50% of the 25 minute visit on counseling as documented in the note.    ALLERGIES: Allergies  Allergen Reactions  . Mobic [Meloxicam] Shortness Of Breath    "Wheezing"   . Naproxen Sodium Swelling    Hands and feet  . Tape     Surgical tape adhesive and paper tape   . Doxycycline Itching and Rash    MEDICATIONS: Current Outpatient Medications on File Prior to Visit  Medication Sig Dispense Refill  . acetaminophen (TYLENOL) 500 MG tablet Take 500 mg by mouth every  6 (six) hours as needed. 1-2 tablets q 4-6 hrs            . aspirin 81 MG tablet Take 81 mg by mouth daily.    . betamethasone dipropionate (DIPROLENE) 0.05 % cream Apply topically 1 day or 1 dose.      . bisoprolol (ZEBETA) 5 MG tablet     . chlorthalidone (HYGROTON) 50 MG tablet Take 1 tablet (50 mg total) by mouth daily. 90 tablet  0  . fexofenadine (ALLEGRA) 180 MG tablet Take 180 mg by mouth daily.     Marland Kitchen FIBER ADULT GUMMIES 2 g CHEW Chew 2 tablets by mouth daily.    . Glucosamine HCl 1500 MG TABS Take 2 tablets by mouth daily.    Marland Kitchen glucose blood test strip 1 each by Other route as needed for other. Use as instructed    . ibuprofen (ADVIL,MOTRIN) 200 MG tablet Take 200 mg by mouth every 6 (six) hours as needed.      Javier Docker Oil 300 MG CAPS Take by mouth daily.      Marland Kitchen omeprazole (PRILOSEC) 20 MG capsule Take 20 mg by mouth daily.      Marland Kitchen omeprazole-sodium bicarbonate (ZEGERID) 40-1100 MG per capsule Take 1 capsule by mouth daily before breakfast.      . Probiotic Product (PROBIOTIC DAILY PO) Take by mouth.    . simvastatin (ZOCOR) 40 MG tablet Take 1 tablet (40 mg total) by mouth at bedtime. 90 tablet 0  . valsartan (DIOVAN) 80 MG tablet Take 1 tablet (80 mg total) by mouth 2 (two) times daily. 180 tablet 0  . Vitamin D, Ergocalciferol, (DRISDOL) 1.25 MG (50000 UT) CAPS capsule Take 1 capsule (50,000 Units total) by mouth every 7 (seven) days. 4 capsule 0   No current facility-administered medications on file prior to visit.     PAST MEDICAL HISTORY: Past Medical History:  Diagnosis Date  . Arthritis   . Back pain   . Breast cancer ([place]) 2012  . Cancer ([place])    colon  . Diabetes mellitus   . Family history of breast cancer   . Family history of colon cancer   . GERD (gastroesophageal reflux disease)   . Hyperlipidemia   . Hypertension   . Joint pain   . Kidney problem   . Lactose intolerance   . Neuromuscular disorder ([place])    back, hip, leg left side  . Obesity   . Personal history of radiation therapy 2012  . Sleep apnea   . Spinal stenosis     PAST SURGICAL HISTORY: Past Surgical History:  Procedure Laterality Date  . ABDOMINAL HYSTERECTOMY    . BREAST EXCISIONAL BIOPSY Left 1972  . BREAST LUMPECTOMY Right 2012  . BREAST SURGERY     biopsy  . COLON SURGERY     partial colectomy  . X-STOP  IMPLANTATION  September 26th,2012    SOCIAL HISTORY: Social History   Tobacco Use  . Smoking status: Former Research scientist (life sciences)  . Smokeless tobacco: Never Used  Substance Use Topics  . Alcohol use: Yes  . Drug use: No    FAMILY HISTORY: Family History  Problem Relation Age of Onset  . Breast cancer Mother        dx late 69's  . Hyperlipidemia Mother   . Heart disease Mother   . Liver disease Mother   . Breast cancer Sister 65  . Heart disease Father   . Obesity Father   . Hypertension  Father   . Diabetes Father   . Heart disease Maternal Grandmother   . Colon cancer Cousin        dx 46+  . Kidney cancer Son 44    ROS: Review of Systems  Constitutional: Negative for weight loss.  Gastrointestinal: Negative for diarrhea, nausea and vomiting.    PHYSICAL EXAM: Pt in no acute distress  RECENT LABS AND TESTS: BMET    Component Value Date/Time   NA 141 08/24/2018 1221   NA 141 12/14/2014 0747   K 4.4 08/24/2018 1221   K 4.2 12/14/2014 0747   CL 100 08/24/2018 1221   CO2 24 08/24/2018 1221   CO2 28 12/14/2014 0747   GLUCOSE 124 (H) 08/24/2018 1221   GLUCOSE 120 12/14/2014 0747   BUN 30 (H) 08/24/2018 1221   BUN 21.3 12/14/2014 0747   CREATININE 0.87 08/24/2018 1221   CREATININE 0.9 12/14/2014 0747   CALCIUM 9.5 08/24/2018 1221   CALCIUM 9.4 12/14/2014 0747   GFRNONAA 65 08/24/2018 1221   GFRAA 75 08/24/2018 1221   Lab Results  Component Value Date   HGBA1C 6.2 (H) 08/24/2018   HGBA1C 6.8 (H) 04/12/2018   Lab Results  Component Value Date   INSULIN 24.9 08/24/2018   INSULIN 30.6 (H) 04/12/2018   CBC    Component Value Date/Time   WBC 7.1 04/12/2018 1051   WBC 8.0 12/14/2014 0746   WBC 6.1 03/04/2011 1040   RBC 4.27 04/12/2018 1051   RBC 4.17 12/14/2014 0746   RBC 3.99 03/04/2011 1040   HGB 12.8 04/12/2018 1051   HGB 12.4 12/14/2014 0746   HCT 37.8 04/12/2018 1051   HCT 37.0 12/14/2014 0746   PLT 230 12/14/2014 0746   MCV 89 04/12/2018 1051   MCV  88.9 12/14/2014 0746   MCH 30.0 04/12/2018 1051   MCH 29.8 12/14/2014 0746   MCH 30.1 03/04/2011 1040   MCHC 33.9 04/12/2018 1051   MCHC 33.5 12/14/2014 0746   MCHC 33.1 03/04/2011 1040   RDW 12.4 04/12/2018 1051   RDW 12.5 12/14/2014 0746   LYMPHSABS 2.0 04/12/2018 1051   LYMPHSABS 1.8 12/14/2014 0746   MONOABS 0.5 12/14/2014 0746   EOSABS 0.2 04/12/2018 1051   BASOSABS 0.0 04/12/2018 1051   BASOSABS 0.1 12/14/2014 0746   Iron/TIBC/Ferritin/ %Sat No results found for: IRON, TIBC, FERRITIN, IRONPCTSAT Lipid Panel     Component Value Date/Time   CHOL 155 04/12/2018 1051   TRIG 123 04/12/2018 1051   HDL 60 04/12/2018 1051   LDLCALC 70 04/12/2018 1051   Hepatic Function Panel     Component Value Date/Time   PROT 7.2 08/24/2018 1221   PROT 7.1 12/14/2014 0747   ALBUMIN 4.1 08/24/2018 1221   ALBUMIN 3.8 12/14/2014 0747   AST 14 08/24/2018 1221   AST 16 12/14/2014 0747   ALT 16 08/24/2018 1221   ALT 15 12/14/2014 0747   ALKPHOS 70 08/24/2018 1221   ALKPHOS 52 12/14/2014 0747   BILITOT 0.3 08/24/2018 1221   BILITOT 0.40 12/14/2014 0747      Component Value Date/Time   TSH 1.480 04/12/2018 1051     Ref. Range 08/24/2018 12:21  Vitamin D, 25-Hydroxy Latest Ref Range: 30.0 - 100.0 ng/mL 46.3     I, Doreene Nest, am acting as Location manager for Abby Potash, PA-C I, Abby Potash, PA-C have reviewed above note and agree with its content

## 2018-09-22 ENCOUNTER — Ambulatory Visit (INDEPENDENT_AMBULATORY_CARE_PROVIDER_SITE_OTHER): Payer: Medicare Other | Admitting: Physician Assistant

## 2018-09-22 ENCOUNTER — Encounter (INDEPENDENT_AMBULATORY_CARE_PROVIDER_SITE_OTHER): Payer: Self-pay | Admitting: Physician Assistant

## 2018-09-22 ENCOUNTER — Other Ambulatory Visit: Payer: Self-pay

## 2018-09-22 DIAGNOSIS — E119 Type 2 diabetes mellitus without complications: Secondary | ICD-10-CM

## 2018-09-22 DIAGNOSIS — Z6841 Body Mass Index (BMI) 40.0 and over, adult: Secondary | ICD-10-CM

## 2018-09-22 DIAGNOSIS — E559 Vitamin D deficiency, unspecified: Secondary | ICD-10-CM

## 2018-09-22 MED ORDER — VITAMIN D (ERGOCALCIFEROL) 1.25 MG (50000 UNIT) PO CAPS: 50000.0000 [IU] | ORAL_CAPSULE | ORAL | 0 refills | Status: DC

## 2018-09-22 MED ORDER — METFORMIN HCL 500 MG PO TABS: 500.0000 mg | ORAL_TABLET | Freq: Every day | ORAL | 0 refills | Status: DC

## 2018-09-23 NOTE — Progress Notes (Signed)
Office: [phone]  /  Fax: [phone] TeleHealth Visit:  Kathleen [name] has verbally consented to this TeleHealth visit today. The patient is located at home, the provider is located at the [facility] office. The participants in this visit include the listed provider and patient. The visit was conducted today via FaceTime.  HPI:   Chief Complaint: OBESITY Kathleen [name] is here to discuss her progress with her obesity treatment plan. She is on the Category 3 plan and is following her eating plan approximately 75% of the time. She states she is exercising 0 minutes 0 times per week. Kathleen [name] reports that she has been eating cottage cheese and yogurt to make up for the protein she is not getting at dinner. She is doing well overall. We were unable to weigh the patient today for this TeleHealth visit. She feels as if she has lost 1 lb since her last visit. She has lost 16 lbs since starting treatment with Kathleen [name].  Vitamin D deficiency Kathleen [name] has a diagnosis of Vitamin D deficiency. She is currently taking prescription Vit D and denies nausea, vomiting or muscle weakness.  Diabetes II Kathleen [name] has a diagnosis of diabetes type II and is on metformin. Kathleen [name] states fasting blood sugars are averaging in the 130's. Last A1c was reported to be 6.2 on 08/24/2018. She has been working on intensive lifestyle modifications including diet, exercise, and weight loss to help control her blood glucose levels. She denies nausea, vomiting, or diarrhea. No polyphagia.  ASSESSMENT AND PLAN:  Vitamin D deficiency - Plan: Vitamin D, Ergocalciferol, (DRISDOL) 1.25 MG (50000 UT) CAPS capsule  Type 2 diabetes mellitus without complication, without long-term current use of insulin (HCC) - Plan: metFORMIN (GLUCOPHAGE) 500 MG tablet  Class 3 severe obesity with serious comorbidity and body mass index (BMI) of 40.0 to 44.9 in adult, unspecified obesity type ([name])  PLAN:  Vitamin D Deficiency  Kathleen [name] was informed that low Vitamin D levels contributes to fatigue and are associated with obesity, breast, and colon cancer. She agrees to continue to take prescription Vit D @ 50,000 IU every week #4 with 0 refills and will follow-up for routine testing of Vitamin D, at least 2-3 times per year. She was informed of the risk of over-replacement of Vitamin D and agrees to not increase her dose unless she discusses this with Kathleen [name] first. Kathleen [name] agrees to follow-up with our clinic in 3 weeks.  Diabetes II Kathleen [name] has been given extensive diabetes education by myself today including ideal fasting and post-prandial blood glucose readings, individual ideal HgA1c goals  and hypoglycemia prevention. We discussed the importance of good blood sugar control to decrease the likelihood of diabetic complications such as nephropathy, neuropathy, limb loss, blindness, coronary artery disease, and death. We discussed the importance of intensive lifestyle modification including diet, exercise and weight loss as the first line treatment for diabetes. Kathleen [name] was given a refill on her metformin #30 with 0 refills. She agrees to follow-up with our clinic in 3 weeks.  Obesity Kathleen [name] is currently in the action stage of change. As such, her goal is to continue with weight loss efforts. She has agreed to follow the Category 3 plan. Kathleen [name] has been instructed to work up to a goal of 150 minutes of combined cardio and strengthening exercise per week for weight loss and overall health benefits. We discussed the following Behavioral Modification Strategies today: work on meal planning, easy cooking plans, and ways to avoid boredom eating.  Kathleen [name] has agreed  to follow-up with our clinic in 3 weeks. She was informed of the importance of frequent follow-up visits to maximize her success with intensive lifestyle modifications for her multiple health conditions.  ALLERGIES: Allergies  Allergen Reactions  . Mobic  [Meloxicam] Shortness Of Breath    "Wheezing"   . Naproxen Sodium Swelling    Hands and feet  . Tape     Surgical tape adhesive and paper tape   . Doxycycline Itching and Rash    MEDICATIONS: Current Outpatient Medications on File Prior to Visit  Medication Sig Dispense Refill  . acetaminophen (TYLENOL) 500 MG tablet Take 500 mg by mouth every 6 (six) hours as needed. 1-2 tablets q 4-6 hrs            . aspirin 81 MG tablet Take 81 mg by mouth daily.    . betamethasone dipropionate (DIPROLENE) 0.05 % cream Apply topically 1 day or 1 dose.      . bisoprolol (ZEBETA) 5 MG tablet     . chlorthalidone (HYGROTON) 50 MG tablet Take 1 tablet (50 mg total) by mouth daily. 90 tablet 0  . fexofenadine (ALLEGRA) 180 MG tablet Take 180 mg by mouth daily.     Marland Kitchen FIBER ADULT GUMMIES 2 g CHEW Chew 2 tablets by mouth daily.    . Glucosamine HCl 1500 MG TABS Take 2 tablets by mouth daily.    Marland Kitchen glucose blood test strip 1 each by Other route as needed for other. Use as instructed    . ibuprofen (ADVIL,MOTRIN) 200 MG tablet Take 200 mg by mouth every 6 (six) hours as needed.      Javier Docker Oil 300 MG CAPS Take by mouth daily.      Marland Kitchen omeprazole (PRILOSEC) 20 MG capsule Take 20 mg by mouth daily.      Marland Kitchen omeprazole-sodium bicarbonate (ZEGERID) 40-1100 MG per capsule Take 1 capsule by mouth daily before breakfast.      . Probiotic Product (PROBIOTIC DAILY PO) Take by mouth.    . simvastatin (ZOCOR) 40 MG tablet Take 1 tablet (40 mg total) by mouth at bedtime. 90 tablet 0  . valsartan (DIOVAN) 80 MG tablet Take 1 tablet (80 mg total) by mouth 2 (two) times daily. 180 tablet 0   No current facility-administered medications on file prior to visit.     PAST MEDICAL HISTORY: Past Medical History:  Diagnosis Date  . Arthritis   . Back pain   . Breast cancer ([place]) 2012  . Cancer ([place])    colon  . Diabetes mellitus   . Family history of breast cancer   . Family history of colon cancer   . GERD  (gastroesophageal reflux disease)   . Hyperlipidemia   . Hypertension   . Joint pain   . Kidney problem   . Lactose intolerance   . Neuromuscular disorder ([place])    back, hip, leg left side  . Obesity   . Personal history of radiation therapy 2012  . Sleep apnea   . Spinal stenosis     PAST SURGICAL HISTORY: Past Surgical History:  Procedure Laterality Date  . ABDOMINAL HYSTERECTOMY    . BREAST EXCISIONAL BIOPSY Left 1972  . BREAST LUMPECTOMY Right 2012  . BREAST SURGERY     biopsy  . COLON SURGERY     partial colectomy  . X-STOP IMPLANTATION  September 26th,2012    SOCIAL HISTORY: Social History   Tobacco Use  . Smoking status: Former Research scientist (life sciences)  .  Smokeless tobacco: Never Used  Substance Use Topics  . Alcohol use: Yes  . Drug use: No    FAMILY HISTORY: Family History  Problem Relation Age of Onset  . Breast cancer Mother        dx late 22's  . Hyperlipidemia Mother   . Heart disease Mother   . Liver disease Mother   . Breast cancer Sister 40  . Heart disease Father   . Obesity Father   . Hypertension Father   . Diabetes Father   . Heart disease Maternal Grandmother   . Colon cancer Cousin        dx 71+  . Kidney cancer Son 64   ROS: Review of Systems  Gastrointestinal: Negative for diarrhea, nausea and vomiting.  Musculoskeletal:       Negative for muscle weakness.  Endo/Heme/Allergies:       Negative for polyphagia.   PHYSICAL EXAM: Pt in no acute distress  RECENT LABS AND TESTS: BMET    Component Value Date/Time   NA 141 08/24/2018 1221   NA 141 12/14/2014 0747   K 4.4 08/24/2018 1221   K 4.2 12/14/2014 0747   CL 100 08/24/2018 1221   CO2 24 08/24/2018 1221   CO2 28 12/14/2014 0747   GLUCOSE 124 (H) 08/24/2018 1221   GLUCOSE 120 12/14/2014 0747   BUN 30 (H) 08/24/2018 1221   BUN 21.3 12/14/2014 0747   CREATININE 0.87 08/24/2018 1221   CREATININE 0.9 12/14/2014 0747   CALCIUM 9.5 08/24/2018 1221   CALCIUM 9.4 12/14/2014 0747    GFRNONAA 65 08/24/2018 1221   GFRAA 75 08/24/2018 1221   Lab Results  Component Value Date   HGBA1C 6.2 (H) 08/24/2018   HGBA1C 6.8 (H) 04/12/2018   Lab Results  Component Value Date   INSULIN 24.9 08/24/2018   INSULIN 30.6 (H) 04/12/2018   CBC    Component Value Date/Time   WBC 7.1 04/12/2018 1051   WBC 8.0 12/14/2014 0746   WBC 6.1 03/04/2011 1040   RBC 4.27 04/12/2018 1051   RBC 4.17 12/14/2014 0746   RBC 3.99 03/04/2011 1040   HGB 12.8 04/12/2018 1051   HGB 12.4 12/14/2014 0746   HCT 37.8 04/12/2018 1051   HCT 37.0 12/14/2014 0746   PLT 230 12/14/2014 0746   MCV 89 04/12/2018 1051   MCV 88.9 12/14/2014 0746   MCH 30.0 04/12/2018 1051   MCH 29.8 12/14/2014 0746   MCH 30.1 03/04/2011 1040   MCHC 33.9 04/12/2018 1051   MCHC 33.5 12/14/2014 0746   MCHC 33.1 03/04/2011 1040   RDW 12.4 04/12/2018 1051   RDW 12.5 12/14/2014 0746   LYMPHSABS 2.0 04/12/2018 1051   LYMPHSABS 1.8 12/14/2014 0746   MONOABS 0.5 12/14/2014 0746   EOSABS 0.2 04/12/2018 1051   BASOSABS 0.0 04/12/2018 1051   BASOSABS 0.1 12/14/2014 0746   Iron/TIBC/Ferritin/ %Sat No results found for: IRON, TIBC, FERRITIN, IRONPCTSAT Lipid Panel     Component Value Date/Time   CHOL 155 04/12/2018 1051   TRIG 123 04/12/2018 1051   HDL 60 04/12/2018 1051   LDLCALC 70 04/12/2018 1051   Hepatic Function Panel     Component Value Date/Time   PROT 7.2 08/24/2018 1221   PROT 7.1 12/14/2014 0747   ALBUMIN 4.1 08/24/2018 1221   ALBUMIN 3.8 12/14/2014 0747   AST 14 08/24/2018 1221   AST 16 12/14/2014 0747   ALT 16 08/24/2018 1221   ALT 15 12/14/2014 0747   ALKPHOS 70 08/24/2018  1221   ALKPHOS 52 12/14/2014 0747   BILITOT 0.3 08/24/2018 1221   BILITOT 0.40 12/14/2014 0747      Component Value Date/Time   TSH 1.480 04/12/2018 1051   Results for CLINT, STRUPP (MRN [id number]) as of 09/23/2018 07:13  Ref. Range 08/24/2018 12:21  Vitamin D, 25-Hydroxy Latest Ref Range: 30.0 - 100.0 ng/mL 46.3    I,  Michaelene Song, am acting as Location manager for [employer], PA-C I, Abby Potash, PA-C have reviewed above note and agree with its content

## 2018-10-04 ENCOUNTER — Other Ambulatory Visit (INDEPENDENT_AMBULATORY_CARE_PROVIDER_SITE_OTHER): Payer: Self-pay | Admitting: Physician Assistant

## 2018-10-04 DIAGNOSIS — E119 Type 2 diabetes mellitus without complications: Secondary | ICD-10-CM

## 2018-10-05 ENCOUNTER — Other Ambulatory Visit (INDEPENDENT_AMBULATORY_CARE_PROVIDER_SITE_OTHER): Payer: Self-pay | Admitting: Physician Assistant

## 2018-10-05 DIAGNOSIS — E119 Type 2 diabetes mellitus without complications: Secondary | ICD-10-CM

## 2018-10-11 ENCOUNTER — Other Ambulatory Visit: Payer: Self-pay

## 2018-10-11 ENCOUNTER — Ambulatory Visit (INDEPENDENT_AMBULATORY_CARE_PROVIDER_SITE_OTHER): Payer: Medicare Other | Admitting: Physician Assistant

## 2018-10-11 ENCOUNTER — Encounter (INDEPENDENT_AMBULATORY_CARE_PROVIDER_SITE_OTHER): Payer: Self-pay | Admitting: Physician Assistant

## 2018-10-11 DIAGNOSIS — Z6841 Body Mass Index (BMI) 40.0 and over, adult: Secondary | ICD-10-CM

## 2018-10-11 DIAGNOSIS — E119 Type 2 diabetes mellitus without complications: Secondary | ICD-10-CM | POA: Diagnosis not present

## 2018-10-12 NOTE — Progress Notes (Signed)
Office: [phone]  /  Fax: [phone] TeleHealth Visit:  Kathleen [name] has verbally consented to this TeleHealth visit today. The patient is located at home, the provider is located at the [facility] office. The participants in this visit include the listed provider and patient and any and all parties involved. The visit was conducted today via FaceTime.  HPI:   Chief Complaint: OBESITY Kathleen [name] is here to discuss her progress with her obesity treatment plan. She is on the Category 3 plan and is following her eating plan approximately 80 % of the time. She states she is exercising 0 minutes 0 times per week. [name] reports that her weight today is 241.4 pounds. She continues to eat cottage cheese and yogurt to supplement the protein that she is not getting at dinner. We were unable to weigh the patient today for this TeleHealth visit. She feels as if she has lost weight since her last visit. She has lost 18 lbs since starting treatment with [name].  Diabetes II [name] has a diagnosis of diabetes type II. [name] states fasting BGs range between 126 and 153. She is on metformin and she denies nausea, vomiting or diarrhea. Last A1c was at 6.2 She has been working on intensive lifestyle modifications including diet, exercise, and weight loss to help control her blood glucose levels. [name] denies polyphagia.  ASSESSMENT AND PLAN:  Type 2 diabetes mellitus without complication, without long-term current use of insulin (HCC)  Class 3 severe obesity with serious comorbidity and body mass index (BMI) of 40.0 to 44.9 in adult, unspecified obesity type ([place])  PLAN:  Diabetes II [name] has been given extensive diabetes education by myself today including ideal fasting and post-prandial blood glucose readings, individual ideal Hgb A1c goals and hypoglycemia prevention. We discussed the importance of good blood sugar control to decrease the likelihood of diabetic  complications such as nephropathy, neuropathy, limb loss, blindness, coronary artery disease, and death. We discussed the importance of intensive lifestyle modification including diet, exercise and weight loss as the first line treatment for diabetes. [name] agrees to continue with medications and weight loss and she will follow up at the agreed upon time.  Obesity [name] is currently in the action stage of change. As such, her goal is to continue with weight loss efforts She has agreed to follow the Category 3 [name] has been instructed to work up to a goal of 150 minutes of combined cardio and strengthening exercise per week for weight loss and overall health benefits. We discussed the following Behavioral Modification Strategies today: keeping healthy foods in the home and work on meal planning and easy [name] has agreed to follow up with our clinic in 2 weeks. She was informed of the importance of frequent follow up visits to maximize her success with intensive lifestyle modifications for her multiple health conditions.  ALLERGIES: Allergies  Allergen Reactions  . Mobic [Meloxicam] Shortness Of Breath    "Wheezing"   . Naproxen Sodium Swelling    Hands and feet  . Tape     Surgical tape adhesive and paper tape   . Doxycycline Itching and Rash    MEDICATIONS: Current Outpatient Medications on File Prior to Visit  Medication Sig Dispense Refill  . acetaminophen (TYLENOL) 500 MG tablet Take 500 mg by mouth every 6 (six) hours as needed. 1-2 tablets q 4-6 hrs            . aspirin 81 MG tablet Take  81 mg by mouth daily.    . betamethasone dipropionate (DIPROLENE) 0.05 % cream Apply topically 1 day or 1 dose.      . bisoprolol (ZEBETA) 5 MG tablet     . chlorthalidone (HYGROTON) 50 MG tablet Take 1 tablet (50 mg total) by mouth daily. 90 tablet 0  . fexofenadine (ALLEGRA) 180 MG tablet Take 180 mg by mouth daily.     Marland Kitchen FIBER ADULT GUMMIES 2 g CHEW  Chew 2 tablets by mouth daily.    . Glucosamine HCl 1500 MG TABS Take 2 tablets by mouth daily.    Marland Kitchen glucose blood test strip 1 each by Other route as needed for other. Use as instructed    . ibuprofen (ADVIL,MOTRIN) 200 MG tablet Take 200 mg by mouth every 6 (six) hours as needed.      Javier Docker Oil 300 MG CAPS Take by mouth daily.      . metFORMIN (GLUCOPHAGE) 500 MG tablet Take 1 tablet (500 mg total) by mouth daily with breakfast. 30 tablet 0  . omeprazole (PRILOSEC) 20 MG capsule Take 20 mg by mouth daily.      Marland Kitchen omeprazole-sodium bicarbonate (ZEGERID) 40-1100 MG per capsule Take 1 capsule by mouth daily before breakfast.      . Probiotic Product (PROBIOTIC DAILY PO) Take by mouth.    . simvastatin (ZOCOR) 40 MG tablet Take 1 tablet (40 mg total) by mouth at bedtime. 90 tablet 0  . valsartan (DIOVAN) 80 MG tablet Take 1 tablet (80 mg total) by mouth 2 (two) times daily. 180 tablet 0  . Vitamin D, Ergocalciferol, (DRISDOL) 1.25 MG (50000 UT) CAPS capsule Take 1 capsule (50,000 Units total) by mouth every 7 (seven) days. 4 capsule 0   No current facility-administered medications on file prior to visit.     PAST MEDICAL HISTORY: Past Medical History:  Diagnosis Date  . Arthritis   . Back pain   . Breast cancer ([place]) 2012  . Cancer ([place])    colon  . Diabetes mellitus   . Family history of breast cancer   . Family history of colon cancer   . GERD (gastroesophageal reflux disease)   . Hyperlipidemia   . Hypertension   . Joint pain   . Kidney problem   . Lactose intolerance   . Neuromuscular disorder ([place])    back, hip, leg left side  . Obesity   . Personal history of radiation therapy 2012  . Sleep apnea   . Spinal stenosis     PAST SURGICAL HISTORY: Past Surgical History:  Procedure Laterality Date  . ABDOMINAL HYSTERECTOMY    . BREAST EXCISIONAL BIOPSY Left 1972  . BREAST LUMPECTOMY Right 2012  . BREAST SURGERY     biopsy  . COLON SURGERY     partial colectomy  .  X-STOP IMPLANTATION  September 26th,2012    SOCIAL HISTORY: Social History   Tobacco Use  . Smoking status: Former Research scientist (life sciences)  . Smokeless tobacco: Never Used  Substance Use Topics  . Alcohol use: Yes  . Drug use: No    FAMILY HISTORY: Family History  Problem Relation Age of Onset  . Breast cancer Mother        dx late 47's  . Hyperlipidemia Mother   . Heart disease Mother   . Liver disease Mother   . Breast cancer Sister 20  . Heart disease Father   . Obesity Father   . Hypertension Father   . Diabetes Father   .  Heart disease Maternal Grandmother   . Colon cancer Cousin        dx 31+  . Kidney cancer Son 72    ROS: Review of Systems  Constitutional: Positive for weight loss.  Gastrointestinal: Negative for diarrhea, nausea and vomiting.  Endo/Heme/Allergies:       Negative for polyphagia    PHYSICAL EXAM: Pt in no acute distress  RECENT LABS AND TESTS: BMET    Component Value Date/Time   NA 141 08/24/2018 1221   NA 141 12/14/2014 0747   K 4.4 08/24/2018 1221   K 4.2 12/14/2014 0747   CL 100 08/24/2018 1221   CO2 24 08/24/2018 1221   CO2 28 12/14/2014 0747   GLUCOSE 124 (H) 08/24/2018 1221   GLUCOSE 120 12/14/2014 0747   BUN 30 (H) 08/24/2018 1221   BUN 21.3 12/14/2014 0747   CREATININE 0.87 08/24/2018 1221   CREATININE 0.9 12/14/2014 0747   CALCIUM 9.5 08/24/2018 1221   CALCIUM 9.4 12/14/2014 0747   GFRNONAA 65 08/24/2018 1221   GFRAA 75 08/24/2018 1221   Lab Results  Component Value Date   HGBA1C 6.2 (H) 08/24/2018   HGBA1C 6.8 (H) 04/12/2018   Lab Results  Component Value Date   INSULIN 24.9 08/24/2018   INSULIN 30.6 (H) 04/12/2018   CBC    Component Value Date/Time   WBC 7.1 04/12/2018 1051   WBC 8.0 12/14/2014 0746   WBC 6.1 03/04/2011 1040   RBC 4.27 04/12/2018 1051   RBC 4.17 12/14/2014 0746   RBC 3.99 03/04/2011 1040   HGB 12.8 04/12/2018 1051   HGB 12.4 12/14/2014 0746   HCT 37.8 04/12/2018 1051   HCT 37.0 12/14/2014 0746    PLT 230 12/14/2014 0746   MCV 89 04/12/2018 1051   MCV 88.9 12/14/2014 0746   MCH 30.0 04/12/2018 1051   MCH 29.8 12/14/2014 0746   MCH 30.1 03/04/2011 1040   MCHC 33.9 04/12/2018 1051   MCHC 33.5 12/14/2014 0746   MCHC 33.1 03/04/2011 1040   RDW 12.4 04/12/2018 1051   RDW 12.5 12/14/2014 0746   LYMPHSABS 2.0 04/12/2018 1051   LYMPHSABS 1.8 12/14/2014 0746   MONOABS 0.5 12/14/2014 0746   EOSABS 0.2 04/12/2018 1051   BASOSABS 0.0 04/12/2018 1051   BASOSABS 0.1 12/14/2014 0746   Iron/TIBC/Ferritin/ %Sat No results found for: IRON, TIBC, FERRITIN, IRONPCTSAT Lipid Panel     Component Value Date/Time   CHOL 155 04/12/2018 1051   TRIG 123 04/12/2018 1051   HDL 60 04/12/2018 1051   LDLCALC 70 04/12/2018 1051   Hepatic Function Panel     Component Value Date/Time   PROT 7.2 08/24/2018 1221   PROT 7.1 12/14/2014 0747   ALBUMIN 4.1 08/24/2018 1221   ALBUMIN 3.8 12/14/2014 0747   AST 14 08/24/2018 1221   AST 16 12/14/2014 0747   ALT 16 08/24/2018 1221   ALT 15 12/14/2014 0747   ALKPHOS 70 08/24/2018 1221   ALKPHOS 52 12/14/2014 0747   BILITOT 0.3 08/24/2018 1221   BILITOT 0.40 12/14/2014 0747      Component Value Date/Time   TSH 1.480 04/12/2018 1051     Ref. Range 08/24/2018 12:21  Vitamin D, 25-Hydroxy Latest Ref Range: 30.0 - 100.0 ng/mL 46.3    I, Doreene Nest, am acting as Location manager for Abby Potash, PA-C I, Abby Potash, PA-C have reviewed above note and agree with its content

## 2018-10-27 ENCOUNTER — Other Ambulatory Visit: Payer: Self-pay

## 2018-10-27 ENCOUNTER — Ambulatory Visit (INDEPENDENT_AMBULATORY_CARE_PROVIDER_SITE_OTHER): Payer: Medicare Other | Admitting: Physician Assistant

## 2018-10-27 DIAGNOSIS — E559 Vitamin D deficiency, unspecified: Secondary | ICD-10-CM

## 2018-10-27 DIAGNOSIS — E119 Type 2 diabetes mellitus without complications: Secondary | ICD-10-CM

## 2018-10-27 DIAGNOSIS — Z6841 Body Mass Index (BMI) 40.0 and over, adult: Secondary | ICD-10-CM

## 2018-10-27 MED ORDER — VITAMIN D (ERGOCALCIFEROL) 1.25 MG (50000 UNIT) PO CAPS: 50000.0000 [IU] | ORAL_CAPSULE | ORAL | 0 refills | Status: DC

## 2018-10-27 MED ORDER — METFORMIN HCL 500 MG PO TABS: 500.0000 mg | ORAL_TABLET | Freq: Every day | ORAL | 1 refills | Status: DC

## 2018-10-27 NOTE — Progress Notes (Signed)
Office: [phone]  /  Fax: [phone] TeleHealth Visit:  Kathleen [name] has verbally consented to this TeleHealth visit today. The patient is located at home, the provider is located at the [facility] office. The participants in this visit include the listed provider and patient. The visit was conducted today via FaceTime.  HPI:   Chief Complaint: OBESITY Kathleen [name] is here to discuss her progress with her obesity treatment plan. She is on the Category 3 plan and is following her eating plan approximately 85% of the time. She states she is exercising 0 minutes 0 times per week. Kathleen [name] reports that her most recent weight is 242 lbs. She states she is undereating her protein at dinner. We were unable to weigh the patient today for this TeleHealth visit. She states her weight is 242 lbs. She has lost 16 lbs since starting treatment with [name].  Vitamin D deficiency Kathleen [name] has a diagnosis of Vitamin D deficiency. She is currently taking prescription Vit D and denies nausea, vomiting or muscle weakness.  Diabetes II Kathleen [name] has a diagnosis of diabetes type II and is on metformin. Kathleen [name] states fasting blood sugars range between 130 and 150. Last A1c was 6.2 on 08/24/2018. She has been working on intensive lifestyle modifications including diet, exercise, and weight loss to help control her blood glucose levels. No nausea, vomiting, or diarrhea. No polyphagia.  ASSESSMENT AND PLAN:  Vitamin D deficiency - Plan: Vitamin D, Ergocalciferol, (DRISDOL) 1.25 MG (50000 UT) CAPS capsule  Type 2 diabetes mellitus without complication, without long-term current use of insulin (HCC) - Plan: metFORMIN (GLUCOPHAGE) 500 MG tablet  Class 3 severe obesity with serious comorbidity and body mass index (BMI) of 40.0 to 44.9 in adult, unspecified obesity type ([name])  PLAN:  Vitamin D Deficiency Kathleen [name] was informed that low Vitamin D levels contributes to fatigue and are associated  with obesity, breast, and colon cancer. She agrees to continue to take prescription Vit D @ 50,000 IU every week #4 with 0 refills and will follow-up for routine testing of Vitamin D, at least 2-3 times per year. She was informed of the risk of over-replacement of Vitamin D and agrees to not increase her dose unless she discusses this with [name]. Kathleen [name] agrees to follow-up with our clinic in 2 weeks.  Diabetes II Kathleen [name] has been given extensive diabetes education by myself today including ideal fasting and post-prandial blood glucose readings, individual ideal HgA1c goals  and hypoglycemia prevention. We discussed the importance of good blood sugar control to decrease the likelihood of diabetic complications such as nephropathy, neuropathy, limb loss, blindness, coronary artery disease, and death. We discussed the importance of intensive lifestyle modification including diet, exercise and weight loss as the first line treatment for diabetes. Kathleen [name] was given a refill on her metformin #30 with 1 refill and agrees to follow-up with our clinic in 2 weeks.  Obesity Kathleen [name] is currently in the action stage of change. As such, her goal is to continue with weight loss efforts. She has agreed to follow the Category 3 plan. Kathleen [name] has been instructed to work up to a goal of 150 minutes of combined cardio and strengthening exercise per week for weight loss and overall health benefits. We discussed the following Behavioral Modification Strategies today: increasing lean protein intake, work on meal planning and easy cooking plans.  Kathleen [name] has agreed to follow-up with our clinic in 2 weeks. She was informed of the importance of frequent follow-up visits to maximize  her success with intensive lifestyle modifications for her multiple health conditions.  ALLERGIES: Allergies  Allergen Reactions  . Mobic [Meloxicam] Shortness Of Breath    "Wheezing"   . Naproxen Sodium Swelling    Hands and feet  .  Tape     Surgical tape adhesive and paper tape   . Doxycycline Itching and Rash    MEDICATIONS: Current Outpatient Medications on File Prior to Visit  Medication Sig Dispense Refill  . acetaminophen (TYLENOL) 500 MG tablet Take 500 mg by mouth every 6 (six) hours as needed. 1-2 tablets q 4-6 hrs            . aspirin 81 MG tablet Take 81 mg by mouth daily.    . betamethasone dipropionate (DIPROLENE) 0.05 % cream Apply topically 1 day or 1 dose.      . bisoprolol (ZEBETA) 5 MG tablet     . chlorthalidone (HYGROTON) 50 MG tablet Take 1 tablet (50 mg total) by mouth daily. 90 tablet 0  . fexofenadine (ALLEGRA) 180 MG tablet Take 180 mg by mouth daily.     Marland Kitchen FIBER ADULT GUMMIES 2 g CHEW Chew 2 tablets by mouth daily.    . Glucosamine HCl 1500 MG TABS Take 2 tablets by mouth daily.    Marland Kitchen glucose blood test strip 1 each by Other route as needed for other. Use as instructed    . ibuprofen (ADVIL,MOTRIN) 200 MG tablet Take 200 mg by mouth every 6 (six) hours as needed.      Javier Docker Oil 300 MG CAPS Take by mouth daily.      Marland Kitchen omeprazole (PRILOSEC) 20 MG capsule Take 20 mg by mouth daily.      Marland Kitchen omeprazole-sodium bicarbonate (ZEGERID) 40-1100 MG per capsule Take 1 capsule by mouth daily before breakfast.      . Probiotic Product (PROBIOTIC DAILY PO) Take by mouth.    . simvastatin (ZOCOR) 40 MG tablet Take 1 tablet (40 mg total) by mouth at bedtime. 90 tablet 0  . valsartan (DIOVAN) 80 MG tablet Take 1 tablet (80 mg total) by mouth 2 (two) times daily. 180 tablet 0   No current facility-administered medications on file prior to visit.     PAST MEDICAL HISTORY: Past Medical History:  Diagnosis Date  . Arthritis   . Back pain   . Breast cancer ([place]) 2012  . Cancer ([place])    colon  . Diabetes mellitus   . Family history of breast cancer   . Family history of colon cancer   . GERD (gastroesophageal reflux disease)   . Hyperlipidemia   . Hypertension   . Joint pain   . Kidney problem    . Lactose intolerance   . Neuromuscular disorder ([place])    back, hip, leg left side  . Obesity   . Personal history of radiation therapy 2012  . Sleep apnea   . Spinal stenosis     PAST SURGICAL HISTORY: Past Surgical History:  Procedure Laterality Date  . ABDOMINAL HYSTERECTOMY    . BREAST EXCISIONAL BIOPSY Left 1972  . BREAST LUMPECTOMY Right 2012  . BREAST SURGERY     biopsy  . COLON SURGERY     partial colectomy  . X-STOP IMPLANTATION  September 26th,2012    SOCIAL HISTORY: Social History   Tobacco Use  . Smoking status: Former Research scientist (life sciences)  . Smokeless tobacco: Never Used  Substance Use Topics  . Alcohol use: Yes  . Drug use: No  FAMILY HISTORY: Family History  Problem Relation Age of Onset  . Breast cancer Mother        dx late 71's  . Hyperlipidemia Mother   . Heart disease Mother   . Liver disease Mother   . Breast cancer Sister 32  . Heart disease Father   . Obesity Father   . Hypertension Father   . Diabetes Father   . Heart disease Maternal Grandmother   . Colon cancer Cousin        dx 31+  . Kidney cancer Son 30   ROS: Review of Systems  Gastrointestinal: Negative for diarrhea, nausea and vomiting.  Musculoskeletal:       Negative for muscle weakness.  Endo/Heme/Allergies:       Negative for polyphagia.   PHYSICAL EXAM: Pt in no acute distress  RECENT LABS AND TESTS: BMET    Component Value Date/Time   NA 141 08/24/2018 1221   NA 141 12/14/2014 0747   K 4.4 08/24/2018 1221   K 4.2 12/14/2014 0747   CL 100 08/24/2018 1221   CO2 24 08/24/2018 1221   CO2 28 12/14/2014 0747   GLUCOSE 124 (H) 08/24/2018 1221   GLUCOSE 120 12/14/2014 0747   BUN 30 (H) 08/24/2018 1221   BUN 21.3 12/14/2014 0747   CREATININE 0.87 08/24/2018 1221   CREATININE 0.9 12/14/2014 0747   CALCIUM 9.5 08/24/2018 1221   CALCIUM 9.4 12/14/2014 0747   GFRNONAA 65 08/24/2018 1221   GFRAA 75 08/24/2018 1221   Lab Results  Component Value Date   HGBA1C 6.2 (H)  08/24/2018   HGBA1C 6.8 (H) 04/12/2018   Lab Results  Component Value Date   INSULIN 24.9 08/24/2018   INSULIN 30.6 (H) 04/12/2018   CBC    Component Value Date/Time   WBC 7.1 04/12/2018 1051   WBC 8.0 12/14/2014 0746   WBC 6.1 03/04/2011 1040   RBC 4.27 04/12/2018 1051   RBC 4.17 12/14/2014 0746   RBC 3.99 03/04/2011 1040   HGB 12.8 04/12/2018 1051   HGB 12.4 12/14/2014 0746   HCT 37.8 04/12/2018 1051   HCT 37.0 12/14/2014 0746   PLT 230 12/14/2014 0746   MCV 89 04/12/2018 1051   MCV 88.9 12/14/2014 0746   MCH 30.0 04/12/2018 1051   MCH 29.8 12/14/2014 0746   MCH 30.1 03/04/2011 1040   MCHC 33.9 04/12/2018 1051   MCHC 33.5 12/14/2014 0746   MCHC 33.1 03/04/2011 1040   RDW 12.4 04/12/2018 1051   RDW 12.5 12/14/2014 0746   LYMPHSABS 2.0 04/12/2018 1051   LYMPHSABS 1.8 12/14/2014 0746   MONOABS 0.5 12/14/2014 0746   EOSABS 0.2 04/12/2018 1051   BASOSABS 0.0 04/12/2018 1051   BASOSABS 0.1 12/14/2014 0746   Iron/TIBC/Ferritin/ %Sat No results found for: IRON, TIBC, FERRITIN, IRONPCTSAT Lipid Panel     Component Value Date/Time   CHOL 155 04/12/2018 1051   TRIG 123 04/12/2018 1051   HDL 60 04/12/2018 1051   LDLCALC 70 04/12/2018 1051   Hepatic Function Panel     Component Value Date/Time   PROT 7.2 08/24/2018 1221   PROT 7.1 12/14/2014 0747   ALBUMIN 4.1 08/24/2018 1221   ALBUMIN 3.8 12/14/2014 0747   AST 14 08/24/2018 1221   AST 16 12/14/2014 0747   ALT 16 08/24/2018 1221   ALT 15 12/14/2014 0747   ALKPHOS 70 08/24/2018 1221   ALKPHOS 52 12/14/2014 0747   BILITOT 0.3 08/24/2018 1221   BILITOT 0.40 12/14/2014 0747  Component Value Date/Time   TSH 1.480 04/12/2018 1051   Results for OLIA, HINDERLITER (MRN [id number]) as of 10/27/2018 14:23  Ref. Range 08/24/2018 12:21  Vitamin D, 25-Hydroxy Latest Ref Range: 30.0 - 100.0 ng/mL 46.3    I, Michaelene Song, am acting as Location manager for [employer], PA-C I, Abby Potash, PA-C have reviewed above  note and agree with its content

## 2018-11-02 ENCOUNTER — Other Ambulatory Visit: Payer: Self-pay | Admitting: Hematology and Oncology

## 2018-11-02 DIAGNOSIS — Z1231 Encounter for screening mammogram for malignant neoplasm of breast: Secondary | ICD-10-CM

## 2018-11-11 ENCOUNTER — Encounter (INDEPENDENT_AMBULATORY_CARE_PROVIDER_SITE_OTHER): Payer: Self-pay | Admitting: Physician Assistant

## 2018-11-11 ENCOUNTER — Ambulatory Visit (INDEPENDENT_AMBULATORY_CARE_PROVIDER_SITE_OTHER): Payer: Medicare Other | Admitting: Physician Assistant

## 2018-11-11 ENCOUNTER — Other Ambulatory Visit: Payer: Self-pay

## 2018-11-11 DIAGNOSIS — Z6841 Body Mass Index (BMI) 40.0 and over, adult: Secondary | ICD-10-CM

## 2018-11-11 DIAGNOSIS — E119 Type 2 diabetes mellitus without complications: Secondary | ICD-10-CM | POA: Diagnosis not present

## 2018-11-11 DIAGNOSIS — E559 Vitamin D deficiency, unspecified: Secondary | ICD-10-CM

## 2018-11-11 MED ORDER — VITAMIN D (ERGOCALCIFEROL) 1.25 MG (50000 UNIT) PO CAPS: 50000.0000 [IU] | ORAL_CAPSULE | ORAL | 0 refills | Status: DC

## 2018-11-11 NOTE — Progress Notes (Signed)
Office: [phone]  /  Fax: [phone] TeleHealth Visit:  Kathleen [name] has verbally consented to this TeleHealth visit today. The patient is located at home, the provider is located at the [facility] office. The participants in this visit include the listed provider and patient. The visit was conducted today via FaceTime.  HPI:   Chief Complaint: OBESITY Kathleen [name] is here to discuss her progress with her obesity treatment plan. She is on the Category 3 plan and is following her eating plan approximately 80% of the time. She states she is exercising 0 minutes 0 times per week. Kathleen [name] reports that her most recent weight is 241 lbs. She got down to 239 lbs but took a road trip yesterday and ate at [employer]. She believes that she is retaining some fluid.  We were unable to weigh the patient today for this TeleHealth visit. She reports her most recent weight to be 241.2 lbs. She has lost 16 lbs since starting treatment with [name].  Diabetes II Kathleen [name] has a diagnosis of diabetes type II and is on metformin. Kathleen [name] states fasting blood sugars range between 120 and 146. Last A1c was 6.2 on 08/24/2018. She has been working on intensive lifestyle modifications including diet, exercise, and weight loss to help control her blood glucose levels. No nausea, vomiting, or diarrhea. No polyphagia.  Vitamin D deficiency Kathleen [name] has a diagnosis of Vitamin D deficiency. She is currently taking prescription Vit D and denies nausea, vomiting or muscle weakness.  ASSESSMENT AND PLAN:  Type 2 diabetes mellitus without complication, without long-term current use of insulin (HCC)  Vitamin D deficiency - Plan: Vitamin D, Ergocalciferol, (DRISDOL) 1.25 MG (50000 UT) CAPS capsule  Class 3 severe obesity with serious comorbidity and body mass index (BMI) of 40.0 to 44.9 in adult, unspecified obesity type (HCC)  PLAN:  Diabetes II Kathleen [name] has been given extensive diabetes education by  myself today including ideal fasting and post-prandial blood glucose readings, individual ideal HgA1c goals  and hypoglycemia prevention. We discussed the importance of good blood sugar control to decrease the likelihood of diabetic complications such as nephropathy, neuropathy, limb loss, blindness, coronary artery disease, and death. We discussed the importance of intensive lifestyle modification including diet, exercise and weight loss as the first line treatment for diabetes. Kathleen [name] agrees to continue her diabetes medications and will follow-up at the agreed upon time.  Vitamin D Deficiency Kathleen [name] was informed that low Vitamin D levels contributes to fatigue and are associated with obesity, breast, and colon cancer. She agrees to continue to take prescription Vit D @ 50,000 IU every week #4 with 0 refills and will follow-up for routine testing of Vitamin D, at least 2-3 times per year. She was informed of the risk of over-replacement of Vitamin D and agrees to not increase her dose unless she discusses this with [name]. Kathleen [name] agrees to follow-up with our clinic in 2 weeks.  Obesity Kathleen [name] is currently in the action stage of change. As such, her goal is to continue with weight loss efforts. She has agreed to follow the Category 3 plan. Kathleen [name] has been instructed to work up to a goal of 150 minutes of combined cardio and strengthening exercise per week for weight loss and overall health benefits. We discussed the following Behavioral Modification Strategies today: decrease eating out, work on meal planning and easy cooking plans.  Kathleen [name] has agreed to follow-up with our clinic in 2 weeks. She was informed of the importance of frequent  follow-up visits to maximize her success with intensive lifestyle modifications for her multiple health conditions.  ALLERGIES: Allergies  Allergen Reactions  . Mobic [Meloxicam] Shortness Of Breath    "Wheezing"   . Naproxen Sodium Swelling     Hands and feet  . Tape     Surgical tape adhesive and paper tape   . Doxycycline Itching and Rash    MEDICATIONS: Current Outpatient Medications on File Prior to Visit  Medication Sig Dispense Refill  . acetaminophen (TYLENOL) 500 MG tablet Take 500 mg by mouth every 6 (six) hours as needed. 1-2 tablets q 4-6 hrs            . aspirin 81 MG tablet Take 81 mg by mouth daily.    . betamethasone dipropionate (DIPROLENE) 0.05 % cream Apply topically 1 day or 1 dose.      . bisoprolol (ZEBETA) 5 MG tablet     . chlorthalidone (HYGROTON) 50 MG tablet Take 1 tablet (50 mg total) by mouth daily. 90 tablet 0  . fexofenadine (ALLEGRA) 180 MG tablet Take 180 mg by mouth daily.     Marland Kitchen FIBER ADULT GUMMIES 2 g CHEW Chew 2 tablets by mouth daily.    . Glucosamine HCl 1500 MG TABS Take 2 tablets by mouth daily.    Marland Kitchen glucose blood test strip 1 each by Other route as needed for other. Use as instructed    . ibuprofen (ADVIL,MOTRIN) 200 MG tablet Take 200 mg by mouth every 6 (six) hours as needed.      Javier Docker Oil 300 MG CAPS Take by mouth daily.      . metFORMIN (GLUCOPHAGE) 500 MG tablet Take 1 tablet (500 mg total) by mouth daily with breakfast. 30 tablet 1  . omeprazole (PRILOSEC) 20 MG capsule Take 20 mg by mouth daily.      Marland Kitchen omeprazole-sodium bicarbonate (ZEGERID) 40-1100 MG per capsule Take 1 capsule by mouth daily before breakfast.      . Probiotic Product (PROBIOTIC DAILY PO) Take by mouth.    . simvastatin (ZOCOR) 40 MG tablet Take 1 tablet (40 mg total) by mouth at bedtime. 90 tablet 0  . valsartan (DIOVAN) 80 MG tablet Take 1 tablet (80 mg total) by mouth 2 (two) times daily. 180 tablet 0   No current facility-administered medications on file prior to visit.     PAST MEDICAL HISTORY: Past Medical History:  Diagnosis Date  . Arthritis   . Back pain   . Breast cancer ([place]) 2012  . Cancer ([place])    colon  . Diabetes mellitus   . Family history of breast cancer   . Family history  of colon cancer   . GERD (gastroesophageal reflux disease)   . Hyperlipidemia   . Hypertension   . Joint pain   . Kidney problem   . Lactose intolerance   . Neuromuscular disorder ([place])    back, hip, leg left side  . Obesity   . Personal history of radiation therapy 2012  . Sleep apnea   . Spinal stenosis     PAST SURGICAL HISTORY: Past Surgical History:  Procedure Laterality Date  . ABDOMINAL HYSTERECTOMY    . BREAST EXCISIONAL BIOPSY Left 1972  . BREAST LUMPECTOMY Right 2012  . BREAST SURGERY     biopsy  . COLON SURGERY     partial colectomy  . X-STOP IMPLANTATION  September 26th,2012    SOCIAL HISTORY: Social History   Tobacco Use  . Smoking  status: Former Research scientist (life sciences)  . Smokeless tobacco: Never Used  Substance Use Topics  . Alcohol use: Yes  . Drug use: No    FAMILY HISTORY: Family History  Problem Relation Age of Onset  . Breast cancer Mother        dx late 75's  . Hyperlipidemia Mother   . Heart disease Mother   . Liver disease Mother   . Breast cancer Sister 26  . Heart disease Father   . Obesity Father   . Hypertension Father   . Diabetes Father   . Heart disease Maternal Grandmother   . Colon cancer Cousin        dx 56+  . Kidney cancer Son 46   ROS: Review of Systems  Gastrointestinal: Negative for diarrhea, nausea and vomiting.  Musculoskeletal:       Negative for muscle weakness.  Endo/Heme/Allergies:       Negative for polyphagia.   PHYSICAL EXAM: Pt in no acute distress  RECENT LABS AND TESTS: BMET    Component Value Date/Time   NA 141 08/24/2018 1221   NA 141 12/14/2014 0747   K 4.4 08/24/2018 1221   K 4.2 12/14/2014 0747   CL 100 08/24/2018 1221   CO2 24 08/24/2018 1221   CO2 28 12/14/2014 0747   GLUCOSE 124 (H) 08/24/2018 1221   GLUCOSE 120 12/14/2014 0747   BUN 30 (H) 08/24/2018 1221   BUN 21.3 12/14/2014 0747   CREATININE 0.87 08/24/2018 1221   CREATININE 0.9 12/14/2014 0747   CALCIUM 9.5 08/24/2018 1221   CALCIUM 9.4  12/14/2014 0747   GFRNONAA 65 08/24/2018 1221   GFRAA 75 08/24/2018 1221   Lab Results  Component Value Date   HGBA1C 6.2 (H) 08/24/2018   HGBA1C 6.8 (H) 04/12/2018   Lab Results  Component Value Date   INSULIN 24.9 08/24/2018   INSULIN 30.6 (H) 04/12/2018   CBC    Component Value Date/Time   WBC 7.1 04/12/2018 1051   WBC 8.0 12/14/2014 0746   WBC 6.1 03/04/2011 1040   RBC 4.27 04/12/2018 1051   RBC 4.17 12/14/2014 0746   RBC 3.99 03/04/2011 1040   HGB 12.8 04/12/2018 1051   HGB 12.4 12/14/2014 0746   HCT 37.8 04/12/2018 1051   HCT 37.0 12/14/2014 0746   PLT 230 12/14/2014 0746   MCV 89 04/12/2018 1051   MCV 88.9 12/14/2014 0746   MCH 30.0 04/12/2018 1051   MCH 29.8 12/14/2014 0746   MCH 30.1 03/04/2011 1040   MCHC 33.9 04/12/2018 1051   MCHC 33.5 12/14/2014 0746   MCHC 33.1 03/04/2011 1040   RDW 12.4 04/12/2018 1051   RDW 12.5 12/14/2014 0746   LYMPHSABS 2.0 04/12/2018 1051   LYMPHSABS 1.8 12/14/2014 0746   MONOABS 0.5 12/14/2014 0746   EOSABS 0.2 04/12/2018 1051   BASOSABS 0.0 04/12/2018 1051   BASOSABS 0.1 12/14/2014 0746   Iron/TIBC/Ferritin/ %Sat No results found for: IRON, TIBC, FERRITIN, IRONPCTSAT Lipid Panel     Component Value Date/Time   CHOL 155 04/12/2018 1051   TRIG 123 04/12/2018 1051   HDL 60 04/12/2018 1051   LDLCALC 70 04/12/2018 1051   Hepatic Function Panel     Component Value Date/Time   PROT 7.2 08/24/2018 1221   PROT 7.1 12/14/2014 0747   ALBUMIN 4.1 08/24/2018 1221   ALBUMIN 3.8 12/14/2014 0747   AST 14 08/24/2018 1221   AST 16 12/14/2014 0747   ALT 16 08/24/2018 1221   ALT 15 12/14/2014 0747  ALKPHOS 70 08/24/2018 1221   ALKPHOS 52 12/14/2014 0747   BILITOT 0.3 08/24/2018 1221   BILITOT 0.40 12/14/2014 0747      Component Value Date/Time   TSH 1.480 04/12/2018 1051   Results for MADELINE, PHO (MRN [id number]) as of 11/11/2018 10:07  Ref. Range 08/24/2018 12:21  Vitamin D, 25-Hydroxy Latest Ref Range: 30.0 - 100.0  ng/mL 46.3    I, Michaelene Song, am acting as Location manager for [employer], PA-C I, Abby Potash, PA-C have reviewed above note and agree with its content

## 2018-11-19 ENCOUNTER — Other Ambulatory Visit (INDEPENDENT_AMBULATORY_CARE_PROVIDER_SITE_OTHER): Payer: Self-pay | Admitting: Physician Assistant

## 2018-11-19 DIAGNOSIS — I10 Essential (primary) hypertension: Secondary | ICD-10-CM

## 2018-11-20 ENCOUNTER — Other Ambulatory Visit (INDEPENDENT_AMBULATORY_CARE_PROVIDER_SITE_OTHER): Payer: Self-pay | Admitting: Physician Assistant

## 2018-11-20 DIAGNOSIS — E559 Vitamin D deficiency, unspecified: Secondary | ICD-10-CM

## 2018-11-24 ENCOUNTER — Other Ambulatory Visit: Payer: Self-pay | Admitting: Family Medicine

## 2018-11-24 ENCOUNTER — Encounter (INDEPENDENT_AMBULATORY_CARE_PROVIDER_SITE_OTHER): Payer: Self-pay | Admitting: Physician Assistant

## 2018-11-24 ENCOUNTER — Ambulatory Visit (INDEPENDENT_AMBULATORY_CARE_PROVIDER_SITE_OTHER): Payer: Medicare Other | Admitting: Physician Assistant

## 2018-11-24 ENCOUNTER — Other Ambulatory Visit: Payer: Self-pay

## 2018-11-24 DIAGNOSIS — E7849 Other hyperlipidemia: Secondary | ICD-10-CM

## 2018-11-24 DIAGNOSIS — I10 Essential (primary) hypertension: Secondary | ICD-10-CM | POA: Diagnosis not present

## 2018-11-24 DIAGNOSIS — Z6841 Body Mass Index (BMI) 40.0 and over, adult: Secondary | ICD-10-CM

## 2018-11-24 DIAGNOSIS — E2839 Other primary ovarian failure: Secondary | ICD-10-CM

## 2018-11-25 MED ORDER — VALSARTAN 80 MG PO TABS: 80.0000 mg | ORAL_TABLET | Freq: Two times a day (BID) | ORAL | 0 refills | Status: DC

## 2018-11-25 MED ORDER — CHLORTHALIDONE 50 MG PO TABS: 50.0000 mg | ORAL_TABLET | Freq: Every day | ORAL | 0 refills | Status: DC

## 2018-11-25 MED ORDER — SIMVASTATIN 40 MG PO TABS: 40.0000 mg | ORAL_TABLET | Freq: Every day | ORAL | 0 refills | Status: DC

## 2018-11-25 NOTE — Progress Notes (Signed)
Office: [phone]  /  Fax: [phone] TeleHealth Visit:  Kathleen [name] has verbally consented to this TeleHealth visit today. The patient is located at [employer], the provider is located at the [facility] office. The participants in this visit include the listed provider and patient and any and all parties involved. The visit was conducted today via FaceTime.  HPI:   Chief Complaint: OBESITY Kathleen [name] is here to discuss her progress with her obesity treatment plan. She is on the Category 3 plan and is following her eating plan approximately 80 % of the time. She states she is walking 5,000 steps 7 times per week. Kathleen [name]'s most recent weight was 240 pounds. She is currently at the beach and she has been cooking. Kathleen [name] is trying to get more protein in during the day. We were unable to weigh the patient today for this TeleHealth visit. She feels as if she has lost weight since her last visit. She has lost 19 lbs since starting treatment with [name].  Hypertension Kathleen [name] is a 78 y.o. female with hypertension. She is on Chlorthalidone and [place] denies chest pain or headache. She is working weight loss to help control her blood pressure with the goal of decreasing her risk of heart attack and stroke. Kathleen [name] blood pressure is currently controlled.  Hyperlipidemia Kathleen [name] has hyperlipidemia and she is on Simvastatin. She has been trying to improve her cholesterol levels with intensive lifestyle modification including a low saturated fat diet, exercise and weight loss. She denies any chest pain.  ASSESSMENT AND PLAN:  Essential hypertension - Plan: valsartan (DIOVAN) 80 MG tablet, chlorthalidone (HYGROTON) 50 MG tablet  Other hyperlipidemia - Plan: simvastatin (ZOCOR) 40 MG tablet  Class 3 severe obesity with serious comorbidity and body mass index (BMI) of 40.0 to 44.9 in adult, unspecified obesity type  ([place])  PLAN:  Hypertension We discussed sodium restriction, working on healthy weight loss, and a regular exercise program as the means to achieve improved blood pressure control. Kathleen [name] agreed with this plan and agreed to follow up as directed. We will continue to monitor her blood pressure as well as her progress with the above lifestyle modifications. She agrees to continue Chlorthalidone 50 mg daily #90 with no refills and Valsartan 80 mg twice daily #180 with no refills and she will watch for signs of hypotension as she continues her lifestyle modifications.  Hyperlipidemia Kathleen [name] was informed of the American Heart Association Guidelines emphasizing intensive lifestyle modifications as the first line treatment for hyperlipidemia. We discussed many lifestyle modifications today in depth, and Kathleen [name] will continue to work on decreasing saturated fats such as fatty red meat, butter and many fried foods. She will also increase vegetables and lean protein in her diet and continue to work on exercise and weight loss efforts. Kathleen [name] agrees to continue Simvastatin 40 mg once daily at bedtime #90 with no refills and follow up as directed.  Obesity Kathleen [name] is currently in the action stage of change. As such, her goal is to continue with weight loss efforts She has agreed to follow the Category 3 [place] has been instructed to work up to a goal of 150 minutes of combined cardio and strengthening exercise per week for weight loss and overall health benefits. We discussed the following Behavioral Modification Strategies today: increase H2O intake and keeping healthy foods in the home  Kathleen [name] has agreed to follow up with our clinic in 2 weeks. She was informed  of the importance of frequent follow up visits to maximize her success with intensive lifestyle modifications for her multiple health conditions.  ALLERGIES: Allergies  Allergen Reactions   Mobic [Meloxicam] Shortness Of Breath     "Wheezing"    Naproxen Sodium Swelling    Hands and feet   Tape     Surgical tape adhesive and paper tape    Doxycycline Itching and Rash    MEDICATIONS: Current Outpatient Medications on File Prior to Visit  Medication Sig Dispense Refill   acetaminophen (TYLENOL) 500 MG tablet Take 500 mg by mouth every 6 (six) hours as needed. 1-2 tablets q 4-6 hrs             aspirin 81 MG tablet Take 81 mg by mouth daily.     betamethasone dipropionate (DIPROLENE) 0.05 % cream Apply topically 1 day or 1 dose.       bisoprolol (ZEBETA) 5 MG tablet      fexofenadine (ALLEGRA) 180 MG tablet Take 180 mg by mouth daily.      FIBER ADULT GUMMIES 2 g CHEW Chew 2 tablets by mouth daily.     Glucosamine HCl 1500 MG TABS Take 2 tablets by mouth daily.     glucose blood test strip 1 each by Other route as needed for other. Use as instructed     ibuprofen (ADVIL,MOTRIN) 200 MG tablet Take 200 mg by mouth every 6 (six) hours as needed.       Krill Oil 300 MG CAPS Take by mouth daily.       metFORMIN (GLUCOPHAGE) 500 MG tablet Take 1 tablet (500 mg total) by mouth daily with breakfast. 30 tablet 1   omeprazole (PRILOSEC) 20 MG capsule Take 20 mg by mouth daily.       omeprazole-sodium bicarbonate (ZEGERID) 40-1100 MG per capsule Take 1 capsule by mouth daily before breakfast.       Probiotic Product (PROBIOTIC DAILY PO) Take by mouth.     Vitamin D, Ergocalciferol, (DRISDOL) 1.25 MG (50000 UT) CAPS capsule Take 1 capsule (50,000 Units total) by mouth every 7 (seven) days. 4 capsule 0   No current facility-administered medications on file prior to visit.     PAST MEDICAL HISTORY: Past Medical History:  Diagnosis Date   Arthritis    Back pain    Breast cancer ([place]) 2012   Cancer [facility])    colon   Diabetes mellitus    Family history of breast cancer    Family history of colon cancer    GERD (gastroesophageal reflux disease)    Hyperlipidemia    Hypertension     Joint pain    Kidney problem    Lactose intolerance    Neuromuscular disorder ([place])    back, hip, leg left side   Obesity    Personal history of radiation therapy 2012   Sleep apnea    Spinal stenosis     PAST SURGICAL HISTORY: Past Surgical History:  Procedure Laterality Date   ABDOMINAL HYSTERECTOMY     BREAST EXCISIONAL BIOPSY Left 1972   BREAST LUMPECTOMY Right 2012   BREAST SURGERY     biopsy   COLON SURGERY     partial colectomy   X-STOP IMPLANTATION  September 26th,2012    SOCIAL HISTORY: Social History   Tobacco Use   Smoking status: Former Smoker   Smokeless tobacco: Never Used  Substance Use Topics   Alcohol use: Yes   Drug use: No    FAMILY HISTORY:  Family History  Problem Relation Age of Onset   Breast cancer Mother        dx late 24's   Hyperlipidemia Mother    Heart disease Mother    Liver disease Mother    Breast cancer Sister 5   Heart disease Father    Obesity Father    Hypertension Father    Diabetes Father    Heart disease Maternal Grandmother    Colon cancer Cousin        dx 70+   Kidney cancer Son 57    ROS: Review of Systems  Constitutional: Positive for weight loss.  Cardiovascular: Negative for chest pain.  Neurological: Negative for headaches.    PHYSICAL EXAM: Pt in no acute distress  RECENT LABS AND TESTS: BMET    Component Value Date/Time   NA 141 08/24/2018 1221   NA 141 12/14/2014 0747   K 4.4 08/24/2018 1221   K 4.2 12/14/2014 0747   CL 100 08/24/2018 1221   CO2 24 08/24/2018 1221   CO2 28 12/14/2014 0747   GLUCOSE 124 (H) 08/24/2018 1221   GLUCOSE 120 12/14/2014 0747   BUN 30 (H) 08/24/2018 1221   BUN 21.3 12/14/2014 0747   CREATININE 0.87 08/24/2018 1221   CREATININE 0.9 12/14/2014 0747   CALCIUM 9.5 08/24/2018 1221   CALCIUM 9.4 12/14/2014 0747   GFRNONAA 65 08/24/2018 1221   GFRAA 75 08/24/2018 1221   Lab Results  Component Value Date   HGBA1C 6.2 (H) 08/24/2018    HGBA1C 6.8 (H) 04/12/2018   Lab Results  Component Value Date   INSULIN 24.9 08/24/2018   INSULIN 30.6 (H) 04/12/2018   CBC    Component Value Date/Time   WBC 7.1 04/12/2018 1051   WBC 8.0 12/14/2014 0746   WBC 6.1 03/04/2011 1040   RBC 4.27 04/12/2018 1051   RBC 4.17 12/14/2014 0746   RBC 3.99 03/04/2011 1040   HGB 12.8 04/12/2018 1051   HGB 12.4 12/14/2014 0746   HCT 37.8 04/12/2018 1051   HCT 37.0 12/14/2014 0746   PLT 230 12/14/2014 0746   MCV 89 04/12/2018 1051   MCV 88.9 12/14/2014 0746   MCH 30.0 04/12/2018 1051   MCH 29.8 12/14/2014 0746   MCH 30.1 03/04/2011 1040   MCHC 33.9 04/12/2018 1051   MCHC 33.5 12/14/2014 0746   MCHC 33.1 03/04/2011 1040   RDW 12.4 04/12/2018 1051   RDW 12.5 12/14/2014 0746   LYMPHSABS 2.0 04/12/2018 1051   LYMPHSABS 1.8 12/14/2014 0746   MONOABS 0.5 12/14/2014 0746   EOSABS 0.2 04/12/2018 1051   BASOSABS 0.0 04/12/2018 1051   BASOSABS 0.1 12/14/2014 0746   Iron/TIBC/Ferritin/ %Sat No results found for: IRON, TIBC, FERRITIN, IRONPCTSAT Lipid Panel     Component Value Date/Time   CHOL 155 04/12/2018 1051   TRIG 123 04/12/2018 1051   HDL 60 04/12/2018 1051   LDLCALC 70 04/12/2018 1051   Hepatic Function Panel     Component Value Date/Time   PROT 7.2 08/24/2018 1221   PROT 7.1 12/14/2014 0747   ALBUMIN 4.1 08/24/2018 1221   ALBUMIN 3.8 12/14/2014 0747   AST 14 08/24/2018 1221   AST 16 12/14/2014 0747   ALT 16 08/24/2018 1221   ALT 15 12/14/2014 0747   ALKPHOS 70 08/24/2018 1221   ALKPHOS 52 12/14/2014 0747   BILITOT 0.3 08/24/2018 1221   BILITOT 0.40 12/14/2014 0747      Component Value Date/Time   TSH 1.480 04/12/2018 1051  Ref. Range 08/24/2018 12:21  Vitamin D, 25-Hydroxy Latest Ref Range: 30.0 - 100.0 ng/mL 46.3    I, Doreene Nest, am acting as Location manager for Abby Potash, PA-C I, Abby Potash, PA-C have reviewed above note and agree with its conten

## 2018-11-30 ENCOUNTER — Encounter (INDEPENDENT_AMBULATORY_CARE_PROVIDER_SITE_OTHER): Payer: Self-pay | Admitting: Family Medicine

## 2018-12-02 NOTE — Telephone Encounter (Signed)
Please advise 

## 2018-12-03 ENCOUNTER — Telehealth: Payer: Self-pay | Admitting: Adult Health

## 2018-12-03 NOTE — Telephone Encounter (Signed)
I left a message regarding visit and she could do as a virtual to call back

## 2018-12-06 ENCOUNTER — Telehealth: Payer: Self-pay | Admitting: Adult Health

## 2018-12-06 NOTE — Telephone Encounter (Signed)
Patient called back and said she had an iphone

## 2018-12-08 ENCOUNTER — Encounter (INDEPENDENT_AMBULATORY_CARE_PROVIDER_SITE_OTHER): Payer: Self-pay | Admitting: Physician Assistant

## 2018-12-08 ENCOUNTER — Ambulatory Visit (INDEPENDENT_AMBULATORY_CARE_PROVIDER_SITE_OTHER): Payer: Medicare Other | Admitting: Physician Assistant

## 2018-12-08 ENCOUNTER — Other Ambulatory Visit: Payer: Self-pay

## 2018-12-08 DIAGNOSIS — Z6841 Body Mass Index (BMI) 40.0 and over, adult: Secondary | ICD-10-CM | POA: Diagnosis not present

## 2018-12-08 DIAGNOSIS — E119 Type 2 diabetes mellitus without complications: Secondary | ICD-10-CM

## 2018-12-09 NOTE — Progress Notes (Signed)
Office: [phone]  /  Fax: [phone] TeleHealth Visit:  Kathleen [name] has verbally consented to this TeleHealth visit today. The patient is located at home, the provider is located at the [employer] [facility] office. The participants in this visit include the listed provider and patient. The visit was conducted today via Face Time.  HPI:   Chief Complaint: OBESITY Kathleen [name] is here to discuss her progress with her obesity treatment plan. She is on the Category 3 plan and is following her eating plan approximately 50 % of the time. She states she is walking some during the week. Kathleen [name] reports that she has been at [employer] for 2 weeks and did not eat completely on plan. Since returning, she is back on track.  We were unable to weigh the patient today for this TeleHealth visit. She feels as if she has maintained weight since her last visit. She has lost 16 lbs since starting treatment with [name].  Diabetes II Kathleen [name] has a diagnosis of diabetes type II. Kathleen [name] states that her fasting blood sugars range between 130 and 150. She is on metformin and her last  A1c was 6.2 on 08/24/18. She has been working on intensive lifestyle modifications including diet, exercise, and weight loss to help control her blood glucose levels.   ASSESSMENT AND PLAN:  Type 2 diabetes mellitus without complication, without long-term current use of insulin (HCC)  Class 3 severe obesity with serious comorbidity and body mass index (BMI) of 40.0 to 44.9 in adult, unspecified obesity type ([place])  PLAN:  Diabetes II Kathleen [name] has been given extensive diabetes education by myself today including ideal fasting and post-prandial blood glucose readings, individual ideal Hgb A1c goals, and hypoglycemia prevention. We discussed the importance of good blood sugar control to decrease the likelihood of diabetic complications such as nephropathy, neuropathy, limb loss, blindness, coronary artery disease, and death. We  discussed the importance of intensive lifestyle modification including diet, exercise, and weight loss as the first line treatment for diabetes. Kathleen [name] agrees to continue her diabetes medications and weight loss. She will follow up at the agreed upon time in 2 to 3 weeks.  Obesity Kathleen [name] is currently in the action stage of change. As such, her goal is to continue with weight loss efforts. She has agreed to follow the Category 3 plan. Kathleen [name] has been instructed to work up to a goal of 150 minutes of combined cardio and strengthening exercise per week for weight loss and overall health benefits. We discussed the following Behavioral Modification Strategies today: work on meal planning and easy cooking plans and keeping healthy foods in the home.  Kathleen [name] has agreed to follow up with our clinic in 2 to 3 weeks. She was informed of the importance of frequent follow up visits to maximize her success with intensive lifestyle modifications for her multiple health conditions.  ALLERGIES: Allergies  Allergen Reactions  . Mobic [Meloxicam] Shortness Of Breath    "Wheezing"   . Naproxen Sodium Swelling    Hands and feet  . Tape     Surgical tape adhesive and paper tape   . Doxycycline Itching and Rash    MEDICATIONS: Current Outpatient Medications on File Prior to Visit  Medication Sig Dispense Refill  . acetaminophen (TYLENOL) 500 MG tablet Take 500 mg by mouth every 6 (six) hours as needed. 1-2 tablets q 4-6 hrs            . aspirin 81 MG tablet Take 81 mg by  mouth daily.    . betamethasone dipropionate (DIPROLENE) 0.05 % cream Apply topically 1 day or 1 dose.      . bisoprolol (ZEBETA) 5 MG tablet     . chlorthalidone (HYGROTON) 50 MG tablet Take 1 tablet (50 mg total) by mouth daily. 90 tablet 0  . fexofenadine (ALLEGRA) 180 MG tablet Take 180 mg by mouth daily.     Marland Kitchen FIBER ADULT GUMMIES 2 g CHEW Chew 2 tablets by mouth daily.    . Glucosamine HCl 1500 MG TABS Take 2 tablets  by mouth daily.    Marland Kitchen glucose blood test strip 1 each by Other route as needed for other. Use as instructed    . ibuprofen (ADVIL,MOTRIN) 200 MG tablet Take 200 mg by mouth every 6 (six) hours as needed.      Javier Docker Oil 300 MG CAPS Take by mouth daily.      . metFORMIN (GLUCOPHAGE) 500 MG tablet Take 1 tablet (500 mg total) by mouth daily with breakfast. 30 tablet 1  . omeprazole (PRILOSEC) 20 MG capsule Take 20 mg by mouth daily.      Marland Kitchen omeprazole-sodium bicarbonate (ZEGERID) 40-1100 MG per capsule Take 1 capsule by mouth daily before breakfast.      . Probiotic Product (PROBIOTIC DAILY PO) Take by mouth.    . simvastatin (ZOCOR) 40 MG tablet Take 1 tablet (40 mg total) by mouth at bedtime. 90 tablet 0  . valsartan (DIOVAN) 80 MG tablet Take 1 tablet (80 mg total) by mouth 2 (two) times daily. 180 tablet 0  . Vitamin D, Ergocalciferol, (DRISDOL) 1.25 MG (50000 UT) CAPS capsule Take 1 capsule (50,000 Units total) by mouth every 7 (seven) days. 4 capsule 0   No current facility-administered medications on file prior to visit.     PAST MEDICAL HISTORY: Past Medical History:  Diagnosis Date  . Arthritis   . Back pain   . Breast cancer ([place]) 2012  . Cancer ([place])    colon  . Diabetes mellitus   . Family history of breast cancer   . Family history of colon cancer   . GERD (gastroesophageal reflux disease)   . Hyperlipidemia   . Hypertension   . Joint pain   . Kidney problem   . Lactose intolerance   . Neuromuscular disorder ([place])    back, hip, leg left side  . Obesity   . Personal history of radiation therapy 2012  . Sleep apnea   . Spinal stenosis     PAST SURGICAL HISTORY: Past Surgical History:  Procedure Laterality Date  . ABDOMINAL HYSTERECTOMY    . BREAST EXCISIONAL BIOPSY Left 1972  . BREAST LUMPECTOMY Right 2012  . BREAST SURGERY     biopsy  . COLON SURGERY     partial colectomy  . X-STOP IMPLANTATION  September 26th,2012    SOCIAL HISTORY: Social History    Tobacco Use  . Smoking status: Former Research scientist (life sciences)  . Smokeless tobacco: Never Used  Substance Use Topics  . Alcohol use: Yes  . Drug use: No    FAMILY HISTORY: Family History  Problem Relation Age of Onset  . Breast cancer Mother        dx late 22's  . Hyperlipidemia Mother   . Heart disease Mother   . Liver disease Mother   . Breast cancer Sister 64  . Heart disease Father   . Obesity Father   . Hypertension Father   . Diabetes Father   .  Heart disease Maternal Grandmother   . Colon cancer Cousin        dx 85+  . Kidney cancer Son 37    ROS: ROS  PHYSICAL EXAM: Pt in no acute distress  RECENT LABS AND TESTS: BMET    Component Value Date/Time   NA 141 08/24/2018 1221   NA 141 12/14/2014 0747   K 4.4 08/24/2018 1221   K 4.2 12/14/2014 0747   CL 100 08/24/2018 1221   CO2 24 08/24/2018 1221   CO2 28 12/14/2014 0747   GLUCOSE 124 (H) 08/24/2018 1221   GLUCOSE 120 12/14/2014 0747   BUN 30 (H) 08/24/2018 1221   BUN 21.3 12/14/2014 0747   CREATININE 0.87 08/24/2018 1221   CREATININE 0.9 12/14/2014 0747   CALCIUM 9.5 08/24/2018 1221   CALCIUM 9.4 12/14/2014 0747   GFRNONAA 65 08/24/2018 1221   GFRAA 75 08/24/2018 1221   Lab Results  Component Value Date   HGBA1C 6.2 (H) 08/24/2018   HGBA1C 6.8 (H) 04/12/2018   Lab Results  Component Value Date   INSULIN 24.9 08/24/2018   INSULIN 30.6 (H) 04/12/2018   CBC    Component Value Date/Time   WBC 7.1 04/12/2018 1051   WBC 8.0 12/14/2014 0746   WBC 6.1 03/04/2011 1040   RBC 4.27 04/12/2018 1051   RBC 4.17 12/14/2014 0746   RBC 3.99 03/04/2011 1040   HGB 12.8 04/12/2018 1051   HGB 12.4 12/14/2014 0746   HCT 37.8 04/12/2018 1051   HCT 37.0 12/14/2014 0746   PLT 230 12/14/2014 0746   MCV 89 04/12/2018 1051   MCV 88.9 12/14/2014 0746   MCH 30.0 04/12/2018 1051   MCH 29.8 12/14/2014 0746   MCH 30.1 03/04/2011 1040   MCHC 33.9 04/12/2018 1051   MCHC 33.5 12/14/2014 0746   MCHC 33.1 03/04/2011 1040   RDW  12.4 04/12/2018 1051   RDW 12.5 12/14/2014 0746   LYMPHSABS 2.0 04/12/2018 1051   LYMPHSABS 1.8 12/14/2014 0746   MONOABS 0.5 12/14/2014 0746   EOSABS 0.2 04/12/2018 1051   BASOSABS 0.0 04/12/2018 1051   BASOSABS 0.1 12/14/2014 0746   Iron/TIBC/Ferritin/ %Sat No results found for: IRON, TIBC, FERRITIN, IRONPCTSAT Lipid Panel     Component Value Date/Time   CHOL 155 04/12/2018 1051   TRIG 123 04/12/2018 1051   HDL 60 04/12/2018 1051   LDLCALC 70 04/12/2018 1051   Hepatic Function Panel     Component Value Date/Time   PROT 7.2 08/24/2018 1221   PROT 7.1 12/14/2014 0747   ALBUMIN 4.1 08/24/2018 1221   ALBUMIN 3.8 12/14/2014 0747   AST 14 08/24/2018 1221   AST 16 12/14/2014 0747   ALT 16 08/24/2018 1221   ALT 15 12/14/2014 0747   ALKPHOS 70 08/24/2018 1221   ALKPHOS 52 12/14/2014 0747   BILITOT 0.3 08/24/2018 1221   BILITOT 0.40 12/14/2014 0747      Component Value Date/Time   TSH 1.480 04/12/2018 1051    Results for Kathleen, [name] (MRN [id number]) as of 12/09/2018 07:50  Ref. Range 08/24/2018 12:21  Vitamin D, 25-Hydroxy Latest Ref Range: 30.0 - 100.0 ng/mL 46.3    I, Marcille Blanco, CMA, am acting as transcriptionist for Abby Potash, PA-C I, Abby Potash, PA-C have reviewed above note and agree with its content

## 2018-12-20 ENCOUNTER — Ambulatory Visit: Payer: Self-pay

## 2018-12-21 ENCOUNTER — Ambulatory Visit
Admission: RE | Admit: 2018-12-21 | Discharge: 2018-12-21 | Disposition: A | Discharge status: Home / Self Care | Payer: Medicare Other | Source: Ambulatory Visit | Attending: Hematology and Oncology | Admitting: Hematology and Oncology

## 2018-12-21 ENCOUNTER — Other Ambulatory Visit: Payer: Self-pay

## 2018-12-21 DIAGNOSIS — Z1231 Encounter for screening mammogram for malignant neoplasm of breast: Secondary | ICD-10-CM

## 2018-12-22 ENCOUNTER — Telehealth: Payer: Self-pay | Admitting: Adult Health

## 2018-12-22 NOTE — Telephone Encounter (Signed)
Contacted patient to verify webex visit for pre reg °

## 2018-12-23 ENCOUNTER — Inpatient Hospital Stay: Payer: Medicare Other | Attending: Adult Health | Admitting: Adult Health

## 2018-12-23 ENCOUNTER — Encounter: Payer: Self-pay | Admitting: Adult Health

## 2018-12-23 DIAGNOSIS — Z923 Personal history of irradiation: Secondary | ICD-10-CM | POA: Diagnosis not present

## 2018-12-23 DIAGNOSIS — Z170 Estrogen receptor positive status [ER+]: Secondary | ICD-10-CM | POA: Diagnosis not present

## 2018-12-23 DIAGNOSIS — Z79810 Long term (current) use of selective estrogen receptor modulators (SERMs): Secondary | ICD-10-CM | POA: Diagnosis not present

## 2018-12-23 DIAGNOSIS — Z87891 Personal history of nicotine dependence: Secondary | ICD-10-CM

## 2018-12-23 DIAGNOSIS — C50411 Malignant neoplasm of upper-outer quadrant of right female breast: Secondary | ICD-10-CM | POA: Diagnosis not present

## 2018-12-23 NOTE — Progress Notes (Signed)
SURVIVORSHIP VIRTUAL VISIT:  I connected with Kathleen [name] on 12/23/18 at  3:00 PM EDT by doximity video and verified that I am speaking with the correct person using two identifiers.   I discussed the limitations, risks, security and privacy concerns of performing an evaluation and management service virtually and the availability of in person appointments. I also discussed with the patient that there may be a patient responsible charge related to this service. The patient expressed understanding and agreed to proceed.   REASON FOR VISIT:  Routine follow-up for history of breast cancer.   BRIEF ONCOLOGIC HISTORY:  Oncology History  Breast cancer of upper-outer quadrant of right female breast ([place])  10/28/2010 Surgery   Right breast lumpectomy 1.8 cm IDC grade 2 with associated DCIS ER 100% PR 100% Ki-67 26% HER-2 -2 SLN negative Oncotype DX 0   12/10/2010 - 01/28/2011 Radiation Therapy   Radiation therapy to lumpectomy site   04/14/2011 - 04/13/2016 Anti-estrogen oral therapy   Tamoxifen 20 mg daily      INTERVAL HISTORY:  Kathleen [name] presents to the [facility] today for routine follow-up for her history of breast cancer.  Overall, she reports feeling quite well. She has lost 20 pounds.  She went to [facility].  She notes that she is following their dietary plan that is high protein low carbohydrate diet.  This is because her pancreas produces plenty of insulin.  She notes that this has not only helped her weight but also her A1C.  Her fasting blood sugars have improved as well.    Kathleen [name] sees PCP regularly.  She is up to date with colon cancer screen.  She has regular skin checks.  She underwent her mammogram on 12/21/2018 that was normal and had breast density category C.   She notes her blood pressure today was 117/80.  REVIEW OF SYSTEMS:  Review of Systems  Constitutional: Negative for appetite change, chills, fatigue, fever and unexpected weight  change.  HENT:   Negative for hearing loss, lump/mass and sore throat.   Eyes: Negative for eye problems and icterus.  Respiratory: Negative for chest tightness, cough and shortness of breath.   Cardiovascular: Negative for chest pain, leg swelling and palpitations.  Gastrointestinal: Negative for abdominal distention, abdominal pain, constipation, diarrhea, nausea and vomiting.  Endocrine: Negative for hot flashes.  Genitourinary: Negative for difficulty urinating.   Musculoskeletal: Negative for arthralgias.  Skin: Negative for itching and rash.  Neurological: Negative for dizziness, extremity weakness, headaches and numbness.  Hematological: Negative for adenopathy. Does not bruise/bleed easily.  Psychiatric/Behavioral: Negative for depression. The patient is not nervous/anxious.      PAST MEDICAL/SURGICAL HISTORY:  Past Medical History:  Diagnosis Date  . Arthritis   . Back pain   . Breast cancer ([place]) 2012  . Cancer ([place])    colon  . Diabetes mellitus   . Family history of breast cancer   . Family history of colon cancer   . GERD (gastroesophageal reflux disease)   . Hyperlipidemia   . Hypertension   . Joint pain   . Kidney problem   . Lactose intolerance   . Neuromuscular disorder ([place])    back, hip, leg left side  . Obesity   . Personal history of radiation therapy 2012  . Sleep apnea   . Spinal stenosis    Past Surgical History:  Procedure Laterality Date  . ABDOMINAL HYSTERECTOMY    . BREAST EXCISIONAL BIOPSY Left 1972  . BREAST  LUMPECTOMY Right 2012  . BREAST SURGERY     biopsy  . COLON SURGERY     partial colectomy  . X-STOP IMPLANTATION  September 26th,2012     ALLERGIES:  Allergies  Allergen Reactions  . Mobic [Meloxicam] Shortness Of Breath    "Wheezing"   . Naproxen Sodium Swelling    Hands and feet  . Tape     Surgical tape adhesive and paper tape   . Doxycycline Itching and Rash     CURRENT MEDICATIONS:  Outpatient Encounter  Medications as of 12/23/2018  Medication Sig  . acetaminophen (TYLENOL) 500 MG tablet Take 500 mg by mouth every 6 (six) hours as needed. 1-2 tablets q 4-6 hrs          . aspirin 81 MG tablet Take 81 mg by mouth daily.  . betamethasone dipropionate (DIPROLENE) 0.05 % cream Apply topically 1 day or 1 dose.    . bisoprolol (ZEBETA) 5 MG tablet   . chlorthalidone (HYGROTON) 50 MG tablet Take 1 tablet (50 mg total) by mouth daily.  . fexofenadine (ALLEGRA) 180 MG tablet Take 180 mg by mouth daily.   Marland Kitchen FIBER ADULT GUMMIES 2 g CHEW Chew 2 tablets by mouth daily.  . Glucosamine HCl 1500 MG TABS Take 2 tablets by mouth daily.  Marland Kitchen glucose blood test strip 1 each by Other route as needed for other. Use as instructed  . ibuprofen (ADVIL,MOTRIN) 200 MG tablet Take 200 mg by mouth every 6 (six) hours as needed.    Kathleen [name] Oil 300 MG CAPS Take by mouth daily.    . metFORMIN (GLUCOPHAGE) 500 MG tablet Take 1 tablet (500 mg total) by mouth daily with breakfast.  . omeprazole (PRILOSEC) 20 MG capsule Take 20 mg by mouth daily.    Marland Kitchen omeprazole-sodium bicarbonate (ZEGERID) 40-1100 MG per capsule Take 1 capsule by mouth daily before breakfast.    . Probiotic Product (PROBIOTIC DAILY PO) Take by mouth.  . simvastatin (ZOCOR) 40 MG tablet Take 1 tablet (40 mg total) by mouth at bedtime.  . valsartan (DIOVAN) 80 MG tablet Take 1 tablet (80 mg total) by mouth 2 (two) times daily.  . Vitamin D, Ergocalciferol, (DRISDOL) 1.25 MG (50000 UT) CAPS capsule Take 1 capsule (50,000 Units total) by mouth every 7 (seven) days.  . [DISCONTINUED] chlorthalidone (HYGROTON) 50 MG tablet Take 1 tablet (50 mg total) by mouth daily.  . [DISCONTINUED] metFORMIN (GLUCOPHAGE) 500 MG tablet Take 1 tablet (500 mg total) by mouth daily with breakfast.  . [DISCONTINUED] metFORMIN (GLUCOPHAGE) 500 MG tablet Take 1 tablet (500 mg total) by mouth daily with breakfast.  . [DISCONTINUED] simvastatin (ZOCOR) 40 MG tablet Take 40 mg by mouth at  bedtime.    . [DISCONTINUED] valsartan (DIOVAN) 80 MG tablet Take 1 tablet (80 mg total) by mouth daily.  . [DISCONTINUED] valsartan (DIOVAN) 80 MG tablet Take 1 tablet (80 mg total) by mouth 2 (two) times daily.  . [DISCONTINUED] Vitamin D, Ergocalciferol, (DRISDOL) 1.25 MG (50000 UT) CAPS capsule Take 1 capsule (50,000 Units total) by mouth every 7 (seven) days.   No facility-administered encounter medications on file as of 12/23/2018.      ONCOLOGIC FAMILY HISTORY:  Family History  Problem Relation Age of Onset  . Breast cancer Mother        dx late 4's  . Hyperlipidemia Mother   . Heart disease Mother   . Liver disease Mother   . Breast cancer Sister 50  .  Heart disease Father   . Obesity Father   . Hypertension Father   . Diabetes Father   . Heart disease Maternal Grandmother   . Colon cancer Cousin        dx 19+  . Kidney cancer Son 43    GENETIC COUNSELING/TESTING:  Genetic testing performed through [facility] Common Hereditary Cancers Panel + Renal/Urinary tract cancer panel reported out on 01/07/2018 showed no pathogenic mutations. The following genes were evaluated for sequence changes and exonic deletions/duplications:APC, ATM, AXIN2, BAP1, BARD1, BLM, BMPR1A, BRCA1, BRCA2, BRIP1, BUB1B, CDC73, CDH1, CDK4, CDKN1C, CDKN2A (p14ARF),CDKN2A (p16INK4a), CEP57, CHEK2, CTNNA1, DICER1, DIS3L2, ENG, EPCAM*, FH, FLCN, GALNT12, GPC3, GREM1*, HOXB13,KIT, MEN1, MET, MLH1, MLH3, MSH2, MSH3, MSH6, MUTYH, NBN, NF1, NTHL1, PALB2, PDGFRA, PMS2, POLD1, POLE, PTEN, RAD50, RAD51C, RAD51D, RNF43, RPS20, SDHB, SDHC, SDHD, SMAD4, SMARCA4, SMARCB1, STK11, TP53, TSC1, TSC2, VHL,WT1The following genes were evaluated for sequence changes only:SDHA.  A variant of uncertain significance (VUS) in a gene called VHL was also noted. c.629G>A (p.Arg210Gln)  SOCIAL HISTORY:  Social History   Socioeconomic History  . Marital status: Married    Spouse name: Kathleen [name]  . Number of children: Not on  file  . Years of education: Not on file  . Highest education level: Not on file  Occupational History  . Not on file  Social Needs  . Financial resource strain: Not on file  . Food insecurity    Worry: Not on file    Inability: Not on file  . Transportation needs    Medical: Not on file    Non-medical: Not on file  Tobacco Use  . Smoking status: Former Research scientist (life sciences)  . Smokeless tobacco: Never Used  Substance and Sexual Activity  . Alcohol use: Yes  . Drug use: No  . Sexual activity: Yes  Lifestyle  . Physical activity    Days per week: Not on file    Minutes per session: Not on file  . Stress: Not on file  Relationships  . Social Herbalist on phone: Not on file    Gets together: Not on file    Attends religious service: Not on file    Active member of club or organization: Not on file    Attends meetings of clubs or organizations: Not on file    Relationship status: Not on file  . Intimate partner violence    Fear of current or ex partner: Not on file    Emotionally abused: Not on file    Physically abused: Not on file    Forced sexual activity: Not on file  Other Topics Concern  . Not on file  Social History Narrative  . Not on file      OBJECTIVE:  Patient appears well.  She is in no apparent distress.  Mood and behavior are normal.  Skin is without rash or lesion.  Breathing is non labored.  LABORATORY DATA:  None for this visit   DIAGNOSTIC IMAGING:  Most recent mammogram:     ASSESSMENT AND PLAN:  Ms.. Casella is a pleasant 78 y.o. female with history of Stage IA right breast invasive ductal carcinoma, ER+/PR+/HER2-, diagnosed in 2012, treated with lumpectomy, adjuvant radiation therapy, and anti-estrogen therapy with Tamoxifen x 5 years completing therapy in 03/2016.  [facility] and routine follow-up.   1. History of breast cancer:  Ms. Raybon is currently clinically and radiographically without evidence  of disease or recurrence of breast  cancer. She will be due for mammogram in 12/2019.   She will return in 1 year for LTS follow up.  I encouraged her to call me with any questions or concerns before her next visit at the cancer center, and I would be happy to see her sooner, if needed.    2. Weight loss: Patient has lost 20 pounds.  I congratulated her on this and encouraged her to keep working towards her goal.    3. Bone health:  Given Ms. Deyarmin's age, history of breast cancer, she is at risk for bone demineralization. She is undergoing bone density testing in 02/2019 as per her PCP.  She was given education on specific food and activities to promote bone health.  4. Cancer screening:  Due to Ms. Isenberg's history and her age, she should receive screening for skin cancers, colon cancer. She was encouraged to follow-up with her PCP for appropriate cancer screenings.   5. Health maintenance and wellness promotion: Ms. Comes was encouraged to consume 5-7 servings of fruits and vegetables per day. She was also encouraged to engage in moderate to vigorous exercise for 30 minutes per day most days of the week. She was instructed to limit her alcohol consumption and continue to abstain from tobacco use.    Follow up instructions:    -Return to cancer center in one year for LTS follow up  -Mammogram due in 12/2019   The patient was provided an opportunity to ask questions and all were answered. The patient agreed with the plan and demonstrated an understanding of the instructions.   The patient was advised to call back or seek an in-person evaluation if the symptoms worsen or if the condition fails to improve as anticipated.   I provided 15 minutes of face-to-face video visit time during this encounter, and > 50% was spent counseling as documented under my assessment & plan.   Gardenia Phlegm, [place] [phone]   Note: PRIMARY CARE PROVIDER  [name], [place] [phone]

## 2018-12-25 ENCOUNTER — Other Ambulatory Visit (INDEPENDENT_AMBULATORY_CARE_PROVIDER_SITE_OTHER): Payer: Self-pay | Admitting: Physician Assistant

## 2018-12-25 DIAGNOSIS — E119 Type 2 diabetes mellitus without complications: Secondary | ICD-10-CM

## 2018-12-27 ENCOUNTER — Telehealth (INDEPENDENT_AMBULATORY_CARE_PROVIDER_SITE_OTHER): Payer: Medicare Other | Admitting: Physician Assistant

## 2018-12-27 ENCOUNTER — Encounter (INDEPENDENT_AMBULATORY_CARE_PROVIDER_SITE_OTHER): Payer: Self-pay | Admitting: Physician Assistant

## 2018-12-27 ENCOUNTER — Other Ambulatory Visit: Payer: Self-pay

## 2018-12-27 DIAGNOSIS — E119 Type 2 diabetes mellitus without complications: Secondary | ICD-10-CM

## 2018-12-27 DIAGNOSIS — Z6841 Body Mass Index (BMI) 40.0 and over, adult: Secondary | ICD-10-CM

## 2018-12-27 DIAGNOSIS — I10 Essential (primary) hypertension: Secondary | ICD-10-CM

## 2018-12-27 DIAGNOSIS — E559 Vitamin D deficiency, unspecified: Secondary | ICD-10-CM

## 2018-12-27 MED ORDER — METFORMIN HCL 500 MG PO TABS: 500.0000 mg | ORAL_TABLET | Freq: Every day | ORAL | 1 refills | Status: DC

## 2018-12-27 MED ORDER — VITAMIN D (ERGOCALCIFEROL) 1.25 MG (50000 UNIT) PO CAPS: 50000.0000 [IU] | ORAL_CAPSULE | ORAL | 0 refills | Status: DC

## 2018-12-27 MED ORDER — VALSARTAN 80 MG PO TABS: 80.0000 mg | ORAL_TABLET | Freq: Two times a day (BID) | ORAL | 0 refills | Status: DC

## 2018-12-27 NOTE — Progress Notes (Signed)
Office: [phone]  /  Fax: [phone] TeleHealth Visit:  Kathleen [name] has verbally consented to this TeleHealth visit today. The patient is located at home, the provider is located at the [facility] office. The participants in this visit include the listed provider and patient. The visit was conducted today via FaceTime.  HPI:   Chief Complaint: OBESITY [name] is here to discuss her progress with her obesity treatment plan. She is on the Category 3 plan and is following her eating plan approximately 75% of the time. She states she is exercising 0 minutes 0 times per week. [name] reports her most recent weight to be 240 lbs. She reports eating at [place] and [place] [employer] the last 2 weeks. She wants to start journaling. We were unable to weigh the patient today for this TeleHealth visit. She feels as if she has maintained her weight since her last visit. She has lost 16 lbs since starting treatment with [name].  Vitamin D deficiency [name] has a diagnosis of Vitamin D deficiency. She is currently taking prescription Vit D weekly and denies nausea, vomiting or muscle weakness.  Diabetes II [name] has a diagnosis of diabetes type II and is on metformin. [name] states fasting blood sugars range between 122 and 146. Last A1c was 6.2 on 08/24/2018. She has been working on intensive lifestyle modifications including diet, exercise, and weight loss to help control her blood glucose levels.  Hypertension [name] is a 78 y.o. female with hypertension and is on valsartan, chlorthalidone, and metoprolol.  [name] B Tindel denies chest pain or headache. She is working weight loss to help control her blood pressure with the goal of decreasing her risk of heart attack and stroke.  ASSESSMENT AND PLAN:  Vitamin D deficiency - Plan: Vitamin D, Ergocalciferol, (DRISDOL) 1.25 MG (50000 UT) CAPS capsule  Type 2 diabetes mellitus without complication, without  long-term current use of insulin (HCC) - Plan: metFORMIN (GLUCOPHAGE) 500 MG tablet  Class 3 severe obesity with serious comorbidity and body mass index (BMI) of 40.0 to 44.9 in adult, unspecified obesity type ([place])  Essential hypertension - Plan: valsartan (DIOVAN) 80 MG tablet  PLAN:  Vitamin D Deficiency [name] was informed that low Vitamin D levels contributes to fatigue and are associated with obesity, breast, and colon cancer. She agrees to continue to take prescription Vit D @ 50,000 IU every week #4 with 0 refills and will follow-up for routine testing of Vitamin D, at least 2-3 times per year. She was informed of the risk of over-replacement of Vitamin D and agrees to not increase her dose unless she discusses this with [name]. [name] agrees to follow-up with our clinic in 2 weeks.  Diabetes II [name] has been given extensive diabetes education by myself today including ideal fasting and post-prandial blood glucose readings, individual ideal HgA1c goals  and hypoglycemia prevention. We discussed the importance of good blood sugar control to decrease the likelihood of diabetic complications such as nephropathy, neuropathy, limb loss, blindness, coronary artery disease, and death. We discussed the importance of intensive lifestyle modification including diet, exercise and weight loss as the first line treatment for diabetes. [name] was given a refill on her metformin #30 with 1 refill and agrees to follow-up with our clinic in 2 weeks.  Hypertension We discussed sodium restriction, working on healthy weight loss, and a regular exercise program as the means to achieve improved blood pressure control. [name] agreed with this plan and agreed to follow  up as directed. We will continue to monitor her blood pressure as well as her progress with the above lifestyle modifications. [name] was given a refill on her valsartan #60 with 1 refill and agrees to follow-up with our clinic in 2  weeks. She will watch for signs of hypotension as she continues her lifestyle modifications.  Obesity [name] is currently in the action stage of change. As such, her goal is to continue with weight loss efforts. She has agreed to keep a food journal with 1400-1500 calories and 95 grams of protein daily. [name] has been instructed to work up to a goal of 150 minutes of combined cardio and strengthening exercise per week for weight loss and overall health benefits. We discussed the following Behavioral Modification Strategies today: work on meal planning and easy cooking plans.  [name] has agreed to follow-up with our clinic in 2 weeks. She was informed of the importance of frequent follow-up visits to maximize her success with intensive lifestyle modifications for her multiple health conditions.  ALLERGIES: Allergies  Allergen Reactions   Mobic [Meloxicam] Shortness Of Breath    "Wheezing"    Naproxen Sodium Swelling    Hands and feet   Tape     Surgical tape adhesive and paper tape    Doxycycline Itching and Rash    MEDICATIONS: Current Outpatient Medications on File Prior to Visit  Medication Sig Dispense Refill   acetaminophen (TYLENOL) 500 MG tablet Take 500 mg by mouth every 6 (six) hours as needed. 1-2 tablets q 4-6 hrs             aspirin 81 MG tablet Take 81 mg by mouth daily.     betamethasone dipropionate (DIPROLENE) 0.05 % cream Apply topically 1 day or 1 dose.       bisoprolol (ZEBETA) 5 MG tablet      chlorthalidone (HYGROTON) 50 MG tablet Take 1 tablet (50 mg total) by mouth daily. 90 tablet 0   fexofenadine (ALLEGRA) 180 MG tablet Take 180 mg by mouth daily.      FIBER ADULT GUMMIES 2 g CHEW Chew 2 tablets by mouth daily.     Glucosamine HCl 1500 MG TABS Take 2 tablets by mouth daily.     glucose blood test strip 1 each by Other route as needed for other. Use as instructed     ibuprofen (ADVIL,MOTRIN) 200 MG tablet Take 200 mg by mouth  every 6 (six) hours as needed.       Krill Oil 300 MG CAPS Take by mouth daily.       omeprazole (PRILOSEC) 20 MG capsule Take 20 mg by mouth daily.       omeprazole-sodium bicarbonate (ZEGERID) 40-1100 MG per capsule Take 1 capsule by mouth daily before breakfast.       Probiotic Product (PROBIOTIC DAILY PO) Take by mouth.     simvastatin (ZOCOR) 40 MG tablet Take 1 tablet (40 mg total) by mouth at bedtime. 90 tablet 0   No current facility-administered medications on file prior to visit.     PAST MEDICAL HISTORY: Past Medical History:  Diagnosis Date   Arthritis    Back pain    Breast cancer ([place]) 2012   Cancer [facility])    colon   Diabetes mellitus    Family history of breast cancer    Family history of colon cancer    GERD (gastroesophageal reflux disease)    Hyperlipidemia    Hypertension    Joint pain  Kidney problem    Lactose intolerance    Neuromuscular disorder ([facility])    back, hip, leg left side   Obesity    Personal history of radiation therapy 2012   Sleep apnea    Spinal stenosis     PAST SURGICAL HISTORY: Past Surgical History:  Procedure Laterality Date   ABDOMINAL HYSTERECTOMY     BREAST EXCISIONAL BIOPSY Left 1972   BREAST LUMPECTOMY Right 2012   BREAST SURGERY     biopsy   COLON SURGERY     partial colectomy   X-STOP IMPLANTATION  September 26th,2012    SOCIAL HISTORY: Social History   Tobacco Use   Smoking status: Former Smoker   Smokeless tobacco: Never Used  Substance Use Topics   Alcohol use: Yes   Drug use: No    FAMILY HISTORY: Family History  Problem Relation Age of Onset   Breast cancer Mother        dx late 80's   Hyperlipidemia Mother    Heart disease Mother    Liver disease Mother    Breast cancer Sister 53   Heart disease Father    Obesity Father    Hypertension Father    Diabetes Father    Heart disease Maternal Grandmother    Colon cancer Cousin        dx 70+   Kidney  cancer Son 31   Breast cancer Sister    ROS: Review of Systems  Cardiovascular: Negative for chest pain.  Gastrointestinal: Negative for diarrhea, nausea and vomiting.  Musculoskeletal:       Negative for muscle weakness.  Neurological: Negative for headaches.  Endo/Heme/Allergies:       Negative for polyphagia.   PHYSICAL EXAM: Pt in no acute distress  RECENT LABS AND TESTS: BMET    Component Value Date/Time   NA 141 08/24/2018 1221   NA 141 12/14/2014 0747   K 4.4 08/24/2018 1221   K 4.2 12/14/2014 0747   CL 100 08/24/2018 1221   CO2 24 08/24/2018 1221   CO2 28 12/14/2014 0747   GLUCOSE 124 (H) 08/24/2018 1221   GLUCOSE 120 12/14/2014 0747   BUN 30 (H) 08/24/2018 1221   BUN 21.3 12/14/2014 0747   CREATININE 0.87 08/24/2018 1221   CREATININE 0.9 12/14/2014 0747   CALCIUM 9.5 08/24/2018 1221   CALCIUM 9.4 12/14/2014 0747   GFRNONAA 65 08/24/2018 1221   GFRAA 75 08/24/2018 1221   Lab Results  Component Value Date   HGBA1C 6.2 (H) 08/24/2018   HGBA1C 6.8 (H) 04/12/2018   Lab Results  Component Value Date   INSULIN 24.9 08/24/2018   INSULIN 30.6 (H) 04/12/2018   CBC    Component Value Date/Time   WBC 7.1 04/12/2018 1051   WBC 8.0 12/14/2014 0746   WBC 6.1 03/04/2011 1040   RBC 4.27 04/12/2018 1051   RBC 4.17 12/14/2014 0746   RBC 3.99 03/04/2011 1040   HGB 12.8 04/12/2018 1051   HGB 12.4 12/14/2014 0746   HCT 37.8 04/12/2018 1051   HCT 37.0 12/14/2014 0746   PLT 230 12/14/2014 0746   MCV 89 04/12/2018 1051   MCV 88.9 12/14/2014 0746   MCH 30.0 04/12/2018 1051   MCH 29.8 12/14/2014 0746   MCH 30.1 03/04/2011 1040   MCHC 33.9 04/12/2018 1051   MCHC 33.5 12/14/2014 0746   MCHC 33.1 03/04/2011 1040   RDW 12.4 04/12/2018 1051   RDW 12.5 12/14/2014 0746   LYMPHSABS 2.0 04/12/2018 1051   LYMPHSABS  1.8 12/14/2014 0746   MONOABS 0.5 12/14/2014 0746   EOSABS 0.2 04/12/2018 1051   BASOSABS 0.0 04/12/2018 1051   BASOSABS 0.1 12/14/2014 0746    Iron/TIBC/Ferritin/ %Sat No results found for: IRON, TIBC, FERRITIN, IRONPCTSAT Lipid Panel     Component Value Date/Time   CHOL 155 04/12/2018 1051   TRIG 123 04/12/2018 1051   HDL 60 04/12/2018 1051   LDLCALC 70 04/12/2018 1051   Hepatic Function Panel     Component Value Date/Time   PROT 7.2 08/24/2018 1221   PROT 7.1 12/14/2014 0747   ALBUMIN 4.1 08/24/2018 1221   ALBUMIN 3.8 12/14/2014 0747   AST 14 08/24/2018 1221   AST 16 12/14/2014 0747   ALT 16 08/24/2018 1221   ALT 15 12/14/2014 0747   ALKPHOS 70 08/24/2018 1221   ALKPHOS 52 12/14/2014 0747   BILITOT 0.3 08/24/2018 1221   BILITOT 0.40 12/14/2014 0747      Component Value Date/Time   TSH 1.480 04/12/2018 1051   Results for LASHARN, BUFKIN (MRN [id number]) as of 12/27/2018 15:28  Ref. Range 08/24/2018 12:21  Vitamin D, 25-Hydroxy Latest Ref Range: 30.0 - 100.0 ng/mL 46.3   I, Michaelene Song, am acting as Location manager for [employer], PA-C I, Abby Potash, PA-C have reviewed above note and agree with its content

## 2019-01-07 ENCOUNTER — Emergency Department (HOSPITAL_COMMUNITY)
Admission: EM | Admit: 2019-01-07 | Discharge: 2019-01-07 | Disposition: A | Discharge status: Home / Self Care | Payer: Medicare Other | Attending: Emergency Medicine | Admitting: Emergency Medicine

## 2019-01-07 ENCOUNTER — Other Ambulatory Visit: Payer: Self-pay

## 2019-01-07 ENCOUNTER — Encounter (HOSPITAL_COMMUNITY): Payer: Self-pay | Admitting: *Deleted

## 2019-01-07 DIAGNOSIS — Z79899 Other long term (current) drug therapy: Secondary | ICD-10-CM | POA: Diagnosis not present

## 2019-01-07 DIAGNOSIS — I10 Essential (primary) hypertension: Secondary | ICD-10-CM | POA: Insufficient documentation

## 2019-01-07 DIAGNOSIS — Z87891 Personal history of nicotine dependence: Secondary | ICD-10-CM | POA: Insufficient documentation

## 2019-01-07 DIAGNOSIS — Z7982 Long term (current) use of aspirin: Secondary | ICD-10-CM | POA: Insufficient documentation

## 2019-01-07 DIAGNOSIS — E119 Type 2 diabetes mellitus without complications: Secondary | ICD-10-CM | POA: Insufficient documentation

## 2019-01-07 DIAGNOSIS — Z20822 Contact with and (suspected) exposure to covid-19: Secondary | ICD-10-CM

## 2019-01-07 DIAGNOSIS — R197 Diarrhea, unspecified: Secondary | ICD-10-CM | POA: Insufficient documentation

## 2019-01-07 DIAGNOSIS — Z20828 Contact with and (suspected) exposure to other viral communicable diseases: Secondary | ICD-10-CM

## 2019-01-07 NOTE — ED Triage Notes (Signed)
Pt wants COVID testing d/t husband showing symptoms of COVID 46. Pt denies symptoms at this time.

## 2019-01-07 NOTE — ED Provider Notes (Signed)
[place] DEPT Provider Note   CSN: [id number] Arrival date & time: 01/07/19  1706    History   Chief Complaint Chief Complaint  Patient presents with  . Wants COVID 19 test    HPI Kathleen [name] is a 78 y.o. female.     HPI   Patient is a 78 year old female with a history of arthritis, breast cancer, colon cancer, diabetes, GERD, upper lipidemia, hypertension, who presents the emergency department today for COVID testing.  Patient is asymptomatic at this time however her husband developed a fever today and she is concerned she may have the coronavirus.  She denies fevers, rhinorrhea, congestion, sore throat, cough, chest pain, shortness of breath, abdominal pain, nausea, vomiting.  She does have some mild diarrhea.  She had about 2-3 episodes today.  It was nonbloody.  Denies any urinary symptoms.  Past Medical History:  Diagnosis Date  . Arthritis   . Back pain   . Breast cancer ([place]) 2012  . Cancer ([place])    colon  . Diabetes mellitus   . Family history of breast cancer   . Family history of colon cancer   . GERD (gastroesophageal reflux disease)   . Hyperlipidemia   . Hypertension   . Joint pain   . Kidney problem   . Lactose intolerance   . Neuromuscular disorder ([place])    back, hip, leg left side  . Obesity   . Personal history of radiation therapy 2012  . Sleep apnea   . Spinal stenosis     Patient Active Problem List   Diagnosis Date Noted  . Genetic testing 01/18/2018  . Family history of breast cancer   . Family history of colon cancer   . Breast cancer of upper-outer quadrant of right female breast ([place]) 12/25/2010  . Colon cancer ([place]) 02/18/1999    Past Surgical History:  Procedure Laterality Date  . ABDOMINAL HYSTERECTOMY    . BREAST EXCISIONAL BIOPSY Left 1972  . BREAST LUMPECTOMY Right 2012  . BREAST SURGERY     biopsy  . COLON SURGERY     partial colectomy  . X-STOP IMPLANTATION  September 26th,2012      OB History    Gravida  2   Para  2   Term      Preterm      AB      Living  2     SAB      TAB      Ectopic      Multiple      Live Births               Home Medications    Prior to Admission medications   Medication Sig Start Date End Date Taking? Authorizing Provider  acetaminophen (TYLENOL) 500 MG tablet Take 500 mg by mouth every 6 (six) hours as needed. 1-2 tablets q 4-6 hrs            [provider]  aspirin 81 MG tablet Take 81 mg by mouth daily.    [provider]  betamethasone dipropionate (DIPROLENE) 0.05 % cream Apply topically 1 day or 1 dose.      [provider]  bisoprolol (ZEBETA) 5 MG tablet  04/16/11   [provider]  chlorthalidone (HYGROTON) 50 MG tablet Take 1 tablet (50 mg total) by mouth daily. 11/25/18   Abby Potash, PA-C  fexofenadine (ALLEGRA) 180 MG tablet Take 180 mg by mouth daily.  [provider]  FIBER ADULT GUMMIES 2 g CHEW Chew 2 tablets by mouth daily. 12/18/16   Nicholas Lose, MD  Glucosamine HCl 1500 MG TABS Take 2 tablets by mouth daily.    [provider]  glucose blood test strip 1 each by Other route as needed for other. Use as instructed    [provider]  ibuprofen (ADVIL,MOTRIN) 200 MG tablet Take 200 mg by mouth every 6 (six) hours as needed.      [provider]  Astrid Drafts 300 MG CAPS Take by mouth daily.      [provider]  metFORMIN (GLUCOPHAGE) 500 MG tablet Take 1 tablet (500 mg total) by mouth daily with breakfast. 12/27/18   Abby Potash, PA-C  omeprazole (PRILOSEC) 20 MG capsule Take 20 mg by mouth daily.      [provider]  omeprazole-sodium bicarbonate (ZEGERID) 40-1100 MG per capsule Take 1 capsule by mouth daily before breakfast.      [provider]  Probiotic Product (PROBIOTIC DAILY PO) Take by mouth.    [provider]  simvastatin (ZOCOR) 40 MG tablet Take 1 tablet (40 mg  total) by mouth at bedtime. 11/25/18   Abby Potash, PA-C  valsartan (DIOVAN) 80 MG tablet Take 1 tablet (80 mg total) by mouth 2 (two) times daily. 12/27/18   Abby Potash, PA-C  Vitamin D, Ergocalciferol, (DRISDOL) 1.25 MG (50000 UT) CAPS capsule Take 1 capsule (50,000 Units total) by mouth every 7 (seven) days. 12/27/18   Abby Potash, PA-C    Family History Family History  Problem Relation Age of Onset  . Breast cancer Mother        dx late 61's  . Hyperlipidemia Mother   . Heart disease Mother   . Liver disease Mother   . Breast cancer Sister 9  . Heart disease Father   . Obesity Father   . Hypertension Father   . Diabetes Father   . Heart disease Maternal Grandmother   . Colon cancer Cousin        dx 97+  . Kidney cancer Son 12  . Breast cancer Sister     Social History Social History   Tobacco Use  . Smoking status: Former Research scientist (life sciences)  . Smokeless tobacco: Never Used  Substance Use Topics  . Alcohol use: Yes  . Drug use: No     Allergies   Mobic [meloxicam], Naproxen sodium, Tape, and Doxycycline   Review of Systems Review of Systems  Constitutional: Negative for chills and fever.  HENT: Negative for congestion, ear pain, rhinorrhea and sore throat.   Eyes: Negative for visual disturbance.  Respiratory: Negative for cough and shortness of breath.   Cardiovascular: Negative for chest pain.  Gastrointestinal: Positive for diarrhea. Negative for abdominal pain, blood in stool, constipation, nausea and vomiting.  Genitourinary: Negative for dysuria, frequency, hematuria and urgency.  Musculoskeletal: Negative for back pain.  Skin: Negative for rash.  Neurological: Negative for dizziness, weakness, light-headedness, numbness and headaches.  All other systems reviewed and are negative.    Physical Exam Updated Vital Signs BP (!) 152/59   Pulse 65   Temp 98.3 F (36.8 C) (Oral)   Resp 18   SpO2 95%   Physical Exam Vitals signs and nursing note  reviewed.  Constitutional:      General: She is not in acute distress.    Appearance: She is well-developed.  HENT:     Head: Normocephalic and atraumatic.  Eyes:  Conjunctiva/sclera: Conjunctivae normal.  Neck:     Musculoskeletal: Neck supple.  Cardiovascular:     Rate and Rhythm: Normal rate and regular rhythm.     Pulses: Normal pulses.     Heart sounds: Normal heart sounds. No murmur.  Pulmonary:     Effort: Pulmonary effort is normal. No respiratory distress.     Breath sounds: Normal breath sounds. No wheezing, rhonchi or rales.  Abdominal:     General: Bowel sounds are normal. There is no distension.     Palpations: Abdomen is soft.     Tenderness: There is no abdominal tenderness. There is no guarding or rebound.  Skin:    General: Skin is warm and dry.  Neurological:     Mental Status: She is alert.    ED Treatments / Results  Labs (all labs ordered are listed, but only abnormal results are displayed) Labs Reviewed  NOVEL CORONAVIRUS, NAA (HOSPITAL ORDER, SEND-OUT TO REF LAB)    EKG None  Radiology No results found.  Procedures Procedures (including critical care time)  Medications Ordered in ED Medications - No data to display   Initial Impression / Assessment and Plan / ED Course  I have reviewed the triage vital signs and the nursing notes.  Pertinent labs & imaging results that were available during my care of the patient were reviewed by me and considered in my medical decision making (see chart for details).      Final Clinical Impressions(s) / ED Diagnoses   Final diagnoses:  Close Exposure to 2019 Novel Coronavirus   78 year old female presenting requesting coronavirus testing after her husband was noted to have a fever today.  She is asymptomatic.  On exam she is nontoxic, nonseptic appearing.  Vitals are reassuring.  Exam is reassuring.  We will test her for the coronavirus.  Advise follow-up with her PCP and return to the ER if she  has any new or worsening symptoms.  She voices understanding and is in agreement plan.  All questions answered.  Patient stable discharge.  ------   Kathleen [name] was evaluated in Emergency Department on 01/07/2019 for the symptoms described in the history of present illness. She was evaluated in the context of the global COVID-19 pandemic, which necessitated consideration that the patient might be at risk for infection with the SARS-CoV-2 virus that causes COVID-19. Institutional protocols and algorithms that pertain to the evaluation of patients at risk for COVID-19 are in a state of rapid change based on information released by regulatory bodies including the CDC and federal and state organizations. These policies and algorithms were followed during the patient's care in the ED.   ED Discharge Orders    None       Bishop Dublin 01/07/19 2338    Sherwood Gambler, MD 01/08/19 [phone]

## 2019-01-07 NOTE — Discharge Instructions (Addendum)
You should be isolated for at least 7 days since the onset of your symptoms AND >72 hours after symptoms resolution (absence of fever without the use of fever reducing medication and improvement in respiratory symptoms), whichever is longer  Please follow up with your primary care provider within 5-7 days for re-evaluation of your symptoms. If you do not have a primary care provider, information for a healthcare clinic has been provided for you to make arrangements for follow up care. Please return to the emergency department for any new or worsening symptoms.

## 2019-01-08 LAB — NOVEL CORONAVIRUS, NAA (HOSP ORDER, SEND-OUT TO REF LAB; TAT 18-24 HRS): SARS-CoV-2, NAA: NOT DETECTED

## 2019-01-11 ENCOUNTER — Other Ambulatory Visit: Payer: Self-pay

## 2019-01-11 ENCOUNTER — Encounter (INDEPENDENT_AMBULATORY_CARE_PROVIDER_SITE_OTHER): Payer: Self-pay | Admitting: Physician Assistant

## 2019-01-11 ENCOUNTER — Ambulatory Visit (INDEPENDENT_AMBULATORY_CARE_PROVIDER_SITE_OTHER): Payer: Medicare Other | Admitting: Physician Assistant

## 2019-01-11 VITALS — BP 154/68 | HR 58 | Temp 97.9°F | Ht 64.0 in | Wt 235.0 lb

## 2019-01-11 DIAGNOSIS — E7849 Other hyperlipidemia: Secondary | ICD-10-CM

## 2019-01-11 DIAGNOSIS — E559 Vitamin D deficiency, unspecified: Secondary | ICD-10-CM | POA: Diagnosis not present

## 2019-01-11 DIAGNOSIS — Z6841 Body Mass Index (BMI) 40.0 and over, adult: Secondary | ICD-10-CM | POA: Diagnosis not present

## 2019-01-11 DIAGNOSIS — E119 Type 2 diabetes mellitus without complications: Secondary | ICD-10-CM

## 2019-01-11 NOTE — Progress Notes (Signed)
Office: [phone]  /  Fax: [phone]   HPI:   Chief Complaint: Kathleen [name] is here to discuss her progress with her obesity treatment plan. She is on the Category 3 plan and is following her eating plan approximately 100% of the time. She states she is exercising 0 minutes 0 times per week. Kathleen [name] reports that she is enjoying Research officer, trade union. It gives her the freedom with food she is looking for. She is doing a great job reaching her protein goals daily. Her weight is 235 lb (106.6 kg) today and has had a weight loss of 8 pounds over a period of 4 months since her last in-office visit. She has lost 24 lbs since starting treatment with [name].  Vitamin D deficiency [name] has a diagnosis of Vitamin D deficiency. She is currently taking Vit D and denies nausea, vomiting or muscle weakness.  Diabetes II Kathleen [name] has a diagnosis of diabetes type II. Kathleen [name] states fasting blood sugars range between 117 and 141. She denies any hypoglycemic episodes. Last A1c was 6.2 on 08/24/2018. She has been working on intensive lifestyle modifications including diet, exercise, and weight loss to help control her blood glucose levels. No nausea, vomiting, diarrhea, or polyphagia.  Hyperlipidemia Kathleen [name] has hyperlipidemia and has been trying to improve her cholesterol levels with intensive lifestyle modification including a low saturated fat diet, exercise and weight loss. She is on simvastatin and denies any chest pain.   ASSESSMENT AND PLAN:  Vitamin D deficiency - Plan: VITAMIN D 25 Hydroxy (Vit-D Deficiency, Fractures)  Type 2 diabetes mellitus without complication, without long-term current use of insulin (HCC) - Plan: Comprehensive metabolic panel, Hemoglobin A1c, Insulin, random  Other hyperlipidemia - Plan: Lipid Panel With LDL/HDL Ratio  Class 3 severe obesity with serious comorbidity and body mass index (BMI) of 40.0 to 44.9 in adult, unspecified obesity type ([place])  PLAN:  Vitamin D  Deficiency Kathleen [name] was informed that low Vitamin D levels contributes to fatigue and are associated with obesity, breast, and colon cancer. She agrees to continue taking Vit D and will have routine testing of Vitamin D. She was informed of the risk of over-replacement of Vitamin D and agrees to not increase her dose unless she discusses this with [name]. Kathleen [name] agrees to follow-up with our clinic in 3 weeks.  Diabetes II Kathleen [name] has been given extensive diabetes education by myself today including ideal fasting and post-prandial blood glucose readings, individual ideal HgA1c goals  and hypoglycemia prevention. We discussed the importance of good blood sugar control to decrease the likelihood of diabetic complications such as nephropathy, neuropathy, limb loss, blindness, coronary artery disease, and death. We discussed the importance of intensive lifestyle modification including diet, exercise and weight loss as the first line treatment for diabetes. Kathleen [name] agrees to continue her diabetes medications, have labs checked, and will follow-up at the agreed upon time.  Hyperlipidemia Kathleen [name] was informed of the American Heart Association Guidelines emphasizing intensive lifestyle modifications as the first line treatment for hyperlipidemia. We discussed many lifestyle modifications today in depth, and Kathleen [name] will continue to work on decreasing saturated fats such as fatty red meat, butter and many fried foods. She will continue simvastatin, have labs checked, increase vegetables and lean protein in her diet, and continue to work on exercise and weight loss efforts.  Obesity Kathleen [name] is currently in the action stage of change. As such, her goal is to continue with weight loss efforts. She has agreed to follow the Category 3 plan with journaling  1500 calories + 95 grams of protein daily. Kathleen [name] has been instructed to work up to a goal of 150 minutes of combined cardio and strengthening exercise  per week for weight loss and overall health benefits. We discussed the following Behavioral Modification Strategies today: work on meal planning and easy cooking plans, and keeping healthy foods in the home.  Kathleen [name] has agreed to follow-up with our clinic in 3 weeks. She was informed of the importance of frequent follow-up visits to maximize her success with intensive lifestyle modifications for her multiple health conditions.  ALLERGIES: Allergies  Allergen Reactions   Mobic [Meloxicam] Shortness Of Breath    "Wheezing"    Naproxen Sodium Swelling    Hands and feet   Tape     Surgical tape adhesive and paper tape    Doxycycline Itching and Rash    MEDICATIONS: Current Outpatient Medications on File Prior to Visit  Medication Sig Dispense Refill   acetaminophen (TYLENOL) 500 MG tablet Take 500 mg by mouth every 6 (six) hours as needed. 1-2 tablets q 4-6 hrs             aspirin 81 MG tablet Take 81 mg by mouth daily.     betamethasone dipropionate (DIPROLENE) 0.05 % cream Apply topically 1 day or 1 dose.       bisoprolol (ZEBETA) 5 MG tablet      chlorthalidone (HYGROTON) 50 MG tablet Take 1 tablet (50 mg total) by mouth daily. 90 tablet 0   fexofenadine (ALLEGRA) 180 MG tablet Take 180 mg by mouth daily.      FIBER ADULT GUMMIES 2 g CHEW Chew 2 tablets by mouth daily.     Glucosamine HCl 1500 MG TABS Take 2 tablets by mouth daily.     glucose blood test strip 1 each by Other route as needed for other. Use as instructed     ibuprofen (ADVIL,MOTRIN) 200 MG tablet Take 200 mg by mouth every 6 (six) hours as needed.       Krill Oil 300 MG CAPS Take by mouth daily.       metFORMIN (GLUCOPHAGE) 500 MG tablet Take 1 tablet (500 mg total) by mouth daily with breakfast. 30 tablet 1   omeprazole (PRILOSEC) 20 MG capsule Take 20 mg by mouth daily.       omeprazole-sodium bicarbonate (ZEGERID) 40-1100 MG per capsule Take 1 capsule by mouth daily before breakfast.         Probiotic Product (PROBIOTIC DAILY PO) Take by mouth.     simvastatin (ZOCOR) 40 MG tablet Take 1 tablet (40 mg total) by mouth at bedtime. 90 tablet 0   valsartan (DIOVAN) 80 MG tablet Take 1 tablet (80 mg total) by mouth 2 (two) times daily. 180 tablet 0   Vitamin D, Ergocalciferol, (DRISDOL) 1.25 MG (50000 UT) CAPS capsule Take 1 capsule (50,000 Units total) by mouth every 7 (seven) days. 4 capsule 0   No current facility-administered medications on file prior to visit.     PAST MEDICAL HISTORY: Past Medical History:  Diagnosis Date   Arthritis    Back pain    Breast cancer ([place]) 2012   Cancer [facility])    colon   Diabetes mellitus    Family history of breast cancer    Family history of colon cancer    GERD (gastroesophageal reflux disease)    Hyperlipidemia    Hypertension    Joint pain    Kidney problem    Lactose intolerance  Neuromuscular disorder ([place])    back, hip, leg left side   Obesity    Personal history of radiation therapy 2012   Sleep apnea    Spinal stenosis     PAST SURGICAL HISTORY: Past Surgical History:  Procedure Laterality Date   ABDOMINAL HYSTERECTOMY     BREAST EXCISIONAL BIOPSY Left 1972   BREAST LUMPECTOMY Right 2012   BREAST SURGERY     biopsy   COLON SURGERY     partial colectomy   X-STOP IMPLANTATION  September 26th,2012    SOCIAL HISTORY: Social History   Tobacco Use   Smoking status: Former Smoker   Smokeless tobacco: Never Used  Substance Use Topics   Alcohol use: Yes   Drug use: No    FAMILY HISTORY: Family History  Problem Relation Age of Onset   Breast cancer Mother        dx late 75's   Hyperlipidemia Mother    Heart disease Mother    Liver disease Mother    Breast cancer Sister 84   Heart disease Father    Obesity Father    Hypertension Father    Diabetes Father    Heart disease Maternal Grandmother    Colon cancer Cousin        dx 70+   Kidney cancer Son 57    Breast cancer Sister    ROS: Review of Systems  Cardiovascular: Negative for chest pain.  Gastrointestinal: Negative for diarrhea, nausea and vomiting.  Musculoskeletal:       Negative for muscle weakness.  Endo/Heme/Allergies:       Negative for polyphagia. Negative for hypoglycemia.   PHYSICAL EXAM: Blood pressure (!) 154/68, pulse (!) 58, temperature 97.9 F (36.6 C), height 5\' 4"  (1.626 m), weight 235 lb (106.6 kg), SpO2 97 %. Body mass index is 40.34 kg/m. Physical Exam Vitals signs reviewed.  Constitutional:      Appearance: Normal appearance. She is obese.  Cardiovascular:     Rate and Rhythm: Normal rate.     Pulses: Normal pulses.  Pulmonary:     Effort: Pulmonary effort is normal.     Breath sounds: Normal breath sounds.  Musculoskeletal: Normal range of motion.  Skin:    General: Skin is warm and dry.  Neurological:     Mental Status: She is alert and oriented to person, place, and time.  Psychiatric:        Behavior: Behavior normal.   RECENT LABS AND TESTS: BMET    Component Value Date/Time   NA 141 08/24/2018 1221   NA 141 12/14/2014 0747   K 4.4 08/24/2018 1221   K 4.2 12/14/2014 0747   CL 100 08/24/2018 1221   CO2 24 08/24/2018 1221   CO2 28 12/14/2014 0747   GLUCOSE 124 (H) 08/24/2018 1221   GLUCOSE 120 12/14/2014 0747   BUN 30 (H) 08/24/2018 1221   BUN 21.3 12/14/2014 0747   CREATININE 0.87 08/24/2018 1221   CREATININE 0.9 12/14/2014 0747   CALCIUM 9.5 08/24/2018 1221   CALCIUM 9.4 12/14/2014 0747   GFRNONAA 65 08/24/2018 1221   GFRAA 75 08/24/2018 1221   Lab Results  Component Value Date   HGBA1C 6.2 (H) 08/24/2018   HGBA1C 6.8 (H) 04/12/2018   Lab Results  Component Value Date   INSULIN 24.9 08/24/2018   INSULIN 30.6 (H) 04/12/2018   CBC    Component Value Date/Time   WBC 7.1 04/12/2018 1051   WBC 8.0 12/14/2014 0746   WBC 6.1 03/04/2011  1040   RBC 4.27 04/12/2018 1051   RBC 4.17 12/14/2014 0746   RBC 3.99 03/04/2011 1040     HGB 12.8 04/12/2018 1051   HGB 12.4 12/14/2014 0746   HCT 37.8 04/12/2018 1051   HCT 37.0 12/14/2014 0746   PLT 230 12/14/2014 0746   MCV 89 04/12/2018 1051   MCV 88.9 12/14/2014 0746   MCH 30.0 04/12/2018 1051   MCH 29.8 12/14/2014 0746   MCH 30.1 03/04/2011 1040   MCHC 33.9 04/12/2018 1051   MCHC 33.5 12/14/2014 0746   MCHC 33.1 03/04/2011 1040   RDW 12.4 04/12/2018 1051   RDW 12.5 12/14/2014 0746   LYMPHSABS 2.0 04/12/2018 1051   LYMPHSABS 1.8 12/14/2014 0746   MONOABS 0.5 12/14/2014 0746   EOSABS 0.2 04/12/2018 1051   BASOSABS 0.0 04/12/2018 1051   BASOSABS 0.1 12/14/2014 0746   Iron/TIBC/Ferritin/ %Sat No results found for: IRON, TIBC, FERRITIN, IRONPCTSAT Lipid Panel     Component Value Date/Time   CHOL 155 04/12/2018 1051   TRIG 123 04/12/2018 1051   HDL 60 04/12/2018 1051   LDLCALC 70 04/12/2018 1051   Hepatic Function Panel     Component Value Date/Time   PROT 7.2 08/24/2018 1221   PROT 7.1 12/14/2014 0747   ALBUMIN 4.1 08/24/2018 1221   ALBUMIN 3.8 12/14/2014 0747   AST 14 08/24/2018 1221   AST 16 12/14/2014 0747   ALT 16 08/24/2018 1221   ALT 15 12/14/2014 0747   ALKPHOS 70 08/24/2018 1221   ALKPHOS 52 12/14/2014 0747   BILITOT 0.3 08/24/2018 1221   BILITOT 0.40 12/14/2014 0747      Component Value Date/Time   TSH 1.480 04/12/2018 1051   Results for VERNEL, DONLAN (MRN [id number]) as of 01/11/2019 12:17  Ref. Range 08/24/2018 12:21  Vitamin D, 25-Hydroxy Latest Ref Range: 30.0 - 100.0 ng/mL 46.3   OBESITY BEHAVIORAL INTERVENTION VISIT  Today's visit was #18   Starting weight: 259 lbs Starting date: 04/12/2018 Today's weight: 235 lbs  Today's date: 01/11/2019 Total lbs lost to date: 24 At least 15 minutes were spent on discussing the following behavioral intervention visit.    01/11/2019  Height 5\' 4"  (1.626 m)  Weight 235 lb (106.6 kg)  BMI (Calculated) 40.32  BLOOD PRESSURE - SYSTOLIC 834  BLOOD PRESSURE - DIASTOLIC 68   Body Fat %  50.1 %  Total Body Water (lbs) 81.81 lbs   ASK: We discussed the diagnosis of obesity with [name] today and Kathleen [name] agreed to give [name] permission to discuss obesity behavioral modification therapy today.  ASSESS: [name] has the diagnosis of obesity and her BMI today is 40.4. Kathleen [name] is in the action stage of change.   ADVISE: [name] was educated on the multiple health risks of obesity as well as the benefit of weight loss to improve her health. She was advised of the need for long term treatment and the importance of lifestyle modifications to improve her current health and to decrease her risk of future health problems.  AGREE: Multiple dietary modification options and treatment options were discussed and  Kathleen [name] agreed to follow the recommendations documented in the above note.  ARRANGE: Kathleen [name] was educated on the importance of frequent visits to treat obesity as outlined per CMS and USPSTF guidelines and agreed to schedule her next follow up appointment today.  Migdalia Dk, am acting as transcriptionist for Abby Potash, PA-C I, Abby Potash, PA-C have reviewed above note and agree with its content

## 2019-01-12 LAB — COMPREHENSIVE METABOLIC PANEL
ALT: 12 IU/L (ref 0–32)
AST: 14 IU/L (ref 0–40)
Albumin/Globulin Ratio: 1.6 (ref 1.2–2.2)
Albumin: 4.4 g/dL (ref 3.7–4.7)
Alkaline Phosphatase: 63 IU/L (ref 39–117)
BUN/Creatinine Ratio: 36 — ABNORMAL HIGH (ref 12–28)
BUN: 27 mg/dL (ref 8–27)
Bilirubin Total: 0.3 mg/dL (ref 0.0–1.2)
CO2: 25 mmol/L (ref 20–29)
Calcium: 9.7 mg/dL (ref 8.7–10.3)
Chloride: 99 mmol/L (ref 96–106)
Creatinine, Ser: 0.76 mg/dL (ref 0.57–1.00)
GFR calc Af Amer: 88 mL/min/{1.73_m2} (ref >59)
GFR calc non Af Amer: 76 mL/min/{1.73_m2} (ref >59)
Globulin, Total: 2.8 g/dL (ref 1.5–4.5)
Glucose: 111 mg/dL — ABNORMAL HIGH (ref 65–99)
Potassium: 4.5 mmol/L (ref 3.5–5.2)
Sodium: 141 mmol/L (ref 134–144)
Total Protein: 7.2 g/dL (ref 6.0–8.5)

## 2019-01-12 LAB — VITAMIN D 25 HYDROXY (VIT D DEFICIENCY, FRACTURES): Vit D, 25-Hydroxy: 50.3 ng/mL (ref 30.0–100.0)

## 2019-01-12 LAB — LIPID PANEL WITH LDL/HDL RATIO
Cholesterol, Total: 147 mg/dL (ref 100–199)
HDL: 58 mg/dL (ref >39)
LDL Calculated: 73 mg/dL (ref 0–99)
LDl/HDL Ratio: 1.3 ratio (ref 0.0–3.2)
Triglycerides: 80 mg/dL (ref 0–149)
VLDL Cholesterol Cal: 16 mg/dL (ref 5–40)

## 2019-01-12 LAB — HEMOGLOBIN A1C
Est. average glucose Bld gHb Est-mCnc: 123 mg/dL
Hgb A1c MFr Bld: 5.9 % — ABNORMAL HIGH (ref 4.8–5.6)

## 2019-01-12 LAB — INSULIN, RANDOM: INSULIN: 18.5 u[IU]/mL (ref 2.6–24.9)

## 2019-01-13 ENCOUNTER — Other Ambulatory Visit (INDEPENDENT_AMBULATORY_CARE_PROVIDER_SITE_OTHER): Payer: Self-pay | Admitting: Physician Assistant

## 2019-01-13 DIAGNOSIS — E559 Vitamin D deficiency, unspecified: Secondary | ICD-10-CM

## 2019-01-24 ENCOUNTER — Other Ambulatory Visit (INDEPENDENT_AMBULATORY_CARE_PROVIDER_SITE_OTHER): Payer: Self-pay | Admitting: Physician Assistant

## 2019-01-24 DIAGNOSIS — E119 Type 2 diabetes mellitus without complications: Secondary | ICD-10-CM

## 2019-01-28 ENCOUNTER — Other Ambulatory Visit (INDEPENDENT_AMBULATORY_CARE_PROVIDER_SITE_OTHER): Payer: Self-pay | Admitting: Physician Assistant

## 2019-01-28 DIAGNOSIS — E559 Vitamin D deficiency, unspecified: Secondary | ICD-10-CM

## 2019-01-28 DIAGNOSIS — E119 Type 2 diabetes mellitus without complications: Secondary | ICD-10-CM

## 2019-02-02 ENCOUNTER — Ambulatory Visit (INDEPENDENT_AMBULATORY_CARE_PROVIDER_SITE_OTHER): Payer: Medicare Other | Admitting: Physician Assistant

## 2019-02-02 ENCOUNTER — Other Ambulatory Visit: Payer: Self-pay

## 2019-02-02 ENCOUNTER — Encounter (INDEPENDENT_AMBULATORY_CARE_PROVIDER_SITE_OTHER): Payer: Self-pay | Admitting: Physician Assistant

## 2019-02-02 VITALS — BP 135/82 | HR 53 | Temp 98.2°F | Ht 64.0 in | Wt 237.0 lb

## 2019-02-02 DIAGNOSIS — E559 Vitamin D deficiency, unspecified: Secondary | ICD-10-CM | POA: Diagnosis not present

## 2019-02-02 DIAGNOSIS — Z9189 Other specified personal risk factors, not elsewhere classified: Secondary | ICD-10-CM | POA: Diagnosis not present

## 2019-02-02 DIAGNOSIS — E7849 Other hyperlipidemia: Secondary | ICD-10-CM

## 2019-02-02 DIAGNOSIS — I10 Essential (primary) hypertension: Secondary | ICD-10-CM | POA: Diagnosis not present

## 2019-02-02 DIAGNOSIS — Z6841 Body Mass Index (BMI) 40.0 and over, adult: Secondary | ICD-10-CM

## 2019-02-02 MED ORDER — VITAMIN D (ERGOCALCIFEROL) 1.25 MG (50000 UNIT) PO CAPS: 50000.0000 [IU] | ORAL_CAPSULE | ORAL | 0 refills | Status: DC

## 2019-02-02 MED ORDER — SIMVASTATIN 40 MG PO TABS: 40.0000 mg | ORAL_TABLET | Freq: Every day | ORAL | 2 refills | Status: DC

## 2019-02-02 MED ORDER — CHLORTHALIDONE 50 MG PO TABS: 50.0000 mg | ORAL_TABLET | Freq: Every day | ORAL | 0 refills | Status: DC

## 2019-02-07 NOTE — Progress Notes (Signed)
Office: [phone]  /  Fax: [phone]   HPI:   Chief Complaint: Kathleen [name] is here to discuss her progress with her obesity treatment plan. She is on the keep a food journal with 1500 calories and 95 grams of protein or follow the Category 3 plan and is following her eating plan approximately 95 % of the time. She states she is exercising 0 minutes 0 times per week. Kathleen [name] reports that she has only been eating between 1100-1300 calories, but is doing a great job getting all of her protein in. She is traveling to the beach to see her family next week.  Her weight is 237 lb (107.5 kg) today and has gained 2 lbs since her last visit. She has lost 22 lbs since starting treatment with [name].  Hypertension Kathleen [name] is a 78 y.o. female with hypertension. Kathleen [name]'s blood pressure is normal. She denies chest pain or headaches on chlorthalidone. She is working on weight loss to help control her blood pressure with the goal of decreasing her risk of heart attack and stroke.   Vitamin D Deficiency Kathleen [name] has a diagnosis of vitamin D deficiency. She is currently taking prescription Vit D and denies nausea, vomiting or muscle weakness.  At risk for osteopenia and osteoporosis Kathleen [name] is at higher risk of osteopenia and osteoporosis due to vitamin D deficiency.   Hyperlipidemia Kathleen [name] has hyperlipidemia and has been trying to improve her cholesterol levels with intensive lifestyle modification including a low saturated fat diet, exercise and weight loss. She is on simvastatin and denies any chest pain, claudication or myalgias.  ASSESSMENT AND PLAN:  Essential hypertension - Plan: chlorthalidone (HYGROTON) 50 MG tablet  Vitamin D deficiency - Plan: Vitamin D, Ergocalciferol, (DRISDOL) 1.25 MG (50000 UT) CAPS capsule  Other hyperlipidemia - Plan: simvastatin (ZOCOR) 40 MG tablet  At risk for osteoporosis  Class 3 severe obesity with serious comorbidity and body mass index  (BMI) of 40.0 to 44.9 in adult, unspecified obesity type (Kathleen [name])  PLAN:  Hypertension We discussed sodium restriction, working on healthy weight loss, and a regular exercise program as the means to achieve improved blood pressure control. Kathleen [name] agreed with this plan and agreed to follow up as directed. We will continue to monitor her blood pressure as well as her progress with the above lifestyle modifications. Kathleen [name] agrees to continue taking chlorthalidone 50 mg q daily #30 and we will refill for 1 month. She will watch for signs of hypotension as she continues her lifestyle modifications. Kathleen [name] agrees to follow up with our clinic in 2 weeks.  Vitamin D Deficiency Kathleen [name] was informed that low vitamin D levels contributes to fatigue and are associated with obesity, breast, and colon cancer. Kathleen [name] agrees to continue taking prescription Vit D 50,000 IU every week #4 and we will refill for 1 month. She will follow up for routine testing of vitamin D, at least 2-3 times per year. She was informed of the risk of over-replacement of vitamin D and agrees to not increase her dose unless she discusses this with [name]. Kathleen [name] agrees to follow up with our clinic in 2 weeks.  At risk for osteopenia and osteoporosis Kathleen [name] was given extended (15 minutes) osteoporosis prevention counseling today. Kathleen [name] is at risk for osteopenia and osteoporsis due to her vitamin D deficiency. She was encouraged to take her vitamin D and follow her higher calcium diet and increase strengthening exercise to help strengthen her bones and decrease her risk of osteopenia and osteoporosis.  Hyperlipidemia Kathleen [name] was informed of the American Heart Association Guidelines emphasizing intensive lifestyle modifications as the first line treatment for hyperlipidemia. We discussed many lifestyle modifications today in depth, and Kathleen [name] will continue to work on decreasing saturated fats such as fatty red meat, butter  and many fried foods. She will also increase vegetables and lean protein in her diet and continue to work on exercise and weight loss efforts. Kathleen [name] agrees to continue taking simvastatin 40 mg qhs #30 and we will refill for 2 months. Kathleen [name] agrees to follow up with our clinic in 2 weeks.  Obesity Kathleen [name] is currently in the action stage of change. As such, her goal is to continue with weight loss efforts She has agreed to keep a food journal with 1400-1500 calories and 95 grams of protein daily Kathleen [name] has been instructed to work up to a goal of 150 minutes of combined cardio and strengthening exercise per week for weight loss and overall health benefits. We discussed the following Behavioral Modification Strategies today: work on meal planning and easy cooking plans and planning for success   Kathleen [name] has agreed to follow up with our clinic in 2 weeks. She was informed of the importance of frequent follow up visits to maximize her success with intensive lifestyle modifications for her multiple health conditions.  ALLERGIES: Allergies  Allergen Reactions  . Mobic [Meloxicam] Shortness Of Breath    "Wheezing"   . Naproxen Sodium Swelling    Hands and feet  . Tape     Surgical tape adhesive and paper tape   . Doxycycline Itching and Rash    MEDICATIONS: Current Outpatient Medications on File Prior to Visit  Medication Sig Dispense Refill  . acetaminophen (TYLENOL) 500 MG tablet Take 500 mg by mouth every 6 (six) hours as needed. 1-2 tablets q 4-6 hrs            . aspirin 81 MG tablet Take 81 mg by mouth daily.    . betamethasone dipropionate (DIPROLENE) 0.05 % cream Apply topically 1 day or 1 dose.      . bisoprolol (ZEBETA) 5 MG tablet     . fexofenadine (ALLEGRA) 180 MG tablet Take 180 mg by mouth daily.     Marland Kitchen FIBER ADULT GUMMIES 2 g CHEW Chew 2 tablets by mouth daily.    . Glucosamine HCl 1500 MG TABS Take 2 tablets by mouth daily.    Marland Kitchen glucose blood test strip 1  each by Other route as needed for other. Use as instructed    . ibuprofen (ADVIL,MOTRIN) 200 MG tablet Take 200 mg by mouth every 6 (six) hours as needed.      Javier Docker Oil 300 MG CAPS Take by mouth daily.      . metFORMIN (GLUCOPHAGE) 500 MG tablet Take 1 tablet (500 mg total) by mouth daily with breakfast. 30 tablet 1  . omeprazole (PRILOSEC) 20 MG capsule Take 20 mg by mouth daily.      Marland Kitchen omeprazole-sodium bicarbonate (ZEGERID) 40-1100 MG per capsule Take 1 capsule by mouth daily before breakfast.      . Probiotic Product (PROBIOTIC DAILY PO) Take by mouth.    . valsartan (DIOVAN) 80 MG tablet Take 1 tablet (80 mg total) by mouth 2 (two) times daily. 180 tablet 0   No current facility-administered medications on file prior to visit.     PAST MEDICAL HISTORY: Past Medical History:  Diagnosis Date  . Arthritis   . Back pain   . Breast  cancer ([place]) 2012  . Cancer ([place])    colon  . Diabetes mellitus   . Family history of breast cancer   . Family history of colon cancer   . GERD (gastroesophageal reflux disease)   . Hyperlipidemia   . Hypertension   . Joint pain   . Kidney problem   . Lactose intolerance   . Neuromuscular disorder ([place])    back, hip, leg left side  . Obesity   . Personal history of radiation therapy 2012  . Sleep apnea   . Spinal stenosis     PAST SURGICAL HISTORY: Past Surgical History:  Procedure Laterality Date  . ABDOMINAL HYSTERECTOMY    . BREAST EXCISIONAL BIOPSY Left 1972  . BREAST LUMPECTOMY Right 2012  . BREAST SURGERY     biopsy  . COLON SURGERY     partial colectomy  . X-STOP IMPLANTATION  September 26th,2012    SOCIAL HISTORY: Social History   Tobacco Use  . Smoking status: Former Research scientist (life sciences)  . Smokeless tobacco: Never Used  Substance Use Topics  . Alcohol use: Yes  . Drug use: No    FAMILY HISTORY: Family History  Problem Relation Age of Onset  . Breast cancer Mother        dx late 48's  . Hyperlipidemia Mother   . Heart  disease Mother   . Liver disease Mother   . Breast cancer Sister 102  . Heart disease Father   . Obesity Father   . Hypertension Father   . Diabetes Father   . Heart disease Maternal Grandmother   . Colon cancer Cousin        dx 34+  . Kidney cancer Son 9  . Breast cancer Sister     ROS: Review of Systems  Constitutional: Negative for weight loss.  Cardiovascular: Negative for chest pain and claudication.  Gastrointestinal: Negative for nausea and vomiting.  Musculoskeletal: Negative for myalgias.       Negative muscle weakness  Neurological: Negative for headaches.    PHYSICAL EXAM: Blood pressure 135/82, pulse (!) 53, temperature 98.2 F (36.8 C), temperature source Oral, height 5\' 4"  (1.626 m), weight 237 lb (107.5 kg), SpO2 99 %. Body mass index is 40.68 kg/m. Physical Exam Vitals signs reviewed.  Constitutional:      Appearance: Normal appearance. She is obese.  Cardiovascular:     Rate and Rhythm: Normal rate.     Pulses: Normal pulses.  Pulmonary:     Effort: Pulmonary effort is normal.     Breath sounds: Normal breath sounds.  Musculoskeletal: Normal range of motion.  Skin:    General: Skin is warm and dry.  Neurological:     Mental Status: She is alert and oriented to person, place, and time.  Psychiatric:        Mood and Affect: Mood normal.        Behavior: Behavior normal.     RECENT LABS AND TESTS: BMET    Component Value Date/Time   NA 141 01/11/2019 1416   NA 141 12/14/2014 0747   K 4.5 01/11/2019 1416   K 4.2 12/14/2014 0747   CL 99 01/11/2019 1416   CO2 25 01/11/2019 1416   CO2 28 12/14/2014 0747   GLUCOSE 111 (H) 01/11/2019 1416   GLUCOSE 120 12/14/2014 0747   BUN 27 01/11/2019 1416   BUN 21.3 12/14/2014 0747   CREATININE 0.76 01/11/2019 1416   CREATININE 0.9 12/14/2014 0747   CALCIUM 9.7 01/11/2019 1416  CALCIUM 9.4 12/14/2014 0747   GFRNONAA 76 01/11/2019 1416   GFRAA 88 01/11/2019 1416   Lab Results  Component Value Date    HGBA1C 5.9 (H) 01/11/2019   HGBA1C 6.2 (H) 08/24/2018   HGBA1C 6.8 (H) 04/12/2018   Lab Results  Component Value Date   INSULIN 18.5 01/11/2019   INSULIN 24.9 08/24/2018   INSULIN 30.6 (H) 04/12/2018   CBC    Component Value Date/Time   WBC 7.1 04/12/2018 1051   WBC 8.0 12/14/2014 0746   WBC 6.1 03/04/2011 1040   RBC 4.27 04/12/2018 1051   RBC 4.17 12/14/2014 0746   RBC 3.99 03/04/2011 1040   HGB 12.8 04/12/2018 1051   HGB 12.4 12/14/2014 0746   HCT 37.8 04/12/2018 1051   HCT 37.0 12/14/2014 0746   PLT 230 12/14/2014 0746   MCV 89 04/12/2018 1051   MCV 88.9 12/14/2014 0746   MCH 30.0 04/12/2018 1051   MCH 29.8 12/14/2014 0746   MCH 30.1 03/04/2011 1040   MCHC 33.9 04/12/2018 1051   MCHC 33.5 12/14/2014 0746   MCHC 33.1 03/04/2011 1040   RDW 12.4 04/12/2018 1051   RDW 12.5 12/14/2014 0746   LYMPHSABS 2.0 04/12/2018 1051   LYMPHSABS 1.8 12/14/2014 0746   MONOABS 0.5 12/14/2014 0746   EOSABS 0.2 04/12/2018 1051   BASOSABS 0.0 04/12/2018 1051   BASOSABS 0.1 12/14/2014 0746   Iron/TIBC/Ferritin/ %Sat No results found for: IRON, TIBC, FERRITIN, IRONPCTSAT Lipid Panel     Component Value Date/Time   CHOL 147 01/11/2019 1416   TRIG 80 01/11/2019 1416   HDL 58 01/11/2019 1416   LDLCALC 73 01/11/2019 1416   Hepatic Function Panel     Component Value Date/Time   PROT 7.2 01/11/2019 1416   PROT 7.1 12/14/2014 0747   ALBUMIN 4.4 01/11/2019 1416   ALBUMIN 3.8 12/14/2014 0747   AST 14 01/11/2019 1416   AST 16 12/14/2014 0747   ALT 12 01/11/2019 1416   ALT 15 12/14/2014 0747   ALKPHOS 63 01/11/2019 1416   ALKPHOS 52 12/14/2014 0747   BILITOT 0.3 01/11/2019 1416   BILITOT 0.40 12/14/2014 0747      Component Value Date/Time   TSH 1.480 04/12/2018 1051      OBESITY BEHAVIORAL INTERVENTION VISIT  Today's visit was # 19   Starting weight: 259 lbs Starting date: 04/12/18 Today's weight : 237 lbs  Today's date: 02/02/2019 Total lbs lost to date: 22     ASK: We discussed the diagnosis of obesity with [place] today and Kathleen [name] agreed to give [name] permission to discuss obesity behavioral modification therapy today.  ASSESS: Kathleen [name] has the diagnosis of obesity and her BMI today is Angie is in the action stage of change   ADVISE: Kathleen [name] was educated on the multiple health risks of obesity as well as the benefit of weight loss to improve her health. She was advised of the need for long term treatment and the importance of lifestyle modifications to improve her current health and to decrease her risk of future health problems.  AGREE: Multiple dietary modification options and treatment options were discussed and  Kathleen [name] agreed to follow the recommendations documented in the above note.  ARRANGE: Kathleen [name] was educated on the importance of frequent visits to treat obesity as outlined per CMS and USPSTF guidelines and agreed to schedule her next follow up appointment today.  Wilhemena Durie, am acting as transcriptionist for Abby Potash, PA-C I, Abby Potash, PA-C have reviewed  above note and agree with its content

## 2019-02-16 ENCOUNTER — Ambulatory Visit (INDEPENDENT_AMBULATORY_CARE_PROVIDER_SITE_OTHER): Payer: Medicare Other | Admitting: Physician Assistant

## 2019-02-16 ENCOUNTER — Other Ambulatory Visit: Payer: Self-pay

## 2019-02-16 VITALS — BP 140/74 | HR 55 | Temp 98.1°F | Ht 64.0 in | Wt 236.0 lb

## 2019-02-16 DIAGNOSIS — Z6841 Body Mass Index (BMI) 40.0 and over, adult: Secondary | ICD-10-CM

## 2019-02-16 DIAGNOSIS — E119 Type 2 diabetes mellitus without complications: Secondary | ICD-10-CM | POA: Diagnosis not present

## 2019-02-16 NOTE — Progress Notes (Signed)
Office: [phone]  /  Fax: [phone]   HPI:   Chief Complaint: [place] is here to discuss her progress with her obesity treatment plan. She is on the keep a food journal with 1400 to 1500 calories and 95 grams of protein daily plan and she is following her eating plan approximately 80 % of the time. She states she is riding her bicycle 20 minutes 3 times per week. Kathleen [name] reports that she was at [employer] last week and she ate more fruit than normal. She is doing a good job getting all of her protein in. Her weight is 236 lb (107 kg) today and has had a weight loss of 1 pound over a period of 2 weeks since her last visit. She has lost 23 lbs since starting treatment with [name].  Diabetes II Kathleen [name] has a diagnosis of diabetes type II. She is on metformin currently. Kathleen [name] states fasting BGs range between 130's and 140's. Kathleen [name] denies any hypoglycemic episodes. Last A1c was at 5.9 She has been working on intensive lifestyle modifications including diet, exercise, and weight loss to help control her blood glucose levels.  ASSESSMENT AND PLAN:  Type 2 diabetes mellitus without complication, without long-term current use of insulin (HCC)  Class 3 severe obesity with serious comorbidity and body mass index (BMI) of 40.0 to 44.9 in adult, unspecified obesity type ([place])  PLAN:  Diabetes II Kathleen [name] has been given extensive diabetes education by myself today including ideal fasting and post-prandial blood glucose readings, individual ideal Hgb A1c goals and hypoglycemia prevention. We discussed the importance of good blood sugar control to decrease the likelihood of diabetic complications such as nephropathy, neuropathy, limb loss, blindness, coronary artery disease, and death. We discussed the importance of intensive lifestyle modification including diet, exercise and weight loss as the first line treatment for diabetes. Kathleen [name] agrees to continue her diabetes medications and  will follow up at the agreed upon time.  Obesity Kathleen [name] is currently in the action stage of change. As such, her goal is to continue with weight loss efforts She has agreed to keep a food journal with 1400 to 1500 calories and 95 grams of protein daily Kathleen [name] has been instructed to work up to a goal of 150 minutes of combined cardio and strengthening exercise per week for weight loss and overall health benefits. We discussed the following Behavioral Modification Strategies today: keep a strict food journal and decreasing simple carbohydrates   Kathleen [name] has agreed to follow up with our clinic in 2 weeks. She was informed of the importance of frequent follow up visits to maximize her success with intensive lifestyle modifications for her multiple health conditions.  I spent > than 50% of the 25 minute visit on counseling as documented in the note.    ALLERGIES: Allergies  Allergen Reactions   Mobic [Meloxicam] Shortness Of Breath    "Wheezing"    Naproxen Sodium Swelling    Hands and feet   Tape     Surgical tape adhesive and paper tape    Doxycycline Itching and Rash    MEDICATIONS: Current Outpatient Medications on File Prior to Visit  Medication Sig Dispense Refill   acetaminophen (TYLENOL) 500 MG tablet Take 500 mg by mouth every 6 (six) hours as needed. 1-2 tablets q 4-6 hrs             aspirin 81 MG tablet Take 81 mg by mouth daily.     betamethasone dipropionate (DIPROLENE) 0.05 % cream Apply  topically 1 day or 1 dose.       bisoprolol (ZEBETA) 5 MG tablet      chlorthalidone (HYGROTON) 50 MG tablet Take 1 tablet (50 mg total) by mouth daily. 30 tablet 0   fexofenadine (ALLEGRA) 180 MG tablet Take 180 mg by mouth daily.      FIBER ADULT GUMMIES 2 g CHEW Chew 2 tablets by mouth daily.     Glucosamine HCl 1500 MG TABS Take 2 tablets by mouth daily.     glucose blood test strip 1 each by Other route as needed for other. Use as instructed      ibuprofen (ADVIL,MOTRIN) 200 MG tablet Take 200 mg by mouth every 6 (six) hours as needed.       Krill Oil 300 MG CAPS Take by mouth daily.       metFORMIN (GLUCOPHAGE) 500 MG tablet Take 1 tablet (500 mg total) by mouth daily with breakfast. 30 tablet 1   omeprazole (PRILOSEC) 20 MG capsule Take 20 mg by mouth daily.       omeprazole-sodium bicarbonate (ZEGERID) 40-1100 MG per capsule Take 1 capsule by mouth daily before breakfast.       Probiotic Product (PROBIOTIC DAILY PO) Take by mouth.     simvastatin (ZOCOR) 40 MG tablet Take 1 tablet (40 mg total) by mouth at bedtime. 30 tablet 2   valsartan (DIOVAN) 80 MG tablet Take 1 tablet (80 mg total) by mouth 2 (two) times daily. 180 tablet 0   Vitamin D, Ergocalciferol, (DRISDOL) 1.25 MG (50000 UT) CAPS capsule Take 1 capsule (50,000 Units total) by mouth every 7 (seven) days. 4 capsule 0   No current facility-administered medications on file prior to visit.     PAST MEDICAL HISTORY: Past Medical History:  Diagnosis Date   Arthritis    Back pain    Breast cancer ([place]) 2012   Cancer ([place])    colon   Diabetes mellitus    Family history of breast cancer    Family history of colon cancer    GERD (gastroesophageal reflux disease)    Hyperlipidemia    Hypertension    Joint pain    Kidney problem    Lactose intolerance    Neuromuscular disorder (HCC)    back, hip, leg left side   Obesity    Personal history of radiation therapy 2012   Sleep apnea    Spinal stenosis     PAST SURGICAL HISTORY: Past Surgical History:  Procedure Laterality Date   ABDOMINAL HYSTERECTOMY     BREAST EXCISIONAL BIOPSY Left 1972   BREAST LUMPECTOMY Right 2012   BREAST SURGERY     biopsy   COLON SURGERY     partial colectomy   X-STOP IMPLANTATION  September 26th,2012    SOCIAL HISTORY: Social History   Tobacco Use   Smoking status: Former Smoker   Smokeless tobacco: Never Used  Substance Use Topics   Alcohol  use: Yes   Drug use: No    FAMILY HISTORY: Family History  Problem Relation Age of Onset   Breast cancer Mother        dx late 55's   Hyperlipidemia Mother    Heart disease Mother    Liver disease Mother    Breast cancer Sister 41   Heart disease Father    Obesity Father    Hypertension Father    Diabetes Father    Heart disease Maternal Grandmother    Colon cancer Cousin  dx 11+   Kidney cancer Son 39   Breast cancer Sister     ROS: Review of Systems  Constitutional: Positive for weight loss.  Gastrointestinal: Negative for diarrhea, nausea and vomiting.  Endo/Heme/Allergies:       Negative for hypoglycemia    PHYSICAL EXAM: Blood pressure 140/74, pulse (!) 55, temperature 98.1 F (36.7 C), temperature source Oral, height 5\' 4"  (1.626 m), weight 236 lb (107 kg), SpO2 94 %. Body mass index is 40.51 kg/m. Physical Exam Vitals signs reviewed.  Constitutional:      Appearance: Normal appearance. She is well-developed. She is obese.  Cardiovascular:     Rate and Rhythm: Normal rate.  Pulmonary:     Effort: Pulmonary effort is normal.  Musculoskeletal: Normal range of motion.  Skin:    General: Skin is warm and dry.  Neurological:     Mental Status: She is alert and oriented to person, place, and time.  Psychiatric:        Mood and Affect: Mood normal.        Behavior: Behavior normal.     RECENT LABS AND TESTS: BMET    Component Value Date/Time   NA 141 01/11/2019 1416   NA 141 12/14/2014 0747   K 4.5 01/11/2019 1416   K 4.2 12/14/2014 0747   CL 99 01/11/2019 1416   CO2 25 01/11/2019 1416   CO2 28 12/14/2014 0747   GLUCOSE 111 (H) 01/11/2019 1416   GLUCOSE 120 12/14/2014 0747   BUN 27 01/11/2019 1416   BUN 21.3 12/14/2014 0747   CREATININE 0.76 01/11/2019 1416   CREATININE 0.9 12/14/2014 0747   CALCIUM 9.7 01/11/2019 1416   CALCIUM 9.4 12/14/2014 0747   GFRNONAA 76 01/11/2019 1416   GFRAA 88 01/11/2019 1416   Lab Results    Component Value Date   HGBA1C 5.9 (H) 01/11/2019   HGBA1C 6.2 (H) 08/24/2018   HGBA1C 6.8 (H) 04/12/2018   Lab Results  Component Value Date   INSULIN 18.5 01/11/2019   INSULIN 24.9 08/24/2018   INSULIN 30.6 (H) 04/12/2018   CBC    Component Value Date/Time   WBC 7.1 04/12/2018 1051   WBC 8.0 12/14/2014 0746   WBC 6.1 03/04/2011 1040   RBC 4.27 04/12/2018 1051   RBC 4.17 12/14/2014 0746   RBC 3.99 03/04/2011 1040   HGB 12.8 04/12/2018 1051   HGB 12.4 12/14/2014 0746   HCT 37.8 04/12/2018 1051   HCT 37.0 12/14/2014 0746   PLT 230 12/14/2014 0746   MCV 89 04/12/2018 1051   MCV 88.9 12/14/2014 0746   MCH 30.0 04/12/2018 1051   MCH 29.8 12/14/2014 0746   MCH 30.1 03/04/2011 1040   MCHC 33.9 04/12/2018 1051   MCHC 33.5 12/14/2014 0746   MCHC 33.1 03/04/2011 1040   RDW 12.4 04/12/2018 1051   RDW 12.5 12/14/2014 0746   LYMPHSABS 2.0 04/12/2018 1051   LYMPHSABS 1.8 12/14/2014 0746   MONOABS 0.5 12/14/2014 0746   EOSABS 0.2 04/12/2018 1051   BASOSABS 0.0 04/12/2018 1051   BASOSABS 0.1 12/14/2014 0746   Iron/TIBC/Ferritin/ %Sat No results found for: IRON, TIBC, FERRITIN, IRONPCTSAT Lipid Panel     Component Value Date/Time   CHOL 147 01/11/2019 1416   TRIG 80 01/11/2019 1416   HDL 58 01/11/2019 1416   LDLCALC 73 01/11/2019 1416   Hepatic Function Panel     Component Value Date/Time   PROT 7.2 01/11/2019 1416   PROT 7.1 12/14/2014 0747   ALBUMIN 4.4  01/11/2019 1416   ALBUMIN 3.8 12/14/2014 0747   AST 14 01/11/2019 1416   AST 16 12/14/2014 0747   ALT 12 01/11/2019 1416   ALT 15 12/14/2014 0747   ALKPHOS 63 01/11/2019 1416   ALKPHOS 52 12/14/2014 0747   BILITOT 0.3 01/11/2019 1416   BILITOT 0.40 12/14/2014 0747      Component Value Date/Time   TSH 1.480 04/12/2018 1051    Results for SHERRIA, CALOGERO (MRN [id number]) as of 02/16/2019 18:02  Ref. Range 01/11/2019 14:16  Vitamin D, 25-Hydroxy Latest Ref Range: 30.0 - 100.0 ng/mL 50.3    OBESITY BEHAVIORAL  INTERVENTION VISIT  Today's visit was # 20   Starting weight: 259 lbs Starting date: 04/12/2018 Today's weight : 236 lbs  Today's date: 02/16/2019 Total lbs lost to date: 23    02/16/2019  Height 5\' 4"  (1.626 m)  Weight 236 lb (107 kg)  BMI (Calculated) 40.49  BLOOD PRESSURE - SYSTOLIC [id number]  BLOOD PRESSURE - DIASTOLIC 74   Body Fat % 123456 %  Total Body Water (lbs) 82 lbs    ASK: We discussed the diagnosis of obesity with [name] today and Kathleen [name] agreed to give [name] permission to discuss obesity behavioral modification therapy today.  ASSESS: Kathleen [name] has the diagnosis of obesity and her BMI today is 50.49 Kathleen [name] is in the action stage of change   ADVISE: Kathleen [name] was educated on the multiple health risks of obesity as well as the benefit of weight loss to improve her health. She was advised of the need for long term treatment and the importance of lifestyle modifications to improve her current health and to decrease her risk of future health problems.  AGREE: Multiple dietary modification options and treatment options were discussed and  Kathleen [name] agreed to follow the recommendations documented in the above note.  ARRANGE: Kathleen [name] was educated on the importance of frequent visits to treat obesity as outlined per CMS and USPSTF guidelines and agreed to schedule her next follow up appointment today.  Corey Skains, am acting as transcriptionist for Abby Potash, PA-C I, Abby Potash, PA-C have reviewed above note and agree with its content

## 2019-03-02 ENCOUNTER — Encounter (INDEPENDENT_AMBULATORY_CARE_PROVIDER_SITE_OTHER): Payer: Self-pay | Admitting: Physician Assistant

## 2019-03-02 ENCOUNTER — Other Ambulatory Visit: Payer: Self-pay

## 2019-03-02 ENCOUNTER — Ambulatory Visit (INDEPENDENT_AMBULATORY_CARE_PROVIDER_SITE_OTHER): Payer: Medicare Other | Admitting: Physician Assistant

## 2019-03-02 VITALS — BP 131/76 | HR 54 | Temp 97.8°F | Ht 64.0 in | Wt 235.0 lb

## 2019-03-02 DIAGNOSIS — E119 Type 2 diabetes mellitus without complications: Secondary | ICD-10-CM | POA: Diagnosis not present

## 2019-03-02 DIAGNOSIS — Z6841 Body Mass Index (BMI) 40.0 and over, adult: Secondary | ICD-10-CM | POA: Diagnosis not present

## 2019-03-02 DIAGNOSIS — E7849 Other hyperlipidemia: Secondary | ICD-10-CM | POA: Diagnosis not present

## 2019-03-02 MED ORDER — ONETOUCH ULTRA 2 W/DEVICE KIT: PACK | 0 refills | Status: DC

## 2019-03-02 MED ORDER — GLUCOSE BLOOD VI STRP: ORAL_STRIP | 0 refills | Status: DC

## 2019-03-02 MED ORDER — METFORMIN HCL 500 MG PO TABS: 500.0000 mg | ORAL_TABLET | Freq: Every day | ORAL | 0 refills | Status: DC

## 2019-03-02 MED ORDER — ONETOUCH ULTRASOFT LANCETS MISC: 0 refills | Status: DC

## 2019-03-04 ENCOUNTER — Encounter (INDEPENDENT_AMBULATORY_CARE_PROVIDER_SITE_OTHER): Payer: Self-pay | Admitting: Physician Assistant

## 2019-03-05 ENCOUNTER — Other Ambulatory Visit (INDEPENDENT_AMBULATORY_CARE_PROVIDER_SITE_OTHER): Payer: Self-pay | Admitting: Physician Assistant

## 2019-03-05 DIAGNOSIS — E559 Vitamin D deficiency, unspecified: Secondary | ICD-10-CM

## 2019-03-05 DIAGNOSIS — I10 Essential (primary) hypertension: Secondary | ICD-10-CM

## 2019-03-07 ENCOUNTER — Other Ambulatory Visit: Payer: Self-pay

## 2019-03-07 ENCOUNTER — Ambulatory Visit
Admission: RE | Admit: 2019-03-07 | Discharge: 2019-03-07 | Disposition: A | Discharge status: Home / Self Care | Payer: Medicare Other | Source: Ambulatory Visit | Attending: Family Medicine | Admitting: Family Medicine

## 2019-03-07 DIAGNOSIS — E2839 Other primary ovarian failure: Secondary | ICD-10-CM

## 2019-03-07 NOTE — Progress Notes (Signed)
Office: [phone]  /  Fax: [phone]   HPI:   Chief Complaint: [place] is here to discuss her progress with her obesity treatment plan. She is on the Category 3 plan and is following her eating plan approximately 80 % of the time. She states she is bicycling 20 to 25 minutes 3 times per week. [name] is doing a good job hitting her protein goal daily. She is starting to exercise regularly. Her weight is 235 lb (106.6 kg) today and has had a weight loss of 1 pound over a period of 2 weeks since her last visit. She has lost 24 lbs since starting treatment with [name].  Diabetes II [name] has a diagnosis of diabetes type II. [name] states fasting BGs range between 120 and 145.  [name] is on metformin and she denies nausea, vomiting or diarrhea. She has been working on intensive lifestyle modifications including diet, exercise, and weight loss to help control her blood glucose levels. [name] denies polyphagia.  Hyperlipidemia [name] has hyperlipidemia and she is on simvastatin. She has been trying to improve her cholesterol levels with intensive lifestyle modification including a low saturated fat diet, exercise and weight loss. She denies any chest pain.  ASSESSMENT AND PLAN:  Type 2 diabetes mellitus without complication, without long-term current use of insulin ([place]) - Plan: metFORMIN (GLUCOPHAGE) 500 MG tablet, Blood Glucose Monitoring Suppl (ONE TOUCH ULTRA 2) w/Device KIT, Lancets (ONETOUCH ULTRASOFT) lancets, glucose blood test strip  Other hyperlipidemia  Class 3 severe obesity with serious comorbidity and body mass index (BMI) of 40.0 to 44.9 in adult, unspecified obesity type ([place])  PLAN:  Diabetes II [name] has been given extensive diabetes education by myself today including ideal fasting and post-prandial blood glucose readings, individual ideal Hgb A1c goals and hypoglycemia prevention. We discussed the importance of good blood sugar control to  decrease the likelihood of diabetic complications such as nephropathy, neuropathy, limb loss, blindness, coronary artery disease, and death. We discussed the importance of intensive lifestyle modification including diet, exercise and weight loss as the first line treatment for diabetes. [name] agrees to continue metformin 500 mg daily #30 with no refills and follow up at the agreed upon time. Prescription was also written today for One Touch Ultra II glucometer, #100 lancets and #100 strips.  Hyperlipidemia [name] was informed of the American Heart Association Guidelines emphasizing intensive lifestyle modifications as the first line treatment for hyperlipidemia. We discussed many lifestyle modifications today in depth, and [name] will continue to work on decreasing saturated fats such as fatty red meat, butter and many fried foods. She will also increase vegetables and lean protein in her diet and continue to work on exercise and weight loss efforts. [name] will continue simvastatin and follow up as directed.  Obesity [name] is currently in the action stage of change. As such, her goal is to continue with weight loss efforts She has agreed to keep a food journal with 1500 calories and 95 grams of protein daily [name] has been instructed to work up to a goal of 150 minutes of combined cardio and strengthening exercise per week for weight loss and overall health benefits. We discussed the following Behavioral Modification Strategies today: keeping healthy foods in the home and work on meal planning and easy [place] has agreed to follow up with our clinic in 2 weeks. She was informed of the importance of frequent follow up visits to maximize her success with intensive lifestyle modifications for her multiple  health conditions.  ALLERGIES: Allergies  Allergen Reactions   Mobic [Meloxicam] Shortness Of Breath    "Wheezing"    Naproxen Sodium Swelling    Hands and feet     Tape     Surgical tape adhesive and paper tape    Doxycycline Itching and Rash    MEDICATIONS: Current Outpatient Medications on File Prior to Visit  Medication Sig Dispense Refill   acetaminophen (TYLENOL) 500 MG tablet Take 500 mg by mouth every 6 (six) hours as needed. 1-2 tablets q 4-6 hrs             aspirin 81 MG tablet Take 81 mg by mouth daily.     betamethasone dipropionate (DIPROLENE) 0.05 % cream Apply topically 1 day or 1 dose.       bisoprolol (ZEBETA) 5 MG tablet      chlorthalidone (HYGROTON) 50 MG tablet Take 1 tablet (50 mg total) by mouth daily. 30 tablet 0   fexofenadine (ALLEGRA) 180 MG tablet Take 180 mg by mouth daily.      FIBER ADULT GUMMIES 2 g CHEW Chew 2 tablets by mouth daily.     Glucosamine HCl 1500 MG TABS Take 2 tablets by mouth daily.     ibuprofen (ADVIL,MOTRIN) 200 MG tablet Take 200 mg by mouth every 6 (six) hours as needed.       Krill Oil 300 MG CAPS Take by mouth daily.       omeprazole (PRILOSEC) 20 MG capsule Take 20 mg by mouth daily.       omeprazole-sodium bicarbonate (ZEGERID) 40-1100 MG per capsule Take 1 capsule by mouth daily before breakfast.       Probiotic Product (PROBIOTIC DAILY PO) Take by mouth.     simvastatin (ZOCOR) 40 MG tablet Take 1 tablet (40 mg total) by mouth at bedtime. 30 tablet 2   valsartan (DIOVAN) 80 MG tablet Take 1 tablet (80 mg total) by mouth 2 (two) times daily. 180 tablet 0   Vitamin D, Ergocalciferol, (DRISDOL) 1.25 MG (50000 UT) CAPS capsule Take 1 capsule (50,000 Units total) by mouth every 7 (seven) days. 4 capsule 0   No current facility-administered medications on file prior to visit.     PAST MEDICAL HISTORY: Past Medical History:  Diagnosis Date   Arthritis    Back pain    Breast cancer ([place]) 2012   Cancer [facility])    colon   Diabetes mellitus    Family history of breast cancer    Family history of colon cancer    GERD (gastroesophageal reflux disease)     Hyperlipidemia    Hypertension    Joint pain    Kidney problem    Lactose intolerance    Neuromuscular disorder ([place])    back, hip, leg left side   Obesity    Personal history of radiation therapy 2012   Sleep apnea    Spinal stenosis     PAST SURGICAL HISTORY: Past Surgical History:  Procedure Laterality Date   ABDOMINAL HYSTERECTOMY     BREAST EXCISIONAL BIOPSY Left 1972   BREAST LUMPECTOMY Right 2012   BREAST SURGERY     biopsy   COLON SURGERY     partial colectomy   X-STOP IMPLANTATION  September 26th,2012    SOCIAL HISTORY: Social History   Tobacco Use   Smoking status: Former Smoker   Smokeless tobacco: Never Used  Substance Use Topics   Alcohol use: Yes   Drug use: No  FAMILY HISTORY: Family History  Problem Relation Age of Onset   Breast cancer Mother        dx late 35's   Hyperlipidemia Mother    Heart disease Mother    Liver disease Mother    Breast cancer Sister 4   Heart disease Father    Obesity Father    Hypertension Father    Diabetes Father    Heart disease Maternal Grandmother    Colon cancer Cousin        dx 66+   Kidney cancer Son 51   Breast cancer Sister     ROS: Review of Systems  Constitutional: Positive for weight loss.  Cardiovascular: Negative for chest pain.  Gastrointestinal: Negative for diarrhea, nausea and vomiting.  Endo/Heme/Allergies:       Negative for polyphagia    PHYSICAL EXAM: Blood pressure 131/76, pulse (!) 54, temperature 97.8 F (36.6 C), temperature source Oral, height 5' 4" (1.626 m), weight 235 lb (106.6 kg), SpO2 96 %. Body mass index is 40.34 kg/m. Physical Exam Vitals signs reviewed.  Constitutional:      Appearance: Normal appearance. She is well-developed. She is obese.  Cardiovascular:     Rate and Rhythm: Normal rate.  Pulmonary:     Effort: Pulmonary effort is normal.  Musculoskeletal: Normal range of motion.  Skin:    General: Skin is warm and dry.   Neurological:     Mental Status: She is alert and oriented to person, place, and time.  Psychiatric:        Mood and Affect: Mood normal.        Behavior: Behavior normal.     RECENT LABS AND TESTS: BMET    Component Value Date/Time   NA 141 01/11/2019 1416   NA 141 12/14/2014 0747   K 4.5 01/11/2019 1416   K 4.2 12/14/2014 0747   CL 99 01/11/2019 1416   CO2 25 01/11/2019 1416   CO2 28 12/14/2014 0747   GLUCOSE 111 (H) 01/11/2019 1416   GLUCOSE 120 12/14/2014 0747   BUN 27 01/11/2019 1416   BUN 21.3 12/14/2014 0747   CREATININE 0.76 01/11/2019 1416   CREATININE 0.9 12/14/2014 0747   CALCIUM 9.7 01/11/2019 1416   CALCIUM 9.4 12/14/2014 0747   GFRNONAA 76 01/11/2019 1416   GFRAA 88 01/11/2019 1416   Lab Results  Component Value Date   HGBA1C 5.9 (H) 01/11/2019   HGBA1C 6.2 (H) 08/24/2018   HGBA1C 6.8 (H) 04/12/2018   Lab Results  Component Value Date   INSULIN 18.5 01/11/2019   INSULIN 24.9 08/24/2018   INSULIN 30.6 (H) 04/12/2018   CBC    Component Value Date/Time   WBC 7.1 04/12/2018 1051   WBC 8.0 12/14/2014 0746   WBC 6.1 03/04/2011 1040   RBC 4.27 04/12/2018 1051   RBC 4.17 12/14/2014 0746   RBC 3.99 03/04/2011 1040   HGB 12.8 04/12/2018 1051   HGB 12.4 12/14/2014 0746   HCT 37.8 04/12/2018 1051   HCT 37.0 12/14/2014 0746   PLT 230 12/14/2014 0746   MCV 89 04/12/2018 1051   MCV 88.9 12/14/2014 0746   MCH 30.0 04/12/2018 1051   MCH 29.8 12/14/2014 0746   MCH 30.1 03/04/2011 1040   MCHC 33.9 04/12/2018 1051   MCHC 33.5 12/14/2014 0746   MCHC 33.1 03/04/2011 1040   RDW 12.4 04/12/2018 1051   RDW 12.5 12/14/2014 0746   LYMPHSABS 2.0 04/12/2018 1051   LYMPHSABS 1.8 12/14/2014 0746   MONOABS 0.5  12/14/2014 0746   EOSABS 0.2 04/12/2018 1051   BASOSABS 0.0 04/12/2018 1051   BASOSABS 0.1 12/14/2014 0746   Iron/TIBC/Ferritin/ %Sat No results found for: IRON, TIBC, FERRITIN, IRONPCTSAT Lipid Panel     Component Value Date/Time   CHOL 147  01/11/2019 1416   TRIG 80 01/11/2019 1416   HDL 58 01/11/2019 1416   LDLCALC 73 01/11/2019 1416   Hepatic Function Panel     Component Value Date/Time   PROT 7.2 01/11/2019 1416   PROT 7.1 12/14/2014 0747   ALBUMIN 4.4 01/11/2019 1416   ALBUMIN 3.8 12/14/2014 0747   AST 14 01/11/2019 1416   AST 16 12/14/2014 0747   ALT 12 01/11/2019 1416   ALT 15 12/14/2014 0747   ALKPHOS 63 01/11/2019 1416   ALKPHOS 52 12/14/2014 0747   BILITOT 0.3 01/11/2019 1416   BILITOT 0.40 12/14/2014 0747      Component Value Date/Time   TSH 1.480 04/12/2018 1051     Ref. Range 01/11/2019 14:16  Vitamin D, 25-Hydroxy Latest Ref Range: 30.0 - 100.0 ng/mL 50.3    OBESITY BEHAVIORAL INTERVENTION VISIT  Today's visit was # 22  Starting weight: 259 lbs Starting date: 04/12/2018 Today's weight : 235 lbs  Today's date: 03/02/2019 Total lbs lost to date: 24    03/02/2019  Height 5' 4" (1.626 m)  Weight 235 lb (106.6 kg)  BMI (Calculated) 40.32  BLOOD PRESSURE - SYSTOLIC 517  BLOOD PRESSURE - DIASTOLIC 76   Body Fat % 00.1 %  Total Body Water (lbs) 83 lbs    ASK: We discussed the diagnosis of obesity with [name] today and [name] agreed to give [name] permission to discuss obesity behavioral modification therapy today.  ASSESS: [name] has the diagnosis of obesity and her BMI today is 50.32 [name] is in the action stage of change   ADVISE: [name] was educated on the multiple health risks of obesity as well as the benefit of weight loss to improve her health. She was advised of the need for long term treatment and the importance of lifestyle modifications to improve her current health and to decrease her risk of future health problems.  AGREE: Multiple dietary modification options and treatment options were discussed and  [name] agreed to follow the recommendations documented in the above note.  ARRANGE: [name] was educated on the importance of frequent visits to treat obesity  as outlined per CMS and USPSTF guidelines and agreed to schedule her next follow up appointment today.  Corey Skains, am acting as transcriptionist for Abby Potash, PA-C I, Abby Potash, PA-C have reviewed above note and agree with its content

## 2019-03-09 ENCOUNTER — Other Ambulatory Visit (INDEPENDENT_AMBULATORY_CARE_PROVIDER_SITE_OTHER): Payer: Self-pay | Admitting: Physician Assistant

## 2019-03-09 DIAGNOSIS — I10 Essential (primary) hypertension: Secondary | ICD-10-CM

## 2019-03-11 ENCOUNTER — Other Ambulatory Visit (INDEPENDENT_AMBULATORY_CARE_PROVIDER_SITE_OTHER): Payer: Self-pay | Admitting: Physician Assistant

## 2019-03-11 DIAGNOSIS — E119 Type 2 diabetes mellitus without complications: Secondary | ICD-10-CM

## 2019-03-11 DIAGNOSIS — E559 Vitamin D deficiency, unspecified: Secondary | ICD-10-CM

## 2019-03-14 ENCOUNTER — Other Ambulatory Visit (INDEPENDENT_AMBULATORY_CARE_PROVIDER_SITE_OTHER): Payer: Self-pay | Admitting: Physician Assistant

## 2019-03-14 DIAGNOSIS — E119 Type 2 diabetes mellitus without complications: Secondary | ICD-10-CM

## 2019-03-14 DIAGNOSIS — E559 Vitamin D deficiency, unspecified: Secondary | ICD-10-CM

## 2019-03-16 ENCOUNTER — Ambulatory Visit (INDEPENDENT_AMBULATORY_CARE_PROVIDER_SITE_OTHER): Payer: Medicare Other | Admitting: Physician Assistant

## 2019-03-16 ENCOUNTER — Other Ambulatory Visit: Payer: Self-pay

## 2019-03-16 ENCOUNTER — Other Ambulatory Visit (INDEPENDENT_AMBULATORY_CARE_PROVIDER_SITE_OTHER): Payer: Self-pay | Admitting: Physician Assistant

## 2019-03-16 VITALS — BP 142/84 | HR 55 | Temp 97.9°F | Ht 64.0 in | Wt 235.0 lb

## 2019-03-16 DIAGNOSIS — E559 Vitamin D deficiency, unspecified: Secondary | ICD-10-CM

## 2019-03-16 DIAGNOSIS — E119 Type 2 diabetes mellitus without complications: Secondary | ICD-10-CM | POA: Diagnosis not present

## 2019-03-16 DIAGNOSIS — E66813 Obesity, class 3: Secondary | ICD-10-CM

## 2019-03-16 DIAGNOSIS — Z6841 Body Mass Index (BMI) 40.0 and over, adult: Secondary | ICD-10-CM | POA: Diagnosis not present

## 2019-03-16 MED ORDER — VITAMIN D (ERGOCALCIFEROL) 1.25 MG (50000 UNIT) PO CAPS: 50000.0000 [IU] | ORAL_CAPSULE | ORAL | 0 refills | Status: DC

## 2019-03-16 MED ORDER — METFORMIN HCL 500 MG PO TABS: 500.0000 mg | ORAL_TABLET | Freq: Every day | ORAL | 0 refills | Status: DC

## 2019-03-16 MED ORDER — GLUCOSE BLOOD VI STRP: ORAL_STRIP | 0 refills | Status: DC

## 2019-03-16 NOTE — Progress Notes (Signed)
Office: [phone]  /  Fax: [phone]   HPI:   Chief Complaint: Kathleen [name] is here to discuss her progress with her obesity treatment plan. She is keeping a food journal with 1500 calories and 95 grams of protein and is following her eating plan approximately 90-100% of the time. She states she is biking 30 minutes 5 times per week. Kathleen [name] has been following the plan very well and getting in all of her protein. She is going to the beach in 2 weeks. Her weight is 235 lb (106.6 kg) today and has not lost weight since her last visit. She has lost 24 lbs since starting treatment with [name].  Diabetes II Kathleen [name] has a diagnosis of diabetes type II and is on metformin. Kathleen [name] states fasting blood sugars range between 117 and 144. Last A1c was 5.9 on 01/11/2019. She has been working on intensive lifestyle modifications including diet, exercise, and weight loss to help control her blood glucose levels.  Vitamin D deficiency Kathleen [name] has a diagnosis of Vitamin D deficiency. She is currently taking prescription Vit D and denies nausea, vomiting or muscle weakness.  ASSESSMENT AND PLAN:  Type 2 diabetes mellitus without complication, without long-term current use of insulin ([place]) - Plan: metFORMIN (GLUCOPHAGE) 500 MG tablet, glucose blood test strip  Vitamin D deficiency - Plan: Vitamin D, Ergocalciferol, (DRISDOL) 1.25 MG (50000 UT) CAPS capsule  Class 3 severe obesity with serious comorbidity and body mass index (BMI) of 40.0 to 44.9 in adult, unspecified obesity type ([place])  PLAN:  Diabetes II Kathleen [name] has been given extensive diabetes education by myself today including ideal fasting and post-prandial blood glucose readings, individual ideal HgA1c goals  and hypoglycemia prevention. We discussed the importance of good blood sugar control to decrease the likelihood of diabetic complications such as nephropathy, neuropathy, limb loss, blindness, coronary artery disease, and death. We  discussed the importance of intensive lifestyle modification including diet, exercise and weight loss as the first line treatment for diabetes. Kathleen [name] was given a refill on her metformin #30 with 0 refills and a refill on her test strips #100 with 0 refills. She agrees to follow-up with our clinic in 3 weeks.  Vitamin D Deficiency Kathleen [name] was informed that low Vitamin D levels contributes to fatigue and are associated with obesity, breast, and colon cancer. She agrees to continue to take prescription Vit D @ 50,000 IU every week #4 with 0 refills and will follow-up for routine testing of Vitamin D, at least 2-3 times per year. She was informed of the risk of over-replacement of Vitamin D and agrees to not increase her dose unless she discusses this with [name]. Kathleen [name] agrees to follow-up with our clinic in 3 weeks.  Obesity Kathleen [name] is currently in the action stage of change. As such, her goal is to continue with weight loss efforts. She has agreed to keep a food journal with 1500 calories and 95 grams of protein daily.  Kathleen [name] will have IC at her next visit. Kathleen [name] has been instructed to work up to a goal of 150 minutes of combined cardio and strengthening exercise per week for weight loss and overall health benefits. We discussed the following Behavioral Modification Strategies today: work on meal planning and easy cooking plans, and keeping healthy foods in the home.  K[place]leen [name] has agreed to follow-up with our clinic in 3 weeks. She was informed of the importance of frequent follow-up visits to maximize her success with intensive lifestyle modifications for her multiple health  conditions.  ALLERGIES: Allergies  Allergen Reactions   Mobic [Meloxicam] Shortness Of Breath    "Wheezing"    Naproxen Sodium Swelling    Hands and feet   Tape     Surgical tape adhesive and paper tape    Doxycycline Itching and Rash    MEDICATIONS: Current Outpatient Medications on File Prior to  Visit  Medication Sig Dispense Refill   acetaminophen (TYLENOL) 500 MG tablet Take 500 mg by mouth every 6 (six) hours as needed. 1-2 tablets q 4-6 hrs             aspirin 81 MG tablet Take 81 mg by mouth daily.     betamethasone dipropionate (DIPROLENE) 0.05 % cream Apply topically 1 day or 1 dose.       bisoprolol (ZEBETA) 5 MG tablet      Blood Glucose Monitoring Suppl (ONE TOUCH ULTRA 2) w/Device KIT Use to check blood sugar two times a day 1 kit 0   chlorthalidone (HYGROTON) 50 MG tablet Take 1 tablet (50 mg total) by mouth daily. 30 tablet 0   fexofenadine (ALLEGRA) 180 MG tablet Take 180 mg by mouth daily.      FIBER ADULT GUMMIES 2 g CHEW Chew 2 tablets by mouth daily.     Glucosamine HCl 1500 MG TABS Take 2 tablets by mouth daily.     glucose blood test strip Use to check glucose two times daily 100 each 0   ibuprofen (ADVIL,MOTRIN) 200 MG tablet Take 200 mg by mouth every 6 (six) hours as needed.       Krill Oil 300 MG CAPS Take by mouth daily.       Lancets (ONETOUCH ULTRASOFT) lancets Use to check blood glucose two times daily 100 each 0   metFORMIN (GLUCOPHAGE) 500 MG tablet Take 1 tablet (500 mg total) by mouth daily with breakfast. 30 tablet 0   omeprazole (PRILOSEC) 20 MG capsule Take 20 mg by mouth daily.       omeprazole-sodium bicarbonate (ZEGERID) 40-1100 MG per capsule Take 1 capsule by mouth daily before breakfast.       Probiotic Product (PROBIOTIC DAILY PO) Take by mouth.     simvastatin (ZOCOR) 40 MG tablet Take 1 tablet (40 mg total) by mouth at bedtime. 30 tablet 2   valsartan (DIOVAN) 80 MG tablet Take 1 tablet (80 mg total) by mouth 2 (two) times daily. 180 tablet 0   Vitamin D, Ergocalciferol, (DRISDOL) 1.25 MG (50000 UT) CAPS capsule Take 1 capsule (50,000 Units total) by mouth every 7 (seven) days. 4 capsule 0   No current facility-administered medications on file prior to visit.     PAST MEDICAL HISTORY: Past Medical History:    Diagnosis Date   Arthritis    Back pain    Breast cancer ([place]) 2012   Cancer [facility])    colon   Diabetes mellitus    Family history of breast cancer    Family history of colon cancer    GERD (gastroesophageal reflux disease)    Hyperlipidemia    Hypertension    Joint pain    Kidney problem    Lactose intolerance    Neuromuscular disorder ([place])    back, hip, leg left side   Obesity    Personal history of radiation therapy 2012   Sleep apnea    Spinal stenosis     PAST SURGICAL HISTORY: Past Surgical History:  Procedure Laterality Date   ABDOMINAL HYSTERECTOMY  BREAST EXCISIONAL BIOPSY Left 1972   BREAST LUMPECTOMY Right 2012   BREAST SURGERY     biopsy   COLON SURGERY     partial colectomy   X-STOP IMPLANTATION  September 26th,2012    SOCIAL HISTORY: Social History   Tobacco Use   Smoking status: Former Smoker   Smokeless tobacco: Never Used  Substance Use Topics   Alcohol use: Yes   Drug use: No    FAMILY HISTORY: Family History  Problem Relation Age of Onset   Breast cancer Mother        dx late 39's   Hyperlipidemia Mother    Heart disease Mother    Liver disease Mother    Breast cancer Sister 63   Heart disease Father    Obesity Father    Hypertension Father    Diabetes Father    Heart disease Maternal Grandmother    Colon cancer Cousin        dx 70+   Kidney cancer Son 29   Breast cancer Sister    ROS: Review of Systems  Gastrointestinal: Negative for nausea and vomiting.  Musculoskeletal:       Negative for muscle weakness.   PHYSICAL EXAM: Blood pressure (!) 142/84, pulse (!) 55, temperature 97.9 F (36.6 C), temperature source Oral, height '5\' 4"'$  (1.626 m), weight 235 lb (106.6 kg), SpO2 97 %. Body mass index is 40.34 kg/m. Physical Exam Vitals signs reviewed.  Constitutional:      Appearance: Normal appearance. She is obese.  Cardiovascular:     Rate and Rhythm: Normal rate.      Pulses: Normal pulses.  Pulmonary:     Effort: Pulmonary effort is normal.     Breath sounds: Normal breath sounds.  Musculoskeletal: Normal range of motion.  Skin:    General: Skin is warm and dry.  Neurological:     Mental Status: She is alert and oriented to person, place, and time.  Psychiatric:        Behavior: Behavior normal.   RECENT LABS AND TESTS: BMET    Component Value Date/Time   NA 141 01/11/2019 1416   NA 141 12/14/2014 0747   K 4.5 01/11/2019 1416   K 4.2 12/14/2014 0747   CL 99 01/11/2019 1416   CO2 25 01/11/2019 1416   CO2 28 12/14/2014 0747   GLUCOSE 111 (H) 01/11/2019 1416   GLUCOSE 120 12/14/2014 0747   BUN 27 01/11/2019 1416   BUN 21.3 12/14/2014 0747   CREATININE 0.76 01/11/2019 1416   CREATININE 0.9 12/14/2014 0747   CALCIUM 9.7 01/11/2019 1416   CALCIUM 9.4 12/14/2014 0747   GFRNONAA 76 01/11/2019 1416   GFRAA 88 01/11/2019 1416   Lab Results  Component Value Date   HGBA1C 5.9 (H) 01/11/2019   HGBA1C 6.2 (H) 08/24/2018   HGBA1C 6.8 (H) 04/12/2018   Lab Results  Component Value Date   INSULIN 18.5 01/11/2019   INSULIN 24.9 08/24/2018   INSULIN 30.6 (H) 04/12/2018   CBC    Component Value Date/Time   WBC 7.1 04/12/2018 1051   WBC 8.0 12/14/2014 0746   WBC 6.1 03/04/2011 1040   RBC 4.27 04/12/2018 1051   RBC 4.17 12/14/2014 0746   RBC 3.99 03/04/2011 1040   HGB 12.8 04/12/2018 1051   HGB 12.4 12/14/2014 0746   HCT 37.8 04/12/2018 1051   HCT 37.0 12/14/2014 0746   PLT 230 12/14/2014 0746   MCV 89 04/12/2018 1051   MCV 88.9 12/14/2014 0746  MCH 30.0 04/12/2018 1051   MCH 29.8 12/14/2014 0746   MCH 30.1 03/04/2011 1040   MCHC 33.9 04/12/2018 1051   MCHC 33.5 12/14/2014 0746   MCHC 33.1 03/04/2011 1040   RDW 12.4 04/12/2018 1051   RDW 12.5 12/14/2014 0746   LYMPHSABS 2.0 04/12/2018 1051   LYMPHSABS 1.8 12/14/2014 0746   MONOABS 0.5 12/14/2014 0746   EOSABS 0.2 04/12/2018 1051   BASOSABS 0.0 04/12/2018 1051   BASOSABS 0.1  12/14/2014 0746   Iron/TIBC/Ferritin/ %Sat No results found for: IRON, TIBC, FERRITIN, IRONPCTSAT Lipid Panel     Component Value Date/Time   CHOL 147 01/11/2019 1416   TRIG 80 01/11/2019 1416   HDL 58 01/11/2019 1416   LDLCALC 73 01/11/2019 1416   Hepatic Function Panel     Component Value Date/Time   PROT 7.2 01/11/2019 1416   PROT 7.1 12/14/2014 0747   ALBUMIN 4.4 01/11/2019 1416   ALBUMIN 3.8 12/14/2014 0747   AST 14 01/11/2019 1416   AST 16 12/14/2014 0747   ALT 12 01/11/2019 1416   ALT 15 12/14/2014 0747   ALKPHOS 63 01/11/2019 1416   ALKPHOS 52 12/14/2014 0747   BILITOT 0.3 01/11/2019 1416   BILITOT 0.40 12/14/2014 0747      Component Value Date/Time   TSH 1.480 04/12/2018 1051   Results for VALYNN, SCHAMBERGER (MRN [id number]) as of 03/16/2019 14:59  Ref. Range 01/11/2019 14:16  Vitamin D, 25-Hydroxy Latest Ref Range: 30.0 - 100.0 ng/mL 50.3   OBESITY BEHAVIORAL INTERVENTION VISIT  Today's visit was #22  Starting weight: 259 lbs Starting date: 04/12/2018 Today's weight: 235 lbs  Today's date: 03/16/2019 Total lbs lost to date: 24 At least 15 minutes were spent on discussing the following behavioral intervention visit.    03/16/2019  Height _0  (1.626 m)  Weight 235 lb (106.6 kg)  BMI (Calculated) 40.32  BLOOD PRESSURE - SYSTOLIC 320  BLOOD PRESSURE - DIASTOLIC 84   Body Fat % 52 %  Total Body Water (lbs) 84 lbs   ASK: We discussed the diagnosis of obesity with [name] today and Kathleen [name] agreed to give [name] permission to discuss obesity behavioral modification therapy today.  ASSESS: Kathleen [name] has the diagnosis of obesity and her BMI today is 40.3. Kathleen [name] is in the action stage of change.   ADVISE: Kathleen [name] was educated on the multiple health risks of obesity as well as the benefit of weight loss to improve her health. She was advised of the need for long term treatment and the importance of lifestyle modifications to improve her current  health and to decrease her risk of future health problems.  AGREE: Multiple dietary modification options and treatment options were discussed and  Kathleen [name] agreed to follow the recommendations documented in the above note.  ARRANGE: Kathleen [name] was educated on the importance of frequent visits to treat obesity as outlined per CMS and USPSTF guidelines and agreed to schedule her next follow up appointment today.  Migdalia Dk, am acting as transcriptionist for Abby Potash, PA-C I, Abby Potash, PA-C have reviewed above note and agree with its content

## 2019-04-07 ENCOUNTER — Encounter: Payer: Self-pay | Admitting: Physician Assistant

## 2019-04-07 ENCOUNTER — Ambulatory Visit (INDEPENDENT_AMBULATORY_CARE_PROVIDER_SITE_OTHER): Payer: Medicare Other | Admitting: Physician Assistant

## 2019-04-07 ENCOUNTER — Encounter (INDEPENDENT_AMBULATORY_CARE_PROVIDER_SITE_OTHER): Payer: Self-pay | Admitting: Physician Assistant

## 2019-04-07 ENCOUNTER — Other Ambulatory Visit: Payer: Self-pay

## 2019-04-07 VITALS — BP 123/74 | HR 56 | Temp 98.0°F | Ht 64.0 in | Wt 232.0 lb

## 2019-04-07 DIAGNOSIS — Z6841 Body Mass Index (BMI) 40.0 and over, adult: Secondary | ICD-10-CM | POA: Diagnosis not present

## 2019-04-07 DIAGNOSIS — E119 Type 2 diabetes mellitus without complications: Secondary | ICD-10-CM

## 2019-04-07 DIAGNOSIS — R7303 Prediabetes: Secondary | ICD-10-CM | POA: Diagnosis not present

## 2019-04-07 DIAGNOSIS — I10 Essential (primary) hypertension: Secondary | ICD-10-CM | POA: Diagnosis not present

## 2019-04-07 DIAGNOSIS — E559 Vitamin D deficiency, unspecified: Secondary | ICD-10-CM | POA: Diagnosis not present

## 2019-04-07 DIAGNOSIS — R0602 Shortness of breath: Secondary | ICD-10-CM | POA: Diagnosis not present

## 2019-04-07 MED ORDER — METFORMIN HCL 500 MG PO TABS: 500.0000 mg | ORAL_TABLET | Freq: Every day | ORAL | 0 refills | Status: DC

## 2019-04-07 MED ORDER — VALSARTAN 80 MG PO TABS: 80.0000 mg | ORAL_TABLET | Freq: Two times a day (BID) | ORAL | 0 refills | Status: DC

## 2019-04-07 MED ORDER — VITAMIN D (ERGOCALCIFEROL) 1.25 MG (50000 UNIT) PO CAPS: 50000.0000 [IU] | ORAL_CAPSULE | ORAL | 0 refills | Status: DC

## 2019-04-10 NOTE — Progress Notes (Signed)
Office: [phone]  /  Fax: [phone]   HPI:   Chief Complaint: [place] is here to discuss her progress with her obesity treatment plan. She is on the  keep a food journal with 1500 calories and 95g of protein daily and is following her eating plan approximately 60 % of the time. She states she is exercising on the bike for 20-30 minutes 5-6 times per week. Kathleen [name] just returned from [employer] where she ate on her plan pretty well. She is seeing her PCP for checkup in Nov.   Her weight is 232 lb (105.2 kg) [name] and has had a weight loss of 3 pounds over a period of 4 weeks since her last visit. She has lost 27 lbs since starting treatment with [name].  Vitamin D deficiency Kathleen [name] has a diagnosis of vitamin D deficiency. She is currently taking vit D and denies nausea, vomiting or muscle weakness.  [place] has a diagnosis of prediabetes based on her elevated HgA1c and was informed this puts her at greater risk of developing diabetes. She is taking metformin currently and continues to work on diet and exercise to decrease risk of diabetes. Her fasting BG 116-149. She denies nausea or hypoglycemia.  Hypertension Kathleen [name] is a 78 y.o. female with hypertension.  Kathleen [name] B Sonnen denies chest pain or shortness of breath on exertion. She is working weight loss to help control her blood pressure with the goal of decreasing her risk of heart attack and stroke. Kathleen [name] blood pressure is currently controlled.  Dyspnea with Exercise Kathleen [name] notes increasing shortness of breath with exercising and seems to be worsening over time with weight gain. She notes getting out of breath sooner with activity than she used to. This has not gotten worse recently. Kathleen [name] denies shortness of breath at rest or orthopnea. She denies dizziness.   ASSESSMENT AND PLAN:  SOB (shortness of breath) on exertion  Vitamin D deficiency - Plan: Vitamin D, Ergocalciferol, (DRISDOL)  1.25 MG (50000 UT) CAPS capsule  Prediabetes  Essential hypertension - Plan: valsartan (DIOVAN) 80 MG tablet  Class 3 severe obesity with serious comorbidity and body mass index (BMI) of 40.0 to 44.9 in adult, unspecified obesity type ([place])  Type 2 diabetes mellitus without complication, without long-term current use of insulin ([place]) - Plan: metFORMIN (GLUCOPHAGE) 500 MG tablet  PLAN:  Vitamin D Deficiency Kathleen [name] was informed that low vitamin D levels contributes to fatigue and are associated with obesity, breast, and colon cancer. She agrees to continue to take prescription Vit D _0 ,000 IU every week #4 with no refills and will follow up for routine testing of vitamin D, at least 2-3 times per year. She was informed of the risk of over-replacement of vitamin D and agrees to not increase her dose unless she discusses this with [name].  Pre-Diabetes Kathleen [name] will continue to work on weight loss, exercise, and decreasing simple carbohydrates in her diet to help decrease the risk of diabetes. We dicussed metformin including benefits and risks. She was informed that eating too many simple carbohydrates or too many calories at one sitting increases the likelihood of GI side effects. Kathleen [name] agrees to continue Metformin 500 mg qd #30 with no refills. Kathleen [name] agreed to follow up with [name] as directed to monitor her progress.  Hypertension We discussed sodium restriction, working on healthy weight loss, and a regular exercise program as the means to achieve improved blood pressure control. Kathleen [name] agreed with this plan and agreed to  follow up as directed. We will continue to monitor her blood pressure as well as her progress with the above lifestyle modifications. She will continue her Valsartan 80 mg BID #180 with no refills and will watch for signs of hypotension as she continues her lifestyle modifications.  Dyspnea with exercise Kathleen [name] shortness of breath appears to be obesity related and  exercise induced. The indirect calorimeter results showed VO2 of 252 and a REE of 1755. She has agreed to work on weight loss and gradually increase exercise to treat her exercise induced shortness of breath. If Kathleen [name] follows our instructions and loses weight without improvement of her shortness of breath, we will plan to refer to pulmonology. Kathleen [name] agrees to this plan.  Obesity Kathleen [name] is currently in the action stage of change. As such, her goal is to continue with weight loss efforts She has agreed to keep a food journal with 1400-1500 calories and 100g of protein daily.  Kathleen [name] has been instructed to work up to a goal of 150 minutes of combined cardio and strengthening exercise per week for weight loss and overall health benefits. We discussed the following Behavioral Modification Strategies [name]: work on meal planning and easy cooking plans and keeping healthy foods in the home.    Kathleen [name] has agreed to follow up with our clinic in 3 weeks. She was informed of the importance of frequent follow up visits to maximize her success with intensive lifestyle modifications for her multiple health conditions.  ALLERGIES: Allergies  Allergen Reactions  . Mobic [Meloxicam] Shortness Of Breath    "Wheezing"   . Naproxen Sodium Swelling    Hands and feet  . Tape     Surgical tape adhesive and paper tape   . Doxycycline Itching and Rash    MEDICATIONS: Current Outpatient Medications on File Prior to Visit  Medication Sig Dispense Refill  . acetaminophen (TYLENOL) 500 MG tablet Take 500 mg by mouth every 6 (six) hours as needed. 1-2 tablets q 4-6 hrs            . aspirin 81 MG tablet Take 81 mg by mouth daily.    . betamethasone dipropionate (DIPROLENE) 0.05 % cream Apply topically 1 day or 1 dose.      . bisoprolol (ZEBETA) 5 MG tablet     . Blood Glucose Monitoring Suppl (ONE TOUCH ULTRA 2) w/Device KIT Use to check blood sugar two times a day 1 kit 0  . chlorthalidone  (HYGROTON) 50 MG tablet Take 1 tablet (50 mg total) by mouth daily. 30 tablet 0  . fexofenadine (ALLEGRA) 180 MG tablet Take 180 mg by mouth daily.     Marland Kitchen FIBER ADULT GUMMIES 2 g CHEW Chew 2 tablets by mouth daily.    . Glucosamine HCl 1500 MG TABS Take 2 tablets by mouth daily.    Marland Kitchen glucose blood test strip Use to check glucose two times daily 100 each 0  . ibuprofen (ADVIL,MOTRIN) 200 MG tablet Take 200 mg by mouth every 6 (six) hours as needed.      Javier Docker Oil 300 MG CAPS Take by mouth daily.      . Lancets (ONETOUCH ULTRASOFT) lancets Use to check blood glucose two times daily 100 each 0  . omeprazole (PRILOSEC) 20 MG capsule Take 20 mg by mouth daily.      Marland Kitchen omeprazole-sodium bicarbonate (ZEGERID) 40-1100 MG per capsule Take 1 capsule by mouth daily before breakfast.      . Probiotic Product (PROBIOTIC DAILY  PO) Take by mouth.    . simvastatin (ZOCOR) 40 MG tablet Take 1 tablet (40 mg total) by mouth at bedtime. 30 tablet 2   No current facility-administered medications on file prior to visit.     PAST MEDICAL HISTORY: Past Medical History:  Diagnosis Date  . Arthritis   . Back pain   . Breast cancer ([place]) 2012  . Cancer ([place])    colon  . Diabetes mellitus   . Family history of breast cancer   . Family history of colon cancer   . GERD (gastroesophageal reflux disease)   . Hyperlipidemia   . Hypertension   . Joint pain   . Kidney problem   . Lactose intolerance   . Neuromuscular disorder ([place])    back, hip, leg left side  . Obesity   . Personal history of radiation therapy 2012  . Sleep apnea   . Spinal stenosis     PAST SURGICAL HISTORY: Past Surgical History:  Procedure Laterality Date  . ABDOMINAL HYSTERECTOMY    . BREAST EXCISIONAL BIOPSY Left 1972  . BREAST LUMPECTOMY Right 2012  . BREAST SURGERY     biopsy  . COLON SURGERY     partial colectomy  . X-STOP IMPLANTATION  September 26th,2012    SOCIAL HISTORY: Social History   Tobacco Use  . Smoking  status: Former Research scientist (life sciences)  . Smokeless tobacco: Never Used  Substance Use Topics  . Alcohol use: Yes  . Drug use: No    FAMILY HISTORY: Family History  Problem Relation Age of Onset  . Breast cancer Mother        dx late 16's  . Hyperlipidemia Mother   . Heart disease Mother   . Liver disease Mother   . Breast cancer Sister 35  . Heart disease Father   . Obesity Father   . Hypertension Father   . Diabetes Father   . Heart disease Maternal Grandmother   . Colon cancer Cousin        dx 65+  . Kidney cancer Son 55  . Breast cancer Sister     ROS: Review of Systems  Constitutional: Positive for weight loss.  Respiratory: Positive for shortness of breath.   Cardiovascular: Negative for chest pain.  Gastrointestinal: Negative for nausea and vomiting.  Musculoskeletal:       Negative for muscle weakness  Endo/Heme/Allergies:       Negative for hypoglycemia     PHYSICAL EXAM: Blood pressure 123/74, pulse (!) 56, temperature 98 F (36.7 C), temperature source Oral, height _0  (1.626 m), weight 232 lb (105.2 kg), SpO2 96 %. Body mass index is 39.82 kg/m. Physical Exam Vitals signs reviewed.  Constitutional:      Appearance: Normal appearance. She is obese.  HENT:     Head: Normocephalic.     Nose: Nose normal.  Neck:     Musculoskeletal: Normal range of motion.  Cardiovascular:     Rate and Rhythm: Normal rate.  Pulmonary:     Effort: Pulmonary effort is normal.  Musculoskeletal: Normal range of motion.  Skin:    General: Skin is warm and dry.  Neurological:     Mental Status: She is alert and oriented to person, place, and time.  Psychiatric:        Mood and Affect: Mood normal.        Behavior: Behavior normal.     RECENT LABS AND TESTS: BMET    Component Value Date/Time   NA 141  01/11/2019 1416   NA 141 12/14/2014 0747   K 4.5 01/11/2019 1416   K 4.2 12/14/2014 0747   CL 99 01/11/2019 1416   CO2 25 01/11/2019 1416   CO2 28 12/14/2014 0747    GLUCOSE 111 (H) 01/11/2019 1416   GLUCOSE 120 12/14/2014 0747   BUN 27 01/11/2019 1416   BUN 21.3 12/14/2014 0747   CREATININE 0.76 01/11/2019 1416   CREATININE 0.9 12/14/2014 0747   CALCIUM 9.7 01/11/2019 1416   CALCIUM 9.4 12/14/2014 0747   GFRNONAA 76 01/11/2019 1416   GFRAA 88 01/11/2019 1416   Lab Results  Component Value Date   HGBA1C 5.9 (H) 01/11/2019   HGBA1C 6.2 (H) 08/24/2018   HGBA1C 6.8 (H) 04/12/2018   Lab Results  Component Value Date   INSULIN 18.5 01/11/2019   INSULIN 24.9 08/24/2018   INSULIN 30.6 (H) 04/12/2018   CBC    Component Value Date/Time   WBC 7.1 04/12/2018 1051   WBC 8.0 12/14/2014 0746   WBC 6.1 03/04/2011 1040   RBC 4.27 04/12/2018 1051   RBC 4.17 12/14/2014 0746   RBC 3.99 03/04/2011 1040   HGB 12.8 04/12/2018 1051   HGB 12.4 12/14/2014 0746   HCT 37.8 04/12/2018 1051   HCT 37.0 12/14/2014 0746   PLT 230 12/14/2014 0746   MCV 89 04/12/2018 1051   MCV 88.9 12/14/2014 0746   MCH 30.0 04/12/2018 1051   MCH 29.8 12/14/2014 0746   MCH 30.1 03/04/2011 1040   MCHC 33.9 04/12/2018 1051   MCHC 33.5 12/14/2014 0746   MCHC 33.1 03/04/2011 1040   RDW 12.4 04/12/2018 1051   RDW 12.5 12/14/2014 0746   LYMPHSABS 2.0 04/12/2018 1051   LYMPHSABS 1.8 12/14/2014 0746   MONOABS 0.5 12/14/2014 0746   EOSABS 0.2 04/12/2018 1051   BASOSABS 0.0 04/12/2018 1051   BASOSABS 0.1 12/14/2014 0746   Iron/TIBC/Ferritin/ %Sat No results found for: IRON, TIBC, FERRITIN, IRONPCTSAT Lipid Panel     Component Value Date/Time   CHOL 147 01/11/2019 1416   TRIG 80 01/11/2019 1416   HDL 58 01/11/2019 1416   LDLCALC 73 01/11/2019 1416   Hepatic Function Panel     Component Value Date/Time   PROT 7.2 01/11/2019 1416   PROT 7.1 12/14/2014 0747   ALBUMIN 4.4 01/11/2019 1416   ALBUMIN 3.8 12/14/2014 0747   AST 14 01/11/2019 1416   AST 16 12/14/2014 0747   ALT 12 01/11/2019 1416   ALT 15 12/14/2014 0747   ALKPHOS 63 01/11/2019 1416   ALKPHOS 52 12/14/2014  0747   BILITOT 0.3 01/11/2019 1416   BILITOT 0.40 12/14/2014 0747      Component Value Date/Time   TSH 1.480 04/12/2018 1051     Ref. Range 01/11/2019 14:16  Vitamin D, 25-Hydroxy Latest Ref Range: 30.0 - 100.0 ng/mL 50.3     OBESITY BEHAVIORAL INTERVENTION VISIT  [name]'s visit was #23  Starting weight: 259 lbs Starting date: 04/12/18 [name]'s weight : Weight: 232 lb (105.2 kg)  [name]'s date: 04/07/19 Total lbs lost to date: 27 lbs At least 15 minutes were spent on discussing the following behavioral intervention visit.   ASK: We discussed the diagnosis of obesity with Kathleen [name] and Kathleen [name] agreed to give [name] permission to discuss obesity behavioral modification therapy [name].  ASSESS: Kathleen [name] has the diagnosis of obesity and her BMI [name] is 41.8 Kathleen [name] is in the action stage of change   ADVISE: Kathleen [name] was educated on the multiple health risks of  obesity as well as the benefit of weight loss to improve her health. She was advised of the need for long term treatment and the importance of lifestyle modifications to improve her current health and to decrease her risk of future health problems.  AGREE: Multiple dietary modification options and treatment options were discussed and  Kathleen [name] agreed to follow the recommendations documented in the above note.  ARRANGE: Kathleen [name] was educated on the importance of frequent visits to treat obesity as outlined per CMS and USPSTF guidelines and agreed to schedule her next follow up appointment [name].  Leary Roca, am acting as transcriptionist for Abby Potash, PA-C I, Abby Potash, PA-C have reviewed above note and agree with its content

## 2019-04-26 ENCOUNTER — Ambulatory Visit (INDEPENDENT_AMBULATORY_CARE_PROVIDER_SITE_OTHER): Payer: Medicare Other | Admitting: Physician Assistant

## 2019-04-26 ENCOUNTER — Encounter (INDEPENDENT_AMBULATORY_CARE_PROVIDER_SITE_OTHER): Payer: Self-pay | Admitting: Physician Assistant

## 2019-04-26 ENCOUNTER — Other Ambulatory Visit: Payer: Self-pay

## 2019-04-26 VITALS — BP 113/67 | HR 55 | Temp 98.0°F | Ht 64.0 in | Wt 236.0 lb

## 2019-04-26 DIAGNOSIS — E559 Vitamin D deficiency, unspecified: Secondary | ICD-10-CM | POA: Diagnosis not present

## 2019-04-26 DIAGNOSIS — E119 Type 2 diabetes mellitus without complications: Secondary | ICD-10-CM

## 2019-04-26 DIAGNOSIS — Z6841 Body Mass Index (BMI) 40.0 and over, adult: Secondary | ICD-10-CM | POA: Diagnosis not present

## 2019-04-26 MED ORDER — METFORMIN HCL 500 MG PO TABS: 500.0000 mg | ORAL_TABLET | Freq: Every day | ORAL | 0 refills | Status: DC

## 2019-04-27 NOTE — Progress Notes (Signed)
Office: [phone]  /  Fax: [phone]   HPI:   Chief Complaint: Kathleen [name] is here to discuss her progress with her obesity treatment plan. She is on the keep a food journal with 1200 to 1400 calories and 100 grams of protein daily plan and the Category 3 plan and is following her eating plan approximately 80 % of the time. She states she is riding a stationary bike for 30 minutes 5 times per week. Kathleen [name] is retaining some fluid today after eating chips and eating out recently. She is exercising regularly, but she is only averaging 1200 to 1250 calories. Her weight is 236 lb (107 kg) today and has had a weight gain of 4 pounds over a period of 2 to 3 weeks since her last visit. She has lost 23 lbs since starting treatment with [name].  Diabetes II Kathleen [name] has a diagnosis of diabetes type II. She is on metformin and she denies nausea, vomiting or diarrhea. Kathleen [name] states fasting BGs range between 110 and 130's. Last A1c was at 5.9 She has been working on intensive lifestyle modifications including diet, exercise, and weight loss to help control her blood glucose levels.  Vitamin D deficiency Kathleen [name] has a diagnosis of vitamin D deficiency. Kathleen [name] is currently taking vit D and denies nausea, vomiting or muscle weakness.  ASSESSMENT AND PLAN:  Type 2 diabetes mellitus without complication, without long-term current use of insulin ([place]) - Plan: metFORMIN (GLUCOPHAGE) 500 MG tablet  Vitamin D deficiency  Class 3 severe obesity with serious comorbidity and body mass index (BMI) of 40.0 to 44.9 in adult, unspecified obesity type ([place])  PLAN:  Diabetes II Kathleen [name] has been given extensive diabetes education by myself today including ideal fasting and post-prandial blood glucose readings, individual ideal Hgb A1c goals and hypoglycemia prevention. We discussed the importance of good blood sugar control to decrease the likelihood of diabetic complications such as nephropathy,  neuropathy, limb loss, blindness, coronary artery disease, and death. We discussed the importance of intensive lifestyle modification including diet, exercise and weight loss as the first line treatment for diabetes. Kathleen [name] agrees to continue metformin 500 mg daily with breakfast #30 with no refills and follow up at the agreed upon time.  Vitamin D Deficiency Kathleen [name] was informed that low vitamin D levels contributes to fatigue and are associated with obesity, breast, and colon cancer. Kathleen [name] will continue to take prescription Vit D @50 ,000 IU every week and she will follow up for routine testing of vitamin D, at least 2-3 times per year. She was informed of the risk of over-replacement of vitamin D and agrees to not increase her dose unless she discusses this with [name].  Obesity Kathleen [name] is currently in the action stage of change. As such, her goal is to continue with weight loss efforts She has agreed to keep a food journal with 1400 to 1500 calories and 95 grams of protein daily Kathleen [name] has been instructed to work up to a goal of 150 minutes of combined cardio and strengthening exercise per week for weight loss and overall health benefits. We discussed the following Behavioral Modification Strategies today: keeping healthy foods in the home, work on meal planning and easy cooking plans and holiday eating strategies   Kathleen [name] has agreed to follow up with our clinic in 3 weeks. She was informed of the importance of frequent follow up visits to maximize her success with intensive lifestyle modifications for her multiple health conditions.  ALLERGIES: Allergies  Allergen Reactions  Mobic [Meloxicam] Shortness Of Breath    "Wheezing"    Naproxen Sodium Swelling    Hands and feet   Tape     Surgical tape adhesive and paper tape    Doxycycline Itching and Rash    MEDICATIONS: Current Outpatient Medications on File Prior to Visit  Medication Sig Dispense Refill    acetaminophen (TYLENOL) 500 MG tablet Take 500 mg by mouth every 6 (six) hours as needed. 1-2 tablets q 4-6 hrs             aspirin 81 MG tablet Take 81 mg by mouth daily.     betamethasone dipropionate (DIPROLENE) 0.05 % cream Apply topically 1 day or 1 dose.       bisoprolol (ZEBETA) 5 MG tablet      Blood Glucose Monitoring Suppl (ONE TOUCH ULTRA 2) w/Device KIT Use to check blood sugar two times a day 1 kit 0   chlorthalidone (HYGROTON) 50 MG tablet Take 1 tablet (50 mg total) by mouth daily. 30 tablet 0   fexofenadine (ALLEGRA) 180 MG tablet Take 180 mg by mouth daily.      FIBER ADULT GUMMIES 2 g CHEW Chew 2 tablets by mouth daily.     Glucosamine HCl 1500 MG TABS Take 2 tablets by mouth daily.     glucose blood test strip Use to check glucose two times daily 100 each 0   ibuprofen (ADVIL,MOTRIN) 200 MG tablet Take 200 mg by mouth every 6 (six) hours as needed.       Krill Oil 300 MG CAPS Take by mouth daily.       Lancets (ONETOUCH ULTRASOFT) lancets Use to check blood glucose two times daily 100 each 0   omeprazole (PRILOSEC) 20 MG capsule Take 20 mg by mouth daily.       omeprazole-sodium bicarbonate (ZEGERID) 40-1100 MG per capsule Take 1 capsule by mouth daily before breakfast.       Probiotic Product (PROBIOTIC DAILY PO) Take by mouth.     simvastatin (ZOCOR) 40 MG tablet Take 1 tablet (40 mg total) by mouth at bedtime. 30 tablet 2   valsartan (DIOVAN) 80 MG tablet Take 1 tablet (80 mg total) by mouth 2 (two) times daily. 180 tablet 0   Vitamin D, Ergocalciferol, (DRISDOL) 1.25 MG (50000 UT) CAPS capsule Take 1 capsule (50,000 Units total) by mouth every 7 (seven) days. 4 capsule 0   No current facility-administered medications on file prior to visit.     PAST MEDICAL HISTORY: Past Medical History:  Diagnosis Date   Arthritis    Back pain    Breast cancer ([place]) 2012   Cancer [facility])    colon   Diabetes mellitus    Family history of breast  cancer    Family history of colon cancer    GERD (gastroesophageal reflux disease)    Hyperlipidemia    Hypertension    Joint pain    Kidney problem    Lactose intolerance    Neuromuscular disorder ([place])    back, hip, leg left side   Obesity    Personal history of radiation therapy 2012   Sleep apnea    Spinal stenosis     PAST SURGICAL HISTORY: Past Surgical History:  Procedure Laterality Date   ABDOMINAL HYSTERECTOMY     BREAST EXCISIONAL BIOPSY Left 1972   BREAST LUMPECTOMY Right 2012   BREAST SURGERY     biopsy   COLON SURGERY     partial colectomy  X-STOP IMPLANTATION  September 26th,2012    SOCIAL HISTORY: Social History   Tobacco Use   Smoking status: Former Smoker   Smokeless tobacco: Never Used  Substance Use Topics   Alcohol use: Yes   Drug use: No    FAMILY HISTORY: Family History  Problem Relation Age of Onset   Breast cancer Mother        dx late 13's   Hyperlipidemia Mother    Heart disease Mother    Liver disease Mother    Breast cancer Sister 21   Heart disease Father    Obesity Father    Hypertension Father    Diabetes Father    Heart disease Maternal Grandmother    Colon cancer Cousin        dx 70+   Kidney cancer Son 63   Breast cancer Sister     ROS: Review of Systems  Constitutional: Negative for weight loss.  Gastrointestinal: Negative for diarrhea, nausea and vomiting.  Musculoskeletal:       Negative for muscle weakness    PHYSICAL EXAM: Blood pressure 113/67, pulse (!) 55, temperature 98 F (36.7 C), temperature source Oral, height 5' 4"  (1.626 m), weight 236 lb (107 kg), SpO2 98 %. Body mass index is 40.51 kg/m. Physical Exam Vitals signs reviewed.  Constitutional:      Appearance: Normal appearance. She is well-developed. She is obese.  Cardiovascular:     Rate and Rhythm: Normal rate.  Pulmonary:     Effort: Pulmonary effort is normal.  Musculoskeletal: Normal range of  motion.  Skin:    General: Skin is warm and dry.  Neurological:     Mental Status: She is alert and oriented to person, place, and time.  Psychiatric:        Mood and Affect: Mood normal.        Behavior: Behavior normal.     RECENT LABS AND TESTS: BMET    Component Value Date/Time   NA 141 01/11/2019 1416   NA 141 12/14/2014 0747   K 4.5 01/11/2019 1416   K 4.2 12/14/2014 0747   CL 99 01/11/2019 1416   CO2 25 01/11/2019 1416   CO2 28 12/14/2014 0747   GLUCOSE 111 (H) 01/11/2019 1416   GLUCOSE 120 12/14/2014 0747   BUN 27 01/11/2019 1416   BUN 21.3 12/14/2014 0747   CREATININE 0.76 01/11/2019 1416   CREATININE 0.9 12/14/2014 0747   CALCIUM 9.7 01/11/2019 1416   CALCIUM 9.4 12/14/2014 0747   GFRNONAA 76 01/11/2019 1416   GFRAA 88 01/11/2019 1416   Lab Results  Component Value Date   HGBA1C 5.9 (H) 01/11/2019   HGBA1C 6.2 (H) 08/24/2018   HGBA1C 6.8 (H) 04/12/2018   Lab Results  Component Value Date   INSULIN 18.5 01/11/2019   INSULIN 24.9 08/24/2018   INSULIN 30.6 (H) 04/12/2018   CBC    Component Value Date/Time   WBC 7.1 04/12/2018 1051   WBC 8.0 12/14/2014 0746   WBC 6.1 03/04/2011 1040   RBC 4.27 04/12/2018 1051   RBC 4.17 12/14/2014 0746   RBC 3.99 03/04/2011 1040   HGB 12.8 04/12/2018 1051   HGB 12.4 12/14/2014 0746   HCT 37.8 04/12/2018 1051   HCT 37.0 12/14/2014 0746   PLT 230 12/14/2014 0746   MCV 89 04/12/2018 1051   MCV 88.9 12/14/2014 0746   MCH 30.0 04/12/2018 1051   MCH 29.8 12/14/2014 0746   MCH 30.1 03/04/2011 1040   MCHC 33.9 04/12/2018 1051  MCHC 33.5 12/14/2014 0746   MCHC 33.1 03/04/2011 1040   RDW 12.4 04/12/2018 1051   RDW 12.5 12/14/2014 0746   LYMPHSABS 2.0 04/12/2018 1051   LYMPHSABS 1.8 12/14/2014 0746   MONOABS 0.5 12/14/2014 0746   EOSABS 0.2 04/12/2018 1051   BASOSABS 0.0 04/12/2018 1051   BASOSABS 0.1 12/14/2014 0746   Iron/TIBC/Ferritin/ %Sat No results found for: IRON, TIBC, FERRITIN, IRONPCTSAT Lipid Panel       Component Value Date/Time   CHOL 147 01/11/2019 1416   TRIG 80 01/11/2019 1416   HDL 58 01/11/2019 1416   LDLCALC 73 01/11/2019 1416   Hepatic Function Panel     Component Value Date/Time   PROT 7.2 01/11/2019 1416   PROT 7.1 12/14/2014 0747   ALBUMIN 4.4 01/11/2019 1416   ALBUMIN 3.8 12/14/2014 0747   AST 14 01/11/2019 1416   AST 16 12/14/2014 0747   ALT 12 01/11/2019 1416   ALT 15 12/14/2014 0747   ALKPHOS 63 01/11/2019 1416   ALKPHOS 52 12/14/2014 0747   BILITOT 0.3 01/11/2019 1416   BILITOT 0.40 12/14/2014 0747      Component Value Date/Time   TSH 1.480 04/12/2018 1051     Ref. Range 01/11/2019 14:16  Vitamin D, 25-Hydroxy Latest Ref Range: 30.0 - 100.0 ng/mL 50.3    OBESITY BEHAVIORAL INTERVENTION VISIT  Today's visit was # 24   Starting weight: 259 lbs Starting date: 04/12/2018 Today's weight : 236 lbs Today's date: 04/26/2019 Total lbs lost to date: 23    04/26/2019  Height 5' 4"  (1.626 m)  Weight 236 lb (107 kg)  BMI (Calculated) 40.49  BLOOD PRESSURE - SYSTOLIC 947  BLOOD PRESSURE - DIASTOLIC 67   Body Fat % 09.6 %  Total Body Water (lbs) 85.6 lbs    ASK: We discussed the diagnosis of obesity with [name] today and Kathleen [name] agreed to give [name] permission to discuss obesity behavioral modification therapy today.  ASSESS: Kathleen [name] has the diagnosis of obesity and her BMI today is 69.49 Kathleen [name] is in the action stage of change   ADVISE: Kathleen [name] was educated on the multiple health risks of obesity as well as the benefit of weight loss to improve her health. She was advised of the need for long term treatment and the importance of lifestyle modifications to improve her current health and to decrease her risk of future health problems.  AGREE: Multiple dietary modification options and treatment options were discussed and  Kathleen [name] agreed to follow the recommendations documented in the above note.  ARRANGE: Kathleen [name] was educated on the  importance of frequent visits to treat obesity as outlined per CMS and USPSTF guidelines and agreed to schedule her next follow up appointment today.  Corey Skains, am acting as transcriptionist for Abby Potash, PA-C I, Abby Potash, PA-C have reviewed above note and agree with its content

## 2019-05-18 ENCOUNTER — Ambulatory Visit (INDEPENDENT_AMBULATORY_CARE_PROVIDER_SITE_OTHER): Payer: Medicare Other | Admitting: Physician Assistant

## 2019-05-18 ENCOUNTER — Other Ambulatory Visit: Payer: Self-pay

## 2019-05-18 ENCOUNTER — Encounter (INDEPENDENT_AMBULATORY_CARE_PROVIDER_SITE_OTHER): Payer: Self-pay | Admitting: Physician Assistant

## 2019-05-18 VITALS — BP 144/85 | HR 52 | Temp 97.9°F | Ht 64.0 in | Wt 232.0 lb

## 2019-05-18 DIAGNOSIS — E119 Type 2 diabetes mellitus without complications: Secondary | ICD-10-CM | POA: Diagnosis not present

## 2019-05-18 DIAGNOSIS — E7849 Other hyperlipidemia: Secondary | ICD-10-CM | POA: Diagnosis not present

## 2019-05-18 DIAGNOSIS — Z6839 Body mass index (BMI) 39.0-39.9, adult: Secondary | ICD-10-CM

## 2019-05-18 MED ORDER — METFORMIN HCL 500 MG PO TABS: 500.0000 mg | ORAL_TABLET | Freq: Every day | ORAL | 0 refills | Status: DC

## 2019-05-19 NOTE — Progress Notes (Signed)
Office: [phone]  /  Fax: [phone]   HPI:   Chief Complaint: Kathleen [name] is here to discuss her progress with her obesity treatment plan. She is on the keep a food journal with 1400 to 1500 calories and 95 grams of protein daily plan and she is following her eating plan approximately 90 % of the time. She states she is biking 20 to 30 minutes 7 times per week. Kathleen [name] is doing very well staying within her calories and meeting her protein goal. She is using new recipes to keep things interesting. Her weight is 232 lb (105.2 kg) today and has had a weight loss of 4 pounds over a period of 3 weeks since her last visit. She has lost 27 lbs since starting treatment with [name].  Diabetes II Kathleen [name] has a diagnosis of diabetes type II. Kathleen [name] states fasting BGs range between 114 and 140 and she denies hypoglycemia or polyphagia. Last A1c was at 5.9 (01/11/19). Kathleen [name] is on metformin and she denies nausea, vomiting or diarrhea. She has been working on intensive lifestyle modifications including diet, exercise, and weight loss to help control her blood glucose levels.  Hyperlipidemia Kathleen [name] has hyperlipidemia and she is on Zocor. She has been trying to improve her cholesterol levels with intensive lifestyle modification including a low saturated fat diet, exercise and weight loss. She denies any chest pain or muscle aches.  ASSESSMENT AND PLAN:  Type 2 diabetes mellitus without complication, without long-term current use of insulin ([place]) - Plan: metFORMIN (GLUCOPHAGE) 500 MG tablet  Other hyperlipidemia  Class 2 severe obesity with serious comorbidity and body mass index (BMI) of 39.0 to 39.9 in adult, unspecified obesity type ([place])  PLAN:  Diabetes II Kathleen [name] has been given diabetes education by myself including ideal fasting and post-prandial blood glucose readings, individual ideal Hgb A1c goals and hypoglycemia prevention. We discussed the importance of good blood sugar  control to decrease the likelihood of diabetic complications such as nephropathy, neuropathy, limb loss, blindness, coronary artery disease, and death. We discussed the importance of intensive lifestyle modification including diet, exercise and weight loss as the first line treatment for diabetes. Kathleen [name] agrees to continue metformin 500 mg daily with breakfast #30 with no refills and follow up as directed.  Hyperlipidemia Kathleen [name] was informed of the American Heart Association Guidelines emphasizing intensive lifestyle modifications as the first line treatment for hyperlipidemia. We discussed many lifestyle modifications today in depth, and Kathleen [name] will continue to work on decreasing saturated fats such as fatty red meat, butter and many fried foods. She will also increase vegetables and lean protein in her diet and continue to work on exercise and weight loss efforts. Kathleen [name] will continue with weight loss and Zocor and she will follow up as directed.  Obesity Kathleen [name] is currently in the action stage of change. As such, her goal is to continue with weight loss efforts She has agreed to keep a food journal with 1400 to 1500 calories and 95 grams of protein daily Kathleen [name] has been instructed to work up to a goal of 150 minutes of combined cardio and strengthening exercise per week for weight loss and overall health benefits. We discussed the following Behavioral Modification Strategies today: keeping healthy foods in the home and work on meal planning and easy [place] has agreed to follow up with our clinic in 3 weeks. She was informed of the importance of frequent follow up visits to maximize her success with intensive lifestyle modifications for her  multiple health conditions.  ALLERGIES: Allergies  Allergen Reactions   Mobic [Meloxicam] Shortness Of Breath    "Wheezing"    Naproxen Sodium Swelling    Hands and feet   Tape     Surgical tape adhesive and paper tape     Doxycycline Itching and Rash    MEDICATIONS: Current Outpatient Medications on File Prior to Visit  Medication Sig Dispense Refill   Magnesium 250 MG TABS Take 1 tablet by mouth daily.     acetaminophen (TYLENOL) 500 MG tablet Take 500 mg by mouth every 6 (six) hours as needed. 1-2 tablets q 4-6 hrs             aspirin 81 MG tablet Take 81 mg by mouth daily.     betamethasone dipropionate (DIPROLENE) 0.05 % cream Apply topically 1 day or 1 dose.       bisoprolol (ZEBETA) 5 MG tablet      Blood Glucose Monitoring Suppl (ONE TOUCH ULTRA 2) w/Device KIT Use to check blood sugar two times a day 1 kit 0   chlorthalidone (HYGROTON) 50 MG tablet Take 1 tablet (50 mg total) by mouth daily. 30 tablet 0   fexofenadine (ALLEGRA) 180 MG tablet Take 180 mg by mouth daily.      FIBER ADULT GUMMIES 2 g CHEW Chew 2 tablets by mouth daily.     Glucosamine HCl 1500 MG TABS Take 2 tablets by mouth daily.     glucose blood test strip Use to check glucose two times daily 100 each 0   ibuprofen (ADVIL,MOTRIN) 200 MG tablet Take 200 mg by mouth every 6 (six) hours as needed.       Krill Oil 300 MG CAPS Take by mouth daily.       Lancets (ONETOUCH ULTRASOFT) lancets Use to check blood glucose two times daily 100 each 0   omeprazole (PRILOSEC) 20 MG capsule Take 20 mg by mouth daily.       omeprazole-sodium bicarbonate (ZEGERID) 40-1100 MG per capsule Take 1 capsule by mouth daily before breakfast.       Probiotic Product (PROBIOTIC DAILY PO) Take by mouth.     simvastatin (ZOCOR) 40 MG tablet Take 1 tablet (40 mg total) by mouth at bedtime. 30 tablet 2   valsartan (DIOVAN) 80 MG tablet Take 1 tablet (80 mg total) by mouth 2 (two) times daily. 180 tablet 0   Vitamin D, Ergocalciferol, (DRISDOL) 1.25 MG (50000 UT) CAPS capsule Take 1 capsule (50,000 Units total) by mouth every 7 (seven) days. 4 capsule 0   No current facility-administered medications on file prior to visit.     PAST  MEDICAL HISTORY: Past Medical History:  Diagnosis Date   Arthritis    Back pain    Breast cancer ([place]) 2012   Cancer [facility])    colon   Diabetes mellitus    Family history of breast cancer    Family history of colon cancer    GERD (gastroesophageal reflux disease)    Hyperlipidemia    Hypertension    Joint pain    Kidney problem    Lactose intolerance    Neuromuscular disorder ([place])    back, hip, leg left side   Obesity    Personal history of radiation therapy 2012   Sleep apnea    Spinal stenosis     PAST SURGICAL HISTORY: Past Surgical History:  Procedure Laterality Date   ABDOMINAL HYSTERECTOMY     BREAST EXCISIONAL BIOPSY Left 1972  BREAST LUMPECTOMY Right 2012   BREAST SURGERY     biopsy   COLON SURGERY     partial colectomy   X-STOP IMPLANTATION  September 26th,2012    SOCIAL HISTORY: Social History   Tobacco Use   Smoking status: Former Smoker   Smokeless tobacco: Never Used  Substance Use Topics   Alcohol use: Yes   Drug use: No    FAMILY HISTORY: Family History  Problem Relation Age of Onset   Breast cancer Mother        dx late 4's   Hyperlipidemia Mother    Heart disease Mother    Liver disease Mother    Breast cancer Sister 38   Heart disease Father    Obesity Father    Hypertension Father    Diabetes Father    Heart disease Maternal Grandmother    Colon cancer Cousin        dx 73+   Kidney cancer Son 31   Breast cancer Sister     ROS: Review of Systems  Constitutional: Positive for weight loss.  Cardiovascular: Negative for chest pain.  Gastrointestinal: Negative for diarrhea, nausea and vomiting.  Musculoskeletal: Negative for myalgias.  Endo/Heme/Allergies:       Negative for polyphagia Negative for hypglycemia    PHYSICAL EXAM: Blood pressure (!) 144/85, pulse (!) 52, temperature 97.9 F (36.6 C), temperature source Oral, height 5' 4"  (1.626 m), weight 232 lb (105.2 kg), SpO2 98  %. Body mass index is 39.82 kg/m. Physical Exam Vitals signs reviewed.  Constitutional:      Appearance: Normal appearance. She is well-developed. She is obese.  Cardiovascular:     Rate and Rhythm: Normal rate.  Pulmonary:     Effort: Pulmonary effort is normal.  Musculoskeletal: Normal range of motion.  Skin:    General: Skin is warm and dry.  Neurological:     Mental Status: She is alert and oriented to person, place, and time.  Psychiatric:        Mood and Affect: Mood normal.        Behavior: Behavior normal.     RECENT LABS AND TESTS: BMET    Component Value Date/Time   NA 141 01/11/2019 1416   NA 141 12/14/2014 0747   K 4.5 01/11/2019 1416   K 4.2 12/14/2014 0747   CL 99 01/11/2019 1416   CO2 25 01/11/2019 1416   CO2 28 12/14/2014 0747   GLUCOSE 111 (H) 01/11/2019 1416   GLUCOSE 120 12/14/2014 0747   BUN 27 01/11/2019 1416   BUN 21.3 12/14/2014 0747   CREATININE 0.76 01/11/2019 1416   CREATININE 0.9 12/14/2014 0747   CALCIUM 9.7 01/11/2019 1416   CALCIUM 9.4 12/14/2014 0747   GFRNONAA 76 01/11/2019 1416   GFRAA 88 01/11/2019 1416   Lab Results  Component Value Date   HGBA1C 5.9 (H) 01/11/2019   HGBA1C 6.2 (H) 08/24/2018   HGBA1C 6.8 (H) 04/12/2018   Lab Results  Component Value Date   INSULIN 18.5 01/11/2019   INSULIN 24.9 08/24/2018   INSULIN 30.6 (H) 04/12/2018   CBC    Component Value Date/Time   WBC 7.1 04/12/2018 1051   WBC 8.0 12/14/2014 0746   WBC 6.1 03/04/2011 1040   RBC 4.27 04/12/2018 1051   RBC 4.17 12/14/2014 0746   RBC 3.99 03/04/2011 1040   HGB 12.8 04/12/2018 1051   HGB 12.4 12/14/2014 0746   HCT 37.8 04/12/2018 1051   HCT 37.0 12/14/2014 0746   PLT  230 12/14/2014 0746   MCV 89 04/12/2018 1051   MCV 88.9 12/14/2014 0746   MCH 30.0 04/12/2018 1051   MCH 29.8 12/14/2014 0746   MCH 30.1 03/04/2011 1040   MCHC 33.9 04/12/2018 1051   MCHC 33.5 12/14/2014 0746   MCHC 33.1 03/04/2011 1040   RDW 12.4 04/12/2018 1051   RDW  12.5 12/14/2014 0746   LYMPHSABS 2.0 04/12/2018 1051   LYMPHSABS 1.8 12/14/2014 0746   MONOABS 0.5 12/14/2014 0746   EOSABS 0.2 04/12/2018 1051   BASOSABS 0.0 04/12/2018 1051   BASOSABS 0.1 12/14/2014 0746   Iron/TIBC/Ferritin/ %Sat No results found for: IRON, TIBC, FERRITIN, IRONPCTSAT Lipid Panel     Component Value Date/Time   CHOL 147 01/11/2019 1416   TRIG 80 01/11/2019 1416   HDL 58 01/11/2019 1416   LDLCALC 73 01/11/2019 1416   Hepatic Function Panel     Component Value Date/Time   PROT 7.2 01/11/2019 1416   PROT 7.1 12/14/2014 0747   ALBUMIN 4.4 01/11/2019 1416   ALBUMIN 3.8 12/14/2014 0747   AST 14 01/11/2019 1416   AST 16 12/14/2014 0747   ALT 12 01/11/2019 1416   ALT 15 12/14/2014 0747   ALKPHOS 63 01/11/2019 1416   ALKPHOS 52 12/14/2014 0747   BILITOT 0.3 01/11/2019 1416   BILITOT 0.40 12/14/2014 0747      Component Value Date/Time   TSH 1.480 04/12/2018 1051     Ref. Range 01/11/2019 14:16  Vitamin D, 25-Hydroxy Latest Ref Range: 30.0 - 100.0 ng/mL 50.3    OBESITY BEHAVIORAL INTERVENTION VISIT  Today's visit was # 25  Starting weight: 259 lbs Starting date: 04/12/2018 Today's weight : 232 lbs Today's date: 05/18/2019 Total lbs lost to date: 27    05/18/2019  Height 5' 4"  (1.626 m)  Weight 232 lb (105.2 kg)  BMI (Calculated) 39.8  BLOOD PRESSURE - SYSTOLIC 340  BLOOD PRESSURE - DIASTOLIC 85   Body Fat % 37.0 %  Total Body Water (lbs) 82.6 lbs    ASK: We discussed the diagnosis of obesity with [name] today and Kathleen [name] agreed to give [name] permission to discuss obesity behavioral modification therapy today.  ASSESS: Kathleen [name] has the diagnosis of obesity and her BMI today is 31.8 Kathleen [name] is in the action stage of change   ADVISE: Kathleen [name] was educated on the multiple health risks of obesity as well as the benefit of weight loss to improve her health. She was advised of the need for long term treatment and the importance of  lifestyle modifications to improve her current health and to decrease her risk of future health problems.  AGREE: Multiple dietary modification options and treatment options were discussed and  Kathleen [name] agreed to follow the recommendations documented in the above note.  ARRANGE: Kathleen [name] was educated on the importance of frequent visits to treat obesity as outlined per CMS and USPSTF guidelines and agreed to schedule her next follow up appointment today.  Corey Skains, am acting as transcriptionist for Abby Potash, PA-C I, Abby Potash, PA-C have reviewed above note and agree with its content

## 2019-06-07 ENCOUNTER — Ambulatory Visit (INDEPENDENT_AMBULATORY_CARE_PROVIDER_SITE_OTHER): Payer: Medicare Other | Admitting: Physician Assistant

## 2019-06-07 ENCOUNTER — Other Ambulatory Visit: Payer: Self-pay

## 2019-06-07 ENCOUNTER — Encounter (INDEPENDENT_AMBULATORY_CARE_PROVIDER_SITE_OTHER): Payer: Self-pay | Admitting: Physician Assistant

## 2019-06-07 VITALS — BP 139/85 | HR 58 | Temp 98.1°F | Ht 64.0 in | Wt 234.0 lb

## 2019-06-07 DIAGNOSIS — E559 Vitamin D deficiency, unspecified: Secondary | ICD-10-CM | POA: Diagnosis not present

## 2019-06-07 DIAGNOSIS — E119 Type 2 diabetes mellitus without complications: Secondary | ICD-10-CM

## 2019-06-07 DIAGNOSIS — Z6841 Body Mass Index (BMI) 40.0 and over, adult: Secondary | ICD-10-CM

## 2019-06-07 DIAGNOSIS — E7849 Other hyperlipidemia: Secondary | ICD-10-CM | POA: Diagnosis not present

## 2019-06-08 LAB — HEMOGLOBIN A1C
Est. average glucose Bld gHb Est-mCnc: 120 mg/dL
Hgb A1c MFr Bld: 5.8 % — ABNORMAL HIGH (ref 4.8–5.6)

## 2019-06-08 LAB — COMPREHENSIVE METABOLIC PANEL
ALT: 15 IU/L (ref 0–32)
AST: 12 IU/L (ref 0–40)
Albumin/Globulin Ratio: 1.5 (ref 1.2–2.2)
Albumin: 4.2 g/dL (ref 3.7–4.7)
Alkaline Phosphatase: 72 IU/L (ref 39–117)
BUN/Creatinine Ratio: 33 — ABNORMAL HIGH (ref 12–28)
BUN: 31 mg/dL — ABNORMAL HIGH (ref 8–27)
Bilirubin Total: 0.2 mg/dL (ref 0.0–1.2)
CO2: 25 mmol/L (ref 20–29)
Calcium: 9.7 mg/dL (ref 8.7–10.3)
Chloride: 99 mmol/L (ref 96–106)
Creatinine, Ser: 0.95 mg/dL (ref 0.57–1.00)
GFR calc Af Amer: 67 mL/min/{1.73_m2} (ref >59)
GFR calc non Af Amer: 58 mL/min/{1.73_m2} — ABNORMAL LOW (ref >59)
Globulin, Total: 2.8 g/dL (ref 1.5–4.5)
Glucose: 109 mg/dL — ABNORMAL HIGH (ref 65–99)
Potassium: 4.2 mmol/L (ref 3.5–5.2)
Sodium: 139 mmol/L (ref 134–144)
Total Protein: 7 g/dL (ref 6.0–8.5)

## 2019-06-08 LAB — LIPID PANEL WITH LDL/HDL RATIO
Cholesterol, Total: 147 mg/dL (ref 100–199)
HDL: 59 mg/dL (ref >39)
LDL Chol Calc (NIH): 74 mg/dL (ref 0–99)
LDL/HDL Ratio: 1.3 ratio (ref 0.0–3.2)
Triglycerides: 71 mg/dL (ref 0–149)
VLDL Cholesterol Cal: 14 mg/dL (ref 5–40)

## 2019-06-08 LAB — VITAMIN D 25 HYDROXY (VIT D DEFICIENCY, FRACTURES): Vit D, 25-Hydroxy: 56.3 ng/mL (ref 30.0–100.0)

## 2019-06-08 LAB — INSULIN, RANDOM: INSULIN: 19.5 u[IU]/mL (ref 2.6–24.9)

## 2019-06-08 NOTE — Progress Notes (Signed)
Office: [phone]  /  Fax: [phone]   HPI:  Chief Complaint: Kathleen [name] is here to discuss her progress with her obesity treatment plan. She is on the keep a food journal with 1400 to 1500 calories and 90 grams of protein daily plan and states she is following her eating plan approximately 90 % of the time. She states she is riding the exercise bike 20 to 25 minutes 4 to 5 times per week.  Kathleen [name] states that she pulled a hamstring moving furniture and she has not been able to exercise on her bike regularly. She is doing well following the plan.  Vitamin D deficiency [name] has a diagnosis of vitamin D deficiency. Kathleen [name] is on vitamin D and she denies nausea, vomiting or muscle weakness. Her last vitamin D level was at goal.  At risk for osteopenia and osteoporosis Kathleen [name] is at higher risk of osteopenia and osteoporosis due to Vitamin D deficiency.   Diabetes II Kathleen [name] has a diagnosis of diabetes type II. Kathleen [name] is on Metformin and she has no nausea, vomiting or diarrhea. Kathleen [name] has no polyphagia. Kathleen [name] states fasting BGs range between 115 and 138.   Hyperlipidemia Kathleen [name] has hyperlipidemia and she is on Zocor. She has no chest pain. Kathleen [name] has no myalgias, due to medication.   Today's visit was # 26 Starting weight: 259 lbs Starting date: 04/12/2018 Today's weight : 234 lbs Today's date: 06/07/2019 Total lbs lost to date: 25 Total lbs lost since last in-office visit: 0  ASSESSMENT AND PLAN:  Vitamin D deficiency - Plan: Vitamin D (25 hydroxy)  Type 2 diabetes mellitus without complication, without long-term current use of insulin ([place]) - Plan: Comprehensive Metabolic Panel (CMET), HgB A1c, Insulin, random  Other hyperlipidemia - Plan: Lipid Panel With LDL/HDL Ratio  At risk for osteoporosis  Class 3 severe obesity with serious comorbidity and body mass index (BMI) of 40.0 to 44.9 in adult, unspecified obesity type (HCC)  PLAN:  Vitamin  D Deficiency Low vitamin D level contributes to fatigue and are associated with obesity, breast, and colon cancer. Kathleen [name] will continue to take prescription Vit D _0 ,000 IU every week and we will check labs. She will follow up for routine testing of vitamin D, at least 2-3 times per year to avoid over-replacement.  At risk for osteopenia and osteoporosis Kathleen [name] was given extended (15 minutes) osteoporosis prevention counseling today. Kathleen [name] is at risk for osteopenia and osteoporosis due to her Vitamin D deficiency. She was encouraged to take her Vitamin D and follow her higher calcium diet and increase strengthening exercise to help strengthen her bones and decrease her risk of osteopenia and osteoporosis.  Diabetes II Kathleen [name] has been given diabetes education by myself today. Good blood sugar control is important to decrease the likelihood of diabetic complications such as nephropathy, neuropathy, limb loss, blindness, coronary artery disease, and death. Intensive lifestyle modification including diet, exercise and weight loss were discussed as the first line treatment for diabetes. Kathleen [name] will continue with medications and weight loss. We will check labs and Kathleen [name] will follow up at the agreed upon time.  Hyperlipidemia Intensive lifestyle modifications as the first line treatment for hyperlipidemia. We discussed many lifestyle modifications today and Kathleen [name] will continue medications and weight loss. We will check labs and Kathleen [name] will follow up as directed.  Obesity Kathleen [name] is currently in the action stage of change. As such, her goal is to continue with weight loss efforts She has agreed to keep a food journal with  1200 to 1300 calories and 85 grams of protein daily Kathleen [name] has been instructed to rest her hamstring for the next two weeks (no exercise bike).. We discussed the following Behavioral Modification Strategies today: planning for success and work on meal planning and  easy cooking plans.  Kathleen [name] has agreed to follow up with our clinic in 3 weeks. She was informed of the importance of frequent follow up visits to maximize her success with intensive lifestyle modifications for her multiple health conditions.  ALLERGIES: Allergies  Allergen Reactions  . Mobic [Meloxicam] Shortness Of Breath    "Wheezing"   . Naproxen Sodium Swelling    Hands and feet  . Tape     Surgical tape adhesive and paper tape   . Doxycycline Itching and Rash    MEDICATIONS: Current Outpatient Medications on File Prior to Visit  Medication Sig Dispense Refill  . acetaminophen (TYLENOL) 500 MG tablet Take 500 mg by mouth every 6 (six) hours as needed. 1-2 tablets q 4-6 hrs            . aspirin 81 MG tablet Take 81 mg by mouth daily.    . betamethasone dipropionate (DIPROLENE) 0.05 % cream Apply topically 1 day or 1 dose.      . bisoprolol (ZEBETA) 5 MG tablet     . Blood Glucose Monitoring Suppl (ONE TOUCH ULTRA 2) w/Device KIT Use to check blood sugar two times a day 1 kit 0  . chlorthalidone (HYGROTON) 50 MG tablet Take 1 tablet (50 mg total) by mouth daily. 30 tablet 0  . fexofenadine (ALLEGRA) 180 MG tablet Take 180 mg by mouth daily.     Marland Kitchen FIBER ADULT GUMMIES 2 g CHEW Chew 2 tablets by mouth daily.    . Glucosamine HCl 1500 MG TABS Take 2 tablets by mouth daily.    Marland Kitchen glucose blood test strip Use to check glucose two times daily 100 each 0  . ibuprofen (ADVIL,MOTRIN) 200 MG tablet Take 200 mg by mouth every 6 (six) hours as needed.      Javier Docker Oil 300 MG CAPS Take by mouth daily.      . Lancets (ONETOUCH ULTRASOFT) lancets Use to check blood glucose two times daily 100 each 0  . Magnesium 250 MG TABS Take 1 tablet by mouth daily.    . metFORMIN (GLUCOPHAGE) 500 MG tablet Take 1 tablet (500 mg total) by mouth daily with breakfast. 30 tablet 0  . omeprazole (PRILOSEC) 20 MG capsule Take 20 mg by mouth daily.      Marland Kitchen omeprazole-sodium bicarbonate (ZEGERID) 40-1100  MG per capsule Take 1 capsule by mouth daily before breakfast.      . Probiotic Product (PROBIOTIC DAILY PO) Take by mouth.    . simvastatin (ZOCOR) 40 MG tablet Take 1 tablet (40 mg total) by mouth at bedtime. 30 tablet 2  . valsartan (DIOVAN) 80 MG tablet Take 1 tablet (80 mg total) by mouth 2 (two) times daily. 180 tablet 0  . Vitamin D, Ergocalciferol, (DRISDOL) 1.25 MG (50000 UT) CAPS capsule Take 1 capsule (50,000 Units total) by mouth every 7 (seven) days. 4 capsule 0   No current facility-administered medications on file prior to visit.    PAST MEDICAL HISTORY: Past Medical History:  Diagnosis Date  . Arthritis   . Back pain   . Breast cancer ([place]) 2012  . Cancer ([place])    colon  . Diabetes mellitus   . Family history of breast cancer   .  Family history of colon cancer   . GERD (gastroesophageal reflux disease)   . Hyperlipidemia   . Hypertension   . Joint pain   . Kidney problem   . Lactose intolerance   . Neuromuscular disorder ([place])    back, hip, leg left side  . Obesity   . Personal history of radiation therapy 2012  . Sleep apnea   . Spinal stenosis     PAST SURGICAL HISTORY: Past Surgical History:  Procedure Laterality Date  . ABDOMINAL HYSTERECTOMY    . BREAST EXCISIONAL BIOPSY Left 1972  . BREAST LUMPECTOMY Right 2012  . BREAST SURGERY     biopsy  . COLON SURGERY     partial colectomy  . X-STOP IMPLANTATION  September 26th,2012    SOCIAL HISTORY: Social History   Tobacco Use  . Smoking status: Former Research scientist (life sciences)  . Smokeless tobacco: Never Used  Substance Use Topics  . Alcohol use: Yes  . Drug use: No    FAMILY HISTORY: Family History  Problem Relation Age of Onset  . Breast cancer Mother        dx late 17's  . Hyperlipidemia Mother   . Heart disease Mother   . Liver disease Mother   . Breast cancer Sister 78  . Heart disease Father   . Obesity Father   . Hypertension Father   . Diabetes Father   . Heart disease Maternal Grandmother     . Colon cancer Cousin        dx 66+  . Kidney cancer Son 56  . Breast cancer Sister     ROS: Review of Systems  Constitutional: Negative for weight loss.  Cardiovascular: Negative for chest pain.  Gastrointestinal: Negative for diarrhea, nausea and vomiting.  Musculoskeletal: Negative for myalgias.       Negative for muscle weakness  Endo/Heme/Allergies:       Negative for polyphagia    PHYSICAL EXAM: Blood pressure 139/85, pulse (!) 58, temperature 98.1 F (36.7 C), temperature source Oral, height '5\' 4"'$  (1.626 m), weight 234 lb (106.1 kg), SpO2 98 %. Body mass index is 40.17 kg/m. Physical Exam Vitals reviewed.  Constitutional:      General: She is not in acute distress.    Appearance: Normal appearance. She is well-developed. She is obese.  Cardiovascular:     Rate and Rhythm: Normal rate.  Pulmonary:     Effort: Pulmonary effort is normal.  Musculoskeletal:        General: Normal range of motion.  Skin:    General: Skin is warm and dry.  Neurological:     Mental Status: She is alert and oriented to person, place, and time.  Psychiatric:        Mood and Affect: Mood normal.        Behavior: Behavior normal.     RECENT LABS AND TESTS: BMET    Component Value Date/Time   NA 141 01/11/2019 1416   NA 141 12/14/2014 0747   K 4.5 01/11/2019 1416   K 4.2 12/14/2014 0747   CL 99 01/11/2019 1416   CO2 25 01/11/2019 1416   CO2 28 12/14/2014 0747   GLUCOSE 111 (H) 01/11/2019 1416   GLUCOSE 120 12/14/2014 0747   BUN 27 01/11/2019 1416   BUN 21.3 12/14/2014 0747   CREATININE 0.76 01/11/2019 1416   CREATININE 0.9 12/14/2014 0747   CALCIUM 9.7 01/11/2019 1416   CALCIUM 9.4 12/14/2014 0747   GFRNONAA 76 01/11/2019 1416   GFRAA 88 01/11/2019  1416   Lab Results  Component Value Date   HGBA1C 5.9 (H) 01/11/2019   HGBA1C 6.2 (H) 08/24/2018   HGBA1C 6.8 (H) 04/12/2018   Lab Results  Component Value Date   INSULIN 18.5 01/11/2019   INSULIN 24.9 08/24/2018    INSULIN 30.6 (H) 04/12/2018   CBC    Component Value Date/Time   WBC 7.1 04/12/2018 1051   WBC 8.0 12/14/2014 0746   WBC 6.1 03/04/2011 1040   RBC 4.27 04/12/2018 1051   RBC 4.17 12/14/2014 0746   RBC 3.99 03/04/2011 1040   HGB 12.8 04/12/2018 1051   HGB 12.4 12/14/2014 0746   HCT 37.8 04/12/2018 1051   HCT 37.0 12/14/2014 0746   PLT 230 12/14/2014 0746   MCV 89 04/12/2018 1051   MCV 88.9 12/14/2014 0746   MCH 30.0 04/12/2018 1051   MCH 29.8 12/14/2014 0746   MCH 30.1 03/04/2011 1040   MCHC 33.9 04/12/2018 1051   MCHC 33.5 12/14/2014 0746   MCHC 33.1 03/04/2011 1040   RDW 12.4 04/12/2018 1051   RDW 12.5 12/14/2014 0746   LYMPHSABS 2.0 04/12/2018 1051   LYMPHSABS 1.8 12/14/2014 0746   MONOABS 0.5 12/14/2014 0746   EOSABS 0.2 04/12/2018 1051   BASOSABS 0.0 04/12/2018 1051   BASOSABS 0.1 12/14/2014 0746   Iron/TIBC/Ferritin/ %Sat No results found for: IRON, TIBC, FERRITIN, IRONPCTSAT Lipid Panel     Component Value Date/Time   CHOL 147 01/11/2019 1416   TRIG 80 01/11/2019 1416   HDL 58 01/11/2019 1416   LDLCALC 73 01/11/2019 1416   Hepatic Function Panel     Component Value Date/Time   PROT 7.2 01/11/2019 1416   PROT 7.1 12/14/2014 0747   ALBUMIN 4.4 01/11/2019 1416   ALBUMIN 3.8 12/14/2014 0747   AST 14 01/11/2019 1416   AST 16 12/14/2014 0747   ALT 12 01/11/2019 1416   ALT 15 12/14/2014 0747   ALKPHOS 63 01/11/2019 1416   ALKPHOS 52 12/14/2014 0747   BILITOT 0.3 01/11/2019 1416   BILITOT 0.40 12/14/2014 0747      Component Value Date/Time   TSH 1.480 04/12/2018 1051     OBESITY BEHAVIORAL INTERVENTION VISIT DOCUMENTATION FOR INSURANCE (~15 minutes)   ASK: We discussed the diagnosis of obesity with [place] today and Kathleen [name] agreed to give [name] permission to discuss obesity behavioral modification therapy today.  ASSESS: Kathleen [name] has the diagnosis of obesity and her BMI today is 72.15 Kathleen [name] is in the action stage of change    ADVISE: Kathleen [name] was educated on the multiple health risks of obesity as well as the benefit of weight loss to improve her health. She was advised of the need for long term treatment and the importance of lifestyle modifications to improve her current health and to decrease her risk of future health problems.  AGREE: Multiple dietary modification options and treatment options were discussed and  Kathleen [name] agreed to follow the recommendations documented in the above note.  ARRANGE: Kathleen [name] was educated on the importance of frequent visits to treat obesity as outlined per CMS and USPSTF guidelines and agreed to schedule her next follow up appointment today.   Corey Skains, am acting as transcriptionist for Abby Potash, PA-C I, Abby Potash, PA-C have reviewed above note and agree with its content

## 2019-06-29 ENCOUNTER — Ambulatory Visit (INDEPENDENT_AMBULATORY_CARE_PROVIDER_SITE_OTHER): Payer: Medicare Other | Admitting: Physician Assistant

## 2019-06-29 ENCOUNTER — Other Ambulatory Visit: Payer: Self-pay

## 2019-06-29 ENCOUNTER — Encounter (INDEPENDENT_AMBULATORY_CARE_PROVIDER_SITE_OTHER): Payer: Self-pay | Admitting: Physician Assistant

## 2019-06-29 VITALS — BP 135/72 | HR 54 | Temp 98.1°F | Ht 64.0 in | Wt 233.0 lb

## 2019-06-29 DIAGNOSIS — E119 Type 2 diabetes mellitus without complications: Secondary | ICD-10-CM | POA: Diagnosis not present

## 2019-06-29 DIAGNOSIS — E559 Vitamin D deficiency, unspecified: Secondary | ICD-10-CM | POA: Diagnosis not present

## 2019-06-29 DIAGNOSIS — Z6841 Body Mass Index (BMI) 40.0 and over, adult: Secondary | ICD-10-CM

## 2019-06-29 DIAGNOSIS — E7849 Other hyperlipidemia: Secondary | ICD-10-CM

## 2019-06-29 DIAGNOSIS — I10 Essential (primary) hypertension: Secondary | ICD-10-CM | POA: Diagnosis not present

## 2019-06-29 MED ORDER — METFORMIN HCL 500 MG PO TABS: 500.0000 mg | ORAL_TABLET | Freq: Every day | ORAL | 0 refills | Status: DC

## 2019-06-29 MED ORDER — VALSARTAN 80 MG PO TABS: 80.0000 mg | ORAL_TABLET | Freq: Two times a day (BID) | ORAL | 2 refills | Status: DC

## 2019-06-30 NOTE — Progress Notes (Signed)
Chief Complaint:   Kathleen [name] is here to discuss her progress with her obesity treatment plan along with follow-up of her obesity related diagnoses. 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 Kathleen [name] states she is not exercising.   Today's visit was #:26 Starting weight: 259 lbs Starting date: 04/12/2018 Today's weight: 233 lbs Today's date: 06/29/2019 Total lbs lost to date: 26 lbs Total lbs lost since last in-office visit: 1 lb  Interim History: Kathleen [name] reports that [place] was [place] the beach for the [place] and over ate her calories. She has gotten back on track. She has not been on her exercise bike due to a strained hamstring.   Subjective:   1. Type 2 diabetes mellitus without complication, without long-term current use of insulin (HCC) Medications reviewed. Home glucose monitoring: fasting values range 104-142, further diabetic ROS: no hypoglycemia, nausea, vomiting, or diarrhea. She is taking Metformin daily.   Lab Results  Component Value Date   HGBA1C 5.8 (H) 06/07/2019   HGBA1C 5.9 (H) 01/11/2019   HGBA1C 6.2 (H) 08/24/2018   Lab Results  Component Value Date   LDLCALC 74 06/07/2019   CREATININE 0.95 06/07/2019   2. Essential hypertension Review: taking medications as instructed (valsartan and chlorthalidone), no medication side effects noted, no chest pain on exertion. Blood pressure is normal.   BP Readings from Last 3 Encounters:  06/29/19 135/72  06/07/19 139/85  05/18/19 (!) 144/85   3. Other hyperlipidemia Kathleen [name] has hyperlipidemia and has been trying to improve her cholesterol levels with intensive lifestyle modification including a low saturated fat diet, exercise and weight loss. She is on Simvastatin. Last lipid panel normal.  She denies any chest pain, claudication or myalgias.  Lab  Results  Component Value Date   ALT 15 06/07/2019   AST 12 06/07/2019   ALKPHOS 72 06/07/2019   BILITOT 0.2 06/07/2019   Lab Results  Component Value Date   CHOL 147 06/07/2019   HDL 59 06/07/2019   LDLCALC 74 06/07/2019   TRIG 71 06/07/2019    4. Vitamin D deficiency [name]'s Vitamin D level was 56.3 on 06/07/19. She is currently taking vit D. She denies nausea, vomiting or muscle weakness.  Assessment/Plan:   1. Type 2 diabetes mellitus without complication, without long-term current use of insulin ([place]) Kathleen [name] has been given diabetes education by myself today. Good blood sugar control is important to decrease the likelihood of diabetic complications such as nephropathy, neuropathy, limb loss, blindness, coronary artery disease, and death. Intensive lifestyle modification including diet, exercise and weight loss were discussed as the first line treatment for diabetes.  - metFORMIN (GLUCOPHAGE) 500 MG tablet; Take 1 tablet (500 mg total) by mouth daily with breakfast.  Dispense: 30 tablet; Refill: 0  2. Essential hypertension Kathleen [name] is working on healthy weight loss and exercise to improve blood pressure control. We will watch for signs of hypotension as she continues her lifestyle modifications. - valsartan (DIOVAN) 80 MG tablet; Take 1 tablet (80 mg total) by mouth 2 (two) times daily.  Dispense: 30 tablet; Refill: 2  3. Other hyperlipidemia Cardiovascular risk and specific lipid/LDL goals reviewed.  We discussed several lifestyle modifications today and Kathleen [name] will continue to work on diet, exercise and weight loss efforts. Orders and follow up as documented in patient record.   Counseling Intensive lifestyle modifications  are the first line treatment for this issue. . Dietary changes: Increase soluble fiber. Decrease simple carbohydrates. . Exercise changes: Moderate to vigorous-intensity aerobic activity 150 minutes per week if tolerated. . Lipid-lowering medications:  see documented in medical record.  4. Vitamin D deficiency Low Vitamin D level contributes to fatigue and are associated with obesity, breast, and colon cancer. She agrees to continue to take prescription Vitamin D @50 ,000 IU every week and will follow-up for routine testing of vitamin D, at least 2-3 times per year to avoid over-replacement.  5. Class 3 severe obesity with serious comorbidity and body mass index (BMI) of 40.0 to 44.9 in adult, unspecified obesity type [facility]) Kathleen [name] is currently in the action stage of change. As such, her goal is to continue with weight loss efforts. She has agreed to keeping a food journal and adhering to recommended goals of 1200-1300 calories and 85g of protein daily.   We discussed the following exercise goals today: Older adults should follow the adult guidelines. When older adults cannot meet the adult guidelines, they should be as physically active as their abilities and conditions will allow.  Older adults should do exercises that maintain or improve balance if they are at risk of falling.   We discussed the following behavioral modification strategies today: meal planning and cooking strategies and planning for success.  Kathleen [name] has agreed to follow-up with our clinic in 3 weeks. She was informed of the importance of frequent follow-up visits to maximize her success with intensive lifestyle modifications for her multiple health conditions.    Objective:   Blood pressure 135/72, pulse (!) 54, temperature 98.1 F (36.7 C), temperature source Oral, height 5\' 4"  (1.626 m), weight 233 lb (105.7 kg), SpO2 99 %. Body mass index is 39.99 kg/m.  General: Cooperative, alert, well developed, in no acute distress. HEENT: Conjunctivae and lids unremarkable. Neck: No thyromegaly.  Cardiovascular: Regular rhythm.  Lungs: Normal work of breathing. Extremities: No edema.  Neurologic: No focal deficits.   Lab Results  Component Value Date    CREATININE 0.95 06/07/2019   BUN 31 (H) 06/07/2019   NA 139 06/07/2019   K 4.2 06/07/2019   CL 99 06/07/2019   CO2 25 06/07/2019   Lab Results  Component Value Date   ALT 15 06/07/2019   AST 12 06/07/2019   ALKPHOS 72 06/07/2019   BILITOT 0.2 06/07/2019   Lab Results  Component Value Date   HGBA1C 5.8 (H) 06/07/2019   HGBA1C 5.9 (H) 01/11/2019   HGBA1C 6.2 (H) 08/24/2018   HGBA1C 6.8 (H) 04/12/2018   Lab Results  Component Value Date   INSULIN 19.5 06/07/2019   INSULIN 18.5 01/11/2019   INSULIN 24.9 08/24/2018   INSULIN 30.6 (H) 04/12/2018   Lab Results  Component Value Date   TSH 1.480 04/12/2018   Lab Results  Component Value Date   CHOL 147 06/07/2019   HDL 59 06/07/2019   LDLCALC 74 06/07/2019   TRIG 71 06/07/2019   Lab Results  Component Value Date   WBC 7.1 04/12/2018   HGB 12.8 04/12/2018   HCT 37.8 04/12/2018   MCV 89 04/12/2018   PLT 230 12/14/2014   No results found for: IRON, TIBC, FERRITIN  Obesity Behavioral Intervention Documentation for Insurance:   Approximately 15 minutes were spent on the discussion below.  ASK: We discussed the diagnosis of obesity with [name] today and Kathleen [name] agreed to give [name] permission to discuss obesity behavioral modification therapy today.  ASSESS: [name] has the diagnosis of obesity and her BMI today is 39.99. Kathleen [name] is in the action stage of change.   ADVISE: [name] was educated on the multiple health risks of obesity as well as the benefit of weight loss to improve her health. She was advised of the need for long term treatment and the importance of lifestyle modifications to improve her current health and to decrease her risk of future health problems.  AGREE: Multiple dietary modification options and treatment options were discussed and Kathleen [name] agreed to follow the recommendations documented in the above note.  ARRANGE: Kathleen [name] was educated on the importance of frequent visits to treat  obesity as outlined per CMS and USPSTF guidelines and agreed to schedule her next follow up appointment today.  Attestation Statements:   Reviewed by clinician on day of visit: allergies, medications, problem list, medical history, surgical history, family history, social history, and previous encounter notes.  I, Renee Ramus, am acting as Location manager for [employer], PA-C.  I have reviewed the above documentation for accuracy and completeness, and I agree with the above. Abby Potash, PA-C

## 2019-07-05 ENCOUNTER — Ambulatory Visit: Payer: Medicare Other | Attending: Internal Medicine

## 2019-07-05 DIAGNOSIS — Z23 Encounter for immunization: Secondary | ICD-10-CM | POA: Insufficient documentation

## 2019-07-05 NOTE — Progress Notes (Signed)
   Covid-19 Vaccination Clinic  Name:  Kathleen [name]    MRN: [id number] DOB: Feb 12, 1941  07/05/2019  [name] was observed post Covid-19 immunization for 15 minutes without incidence. She was provided with Vaccine Information Sheet and instruction to access the V-Safe system.   [name] was instructed to call 911 with any severe reactions post vaccine: Marland Kitchen Difficulty breathing  . Swelling of your face and throat  . A fast heartbeat  . A bad rash all over your body  . Dizziness and weakness    Immunizations Administered    Name Date Dose VIS Date Route   Pfizer COVID-19 Vaccine 07/05/2019 10:04 AM 0.3 mL 05/27/2019 Intramuscular   Manufacturer: [place]   Lot: [id number]   [place]: [id number]

## 2019-07-12 ENCOUNTER — Other Ambulatory Visit (INDEPENDENT_AMBULATORY_CARE_PROVIDER_SITE_OTHER): Payer: Self-pay | Admitting: Physician Assistant

## 2019-07-12 DIAGNOSIS — I10 Essential (primary) hypertension: Secondary | ICD-10-CM

## 2019-07-20 ENCOUNTER — Ambulatory Visit (INDEPENDENT_AMBULATORY_CARE_PROVIDER_SITE_OTHER): Payer: Medicare Other | Admitting: Physician Assistant

## 2019-07-20 ENCOUNTER — Other Ambulatory Visit: Payer: Self-pay

## 2019-07-20 VITALS — BP 133/75 | HR 56 | Temp 98.2°F | Ht 64.0 in | Wt 232.0 lb

## 2019-07-20 DIAGNOSIS — E119 Type 2 diabetes mellitus without complications: Secondary | ICD-10-CM | POA: Diagnosis not present

## 2019-07-20 DIAGNOSIS — I10 Essential (primary) hypertension: Secondary | ICD-10-CM | POA: Diagnosis not present

## 2019-07-20 DIAGNOSIS — Z6839 Body mass index (BMI) 39.0-39.9, adult: Secondary | ICD-10-CM

## 2019-07-20 MED ORDER — METFORMIN HCL 500 MG PO TABS: 500.0000 mg | ORAL_TABLET | Freq: Every day | ORAL | 0 refills | Status: DC

## 2019-07-20 NOTE — Progress Notes (Signed)
Chief Complaint:   Kathleen [name] is here to discuss her progress with her obesity treatment plan along with follow-up of her obesity related diagnoses. 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 Kathleen [name] states she is doing exercises with resistance 10 minutes 7 times per week.  Today's visit was #: 28 Starting weight: 259 lbs Starting date: 04/12/2018 Today's weight: 232 lbs Today's date: 07/20/2019 Total lbs lost to date: 27  Total lbs lost since last in-office visit: 1  Interim History: Kathleen [name] reports staying between 1200-1300 calories on most days the last few weeks. She is doing a great job getting at least 100 grams of protein in most days.  Subjective:   Type 2 diabetes mellitus without complication, without long-term current use of insulin (Kathleen [name]). Kathleen [name] is on metformin once daily. No nausea, vomiting, or diarrhea. No hypoglycemia or polyphagia. She states fasting blood sugars range between 107 and 126.  Lab Results  Component Value Date   HGBA1C 5.8 (H) 06/07/2019   HGBA1C 5.9 (H) 01/11/2019   HGBA1C 6.2 (H) 08/24/2018   Lab Results  Component Value Date   LDLCALC 74 06/07/2019   CREATININE 0.95 06/07/2019   Lab Results  Component Value Date   INSULIN 19.5 06/07/2019   INSULIN 18.5 01/11/2019   INSULIN 24.9 08/24/2018   INSULIN 30.6 (H) 04/12/2018   Essential hypertension. Blood pressure is normal today. Kathleen [name] is on bisoprolol and Valsartan. No chest pain. No headache.  BP Readings from Last 3 Encounters:  07/20/19 133/75  06/29/19 135/72  06/07/19 139/85   Lab Results  Component Value Date   CREATININE 0.95 06/07/2019   CREATININE 0.76 01/11/2019   CREATININE 0.87 08/24/2018   Assessment/Plan:   Type 2 diabetes mellitus without complication, without  long-term current use of insulin (Kathleen [name]). Good blood sugar control is important to decrease the likelihood of diabetic complications such as nephropathy, neuropathy, limb loss, blindness, coronary artery disease, and death. Intensive lifestyle modification including diet, exercise and weight loss are the first line of treatment for diabetes. Kathleen [name] was given a refill on her metFORMIN (GLUCOPHAGE) 500 MG tablet #30 with 0 refills.  Essential hypertension. Kathleen [name] is working on healthy weight loss and exercise to improve blood pressure control. We will watch for signs of hypotension as she continues her lifestyle modifications. She will continue her medications as prescribed.  Class 2 severe obesity with serious comorbidity and body mass index (BMI) of 39.0 to 39.9 in adult, unspecified obesity type (Kathleen [name]).  Kathleen [name] is currently in the action stage of change. As such, her goal is to continue with weight loss efforts. She has agreed to keeping a food journal and adhering to recommended goals of 1200-1300 calories and 85 grams of protein daily.  Exercise goals: Older adults should follow the adult guidelines. When older adults cannot meet the adult guidelines, they should be as physically active as their abilities and conditions will allow.   Behavioral modification strategies: meal planning and cooking strategies and planning for success.  Kathleen [name] has agreed to follow-up with our clinic in 3 weeks. She was informed of the importance of frequent follow-up visits to maximize her success with intensive lifestyle modifications for her multiple health conditions.   Objective:   Blood pressure 133/75, pulse (!) 56, temperature 98.2 F (36.8 C), temperature source  Oral, height 5\' 4"  (1.626 m), weight 232 lb (105.2 kg), SpO2 97 %. Body mass index is 39.82 kg/m.  General: Cooperative, alert, well developed, in no acute distress. HEENT: Conjunctivae and lids unremarkable. Cardiovascular: Regular rhythm.    Lungs: Normal work of breathing. Neurologic: No focal deficits.   Lab Results  Component Value Date   CREATININE 0.95 06/07/2019   BUN 31 (H) 06/07/2019   NA 139 06/07/2019   K 4.2 06/07/2019   CL 99 06/07/2019   CO2 25 06/07/2019   Lab Results  Component Value Date   ALT 15 06/07/2019   AST 12 06/07/2019   ALKPHOS 72 06/07/2019   BILITOT 0.2 06/07/2019   Lab Results  Component Value Date   HGBA1C 5.8 (H) 06/07/2019   HGBA1C 5.9 (H) 01/11/2019   HGBA1C 6.2 (H) 08/24/2018   HGBA1C 6.8 (H) 04/12/2018   Lab Results  Component Value Date   INSULIN 19.5 06/07/2019   INSULIN 18.5 01/11/2019   INSULIN 24.9 08/24/2018   INSULIN 30.6 (H) 04/12/2018   Lab Results  Component Value Date   TSH 1.480 04/12/2018   Lab Results  Component Value Date   CHOL 147 06/07/2019   HDL 59 06/07/2019   LDLCALC 74 06/07/2019   TRIG 71 06/07/2019   Lab Results  Component Value Date   WBC 7.1 04/12/2018   HGB 12.8 04/12/2018   HCT 37.8 04/12/2018   MCV 89 04/12/2018   PLT 230 12/14/2014   No results found for: IRON, TIBC, FERRITIN  Obesity Behavioral Intervention Documentation for Insurance:   Approximately 15 minutes were spent on the discussion below.  ASK: We discussed the diagnosis of obesity with Kathleen [name] today and Kathleen [name] agreed to give [name] permission to discuss obesity behavioral modification therapy today.  ASSESS: Kathleen [name] has the diagnosis of obesity and her BMI today is 39.9. Kathleen [name] is in the action stage of change.   ADVISE: Kathleen [name] was educated on the multiple health risks of obesity as well as the benefit of weight loss to improve her health. She was advised of the need for long term treatment and the importance of lifestyle modifications to improve her current health and to decrease her risk of future health problems.  AGREE: Multiple dietary modification options and treatment options were discussed and Kathleen [name] agreed to follow the recommendations documented  in the above note.  ARRANGE: Kathleen [name] was educated on the importance of frequent visits to treat obesity as outlined per CMS and USPSTF guidelines and agreed to schedule her next follow up appointment today.  Attestation Statements:   Reviewed by clinician on day of visit: allergies, medications, problem list, medical history, surgical history, family history, social history, and previous encounter notes.  IMichaelene Song, am acting as transcriptionist for Abby Potash, PA-C   I have reviewed the above documentation for accuracy and completeness, and I agree with the above. Abby Potash, PA-C

## 2019-07-26 ENCOUNTER — Ambulatory Visit: Payer: Medicare Other | Attending: Internal Medicine

## 2019-07-26 DIAGNOSIS — Z23 Encounter for immunization: Secondary | ICD-10-CM | POA: Insufficient documentation

## 2019-07-26 NOTE — Progress Notes (Signed)
   Covid-19 Vaccination Clinic  Name:  Kathleen [name]    MRN: [id number] DOB: 06-Sep-1940  07/26/2019  [name] was observed post Covid-19 immunization for 15 minutes without incidence. She was provided with Vaccine Information Sheet and instruction to access the V-Safe system.   [name] was instructed to call 911 with any severe reactions post vaccine: Marland Kitchen Difficulty breathing  . Swelling of your face and throat  . A fast heartbeat  . A bad rash all over your body  . Dizziness and weakness    Immunizations Administered    Name Date Dose VIS Date Route   Pfizer COVID-19 Vaccine 07/26/2019  9:23 AM 0.3 mL 05/27/2019 Intramuscular   Manufacturer: [place]   Lot: [id number]   [place]: [id number]

## 2019-08-10 ENCOUNTER — Encounter (INDEPENDENT_AMBULATORY_CARE_PROVIDER_SITE_OTHER): Payer: Self-pay | Admitting: Physician Assistant

## 2019-08-10 ENCOUNTER — Other Ambulatory Visit: Payer: Self-pay

## 2019-08-10 ENCOUNTER — Ambulatory Visit (INDEPENDENT_AMBULATORY_CARE_PROVIDER_SITE_OTHER): Payer: Medicare Other | Admitting: Physician Assistant

## 2019-08-10 VITALS — BP 152/84 | HR 55 | Temp 97.8°F | Ht 64.0 in | Wt 231.0 lb

## 2019-08-10 DIAGNOSIS — E119 Type 2 diabetes mellitus without complications: Secondary | ICD-10-CM

## 2019-08-10 DIAGNOSIS — E7849 Other hyperlipidemia: Secondary | ICD-10-CM

## 2019-08-10 DIAGNOSIS — Z6839 Body mass index (BMI) 39.0-39.9, adult: Secondary | ICD-10-CM

## 2019-08-10 MED ORDER — METFORMIN HCL 500 MG PO TABS: 500.0000 mg | ORAL_TABLET | Freq: Every day | ORAL | 0 refills | Status: DC

## 2019-08-10 NOTE — Progress Notes (Signed)
Chief Complaint:   Kathleen [name] is here to discuss her progress with her obesity treatment plan along with follow-up of her obesity related diagnoses. Kathleen [name] is on the Category 3 Plan and journaling 1200-1300 calories + 100 grams of protein and states she is following her eating plan approximately 90% of the time. Kathleen [name] states she is riding exercise bike/walking 10 minutes 3 times per week.  Today's visit was #: 47 Starting weight: 259 lbs Starting date: 04/12/2018 Today's weight: 231 lbs Today's date: 08/10/2019 Total lbs lost to date: 28 Total lbs lost since last in-office visit: 1  Interim History: Kathleen [name] reports that she has been following the plan well except this past weekend when she indulged in pizza and ice cream at a birthday party. She is back to working out more regularly.  Subjective:   Type 2 diabetes mellitus without complication, without long-term current use of insulin (Kathleen [name]). Fasting blood sugars range between 105 and 128. Kathleen [name] is on metformin once daily. No nausea, vomiting, or diarrhea. GFR 58 on 06/07/2019.  Lab Results  Component Value Date   HGBA1C 5.8 (H) 06/07/2019   HGBA1C 5.9 (H) 01/11/2019   HGBA1C 6.2 (H) 08/24/2018   Lab Results  Component Value Date   LDLCALC 74 06/07/2019   CREATININE 0.95 06/07/2019   Lab Results  Component Value Date   INSULIN 19.5 06/07/2019   INSULIN 18.5 01/11/2019   INSULIN 24.9 08/24/2018   INSULIN 30.6 (H) 04/12/2018   Other hyperlipidemia. Kathleen [name] is on simvastatin. No chest pain or myalgias.   Lab Results  Component Value Date   CHOL 147 06/07/2019   HDL 59 06/07/2019   LDLCALC 74 06/07/2019   TRIG 71 06/07/2019   Lab Results  Component Value Date   ALT 15 06/07/2019   AST 12 06/07/2019   ALKPHOS 72 06/07/2019   BILITOT 0.2 06/07/2019   The 10-year ASCVD risk score Mikey Bussing DC Jr., et al., 2013) is: 55.7%   Values used to calculate the score:     Age: 79 years     Sex:  Female     Is Non-Hispanic African American: No     Diabetic: Yes     Tobacco smoker: No     Systolic Blood Pressure: 0000000 mmHg     Is BP treated: Yes     HDL Cholesterol: 59 mg/dL     Total Cholesterol: 147 mg/dL  Assessment/Plan:   Type 2 diabetes mellitus without complication, without long-term current use of insulin (Kathleen [name]). Good blood sugar control is important to decrease the likelihood of diabetic complications such as nephropathy, neuropathy, limb loss, blindness, coronary artery disease, and death. Intensive lifestyle modification including diet, exercise and weight loss are the first line of treatment for diabetes. Kathleen [name] was given a refill on her metFORMIN (GLUCOPHAGE) 500 MG tablet #30 with 0 refills.  Other hyperlipidemia. Cardiovascular risk and specific lipid/LDL goals reviewed.  We discussed several lifestyle modifications today and Kathleen [name] will continue to work on diet, exercise and weight loss efforts. Orders and follow up as documented in patient record. Kathleen [name] will continue her medications as directed.  Counseling Intensive lifestyle modifications are the first line treatment for this issue.  Dietary changes: Increase soluble fiber. Decrease simple carbohydrates.  Exercise changes: Moderate to vigorous-intensity aerobic activity 150 minutes per week if tolerated.  Lipid-lowering medications: see documented in medical record.  Class 2 severe obesity with serious comorbidity and body mass index (BMI) of 39.0 to 39.9 in adult,  unspecified obesity type (Kathleen [name]).  Kathleen [name] is currently in the action stage of change. As such, her goal is to continue with weight loss efforts. She has agreed to keeping a food journal and adhering to recommended goals of 1200-1300 calories and 85 grams of protein daily.   Exercise goals: Older adults should follow the adult guidelines. When older adults cannot meet the adult guidelines, they should be as physically active as their abilities and  conditions will allow.   Behavioral modification strategies: meal planning and cooking strategies and keeping healthy foods in the home.  Kathleen [name] has agreed to follow-up with our clinic in 3 weeks. She was informed of the importance of frequent follow-up visits to maximize her success with intensive lifestyle modifications for her multiple health conditions.   Objective:   Blood pressure (!) 152/84, pulse (!) 55, temperature 97.8 F (36.6 C), temperature source Oral, height 5\' 4"  (1.626 m), weight 231 lb (104.8 kg), SpO2 95 %. Body mass index is 39.65 kg/m.  General: Cooperative, alert, well developed, in no acute distress. HEENT: Conjunctivae and lids unremarkable. Cardiovascular: Regular rhythm.  Lungs: Normal work of breathing. Neurologic: No focal deficits.   Lab Results  Component Value Date   CREATININE 0.95 06/07/2019   BUN 31 (H) 06/07/2019   NA 139 06/07/2019   K 4.2 06/07/2019   CL 99 06/07/2019   CO2 25 06/07/2019   Lab Results  Component Value Date   ALT 15 06/07/2019   AST 12 06/07/2019   ALKPHOS 72 06/07/2019   BILITOT 0.2 06/07/2019   Lab Results  Component Value Date   HGBA1C 5.8 (H) 06/07/2019   HGBA1C 5.9 (H) 01/11/2019   HGBA1C 6.2 (H) 08/24/2018   HGBA1C 6.8 (H) 04/12/2018   Lab Results  Component Value Date   INSULIN 19.5 06/07/2019   INSULIN 18.5 01/11/2019   INSULIN 24.9 08/24/2018   INSULIN 30.6 (H) 04/12/2018   Lab Results  Component Value Date   TSH 1.480 04/12/2018   Lab Results  Component Value Date   CHOL 147 06/07/2019   HDL 59 06/07/2019   LDLCALC 74 06/07/2019   TRIG 71 06/07/2019   Lab Results  Component Value Date   WBC 7.1 04/12/2018   HGB 12.8 04/12/2018   HCT 37.8 04/12/2018   MCV 89 04/12/2018   PLT 230 12/14/2014   No results found for: IRON, TIBC, FERRITIN  Obesity Behavioral Intervention Documentation for Insurance:   Approximately 15 minutes were spent on the discussion below.  ASK: We discussed the  diagnosis of obesity with Kathleen [name] today and Kathleen [name] agreed to give [name] permission to discuss obesity behavioral modification therapy today.  ASSESS: Kathleen [name] has the diagnosis of obesity and her BMI today is 39.7. Kathleen [name] is in the action stage of change.   ADVISE: Kathleen [name] was educated on the multiple health risks of obesity as well as the benefit of weight loss to improve her health. She was advised of the need for long term treatment and the importance of lifestyle modifications to improve her current health and to decrease her risk of future health problems.  AGREE: Multiple dietary modification options and treatment options were discussed and Kathleen [name] agreed to follow the recommendations documented in the above note.  ARRANGE: Kathleen [name] was educated on the importance of frequent visits to treat obesity as outlined per CMS and USPSTF guidelines and agreed to schedule her next follow up appointment today.  Attestation Statements:   Reviewed by clinician on day of visit: allergies, medications, problem  list, medical history, surgical history, family history, social history, and previous encounter notes.  IMichaelene Song, am acting as transcriptionist for Abby Potash, PA-C   I have reviewed the above documentation for accuracy and completeness, and I agree with the above. Abby Potash, PA-C

## 2019-08-11 ENCOUNTER — Other Ambulatory Visit (INDEPENDENT_AMBULATORY_CARE_PROVIDER_SITE_OTHER): Payer: Self-pay | Admitting: Physician Assistant

## 2019-08-11 DIAGNOSIS — E119 Type 2 diabetes mellitus without complications: Secondary | ICD-10-CM

## 2019-09-05 ENCOUNTER — Other Ambulatory Visit: Payer: Self-pay

## 2019-09-05 ENCOUNTER — Ambulatory Visit (INDEPENDENT_AMBULATORY_CARE_PROVIDER_SITE_OTHER): Payer: Medicare Other | Admitting: Physician Assistant

## 2019-09-05 ENCOUNTER — Encounter (INDEPENDENT_AMBULATORY_CARE_PROVIDER_SITE_OTHER): Payer: Self-pay | Admitting: Physician Assistant

## 2019-09-05 VITALS — BP 168/73 | HR 55 | Temp 98.0°F | Ht 64.0 in | Wt 232.0 lb

## 2019-09-05 DIAGNOSIS — Z6839 Body mass index (BMI) 39.0-39.9, adult: Secondary | ICD-10-CM

## 2019-09-05 DIAGNOSIS — E785 Hyperlipidemia, unspecified: Secondary | ICD-10-CM | POA: Diagnosis not present

## 2019-09-05 DIAGNOSIS — E119 Type 2 diabetes mellitus without complications: Secondary | ICD-10-CM | POA: Diagnosis not present

## 2019-09-05 DIAGNOSIS — E1169 Type 2 diabetes mellitus with other specified complication: Secondary | ICD-10-CM

## 2019-09-05 NOTE — Progress Notes (Signed)
Chief Complaint:   Kathleen [name] is here to discuss her progress with her obesity treatment plan along with follow-up of her obesity related diagnoses. 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 Kathleen [name] states she is walking/biking 15-20 minutes 5-7 times per week.  Today's visit was #: 30 Starting weight: 259 lbs Starting date: 04/12/2018 Today's weight: 232 lbs Today's date: 09/05/2019 Total lbs lost to date: 27 Total lbs lost since last in-office visit: 0  Interim History: Kathleen [name] reports that [place] and splurged during some meals.  Subjective:   Type 2 diabetes mellitus without complication, without long-term current use of insulin ([place]). Kathleen [name] is on metformin. Fasting blood sugars range between 110 and 140. She reports a couple of days of nausea due to not eating on plan while on vacation. No hypoglycemia.  Lab Results  Component Value Date   HGBA1C 5.8 (H) 06/07/2019   HGBA1C 5.9 (H) 01/11/2019   HGBA1C 6.2 (H) 08/24/2018   Lab Results  Component Value Date   LDLCALC 74 06/07/2019   CREATININE 0.95 06/07/2019   Lab Results  Component Value Date   INSULIN 19.5 06/07/2019   INSULIN 18.5 01/11/2019   INSULIN 24.9 08/24/2018   INSULIN 30.6 (H) 04/12/2018   Hyperlipidemia associated with type 2 diabetes mellitus ([place]). Kathleen [name] is on simvastatin. No chest pain or myalgias. Last lipid panel was at goal.  Lab Results  Component Value Date   CHOL 147 06/07/2019   HDL 59 06/07/2019   LDLCALC 74 06/07/2019   TRIG 71 06/07/2019   Lab Results  Component Value Date   ALT 15 06/07/2019   AST 12 06/07/2019   ALKPHOS 72 06/07/2019   BILITOT 0.2 06/07/2019   The 10-year ASCVD risk score Mikey Bussing DC Jr., et al., 2013) is: 63.1%   Values used to calculate the score:     Age: 79  years     Sex: Female     Is Non-Hispanic African American: No     Diabetic: Yes     Tobacco smoker: No     Systolic Blood Pressure: [id number] mmHg     Is BP treated: Yes     HDL Cholesterol: 59 mg/dL     Total Cholesterol: 147 mg/dL  Assessment/Plan:   Type 2 diabetes mellitus without complication, without long-term current use of insulin ([place]). Good blood sugar control is important to decrease the likelihood of diabetic complications such as nephropathy, neuropathy, limb loss, blindness, coronary artery disease, and death. Intensive lifestyle modification including diet, exercise and weight loss are the first line of treatment for diabetes. Kathleen [name] will continue her medication as directed.  Hyperlipidemia associated with type 2 diabetes mellitus ([place]). Cardiovascular risk and specific lipid/LDL goals reviewed.  We discussed several lifestyle modifications today and Kathleen [name] will continue to work on diet, exercise and weight loss efforts. Orders and follow up as documented in patient record. She will continue her medication as directed.  Counseling Intensive lifestyle modifications are the first line treatment for this issue. . Dietary changes: Increase soluble fiber. Decrease simple carbohydrates. . Exercise changes: Moderate to vigorous-intensity aerobic activity 150 minutes per week if tolerated. . Lipid-lowering medications: see documented in medical record.  Class 2 severe obesity with serious comorbidity and body mass index (BMI) of 39.0 to 39.9 in adult, unspecified obesity  type ([place]).  Kathleen [name] is currently in the action stage of change. As such, her goal is to continue with weight loss efforts. She has agreed to keeping a food journal and adhering to recommended goals of 1200-1300 calories and 85 grams of protein.   Exercise goals: Older adults should follow the adult guidelines. When older adults cannot meet the adult guidelines, they should be as physically active as their abilities  and conditions will allow.   Behavioral modification strategies: meal planning and cooking strategies.  Kathleen [name] has agreed to follow-up with our clinic in 3-4 weeks. She was informed of the importance of frequent follow-up visits to maximize her success with intensive lifestyle modifications for her multiple health conditions.   Objective:   Blood pressure (!) 168/73, pulse (!) 55, temperature 98 F (36.7 C), temperature source Oral, height 5\' 4"  (1.626 m), weight 232 lb (105.2 kg), SpO2 97 %. Body mass index is 39.82 kg/m.  General: Cooperative, alert, well developed, in no acute distress. HEENT: Conjunctivae and lids unremarkable. Cardiovascular: Regular rhythm.  Lungs: Normal work of breathing. Neurologic: No focal deficits.   Lab Results  Component Value Date   CREATININE 0.95 06/07/2019   BUN 31 (H) 06/07/2019   NA 139 06/07/2019   K 4.2 06/07/2019   CL 99 06/07/2019   CO2 25 06/07/2019   Lab Results  Component Value Date   ALT 15 06/07/2019   AST 12 06/07/2019   ALKPHOS 72 06/07/2019   BILITOT 0.2 06/07/2019   Lab Results  Component Value Date   HGBA1C 5.8 (H) 06/07/2019   HGBA1C 5.9 (H) 01/11/2019   HGBA1C 6.2 (H) 08/24/2018   HGBA1C 6.8 (H) 04/12/2018   Lab Results  Component Value Date   INSULIN 19.5 06/07/2019   INSULIN 18.5 01/11/2019   INSULIN 24.9 08/24/2018   INSULIN 30.6 (H) 04/12/2018   Lab Results  Component Value Date   TSH 1.480 04/12/2018   Lab Results  Component Value Date   CHOL 147 06/07/2019   HDL 59 06/07/2019   LDLCALC 74 06/07/2019   TRIG 71 06/07/2019   Lab Results  Component Value Date   WBC 7.1 04/12/2018   HGB 12.8 04/12/2018   HCT 37.8 04/12/2018   MCV 89 04/12/2018   PLT 230 12/14/2014   No results found for: IRON, TIBC, FERRITIN  Obesity Behavioral Intervention Documentation for Insurance:   Approximately 15 minutes were spent on the discussion below.  ASK: We discussed the diagnosis of obesity with Kathleen [name]  today and Kathleen [name] agreed to give [name] permission to discuss obesity behavioral modification therapy today.  ASSESS: Kathleen [name] has the diagnosis of obesity and her BMI today is 39.9. Kathleen [name] is in the action stage of change.   ADVISE: Kathleen [name] was educated on the multiple health risks of obesity as well as the benefit of weight loss to improve her health. She was advised of the need for long term treatment and the importance of lifestyle modifications to improve her current health and to decrease her risk of future health problems.  AGREE: Multiple dietary modification options and treatment options were discussed and Kathleen [name] agreed to follow the recommendations documented in the above note.  ARRANGE: Kathleen [name] was educated on the importance of frequent visits to treat obesity as outlined per CMS and USPSTF guidelines and agreed to schedule her next follow up appointment today.  Attestation Statements:   Reviewed by clinician on day of visit: allergies, medications, problem list, medical history, surgical history, family history, social history, and  previous encounter notes.  IMichaelene Song, am acting as transcriptionist for Abby Potash, PA-C   I have reviewed the above documentation for accuracy and completeness, and I agree with the above. Abby Potash, PA-C

## 2019-09-29 ENCOUNTER — Encounter (INDEPENDENT_AMBULATORY_CARE_PROVIDER_SITE_OTHER): Payer: Self-pay | Admitting: Physician Assistant

## 2019-09-29 ENCOUNTER — Other Ambulatory Visit: Payer: Self-pay

## 2019-09-29 ENCOUNTER — Ambulatory Visit (INDEPENDENT_AMBULATORY_CARE_PROVIDER_SITE_OTHER): Payer: Medicare Other | Admitting: Physician Assistant

## 2019-09-29 VITALS — BP 144/73 | HR 57 | Temp 98.3°F | Ht 64.0 in | Wt 230.0 lb

## 2019-09-29 DIAGNOSIS — E7849 Other hyperlipidemia: Secondary | ICD-10-CM

## 2019-09-29 DIAGNOSIS — Z6839 Body mass index (BMI) 39.0-39.9, adult: Secondary | ICD-10-CM

## 2019-09-29 NOTE — Progress Notes (Signed)
Chief Complaint:   Kathleen [name] is here to discuss her progress with her obesity treatment plan along with follow-up of her obesity related diagnoses. Kathleen [name] is on keeping a food journal and adhering to recommended goals of 1300-1400 calories and 80 grams of protein and states she is following her eating plan approximately 75% of the time. Kathleen [name] states she is using the exercise bike for 10-15 minutes 4 times per week.  Today's visit was #: 82 Starting weight: 259 lbs Starting date: 04/12/2018 Today's weight: 230 lbs Today's date: 09/29/2019 Total lbs lost to date: 29 lbs Total lbs lost since last in-office visit: 2 lbs  Interim History: Kathleen [name] just returned from [employer] where she did very well with weight loss.  She is missing pasta and would like to know how she can incorporate it into her plan.  Subjective:   1. Other hyperlipidemia Kathleen [name] has hyperlipidemia and has been trying to improve her cholesterol levels with intensive lifestyle modification including a low saturated fat diet, exercise and weight loss. She denies any chest pain, claudication or myalgias.  She takes simvastatin 40 mg daily.  Last level at goal.  Lab Results  Component Value Date   ALT 15 06/07/2019   AST 12 06/07/2019   ALKPHOS 72 06/07/2019   BILITOT 0.2 06/07/2019   Lab Results  Component Value Date   CHOL 147 06/07/2019   HDL 59 06/07/2019   LDLCALC 74 06/07/2019   TRIG 71 06/07/2019   Assessment/Plan:   1. Other hyperlipidemia Cardiovascular risk and specific lipid/LDL goals reviewed.  We discussed several lifestyle modifications today and Kathleen [name] will continue to work on diet, exercise and weight loss efforts. Orders and follow up as documented in patient record.   Counseling Intensive lifestyle modifications are the first line treatment for this issue. . Dietary changes: Increase soluble fiber. Decrease simple carbohydrates. . Exercise changes: Moderate to  vigorous-intensity aerobic activity 150 minutes per week if tolerated. . Lipid-lowering medications: see documented in medical record.  2. Class 2 severe obesity with serious comorbidity and body mass index (BMI) of 39.0 to 39.9 in adult, unspecified obesity type [name]) Kathleen [name] is currently in the action stage of change. As such, her goal is to continue with weight loss efforts. She has agreed to keeping a food journal and adhering to recommended goals of 1300-1400 calories and 80 grams of protein daily.   Exercise goals: As is.  Behavioral modification strategies: meal planning and cooking strategies and keeping healthy foods in the home.  Kathleen [name] has agreed to follow-up with our clinic in 4 weeks. She was informed of the importance of frequent follow-up visits to maximize her success with intensive lifestyle modifications for her multiple health conditions.   Objective:   Blood pressure (!) 144/73, pulse (!) 57, temperature 98.3 F (36.8 C), temperature source Oral, height 5\' 4"  (1.626 m), weight 230 lb (104.3 kg), SpO2 100 %. Body mass index is 39.48 kg/m.  General: Cooperative, alert, well developed, in no acute distress. HEENT: Conjunctivae and lids unremarkable. Cardiovascular: Regular rhythm.  Lungs: Normal work of breathing. Neurologic: No focal deficits.   Lab Results  Component Value Date   CREATININE 0.95 06/07/2019   BUN 31 (H) 06/07/2019   NA 139 06/07/2019   K 4.2 06/07/2019   CL 99 06/07/2019   CO2 25 06/07/2019   Lab Results  Component Value Date   ALT 15 06/07/2019   AST 12 06/07/2019   ALKPHOS 72 06/07/2019   BILITOT  0.2 06/07/2019   Lab Results  Component Value Date   HGBA1C 5.8 (H) 06/07/2019   HGBA1C 5.9 (H) 01/11/2019   HGBA1C 6.2 (H) 08/24/2018   HGBA1C 6.8 (H) 04/12/2018   Lab Results  Component Value Date   INSULIN 19.5 06/07/2019   INSULIN 18.5 01/11/2019   INSULIN 24.9 08/24/2018   INSULIN 30.6 (H) 04/12/2018   Lab Results  Component  Value Date   TSH 1.480 04/12/2018   Lab Results  Component Value Date   CHOL 147 06/07/2019   HDL 59 06/07/2019   LDLCALC 74 06/07/2019   TRIG 71 06/07/2019   Lab Results  Component Value Date   WBC 7.1 04/12/2018   HGB 12.8 04/12/2018   HCT 37.8 04/12/2018   MCV 89 04/12/2018   PLT 230 12/14/2014   Obesity Behavioral Intervention Documentation for Insurance:   Approximately 15 minutes were spent on the discussion below.  ASK: We discussed the diagnosis of obesity with Kathleen [name] today and Kathleen [name] agreed to give [name] permission to discuss obesity behavioral modification therapy today.  ASSESS: Kathleen [name] has the diagnosis of obesity and her BMI today is 39.5. Kathleen [name] is in the action stage of change.   ADVISE: Kathleen [name] was educated on the multiple health risks of obesity as well as the benefit of weight loss to improve her health. She was advised of the need for long term treatment and the importance of lifestyle modifications to improve her current health and to decrease her risk of future health problems.  AGREE: Multiple dietary modification options and treatment options were discussed and Kathleen [name] agreed to follow the recommendations documented in the above note.  ARRANGE: Kathleen [name] was educated on the importance of frequent visits to treat obesity as outlined per CMS and USPSTF guidelines and agreed to schedule her next follow up appointment today.  Attestation Statements:   Reviewed by clinician on day of visit: allergies, medications, problem list, medical history, surgical history, family history, social history, and previous encounter notes.  I, Water quality scientist, CMA, am acting as Location manager for [employer], PA-C.  I have reviewed the above documentation for accuracy and completeness, and I agree with the above. Abby Potash, PA-C

## 2019-10-24 ENCOUNTER — Encounter: Payer: Self-pay | Admitting: Physician Assistant

## 2019-10-25 ENCOUNTER — Other Ambulatory Visit: Payer: Self-pay

## 2019-10-25 ENCOUNTER — Encounter (INDEPENDENT_AMBULATORY_CARE_PROVIDER_SITE_OTHER): Payer: Self-pay | Admitting: Physician Assistant

## 2019-10-25 ENCOUNTER — Ambulatory Visit (INDEPENDENT_AMBULATORY_CARE_PROVIDER_SITE_OTHER): Payer: Medicare Other | Admitting: Physician Assistant

## 2019-10-25 VITALS — BP 139/74 | HR 56 | Temp 98.5°F | Ht 64.0 in | Wt 233.0 lb

## 2019-10-25 DIAGNOSIS — E785 Hyperlipidemia, unspecified: Secondary | ICD-10-CM

## 2019-10-25 DIAGNOSIS — Z6841 Body Mass Index (BMI) 40.0 and over, adult: Secondary | ICD-10-CM | POA: Diagnosis not present

## 2019-10-25 DIAGNOSIS — E559 Vitamin D deficiency, unspecified: Secondary | ICD-10-CM

## 2019-10-25 DIAGNOSIS — E1169 Type 2 diabetes mellitus with other specified complication: Secondary | ICD-10-CM

## 2019-10-25 NOTE — Progress Notes (Signed)
Chief Complaint:   Kathleen [name] is here to discuss her progress with her obesity treatment plan along with follow-up of her obesity related diagnoses. Kathleen [name] is on keeping a food journal and adhering to recommended goals of 1300-1400 calories and 80 grams of protein daily and states she is following her eating plan approximately 90% of the time. Kathleen [name] states she is bike riding for 10-15 minutes 7 times per week.  Today's visit was #: 59 Starting weight: 259 lbs Starting date: 04/12/2018 Today's weight: 233 lbs Today's date: 10/25/2019 Total lbs lost to date: 26 Total lbs lost since last in-office visit: 0  Interim History: Kathleen [name] reports that the last few weeks have been quite busy. She celebrated Mother's Day and a family member's birthday. She is carrying some extra water weight today.  Subjective:   1. Hyperlipidemia associated with type 2 diabetes mellitus Kathleen [name]) Kathleen [name]'s labs were recently checked by her primary care physician. She is on Zocor, and she denies chest pain or myalgias. She is on metformin and denies nausea, vomiting, or diarrhea. I discussed labs with the patient today.  2. Vitamin D deficiency Kathleen [name]'s recent Vit D level was 57.6. She is on weekly prescription Vit D, and she denies nausea, vomiting, or muscle weakness. Her labs were recently checked by her primary care physician. I discussed labs with the patient today.  Assessment/Plan:   1. Hyperlipidemia associated with type 2 diabetes mellitus (Kathleen [name]) Cardiovascular risk and specific lipid/LDL goals reviewed. We discussed several lifestyle modifications today. Kathleen [name] will continue her medications, and will continue to work on diet, exercise and weight loss efforts. Orders and follow up as documented in patient record.   Counseling Intensive lifestyle modifications are the first line treatment for this issue. . Dietary changes: Increase soluble fiber. Decrease simple carbohydrates. . Exercise  changes: Moderate to vigorous-intensity aerobic activity 150 minutes per week if tolerated. . Lipid-lowering medications: see documented in medical record.  2. Vitamin D deficiency Low Vitamin D level contributes to fatigue and are associated with obesity, breast, and colon cancer. Kathleen [name] agreed to continue taking prescription Vitamin D 50,000 IU every week and will follow-up for routine testing of Vitamin D, at least 2-3 times per year to avoid over-replacement.  3. Class 3 severe obesity with serious comorbidity and body mass index (BMI) of 40.0 to 44.9 in adult, unspecified obesity type [name]) Kathleen [name] is currently in the action stage of change. As such, her goal is to continue with weight loss efforts. She has agreed to keeping a food journal and adhering to recommended goals of 1200-1300 calories and 80 grams of protein daily.   Exercise goals: As is.  Behavioral modification strategies: planning for success and keeping a strict food journal.  Kathleen [name] has agreed to follow-up with our clinic in 3 weeks. She was informed of the importance of frequent follow-up visits to maximize her success with intensive lifestyle modifications for her multiple health conditions.   Objective:   Blood pressure 139/74, pulse (!) 56, temperature 98.5 F (36.9 C), temperature source Oral, height 5\' 4"  (1.626 m), weight 233 lb (105.7 kg), SpO2 97 %. Body mass index is 39.99 kg/m.  General: Cooperative, alert, well developed, in no acute distress. HEENT: Conjunctivae and lids unremarkable. Cardiovascular: Regular rhythm.  Lungs: Normal work of breathing. Neurologic: No focal deficits.   Lab Results  Component Value Date   CREATININE 0.95 06/07/2019   BUN 31 (H) 06/07/2019   NA 139 06/07/2019   K 4.2 06/07/2019  CL 99 06/07/2019   CO2 25 06/07/2019   Lab Results  Component Value Date   ALT 15 06/07/2019   AST 12 06/07/2019   ALKPHOS 72 06/07/2019   BILITOT 0.2 06/07/2019   Lab Results    Component Value Date   HGBA1C 5.8 (H) 06/07/2019   HGBA1C 5.9 (H) 01/11/2019   HGBA1C 6.2 (H) 08/24/2018   HGBA1C 6.8 (H) 04/12/2018   Lab Results  Component Value Date   INSULIN 19.5 06/07/2019   INSULIN 18.5 01/11/2019   INSULIN 24.9 08/24/2018   INSULIN 30.6 (H) 04/12/2018   Lab Results  Component Value Date   TSH 1.480 04/12/2018   Lab Results  Component Value Date   CHOL 147 06/07/2019   HDL 59 06/07/2019   LDLCALC 74 06/07/2019   TRIG 71 06/07/2019   Lab Results  Component Value Date   WBC 7.1 04/12/2018   HGB 12.8 04/12/2018   HCT 37.8 04/12/2018   MCV 89 04/12/2018   PLT 230 12/14/2014   No results found for: IRON, TIBC, FERRITIN  Obesity Behavioral Intervention Documentation for Insurance:   Approximately 15 minutes were spent on the discussion below.  ASK: We discussed the diagnosis of obesity with Kathleen [name] today and Kathleen [name] agreed to give [name] permission to discuss obesity behavioral modification therapy today.  ASSESS: Kathleen [name] has the diagnosis of obesity and her BMI today is 39.97. Kathleen [name] is in the action stage of change.   ADVISE: Kathleen [name] was educated on the multiple health risks of obesity as well as the benefit of weight loss to improve her health. She was advised of the need for long term treatment and the importance of lifestyle modifications to improve her current health and to decrease her risk of future health problems.  AGREE: Multiple dietary modification options and treatment options were discussed and Kathleen [name] agreed to follow the recommendations documented in the above note.  ARRANGE: Kathleen [name] was educated on the importance of frequent visits to treat obesity as outlined per CMS and USPSTF guidelines and agreed to schedule her next follow up appointment today.  Attestation Statements:   Reviewed by clinician on day of visit: allergies, medications, problem list, medical history, surgical history, family history, social history, and  previous encounter notes.   Wilhemena Durie, am acting as transcriptionist for [employer], PA-C.  I have reviewed the above documentation for accuracy and completeness, and I agree with the above. Abby Potash, PA-C

## 2019-11-08 ENCOUNTER — Other Ambulatory Visit: Payer: Self-pay | Admitting: Family Medicine

## 2019-11-08 ENCOUNTER — Other Ambulatory Visit: Payer: Self-pay | Admitting: Hematology and Oncology

## 2019-11-08 DIAGNOSIS — Z1231 Encounter for screening mammogram for malignant neoplasm of breast: Secondary | ICD-10-CM

## 2019-11-16 ENCOUNTER — Other Ambulatory Visit: Payer: Self-pay

## 2019-11-16 ENCOUNTER — Encounter (INDEPENDENT_AMBULATORY_CARE_PROVIDER_SITE_OTHER): Payer: Self-pay | Admitting: Physician Assistant

## 2019-11-16 ENCOUNTER — Ambulatory Visit (INDEPENDENT_AMBULATORY_CARE_PROVIDER_SITE_OTHER): Payer: Medicare Other | Admitting: Physician Assistant

## 2019-11-16 VITALS — BP 123/69 | HR 53 | Temp 97.7°F | Ht 64.0 in | Wt 227.0 lb

## 2019-11-16 DIAGNOSIS — E785 Hyperlipidemia, unspecified: Secondary | ICD-10-CM

## 2019-11-16 DIAGNOSIS — Z6839 Body mass index (BMI) 39.0-39.9, adult: Secondary | ICD-10-CM

## 2019-11-16 DIAGNOSIS — E7849 Other hyperlipidemia: Secondary | ICD-10-CM | POA: Diagnosis not present

## 2019-11-16 DIAGNOSIS — E1169 Type 2 diabetes mellitus with other specified complication: Secondary | ICD-10-CM | POA: Diagnosis not present

## 2019-11-16 NOTE — Progress Notes (Signed)
Chief Complaint:   [name] is here to discuss her progress with her obesity treatment plan along with follow-up of her obesity related diagnoses. 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 Kathleen [name] states she is exercising on the exercise bike 15-20 minutes 4 times per week.  Today's visit was #: 44 Starting weight: 259 lbs Starting date: 04/12/2018 Today's weight: 227 lbs Today's date: 11/16/2019 Total lbs lost to date: 32 Total lbs lost since last in-office visit: 6  Interim History: Kathleen [name] states that she has been staying between 1200-1300 calories and meeting her protein goal daily. She is traveling to the beach soon.  Subjective:   Type 2 diabetes mellitus with hyperlipidemia ([place]). Fasting blood sugars range between 112 and 125. Kathleen [name] is on metformin. No nausea, vomiting, or diarrhea. Last GFR was 54 with her PCP last month.  Lab Results  Component Value Date   HGBA1C 5.8 (H) 06/07/2019   HGBA1C 5.9 (H) 01/11/2019   HGBA1C 6.2 (H) 08/24/2018   Lab Results  Component Value Date   LDLCALC 74 06/07/2019   CREATININE 0.95 06/07/2019   Lab Results  Component Value Date   INSULIN 19.5 06/07/2019   INSULIN 18.5 01/11/2019   INSULIN 24.9 08/24/2018   INSULIN 30.6 (H) 04/12/2018   Other hyperlipidemia. Kathleen [name] is on Zocor. No chest pain or myalgias. She is exercising regularly.   Lab Results  Component Value Date   CHOL 147 06/07/2019   HDL 59 06/07/2019   LDLCALC 74 06/07/2019   TRIG 71 06/07/2019   Lab Results  Component Value Date   ALT 15 06/07/2019   AST 12 06/07/2019   ALKPHOS 72 06/07/2019   BILITOT 0.2 06/07/2019   The 10-year ASCVD risk score Mikey Bussing DC Jr., et al., 2013) is: 41.2%   Values used to calculate the score:     Age: 79 years     Sex: Female     Is Non-Hispanic African  American: No     Diabetic: Yes     Tobacco smoker: No     Systolic Blood Pressure: [id number]     Is BP treated: Yes     HDL Cholesterol: 59 mg/dL     Total Cholesterol: 147 mg/dL  Assessment/Plan:   Type 2 diabetes mellitus with hyperlipidemia ([place]). Good blood sugar control is important to decrease the likelihood of diabetic complications such as nephropathy, neuropathy, limb loss, blindness, coronary artery disease, and death. Intensive lifestyle modification including diet, exercise and weight loss are the first line of treatment for diabetes. Kathleen [name] will continue metformin as directed.  Other hyperlipidemia. Cardiovascular risk and specific lipid/LDL goals reviewed.  We discussed several lifestyle modifications today and Kathleen [name] will continue to work on diet, exercise and weight loss efforts. Orders and follow up as documented in patient record. Kathleen [name] will continue her medication as directed.   Counseling Intensive lifestyle modifications are the first line treatment for this issue. . Dietary changes: Increase soluble fiber. Decrease simple carbohydrates. . Exercise changes: Moderate to vigorous-intensity aerobic activity 150 minutes per week if tolerated. . Lipid-lowering medications: see documented in medical record.  Class 2 severe obesity with serious comorbidity and body mass index (BMI) of 39.0 to 39.9 in adult, unspecified obesity type ([place]).  Kathleen [name] is currently in the action stage of change. As such, her goal is to  continue with weight loss efforts. She has agreed to keeping a food journal and adhering to recommended goals of 1200-1300 calories and 85 grams of protein daily.   Exercise goals: Older adults should follow the adult guidelines. When older adults cannot meet the adult guidelines, they should be as physically active as their abilities and conditions will allow.   Behavioral modification strategies: meal planning and cooking strategies and keeping healthy foods  in the home.  Kathleen [name] has agreed to follow-up with our clinic in 3 weeks. She was informed of the importance of frequent follow-up visits to maximize her success with intensive lifestyle modifications for her multiple health conditions.   Objective:   Blood pressure 123/69, pulse (!) 53, temperature 97.7 F (36.5 C), temperature source Oral, height 5\' 4"  (1.626 m), weight 227 lb (103 kg), SpO2 97 %. Body mass index is 38.96 kg/m.  General: Cooperative, alert, well developed, in no acute distress. HEENT: Conjunctivae and lids unremarkable. Cardiovascular: Regular rhythm.  Lungs: Normal work of breathing. Neurologic: No focal deficits.   Lab Results  Component Value Date   CREATININE 0.95 06/07/2019   BUN 31 (H) 06/07/2019   NA 139 06/07/2019   K 4.2 06/07/2019   CL 99 06/07/2019   CO2 25 06/07/2019   Lab Results  Component Value Date   ALT 15 06/07/2019   AST 12 06/07/2019   ALKPHOS 72 06/07/2019   BILITOT 0.2 06/07/2019   Lab Results  Component Value Date   HGBA1C 5.8 (H) 06/07/2019   HGBA1C 5.9 (H) 01/11/2019   HGBA1C 6.2 (H) 08/24/2018   HGBA1C 6.8 (H) 04/12/2018   Lab Results  Component Value Date   INSULIN 19.5 06/07/2019   INSULIN 18.5 01/11/2019   INSULIN 24.9 08/24/2018   INSULIN 30.6 (H) 04/12/2018   Lab Results  Component Value Date   TSH 1.480 04/12/2018   Lab Results  Component Value Date   CHOL 147 06/07/2019   HDL 59 06/07/2019   LDLCALC 74 06/07/2019   TRIG 71 06/07/2019   Lab Results  Component Value Date   WBC 7.1 04/12/2018   HGB 12.8 04/12/2018   HCT 37.8 04/12/2018   MCV 89 04/12/2018   PLT 230 12/14/2014   No results found for: IRON, TIBC, FERRITIN  Obesity Behavioral Intervention Documentation for Insurance:   Approximately 15 minutes were spent on the discussion below.  ASK: We discussed the diagnosis of obesity with Kathleen [name] today and Kathleen [name] agreed to give [name] permission to discuss obesity behavioral modification  therapy today.  ASSESS: Kathleen [name] has the diagnosis of obesity and her BMI today is 39.1. Kathleen [name] is in the action stage of change.   ADVISE: Kathleen [name] was educated on the multiple health risks of obesity as well as the benefit of weight loss to improve her health. She was advised of the need for long term treatment and the importance of lifestyle modifications to improve her current health and to decrease her risk of future health problems.  AGREE: Multiple dietary modification options and treatment options were discussed and Kathleen [name] agreed to follow the recommendations documented in the above note.  ARRANGE: Kathleen [name] was educated on the importance of frequent visits to treat obesity as outlined per CMS and USPSTF guidelines and agreed to schedule her next follow up appointment today.  Attestation Statements:   Reviewed by clinician on day of visit: allergies, medications, problem list, medical history, surgical history, family history, social history, and previous encounter notes.  Migdalia Dk, am acting as Location manager  for Abby Potash, PA-C   I have reviewed the above documentation for accuracy and completeness, and I agree with the above. Abby Potash, PA-C

## 2019-12-08 ENCOUNTER — Other Ambulatory Visit: Payer: Self-pay

## 2019-12-08 ENCOUNTER — Encounter (INDEPENDENT_AMBULATORY_CARE_PROVIDER_SITE_OTHER): Payer: Self-pay | Admitting: Physician Assistant

## 2019-12-08 ENCOUNTER — Ambulatory Visit (INDEPENDENT_AMBULATORY_CARE_PROVIDER_SITE_OTHER): Payer: Medicare Other | Admitting: Physician Assistant

## 2019-12-08 VITALS — BP 122/67 | HR 54 | Temp 97.8°F | Ht 64.0 in

## 2019-12-08 DIAGNOSIS — E119 Type 2 diabetes mellitus without complications: Secondary | ICD-10-CM

## 2019-12-08 DIAGNOSIS — G4733 Obstructive sleep apnea (adult) (pediatric): Secondary | ICD-10-CM

## 2019-12-08 DIAGNOSIS — Z6839 Body mass index (BMI) 39.0-39.9, adult: Secondary | ICD-10-CM

## 2019-12-12 NOTE — Progress Notes (Signed)
Chief Complaint:   Kathleen [name] is here to discuss her progress with her obesity treatment plan along with follow-up of her obesity related diagnoses. 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 Kathleen [name] states she is biking 15-20 minutes 4-5 times per week.  Today's visit was #: 65 Starting weight: 259 lbs Starting date: 04/12/2018 Today's weight: 231 lbs Today's date: 12/08/2019 Total lbs lost to date: 28 Total lbs lost since last in-office visit: 0  Interim History: Kathleen [name] just returned from the beach where most days she overate her calories, averaging about 1400-1500 calories daily. She is returning to the beach this weekend.  Subjective:   OSA (obstructive sleep apnea). Kathleen [name] saw Dr. Maxwell Caul yesterday for follow-up. She reports using CPAP regularly.  Type 2 diabetes mellitus without complication, without long-term current use of insulin (Kathleen [name]). Kathleen [name] is on metformin. No nausea, vomiting, or diarrhea.   Lab Results  Component Value Date   HGBA1C 5.8 (H) 06/07/2019   HGBA1C 5.9 (H) 01/11/2019   HGBA1C 6.2 (H) 08/24/2018   Lab Results  Component Value Date   LDLCALC 74 06/07/2019   CREATININE 0.95 06/07/2019   Lab Results  Component Value Date   INSULIN 19.5 06/07/2019   INSULIN 18.5 01/11/2019   INSULIN 24.9 08/24/2018   INSULIN 30.6 (H) 04/12/2018   Assessment/Plan:   OSA (obstructive sleep apnea). Intensive lifestyle modifications are the first line treatment for this issue. We discussed several lifestyle modifications today and she will continue to work on diet, exercise and weight loss efforts. We will continue to monitor. Orders and follow up as documented in patient record. Kathleen [name] will continue using CPAP as directed.  Counseling  Sleep apnea is a condition in which breathing pauses or  becomes shallow during sleep. This happens over and over during the night. This disrupts your sleep and keeps your body from getting the rest that it needs, which can cause tiredness and lack of energy (fatigue) during the day.  Sleep apnea treatment: If you were given a device to open your airway while you sleep, USE IT!  Sleep hygiene:   Limit or avoid alcohol, caffeinated beverages, and cigarettes, especially close to bedtime.   Do not eat a large meal or eat spicy foods right before bedtime. This can lead to digestive discomfort that can make it hard for you to sleep.  Keep a sleep diary to help you and your health care provider figure out what could be causing your insomnia.  . Make your bedroom a dark, comfortable place where it is easy to fall asleep. ? Put up shades or blackout curtains to block light from outside. ? Use a white noise machine to block noise. ? Keep the temperature cool. . Limit screen use before bedtime. This includes: ? Watching TV. ? Using your smartphone, tablet, or computer. . Stick to a routine that includes going to bed and waking up at the same times every day and night. This can help you fall asleep faster. Consider making a quiet activity, such as reading, part of your nighttime routine. . Try to avoid taking naps during the day so that you sleep better at night. . Get out of bed if you are still awake after 15 minutes of trying to sleep. Keep the lights down, but try reading or doing a quiet activity. When you  feel sleepy, go back to bed.  Type 2 diabetes mellitus without complication, without long-term current use of insulin ([place]). Good blood sugar control is important to decrease the likelihood of diabetic complications such as nephropathy, neuropathy, limb loss, blindness, coronary artery disease, and death. Intensive lifestyle modification including diet, exercise and weight loss are the first line of treatment for diabetes. Kathleen [name] will continue her  medication as directed.  Class 2 severe obesity with serious comorbidity and body mass index (BMI) of 39.0 to 39.9 in adult, unspecified obesity type ([place]).  Kathleen [name] is currently in the action stage of change. As such, her goal is to continue with weight loss efforts. She has agreed to keeping a food journal and adhering to recommended goals of 1200-1300 calories and 85 grams of protein daily.   Exercise goals: Older adults should follow the adult guidelines. When older adults cannot meet the adult guidelines, they should be as physically active as their abilities and conditions will allow.   Behavioral modification strategies: meal planning and cooking strategies and keeping healthy foods in the home.  Kathleen [name] has agreed to follow-up with our clinic in 4 weeks. She was informed of the importance of frequent follow-up visits to maximize her success with intensive lifestyle modifications for her multiple health conditions.   Objective:   Blood pressure 122/67, pulse (!) 54, temperature 97.8 F (36.6 C), temperature source Oral, height 5\' 4"  (1.626 m), SpO2 99 %. Body mass index is 38.96 kg/m.  General: Cooperative, alert, well developed, in no acute distress. HEENT: Conjunctivae and lids unremarkable. Cardiovascular: Regular rhythm.  Lungs: Normal work of breathing. Neurologic: No focal deficits.   Lab Results  Component Value Date   CREATININE 0.95 06/07/2019   BUN 31 (H) 06/07/2019   NA 139 06/07/2019   K 4.2 06/07/2019   CL 99 06/07/2019   CO2 25 06/07/2019   Lab Results  Component Value Date   ALT 15 06/07/2019   AST 12 06/07/2019   ALKPHOS 72 06/07/2019   BILITOT 0.2 06/07/2019   Lab Results  Component Value Date   HGBA1C 5.8 (H) 06/07/2019   HGBA1C 5.9 (H) 01/11/2019   HGBA1C 6.2 (H) 08/24/2018   HGBA1C 6.8 (H) 04/12/2018   Lab Results  Component Value Date   INSULIN 19.5 06/07/2019   INSULIN 18.5 01/11/2019   INSULIN 24.9 08/24/2018   INSULIN 30.6 (H)  04/12/2018   Lab Results  Component Value Date   TSH 1.480 04/12/2018   Lab Results  Component Value Date   CHOL 147 06/07/2019   HDL 59 06/07/2019   LDLCALC 74 06/07/2019   TRIG 71 06/07/2019   Lab Results  Component Value Date   WBC 7.1 04/12/2018   HGB 12.8 04/12/2018   HCT 37.8 04/12/2018   MCV 89 04/12/2018   PLT 230 12/14/2014   No results found for: IRON, TIBC, FERRITIN  Obesity Behavioral Intervention Documentation for Insurance:   Approximately 15 minutes were spent on the discussion below.  ASK: We discussed the diagnosis of obesity with Kathleen [name] today and Kathleen [name] agreed to give [name] permission to discuss obesity behavioral modification therapy today.  ASSESS: Kathleen [name] has the diagnosis of obesity and her BMI today is 39.7. Kathleen [name] is in the action stage of change.   ADVISE: Kathleen [name] was educated on the multiple health risks of obesity as well as the benefit of weight loss to improve her health. She was advised of the need for long term treatment and the importance of lifestyle modifications to  improve her current health and to decrease her risk of future health problems.  AGREE: Multiple dietary modification options and treatment options were discussed and Kathleen [name] agreed to follow the recommendations documented in the above note.  ARRANGE: Kathleen [name] was educated on the importance of frequent visits to treat obesity as outlined per CMS and USPSTF guidelines and agreed to schedule her next follow up appointment today.  Attestation Statements:   Reviewed by clinician on day of visit: allergies, medications, problem list, medical history, surgical history, family history, social history, and previous encounter notes.  IMichaelene Song, am acting as transcriptionist for Abby Potash, PA-C   I have reviewed the above documentation for accuracy and completeness, and I agree with the above. Abby Potash, PA-C

## 2019-12-22 ENCOUNTER — Other Ambulatory Visit: Payer: Self-pay

## 2019-12-22 ENCOUNTER — Ambulatory Visit
Admission: RE | Admit: 2019-12-22 | Discharge: 2019-12-22 | Disposition: A | Discharge status: Home / Self Care | Payer: Medicare Other | Source: Ambulatory Visit | Attending: Family Medicine | Admitting: Family Medicine

## 2019-12-22 DIAGNOSIS — Z1231 Encounter for screening mammogram for malignant neoplasm of breast: Secondary | ICD-10-CM

## 2019-12-23 ENCOUNTER — Encounter: Payer: Medicare Other | Admitting: Adult Health

## 2020-01-05 ENCOUNTER — Ambulatory Visit (INDEPENDENT_AMBULATORY_CARE_PROVIDER_SITE_OTHER): Payer: Medicare Other | Admitting: Physician Assistant

## 2020-01-05 ENCOUNTER — Encounter (INDEPENDENT_AMBULATORY_CARE_PROVIDER_SITE_OTHER): Payer: Self-pay | Admitting: Physician Assistant

## 2020-01-05 ENCOUNTER — Other Ambulatory Visit: Payer: Self-pay

## 2020-01-05 VITALS — BP 141/79 | HR 52 | Temp 97.9°F | Ht 64.0 in | Wt 228.0 lb

## 2020-01-05 DIAGNOSIS — E785 Hyperlipidemia, unspecified: Secondary | ICD-10-CM

## 2020-01-05 DIAGNOSIS — E559 Vitamin D deficiency, unspecified: Secondary | ICD-10-CM

## 2020-01-05 DIAGNOSIS — I10 Essential (primary) hypertension: Secondary | ICD-10-CM | POA: Diagnosis not present

## 2020-01-05 DIAGNOSIS — Z6839 Body mass index (BMI) 39.0-39.9, adult: Secondary | ICD-10-CM | POA: Diagnosis not present

## 2020-01-05 DIAGNOSIS — E1169 Type 2 diabetes mellitus with other specified complication: Secondary | ICD-10-CM | POA: Diagnosis not present

## 2020-01-05 MED ORDER — VALSARTAN 80 MG PO TABS: 80.0000 mg | ORAL_TABLET | Freq: Two times a day (BID) | ORAL | 0 refills | Status: DC

## 2020-01-05 MED ORDER — SIMVASTATIN 40 MG PO TABS: 40.0000 mg | ORAL_TABLET | Freq: Every day | ORAL | 0 refills | Status: DC

## 2020-01-05 MED ORDER — CHLORTHALIDONE 50 MG PO TABS: 50.0000 mg | ORAL_TABLET | Freq: Every day | ORAL | 0 refills | Status: DC

## 2020-01-05 MED ORDER — METFORMIN HCL 500 MG PO TABS: 500.0000 mg | ORAL_TABLET | Freq: Every day | ORAL | 0 refills | Status: DC

## 2020-01-05 MED ORDER — VITAMIN D (ERGOCALCIFEROL) 1.25 MG (50000 UNIT) PO CAPS: 50000.0000 [IU] | ORAL_CAPSULE | ORAL | 0 refills | Status: DC

## 2020-01-05 MED ORDER — BISOPROLOL FUMARATE 5 MG PO TABS: 5.0000 mg | ORAL_TABLET | Freq: Every day | ORAL | 0 refills | Status: DC

## 2020-01-05 NOTE — Progress Notes (Signed)
Chief Complaint:   Kathleen [name] is here to discuss her progress with her obesity treatment plan along with follow-up of her obesity related diagnoses. 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 Kathleen [name] states she is biking 15-20 minutes 4-5 times per week.  Today's visit was #: 54 Starting weight: 259 lbs Starting date: 04/12/2018 Today's weight: 228 lbs Today's date: 01/05/2020 Total lbs lost to date: 16  Interim History: Kathleen [name] returned from a beach trip recently and has done well overall. She continues to journal and is working out regularly.  Subjective:   Essential hypertension. Kathleen [name] reports she was rushing this a.m. and this is why her blood pressure is slightly elevated today.  BP Readings from Last 3 Encounters:  01/05/20 (!) 141/79  12/08/19 122/67  11/16/19 123/69   Lab Results  Component Value Date   CREATININE 0.95 06/07/2019   CREATININE 0.76 01/11/2019   CREATININE 0.87 08/24/2018   Vitamin D deficiency. Kathleen [name] is on prescription Vitamin D supplementation. No nausea, vomiting, or muscle weakness.    Ref. Range 06/07/2019 11:28  Vitamin D, 25-Hydroxy Latest Ref Range: 30.0 - 100.0 ng/mL 56.3   Type 2 diabetes mellitus with hyperlipidemia (Kathleen [name]). Fasting blood sugars range between 110 and 130's. Kathleen [name] is on metformin and tolerating it well. No hypoglycemia.  Lab Results  Component Value Date   HGBA1C 5.8 (H) 06/07/2019   HGBA1C 5.9 (H) 01/11/2019   HGBA1C 6.2 (H) 08/24/2018   Lab Results  Component Value Date   LDLCALC 74 06/07/2019   CREATININE 0.95 06/07/2019   Lab Results  Component Value Date   INSULIN 19.5 06/07/2019   INSULIN 18.5 01/11/2019   INSULIN 24.9 08/24/2018   INSULIN 30.6 (H) 04/12/2018   Assessment/Plan:   Essential hypertension. Kathleen [name] is working on  healthy weight loss and exercise to improve blood pressure control. We will watch for signs of hypotension as she continues her lifestyle modifications. Refills were given for bisoprolol (ZEBETA) 5 MG tablet #90 with 0 refills, chlorthalidone (HYGROTON) 50 MG tablet #90 with 0 refills, and valsartan (DIOVAN) 80 MG tablet #90 with 0 refills.  Vitamin D deficiency. Low Vitamin D level contributes to fatigue and are associated with obesity, breast, and colon cancer. She was given a refill on her Vitamin D, Ergocalciferol, (DRISDOL) 1.25 MG (50000 UNIT) CAPS capsule every week #4 with 0 refills and will follow-up for routine testing of Vitamin D, at least 2-3 times per year to avoid over-replacement.   Type 2 diabetes mellitus with hyperlipidemia (Kathleen [name]). Good blood sugar control is important to decrease the likelihood of diabetic complications such as nephropathy, neuropathy, limb loss, blindness, coronary artery disease, and death. Intensive lifestyle modification including diet, exercise and weight loss are the first line of treatment for diabetes. Refills were given for metFORMIN (GLUCOPHAGE) 500 MG tablet #90 with 0 refills and simvastatin (ZOCOR) 40 MG tablet #90 with 0 refills.  Class 2 severe obesity with serious comorbidity and body mass index (BMI) of 39.0 to 39.9 in adult, unspecified obesity type (Kathleen [name]).  Kathleen [name] is currently in the action stage of change. As such, her goal is to continue with weight loss efforts. She has agreed to keeping a food journal and adhering to recommended goals of 1200-1300 calories and 85 grams of protein daily.   Exercise goals: Older adults should follow the  adult guidelines. When older adults cannot meet the adult guidelines, they should be as physically active as their abilities and conditions will allow.   Behavioral modification strategies: planning for success and keeping a strict food journal.  Kathleen [name] has agreed to follow-up with our clinic in 3 weeks. She  was informed of the importance of frequent follow-up visits to maximize her success with intensive lifestyle modifications for her multiple health conditions.   Objective:   Blood pressure (!) 141/79, pulse (!) 52, temperature 97.9 F (36.6 C), temperature source Oral, height 5\' 4"  (1.626 m), weight 228 lb (103.4 kg), SpO2 99 %. Body mass index is 39.14 kg/m.  General: Cooperative, alert, well developed, in no acute distress. HEENT: Conjunctivae and lids unremarkable. Cardiovascular: Regular rhythm.  Lungs: Normal work of breathing. Neurologic: No focal deficits.   Lab Results  Component Value Date   CREATININE 0.95 06/07/2019   BUN 31 (H) 06/07/2019   NA 139 06/07/2019   K 4.2 06/07/2019   CL 99 06/07/2019   CO2 25 06/07/2019   Lab Results  Component Value Date   ALT 15 06/07/2019   AST 12 06/07/2019   ALKPHOS 72 06/07/2019   BILITOT 0.2 06/07/2019   Lab Results  Component Value Date   HGBA1C 5.8 (H) 06/07/2019   HGBA1C 5.9 (H) 01/11/2019   HGBA1C 6.2 (H) 08/24/2018   HGBA1C 6.8 (H) 04/12/2018   Lab Results  Component Value Date   INSULIN 19.5 06/07/2019   INSULIN 18.5 01/11/2019   INSULIN 24.9 08/24/2018   INSULIN 30.6 (H) 04/12/2018   Lab Results  Component Value Date   TSH 1.480 04/12/2018   Lab Results  Component Value Date   CHOL 147 06/07/2019   HDL 59 06/07/2019   LDLCALC 74 06/07/2019   TRIG 71 06/07/2019   Lab Results  Component Value Date   WBC 7.1 04/12/2018   HGB 12.8 04/12/2018   HCT 37.8 04/12/2018   MCV 89 04/12/2018   PLT 230 12/14/2014   No results found for: IRON, TIBC, FERRITIN  Obesity Behavioral Intervention Documentation for Insurance:   Approximately 15 minutes were spent on the discussion below.  ASK: We discussed the diagnosis of obesity with Kathleen [name] today and Kathleen [name] agreed to give [name] permission to discuss obesity behavioral modification therapy today.  ASSESS: Kathleen [name] has the diagnosis of obesity and her BMI  today is 39.2. Kathleen [name] is in the action stage of change.   ADVISE: Kathleen [name] was educated on the multiple health risks of obesity as well as the benefit of weight loss to improve her health. She was advised of the need for long term treatment and the importance of lifestyle modifications to improve her current health and to decrease her risk of future health problems.  AGREE: Multiple dietary modification options and treatment options were discussed and Kathleen [name] agreed to follow the recommendations documented in the above note.  ARRANGE: Kathleen [name] was educated on the importance of frequent visits to treat obesity as outlined per CMS and USPSTF guidelines and agreed to schedule her next follow up appointment today.  Attestation Statements:   Reviewed by clinician on day of visit: allergies, medications, problem list, medical history, surgical history, family history, social history, and previous encounter notes.  IMichaelene Song, am acting as transcriptionist for Abby Potash, PA-C   I have reviewed the above documentation for accuracy and completeness, and I agree with the above. Abby Potash, PA-C

## 2020-01-26 ENCOUNTER — Encounter (INDEPENDENT_AMBULATORY_CARE_PROVIDER_SITE_OTHER): Payer: Self-pay | Admitting: Physician Assistant

## 2020-01-26 ENCOUNTER — Other Ambulatory Visit: Payer: Self-pay

## 2020-01-26 ENCOUNTER — Ambulatory Visit (INDEPENDENT_AMBULATORY_CARE_PROVIDER_SITE_OTHER): Payer: Medicare Other | Admitting: Physician Assistant

## 2020-01-26 VITALS — BP 136/80 | HR 58 | Temp 98.2°F | Ht 64.0 in | Wt 225.0 lb

## 2020-01-26 DIAGNOSIS — E785 Hyperlipidemia, unspecified: Secondary | ICD-10-CM

## 2020-01-26 DIAGNOSIS — E1169 Type 2 diabetes mellitus with other specified complication: Secondary | ICD-10-CM

## 2020-01-26 DIAGNOSIS — Z6838 Body mass index (BMI) 38.0-38.9, adult: Secondary | ICD-10-CM

## 2020-01-26 DIAGNOSIS — E559 Vitamin D deficiency, unspecified: Secondary | ICD-10-CM | POA: Diagnosis not present

## 2020-01-30 NOTE — Progress Notes (Addendum)
Chief Complaint:   [place] is here to discuss her progress with her obesity treatment plan along with follow-up of her obesity related diagnoses. 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 Kathleen [name] states she is biking 15-20 minutes 4 times per week.  Today's visit was #: 23 Starting weight: 259 lbs Starting date: 04/12/2018 Today's weight: 225 lbs Today's date: 01/26/2020 Total lbs lost to date: 34 Total lbs lost since last in-office visit: 3  Interim History: Kathleen [name] states that she is drinking less wine as she is trying to keep her calories around 1200. She is doing a great job exceeding her protein goal.  Subjective:   Type 2 diabetes mellitus with hyperlipidemia ([place]). Kathleen [name] is on metformin, which she is tolerating well.   Lab Results  Component Value Date   HGBA1C 5.8 (H) 06/07/2019   HGBA1C 5.9 (H) 01/11/2019   HGBA1C 6.2 (H) 08/24/2018   Lab Results  Component Value Date   LDLCALC 74 06/07/2019   CREATININE 0.95 06/07/2019   Lab Results  Component Value Date   INSULIN 19.5 06/07/2019   INSULIN 18.5 01/11/2019   INSULIN 24.9 08/24/2018   INSULIN 30.6 (H) 04/12/2018   Vitamin D deficiency. Kathleen [name] is on Vitamin D supplementation weekly, which she is tolerating well.   Ref. Range 06/07/2019 11:28  Vitamin D, 25-Hydroxy Latest Ref Range: 30.0 - 100.0 ng/mL 56.3   Assessment/Plan:   Type 2 diabetes mellitus with hyperlipidemia ([place]). Good blood sugar control is important to decrease the likelihood of diabetic complications such as nephropathy, neuropathy, limb loss, blindness, coronary artery disease, and death. Intensive lifestyle modification including diet, exercise and weight loss are the first line of treatment for diabetes. Continue with medications.   Vitamin D deficiency. Low Vitamin D  level contributes to fatigue and are associated with obesity, breast, and colon cancer. She agrees to continue to take Vitamin D as directed and will follow-up for routine testing of Vitamin D, at least 2-3 times per year to avoid over-replacement.  Class 2 severe obesity with serious comorbidity and body mass index (BMI) of 38.0 to 38.9 in adult, unspecified obesity type ([place]).  Kathleen [name] is currently in the action stage of change. As such, her goal is to continue with weight loss efforts. She has agreed to keeping a food journal and adhering to recommended goals of 1200-1300 calories and 85 grams of protein daily.   She will have labs drawn at her next office visit.  Exercise goals: Older adults should follow the adult guidelines. When older adults cannot meet the adult guidelines, they should be as physically active as their abilities and conditions will allow.   Behavioral modification strategies: planning for success and keeping a strict food journal.  Kathleen [name] has agreed to follow-up with our clinic in 2-3 weeks. She was informed of the importance of frequent follow-up visits to maximize her success with intensive lifestyle modifications for her multiple health conditions.   Objective:   Blood pressure 136/80, pulse (!) 58, temperature 98.2 F (36.8 C), temperature source Oral, height 5\' 4"  (1.626 m), weight 225 lb (102.1 kg), SpO2 95 %. Body mass index is 38.62 kg/m.  General: Cooperative, alert, well developed, in no acute distress. HEENT: Conjunctivae and lids unremarkable. Cardiovascular: Regular rhythm.  Lungs: Normal work of breathing. Neurologic: No focal deficits.   Lab  Results  Component Value Date   CREATININE 0.95 06/07/2019   BUN 31 (H) 06/07/2019   NA 139 06/07/2019   K 4.2 06/07/2019   CL 99 06/07/2019   CO2 25 06/07/2019   Lab Results  Component Value Date   ALT 15 06/07/2019   AST 12 06/07/2019   ALKPHOS 72 06/07/2019   BILITOT 0.2 06/07/2019   Lab  Results  Component Value Date   HGBA1C 5.8 (H) 06/07/2019   HGBA1C 5.9 (H) 01/11/2019   HGBA1C 6.2 (H) 08/24/2018   HGBA1C 6.8 (H) 04/12/2018   Lab Results  Component Value Date   INSULIN 19.5 06/07/2019   INSULIN 18.5 01/11/2019   INSULIN 24.9 08/24/2018   INSULIN 30.6 (H) 04/12/2018   Lab Results  Component Value Date   TSH 1.480 04/12/2018   Lab Results  Component Value Date   CHOL 147 06/07/2019   HDL 59 06/07/2019   LDLCALC 74 06/07/2019   TRIG 71 06/07/2019   Lab Results  Component Value Date   WBC 7.1 04/12/2018   HGB 12.8 04/12/2018   HCT 37.8 04/12/2018   MCV 89 04/12/2018   PLT 230 12/14/2014   No results found for: IRON, TIBC, FERRITIN  Obesity Behavioral Intervention Documentation for Insurance:   Approximately 15 minutes were spent on the discussion below.  ASK: We discussed the diagnosis of obesity with Kathleen [name] today and Kathleen [name] agreed to give [name] permission to discuss obesity behavioral modification therapy today.  ASSESS: Kathleen [name] has the diagnosis of obesity and her BMI today is 38.7. Kathleen [name] is in the action stage of change.   ADVISE: Kathleen [name] was educated on the multiple health risks of obesity as well as the benefit of weight loss to improve her health. She was advised of the need for long term treatment and the importance of lifestyle modifications to improve her current health and to decrease her risk of future health problems.  AGREE: Multiple dietary modification options and treatment options were discussed and Kathleen [name] agreed to follow the recommendations documented in the above note.  ARRANGE: Kathleen [name] was educated on the importance of frequent visits to treat obesity as outlined per CMS and USPSTF guidelines and agreed to schedule her next follow up appointment today.  Attestation Statements:   Reviewed by clinician on day of visit: allergies, medications, problem list, medical history, surgical history, family history, social  history, and previous encounter notes.  IMichaelene Song, am acting as transcriptionist for Abby Potash, PA-C   I have reviewed the above documentation for accuracy and completeness, and I agree with the above. Abby Potash, PA-C

## 2020-02-09 ENCOUNTER — Encounter (INDEPENDENT_AMBULATORY_CARE_PROVIDER_SITE_OTHER): Payer: Self-pay | Admitting: Physician Assistant

## 2020-02-09 ENCOUNTER — Other Ambulatory Visit (INDEPENDENT_AMBULATORY_CARE_PROVIDER_SITE_OTHER): Payer: Self-pay | Admitting: Physician Assistant

## 2020-02-09 ENCOUNTER — Other Ambulatory Visit: Payer: Self-pay

## 2020-02-09 ENCOUNTER — Ambulatory Visit (INDEPENDENT_AMBULATORY_CARE_PROVIDER_SITE_OTHER): Payer: Medicare Other | Admitting: Physician Assistant

## 2020-02-09 VITALS — BP 131/73 | HR 53 | Temp 97.9°F | Ht 64.0 in | Wt 226.0 lb

## 2020-02-09 DIAGNOSIS — I10 Essential (primary) hypertension: Secondary | ICD-10-CM

## 2020-02-09 DIAGNOSIS — E1169 Type 2 diabetes mellitus with other specified complication: Secondary | ICD-10-CM | POA: Diagnosis not present

## 2020-02-09 DIAGNOSIS — E785 Hyperlipidemia, unspecified: Secondary | ICD-10-CM

## 2020-02-09 DIAGNOSIS — E559 Vitamin D deficiency, unspecified: Secondary | ICD-10-CM

## 2020-02-09 DIAGNOSIS — E7849 Other hyperlipidemia: Secondary | ICD-10-CM

## 2020-02-09 DIAGNOSIS — Z6838 Body mass index (BMI) 38.0-38.9, adult: Secondary | ICD-10-CM

## 2020-02-09 NOTE — Progress Notes (Signed)
Chief Complaint:   Kathleen [name] is here to discuss her progress with her obesity treatment plan along with follow-up of her obesity related diagnoses. 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 Kathleen [name] states she is biking 15-20 minutes 4-5 times per week.  Today's visit was #: 47 Starting weight: 259 lbs Starting date: 04/12/2018 Today's weight: 226 lbs Today's date: 02/09/2020 Total lbs lost to date: 33 Total lbs lost since last in-office visit: 0  Interim History: Kathleen [name] kept her grandbaby last week, which made it harder to eat on plan. They ate out quite often during the week.  Subjective:   Other hyperlipidemia. Kathleen [name] is on simvastatin. No myalgias. She is due for labs.  Lab Results  Component Value Date   CHOL 147 06/07/2019   HDL 59 06/07/2019   LDLCALC 74 06/07/2019   TRIG 71 06/07/2019   Lab Results  Component Value Date   ALT 15 06/07/2019   AST 12 06/07/2019   ALKPHOS 72 06/07/2019   BILITOT 0.2 06/07/2019   The 10-year ASCVD risk score Mikey Bussing DC Jr., et al., 2013) is: 45.3%   Values used to calculate the score:     Age: 79 years     Sex: Female     Is Non-Hispanic African American: No     Diabetic: Yes     Tobacco smoker: No     Systolic Blood Pressure: 025 mmHg     Is BP treated: Yes     HDL Cholesterol: 59 mg/dL     Total Cholesterol: 147 mg/dL  Type 2 diabetes mellitus with hyperlipidemia ([place]). Fasting blood sugars are in the range of 97 to 127. Last A1c was 5.8 with her PCP in May 2021. Kathleen [name] is on metformin and denies hypoglycemia.  Lab Results  Component Value Date   HGBA1C 5.8 (H) 06/07/2019   HGBA1C 5.9 (H) 01/11/2019   HGBA1C 6.2 (H) 08/24/2018   Lab Results  Component Value Date   LDLCALC 74 06/07/2019   CREATININE 0.95 06/07/2019   Lab Results  Component Value Date    INSULIN 19.5 06/07/2019   INSULIN 18.5 01/11/2019   INSULIN 24.9 08/24/2018   INSULIN 30.6 (H) 04/12/2018   Vitamin D deficiency. Kathleen [name] is on Vitamin D supplementation. No nausea, vomiting, or muscle weakness.    Ref. Range 06/07/2019 11:28  Vitamin D, 25-Hydroxy Latest Ref Range: 30.0 - 100.0 ng/mL 56.3   Essential hypertension. Kathleen [name] is on chlorthalidone, bisoprolol, and Diovan. Blood pressure is controlled. No chest pain. She is due for labs.  BP Readings from Last 3 Encounters:  02/09/20 131/73  01/26/20 136/80  01/05/20 (!) 141/79   Lab Results  Component Value Date   CREATININE 0.95 06/07/2019   CREATININE 0.76 01/11/2019   CREATININE 0.87 08/24/2018   Assessment/Plan:   Other hyperlipidemia. Cardiovascular risk and specific lipid/LDL goals reviewed.  We discussed several lifestyle modifications today and Kathleen [name] will continue to work on diet, exercise and weight loss efforts. Orders and follow up as documented in patient record. She will continue her medication as directed. Labs will be checked today.  Counseling Intensive lifestyle modifications are the first line treatment for this issue. . Dietary changes: Increase soluble fiber. Decrease simple carbohydrates. . Exercise changes: Moderate to vigorous-intensity aerobic activity 150 minutes per week if tolerated. . Lipid-lowering medications: see documented in  medical record.    Type 2 diabetes mellitus with hyperlipidemia ([place]). Good blood sugar control is important to decrease the likelihood of diabetic complications such as nephropathy, neuropathy, limb loss, blindness, coronary artery disease, and death. Intensive lifestyle modification including diet, exercise and weight loss are the first line of treatment for diabetes. Kathleen [name] will continue metformin as directed.   Vitamin D deficiency. Low Vitamin D level contributes to fatigue and are associated with obesity, breast, and colon cancer. She agrees to continue  to take Vitamin D as directed and VITAMIN D 25 Hydroxy (Vit-D Deficiency, Fractures) level will be checked today.  Essential hypertension. Kathleen [name] is working on healthy weight loss and exercise to improve blood pressure control. We will watch for signs of hypotension as she continues her lifestyle modifications. She will continue her medications as directed.   Class 2 severe obesity with serious comorbidity and body mass index (BMI) of 38.0 to 38.9 in adult, unspecified obesity type ([place]).  Kathleen [name] is currently in the action stage of change. As such, her goal is to continue with weight loss efforts. She has agreed to keeping a food journal and adhering to recommended goals of 1200 calories and 85 grams of protein daily.   Exercise goals: Older adults should follow the adult guidelines. When older adults cannot meet the adult guidelines, they should be as physically active as their abilities and conditions will allow.   Behavioral modification strategies: meal planning and cooking strategies and keeping a strict food journal.  [name] has agreed to follow-up with our clinic in 3 weeks. She was informed of the importance of frequent follow-up visits to maximize her success with intensive lifestyle modifications for her multiple health conditions.   Kathleen [name] was informed we would discuss her lab results at her next visit unless there is a critical issue that needs to be addressed sooner. Kathleen [name] agreed to keep her next visit at the agreed upon time to discuss these results.  Objective:   Blood pressure 131/73, pulse (!) 53, temperature 97.9 F (36.6 C), temperature source Oral, height 5\' 4"  (1.626 m), weight 226 lb (102.5 kg), SpO2 98 %. Body mass index is 38.79 kg/m.  General: Cooperative, alert, well developed, in no acute distress. HEENT: Conjunctivae and lids unremarkable. Cardiovascular: Regular rhythm.  Lungs: Normal work of breathing. Neurologic: No focal deficits.   Lab Results    Component Value Date   CREATININE 0.95 06/07/2019   BUN 31 (H) 06/07/2019   NA 139 06/07/2019   K 4.2 06/07/2019   CL 99 06/07/2019   CO2 25 06/07/2019   Lab Results  Component Value Date   ALT 15 06/07/2019   AST 12 06/07/2019   ALKPHOS 72 06/07/2019   BILITOT 0.2 06/07/2019   Lab Results  Component Value Date   HGBA1C 5.8 (H) 06/07/2019   HGBA1C 5.9 (H) 01/11/2019   HGBA1C 6.2 (H) 08/24/2018   HGBA1C 6.8 (H) 04/12/2018   Lab Results  Component Value Date   INSULIN 19.5 06/07/2019   INSULIN 18.5 01/11/2019   INSULIN 24.9 08/24/2018   INSULIN 30.6 (H) 04/12/2018   Lab Results  Component Value Date   TSH 1.480 04/12/2018   Lab Results  Component Value Date   CHOL 147 06/07/2019   HDL 59 06/07/2019   LDLCALC 74 06/07/2019   TRIG 71 06/07/2019   Lab Results  Component Value Date   WBC 7.1 04/12/2018   HGB 12.8 04/12/2018   HCT 37.8 04/12/2018   MCV 89 04/12/2018  PLT 230 12/14/2014   No results found for: IRON, TIBC, FERRITIN  Obesity Behavioral Intervention Documentation for Insurance:   Approximately 15 minutes were spent on the discussion below.  ASK: We discussed the diagnosis of obesity with Kathleen [name] today and Kathleen [name] agreed to give [name] permission to discuss obesity behavioral modification therapy today.  ASSESS: [name] has the diagnosis of obesity and her BMI today is 38.8. Kathleen [name] is in the action stage of change.   ADVISE: [name] was educated on the multiple health risks of obesity as well as the benefit of weight loss to improve her health. She was advised of the need for long term treatment and the importance of lifestyle modifications to improve her current health and to decrease her risk of future health problems.  AGREE: Multiple dietary modification options and treatment options were discussed and Kathleen [name] agreed to follow the recommendations documented in the above note.  ARRANGE: Kathleen [name] was educated on the importance of  frequent visits to treat obesity as outlined per CMS and USPSTF guidelines and agreed to schedule her next follow up appointment today.  Attestation Statements:   Reviewed by clinician on day of visit: allergies, medications, problem list, medical history, surgical history, family history, social history, and previous encounter notes.  IMichaelene Song, am acting as transcriptionist for Abby Potash, PA-C   I have reviewed the above documentation for accuracy and completeness, and I agree with the above. Abby Potash, PA-C

## 2020-02-10 ENCOUNTER — Other Ambulatory Visit (INDEPENDENT_AMBULATORY_CARE_PROVIDER_SITE_OTHER): Payer: Self-pay | Admitting: Physician Assistant

## 2020-02-10 DIAGNOSIS — E559 Vitamin D deficiency, unspecified: Secondary | ICD-10-CM

## 2020-02-10 LAB — COMPREHENSIVE METABOLIC PANEL
ALT: 13 IU/L (ref 0–32)
AST: 14 IU/L (ref 0–40)
Albumin/Globulin Ratio: 1.5 (ref 1.2–2.2)
Albumin: 4.4 g/dL (ref 3.7–4.7)
Alkaline Phosphatase: 67 IU/L (ref 48–121)
BUN/Creatinine Ratio: 39 — ABNORMAL HIGH (ref 12–28)
BUN: 30 mg/dL — ABNORMAL HIGH (ref 8–27)
Bilirubin Total: 0.3 mg/dL (ref 0.0–1.2)
CO2: 27 mmol/L (ref 20–29)
Calcium: 9.4 mg/dL (ref 8.7–10.3)
Chloride: 97 mmol/L (ref 96–106)
Creatinine, Ser: 0.77 mg/dL (ref 0.57–1.00)
GFR calc Af Amer: 86 mL/min/{1.73_m2} (ref >59)
GFR calc non Af Amer: 75 mL/min/{1.73_m2} (ref >59)
Globulin, Total: 3 g/dL (ref 1.5–4.5)
Glucose: 103 mg/dL — ABNORMAL HIGH (ref 65–99)
Potassium: 3.9 mmol/L (ref 3.5–5.2)
Sodium: 137 mmol/L (ref 134–144)
Total Protein: 7.4 g/dL (ref 6.0–8.5)

## 2020-02-10 LAB — LIPID PANEL
Chol/HDL Ratio: 2.4 ratio (ref 0.0–4.4)
Cholesterol, Total: 146 mg/dL (ref 100–199)
HDL: 62 mg/dL (ref >39)
LDL Chol Calc (NIH): 70 mg/dL (ref 0–99)
Triglycerides: 74 mg/dL (ref 0–149)
VLDL Cholesterol Cal: 14 mg/dL (ref 5–40)

## 2020-02-10 LAB — INSULIN, RANDOM: INSULIN: 17.6 u[IU]/mL (ref 2.6–24.9)

## 2020-02-10 LAB — HEMOGLOBIN A1C
Est. average glucose Bld gHb Est-mCnc: 123 mg/dL
Hgb A1c MFr Bld: 5.9 % — ABNORMAL HIGH (ref 4.8–5.6)

## 2020-02-10 LAB — VITAMIN D 25 HYDROXY (VIT D DEFICIENCY, FRACTURES): Vit D, 25-Hydroxy: 45 ng/mL (ref 30.0–100.0)

## 2020-02-14 ENCOUNTER — Encounter (INDEPENDENT_AMBULATORY_CARE_PROVIDER_SITE_OTHER): Payer: Self-pay

## 2020-02-14 NOTE — Telephone Encounter (Signed)
My chart message sent to pt.

## 2020-03-01 ENCOUNTER — Other Ambulatory Visit: Payer: Self-pay

## 2020-03-01 ENCOUNTER — Ambulatory Visit (INDEPENDENT_AMBULATORY_CARE_PROVIDER_SITE_OTHER): Payer: Medicare Other | Admitting: Physician Assistant

## 2020-03-01 VITALS — BP 148/61 | HR 58 | Temp 97.9°F | Ht 64.0 in | Wt 225.0 lb

## 2020-03-01 DIAGNOSIS — E559 Vitamin D deficiency, unspecified: Secondary | ICD-10-CM

## 2020-03-01 DIAGNOSIS — E1169 Type 2 diabetes mellitus with other specified complication: Secondary | ICD-10-CM | POA: Diagnosis not present

## 2020-03-01 DIAGNOSIS — E785 Hyperlipidemia, unspecified: Secondary | ICD-10-CM | POA: Diagnosis not present

## 2020-03-01 DIAGNOSIS — Z6838 Body mass index (BMI) 38.0-38.9, adult: Secondary | ICD-10-CM | POA: Diagnosis not present

## 2020-03-01 MED ORDER — VITAMIN D (ERGOCALCIFEROL) 1.25 MG (50000 UNIT) PO CAPS: 50000.0000 [IU] | ORAL_CAPSULE | ORAL | 0 refills | Status: DC

## 2020-03-04 NOTE — Progress Notes (Signed)
Chief Complaint:   [place] is here to discuss her progress with her obesity treatment plan along with follow-up of her obesity related diagnoses. Kathleen [name] is on keeping a food journal and adhering to recommended goals of 1200 calories and 85 grams of protein daily and states she is following her eating plan approximately 80% of the time. Kathleen [name] states she is bike riding for 20 minutes 4-5 times per week.  Today's visit was #: 83 Starting weight: 259 lbs Starting date: 04/12/2018 Today's weight: 225 lbs Today's date: 03/01/2020 Total lbs lost to date: 34 Total lbs lost since last in-office visit: 1  Interim History: Kathleen [name] reports that she has done well for the most part but she overate her calories on celebratory occasions. She journals consistently. She denies excessive hunger but reports intermittent cravings.  Subjective:   1. Type 2 diabetes mellitus with hyperlipidemia [facility]) Kathleen [name] is on metformin. Her last A1c was 5.9, and well controlled. I discussed labs with the patient today.  2. Vitamin D deficiency Kathleen [name] is on Vit D weekly. Her Vit D level is worsening, and last level was not at goal. I discussed labs with the patient today.  Assessment/Plan:   1. Type 2 diabetes mellitus with hyperlipidemia ([facility]) Good blood sugar control is important to decrease the likelihood of diabetic complications such as nephropathy, neuropathy, limb loss, blindness, coronary artery disease, and death. Intensive lifestyle modification including diet, exercise and weight loss are the first line of treatment for diabetes. Kathleen [name] will continue her medications and weight loss.  2. Vitamin D deficiency Low Vitamin D level contributes to fatigue and are associated with obesity, breast, and colon cancer. We will refill prescription Vitamin D with a 90 day supply with no refill, and Kathleen [name] will add OTC Vit D 1,000 IU daily. She will follow-up for routine testing of Vitamin D, at  least 2-3 times per year to avoid over-replacement.  - Vitamin D, Ergocalciferol, (DRISDOL) 1.25 MG (50000 UNIT) CAPS capsule; Take 1 capsule (50,000 Units total) by mouth every 7 (seven) days.  Dispense: 12 capsule; Refill: 0  3. Class 2 severe obesity with serious comorbidity and body mass index (BMI) of 38.0 to 38.9 in adult, unspecified obesity type [facility]) Kathleen [name] is currently in the action stage of change. As such, her goal is to continue with weight loss efforts. She has agreed to keeping a food journal and adhering to recommended goals of 1200-1300 calories and 90 grams of protein daily.   Exercise goals: As is.  Behavioral modification strategies: avoiding temptations and keeping a strict food journal.  Kathleen [name] has agreed to follow-up with our clinic in 3 weeks. She was informed of the importance of frequent follow-up visits to maximize her success with intensive lifestyle modifications for her multiple health conditions.   Objective:   Blood pressure (!) 148/61, pulse (!) 58, temperature 97.9 F (36.6 C), height 5\' 4"  (1.626 m), weight 225 lb (102.1 kg), SpO2 99 %. Body mass index is 38.62 kg/m.  General: Cooperative, alert, well developed, in no acute distress. HEENT: Conjunctivae and lids unremarkable. Cardiovascular: Regular rhythm.  Lungs: Normal work of breathing. Neurologic: No focal deficits.   Lab Results  Component Value Date   CREATININE 0.77 02/09/2020   BUN 30 (H) 02/09/2020   NA 137 02/09/2020   K 3.9 02/09/2020   CL 97 02/09/2020   CO2 27 02/09/2020   Lab Results  Component Value Date   ALT 13 02/09/2020   AST 14 02/09/2020  ALKPHOS 67 02/09/2020   BILITOT 0.3 02/09/2020   Lab Results  Component Value Date   HGBA1C 5.9 (H) 02/09/2020   HGBA1C 5.8 (H) 06/07/2019   HGBA1C 5.9 (H) 01/11/2019   HGBA1C 6.2 (H) 08/24/2018   HGBA1C 6.8 (H) 04/12/2018   Lab Results  Component Value Date   INSULIN 17.6 02/09/2020   INSULIN 19.5 06/07/2019    INSULIN 18.5 01/11/2019   INSULIN 24.9 08/24/2018   INSULIN 30.6 (H) 04/12/2018   Lab Results  Component Value Date   TSH 1.480 04/12/2018   Lab Results  Component Value Date   CHOL 146 02/09/2020   HDL 62 02/09/2020   LDLCALC 70 02/09/2020   TRIG 74 02/09/2020   CHOLHDL 2.4 02/09/2020   Lab Results  Component Value Date   WBC 7.1 04/12/2018   HGB 12.8 04/12/2018   HCT 37.8 04/12/2018   MCV 89 04/12/2018   PLT 230 12/14/2014   No results found for: IRON, TIBC, FERRITIN  Obesity Behavioral Intervention:   Approximately 15 minutes were spent on the discussion below.  ASK: We discussed the diagnosis of obesity with Kathleen [name] today and Kathleen [name] agreed to give [name] permission to discuss obesity behavioral modification therapy today.  ASSESS: Kathleen [name] has the diagnosis of obesity and her BMI today is 38.6. Kathleen [name] is in the action stage of change.   ADVISE: Kathleen [name] was educated on the multiple health risks of obesity as well as the benefit of weight loss to improve her health. She was advised of the need for long term treatment and the importance of lifestyle modifications to improve her current health and to decrease her risk of future health problems.  AGREE: Multiple dietary modification options and treatment options were discussed and Kathleen [name] agreed to follow the recommendations documented in the above note.  ARRANGE: Kathleen [name] was educated on the importance of frequent visits to treat obesity as outlined per CMS and USPSTF guidelines and agreed to schedule her next follow up appointment today.  Attestation Statements:   Reviewed by clinician on day of visit: allergies, medications, problem list, medical history, surgical history, family history, social history, and previous encounter notes.   Wilhemena Durie, am acting as transcriptionist for [employer], PA-C.  I have reviewed the above documentation for accuracy and completeness, and I agree with the above. Abby Potash, PA-C

## 2020-03-20 ENCOUNTER — Telehealth (INDEPENDENT_AMBULATORY_CARE_PROVIDER_SITE_OTHER): Payer: Self-pay | Admitting: Physician Assistant

## 2020-03-20 NOTE — Telephone Encounter (Signed)
Kathleen [name] states the prescription forValsartan 80mg  tablets on 01/05/20 was written for 90 tablets with instructions of take one pill twice a day.  Kathleen [name] ask for a call concerning this prescription for correcting

## 2020-03-22 ENCOUNTER — Other Ambulatory Visit: Payer: Self-pay

## 2020-03-22 ENCOUNTER — Ambulatory Visit (INDEPENDENT_AMBULATORY_CARE_PROVIDER_SITE_OTHER): Payer: Medicare Other | Admitting: Physician Assistant

## 2020-03-22 ENCOUNTER — Encounter (INDEPENDENT_AMBULATORY_CARE_PROVIDER_SITE_OTHER): Payer: Self-pay | Admitting: Physician Assistant

## 2020-03-22 VITALS — BP 146/71 | HR 58 | Temp 98.0°F | Ht 64.0 in | Wt 225.0 lb

## 2020-03-22 DIAGNOSIS — I10 Essential (primary) hypertension: Secondary | ICD-10-CM

## 2020-03-22 DIAGNOSIS — E785 Hyperlipidemia, unspecified: Secondary | ICD-10-CM | POA: Diagnosis not present

## 2020-03-22 DIAGNOSIS — Z6838 Body mass index (BMI) 38.0-38.9, adult: Secondary | ICD-10-CM | POA: Diagnosis not present

## 2020-03-22 DIAGNOSIS — E1169 Type 2 diabetes mellitus with other specified complication: Secondary | ICD-10-CM | POA: Diagnosis not present

## 2020-03-22 MED ORDER — VALSARTAN 80 MG PO TABS: 80.0000 mg | ORAL_TABLET | Freq: Two times a day (BID) | ORAL | 0 refills | Status: DC

## 2020-03-22 NOTE — Progress Notes (Signed)
Chief Complaint:   Kathleen [name] is here to discuss her progress with her obesity treatment plan along with follow-up of her obesity related diagnoses. 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 Kathleen [name] states she is doing cardio 20 minutes 4-5 times per week.  Today's visit was #: 90 Starting weight: 259 lbs Starting date: 04/12/2018 Today's weight: 225 lbs Today's date: 03/22/2020 Total lbs lost to date: 34 Total lbs lost since last in-office visit: 0  Interim History: Kathleen [name] states that she traveled the last few weeks and "had trouble" making the right choices on a few occasions. She indulged in some alcohol, but tried to make good choices most of the time. Hunger is controlled.  Subjective:   Essential hypertension. Kathleen [name] is on valsartan, chlorthalidone, and bisoprolol. Blood pressure is slightly elevated today.  BP Readings from Last 3 Encounters:  03/22/20 (!) 146/71  03/01/20 (!) 148/61  02/09/20 131/73   Lab Results  Component Value Date   CREATININE 0.77 02/09/2020   CREATININE 0.95 06/07/2019   CREATININE 0.76 01/11/2019   Hyperlipidemia associated with type 2 diabetes mellitus ([place]). Kathleen [name] is on metformin. A1c is well controlled. Fasting blood sugars are in the range of 116 and 127.   Lab Results  Component Value Date   CHOL 146 02/09/2020   HDL 62 02/09/2020   LDLCALC 70 02/09/2020   TRIG 74 02/09/2020   CHOLHDL 2.4 02/09/2020   Lab Results  Component Value Date   ALT 13 02/09/2020   AST 14 02/09/2020   ALKPHOS 67 02/09/2020   BILITOT 0.3 02/09/2020   The 10-year ASCVD risk score Kathleen [name] DC Jr., et al., 2013) is: 53%   Values used to calculate the score:     Age: 79 years     Sex: Female     Is Non-Hispanic African American: No     Diabetic: Yes     Tobacco smoker: No     Systolic Blood  Pressure: 146 mmHg     Is BP treated: Yes     HDL Cholesterol: 62 mg/dL     Total Cholesterol: 146 mg/dL  Assessment/Plan:   Essential hypertension. Kathleen [name] is working on healthy weight loss and exercise to improve blood pressure control. We will watch for signs of hypotension as she continues her lifestyle modifications. Refill was given for valsartan (DIOVAN) 80 MG tablet #180 with 0 refills.  Hyperlipidemia associated with type 2 diabetes mellitus ([place]). Cardiovascular risk and specific lipid/LDL goals reviewed.  We discussed several lifestyle modifications today and Kathleen [name] will continue to work on diet, exercise and weight loss efforts. Orders and follow up as documented in patient record. She will continue her medications as directed.   Counseling Intensive lifestyle modifications are the first line treatment for this issue. . Dietary changes: Increase soluble fiber. Decrease simple carbohydrates. . Exercise changes: Moderate to vigorous-intensity aerobic activity 150 minutes per week if tolerated. . Lipid-lowering medications: see documented in medical record.  Class 2 severe obesity with serious comorbidity and body mass index (BMI) of 38.0 to 38.9 in adult, unspecified obesity type ([place]).  Kathleen [name] is currently in the action stage of change. As such, her goal is to continue with weight loss efforts. She has agreed to keeping a food journal and adhering to recommended goals of 1200 calories and 85 grams of protein daily.  IC will be performed at her next office visit.  Exercise goals: Older adults should follow the adult guidelines. When older adults cannot meet the adult guidelines, they should be as physically active as their abilities and conditions will allow.   Behavioral modification strategies: meal planning and cooking strategies and keeping healthy foods in the home.  Kathleen [name] has agreed to follow-up with our clinic in 3 weeks. She was informed of the importance of  frequent follow-up visits to maximize her success with intensive lifestyle modifications for her multiple health conditions.   Objective:   Blood pressure (!) 146/71, pulse (!) 58, temperature 98 F (36.7 C), height 5\' 4"  (1.626 m), weight 225 lb (102.1 kg), SpO2 96 %. Body mass index is 38.62 kg/m.  General: Cooperative, alert, well developed, in no acute distress. HEENT: Conjunctivae and lids unremarkable. Cardiovascular: Regular rhythm.  Lungs: Normal work of breathing. Neurologic: No focal deficits.   Lab Results  Component Value Date   CREATININE 0.77 02/09/2020   BUN 30 (H) 02/09/2020   NA 137 02/09/2020   K 3.9 02/09/2020   CL 97 02/09/2020   CO2 27 02/09/2020   Lab Results  Component Value Date   ALT 13 02/09/2020   AST 14 02/09/2020   ALKPHOS 67 02/09/2020   BILITOT 0.3 02/09/2020   Lab Results  Component Value Date   HGBA1C 5.9 (H) 02/09/2020   HGBA1C 5.8 (H) 06/07/2019   HGBA1C 5.9 (H) 01/11/2019   HGBA1C 6.2 (H) 08/24/2018   HGBA1C 6.8 (H) 04/12/2018   Lab Results  Component Value Date   INSULIN 17.6 02/09/2020   INSULIN 19.5 06/07/2019   INSULIN 18.5 01/11/2019   INSULIN 24.9 08/24/2018   INSULIN 30.6 (H) 04/12/2018   Lab Results  Component Value Date   TSH 1.480 04/12/2018   Lab Results  Component Value Date   CHOL 146 02/09/2020   HDL 62 02/09/2020   LDLCALC 70 02/09/2020   TRIG 74 02/09/2020   CHOLHDL 2.4 02/09/2020   Lab Results  Component Value Date   WBC 7.1 04/12/2018   HGB 12.8 04/12/2018   HCT 37.8 04/12/2018   MCV 89 04/12/2018   PLT 230 12/14/2014   No results found for: IRON, TIBC, FERRITIN  Obesity Behavioral Intervention:   Approximately 15 minutes were spent on the discussion below.  ASK: We discussed the diagnosis of obesity with Kathleen [name] today and Kathleen [name] agreed to give [name] permission to discuss obesity behavioral modification therapy today.  ASSESS: Kathleen [name] has the diagnosis of obesity and her BMI today is  38.7. Kathleen [name] is in the action stage of change.   ADVISE: Kathleen [name] was educated on the multiple health risks of obesity as well as the benefit of weight loss to improve her health. She was advised of the need for long term treatment and the importance of lifestyle modifications to improve her current health and to decrease her risk of future health problems.  AGREE: Multiple dietary modification options and treatment options were discussed and Kathleen [name] agreed to follow the recommendations documented in the above note.  ARRANGE: Kathleen [name] was educated on the importance of frequent visits to treat obesity as outlined per CMS and USPSTF guidelines and agreed to schedule her next follow up appointment today.  Attestation Statements:   Reviewed by clinician on day of visit: allergies, medications, problem list, medical history, surgical history, family history, social history, and previous encounter notes.  Migdalia Dk, am acting as Location manager for Abby Potash, PA-C   I have  reviewed the above documentation for accuracy and completeness, and I agree with the above. Abby Potash, PA-C

## 2020-04-16 ENCOUNTER — Ambulatory Visit (INDEPENDENT_AMBULATORY_CARE_PROVIDER_SITE_OTHER): Payer: Medicare Other | Admitting: Physician Assistant

## 2020-04-16 ENCOUNTER — Encounter (INDEPENDENT_AMBULATORY_CARE_PROVIDER_SITE_OTHER): Payer: Self-pay | Admitting: Physician Assistant

## 2020-04-16 ENCOUNTER — Other Ambulatory Visit: Payer: Self-pay

## 2020-04-16 VITALS — BP 121/69 | HR 57 | Temp 97.3°F | Ht 64.0 in | Wt 225.0 lb

## 2020-04-16 DIAGNOSIS — E669 Obesity, unspecified: Secondary | ICD-10-CM | POA: Diagnosis not present

## 2020-04-16 DIAGNOSIS — E785 Hyperlipidemia, unspecified: Secondary | ICD-10-CM

## 2020-04-16 DIAGNOSIS — E1169 Type 2 diabetes mellitus with other specified complication: Secondary | ICD-10-CM

## 2020-04-16 DIAGNOSIS — R0602 Shortness of breath: Secondary | ICD-10-CM | POA: Diagnosis not present

## 2020-04-16 DIAGNOSIS — E559 Vitamin D deficiency, unspecified: Secondary | ICD-10-CM | POA: Diagnosis not present

## 2020-04-16 DIAGNOSIS — Z6834 Body mass index (BMI) 34.0-34.9, adult: Secondary | ICD-10-CM

## 2020-04-16 MED ORDER — VITAMIN D (ERGOCALCIFEROL) 1.25 MG (50000 UNIT) PO CAPS: 50000.0000 [IU] | ORAL_CAPSULE | ORAL | 0 refills | Status: DC

## 2020-04-16 MED ORDER — METFORMIN HCL 500 MG PO TABS: 500.0000 mg | ORAL_TABLET | Freq: Every day | ORAL | 0 refills | Status: DC

## 2020-04-16 MED ORDER — GLUCOSE BLOOD VI STRP: ORAL_STRIP | 0 refills | Status: DC

## 2020-04-16 NOTE — Progress Notes (Signed)
Chief Complaint:   [place] is here to discuss her progress with her obesity treatment plan along with follow-up of her obesity related diagnoses. 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 Kathleen [name] states she is biking 20 minutes 4 times per week.  Today's visit was #: 37 Starting weight: 259 lbs Starting date: 04/12/2018 Today's weight: 225 lbs Today's date: 04/16/2020 Total lbs lost to date: 34 lbs Total lbs lost since last in-office visit: 0  Interim History: Kathleen [name] has been averaging 1300 calories daily. She states her energy is good and she is feeling strong. She is traveling to the beach in a few weeks.  Subjective:   Type 2 diabetes mellitus with hyperlipidemia ([place]). Kathleen [name] is on metformin. No nausea, vomiting, or diarrhea. No hypoglycemia or polyphagia.   Lab Results  Component Value Date   HGBA1C 5.9 (H) 02/09/2020   HGBA1C 5.8 (H) 06/07/2019   HGBA1C 5.9 (H) 01/11/2019   Lab Results  Component Value Date   LDLCALC 70 02/09/2020   CREATININE 0.77 02/09/2020   Lab Results  Component Value Date   INSULIN 17.6 02/09/2020   INSULIN 19.5 06/07/2019   INSULIN 18.5 01/11/2019   INSULIN 24.9 08/24/2018   INSULIN 30.6 (H) 04/12/2018   Vitamin D deficiency. Kathleen [name] is on prescription Vitamin D weekly, which she is tolerating well.   Ref. Range 02/09/2020 11:22  Vitamin D, 25-Hydroxy Latest Ref Range: 30.0 - 100.0 ng/mL 45.0   SOB (shortness of breath). Kathleen [name] reports shortness of breath with exertion. No dizziness or lightheadedness.  Assessment/Plan:   Type 2 diabetes mellitus with hyperlipidemia ([place]). Good blood sugar control is important to decrease the likelihood of diabetic complications such as nephropathy, neuropathy, limb loss, blindness, coronary artery disease, and death. Intensive  lifestyle modification including diet, exercise and weight loss are the first line of treatment for diabetes. Refills were given for metFORMIN (GLUCOPHAGE) 500 MG tablet #90 with 0 refills and glucose blood test strips #100 with 0 refills.  Vitamin D deficiency. Low Vitamin D level contributes to fatigue and are associated with obesity, breast, and colon cancer. She was given a refill on her Vitamin D, Ergocalciferol, (DRISDOL) 1.25 MG (50000 UNIT) CAPS capsule every week #12 with 0 refills and will follow-up for routine testing of Vitamin D, at least 2-3 times per year to avoid over-replacement.   SOB (shortness of breath). Kathleen [name]'s shortness of breath appears to be obesity related and exercise induced. She has agreed to work on weight loss and gradually increase exercise to treat her exercise induced shortness of breath. Will continue to monitor closely. IC was checked today showing an RMR of 1385.  Class 1 obesity with serious comorbidity and body mass index (BMI) of 34.0 to 34.9 in adult, unspecified obesity type.  Kathleen [name] is currently in the action stage of change. As such, her goal is to continue with weight loss efforts. She has agreed to keeping a food journal and adhering to recommended goals of 1100 calories and 80 grams of protein.   Exercise goals: Older adults should follow the adult guidelines. When older adults cannot meet the adult guidelines, they should be as physically active as their abilities and conditions will allow.   Behavioral modification strategies: meal planning and cooking strategies and keeping healthy foods in the home.  Kathleen [name] has agreed to follow-up  with our clinic in 4 weeks. She was informed of the importance of frequent follow-up visits to maximize her success with intensive lifestyle modifications for her multiple health conditions.   Objective:   Blood pressure 121/69, pulse (!) 57, temperature (!) 97.3 F (36.3 C), height 5\' 4"  (1.626 m), weight 225 lb  (102.1 kg), SpO2 97 %. Body mass index is 38.62 kg/m.  General: Cooperative, alert, well developed, in no acute distress. HEENT: Conjunctivae and lids unremarkable. Cardiovascular: Regular rhythm.  Lungs: Normal work of breathing. Neurologic: No focal deficits.   Lab Results  Component Value Date   CREATININE 0.77 02/09/2020   BUN 30 (H) 02/09/2020   NA 137 02/09/2020   K 3.9 02/09/2020   CL 97 02/09/2020   CO2 27 02/09/2020   Lab Results  Component Value Date   ALT 13 02/09/2020   AST 14 02/09/2020   ALKPHOS 67 02/09/2020   BILITOT 0.3 02/09/2020   Lab Results  Component Value Date   HGBA1C 5.9 (H) 02/09/2020   HGBA1C 5.8 (H) 06/07/2019   HGBA1C 5.9 (H) 01/11/2019   HGBA1C 6.2 (H) 08/24/2018   HGBA1C 6.8 (H) 04/12/2018   Lab Results  Component Value Date   INSULIN 17.6 02/09/2020   INSULIN 19.5 06/07/2019   INSULIN 18.5 01/11/2019   INSULIN 24.9 08/24/2018   INSULIN 30.6 (H) 04/12/2018   Lab Results  Component Value Date   TSH 1.480 04/12/2018   Lab Results  Component Value Date   CHOL 146 02/09/2020   HDL 62 02/09/2020   LDLCALC 70 02/09/2020   TRIG 74 02/09/2020   CHOLHDL 2.4 02/09/2020   Lab Results  Component Value Date   WBC 7.1 04/12/2018   HGB 12.8 04/12/2018   HCT 37.8 04/12/2018   MCV 89 04/12/2018   PLT 230 12/14/2014   No results found for: IRON, TIBC, FERRITIN  Obesity Behavioral Intervention:   Approximately 15 minutes were spent on the discussion below.  ASK: We discussed the diagnosis of obesity with Kathleen [name] today and Kathleen [name] agreed to give [name] permission to discuss obesity behavioral modification therapy today.  ASSESS: Kathleen [name] has the diagnosis of obesity and her BMI today is 34.3. Kathleen [name] is in the action stage of change.   ADVISE: Kathleen [name] was educated on the multiple health risks of obesity as well as the benefit of weight loss to improve her health. She was advised of the need for long term treatment and the  importance of lifestyle modifications to improve her current health and to decrease her risk of future health problems.  AGREE: Multiple dietary modification options and treatment options were discussed and Kathleen [name] agreed to follow the recommendations documented in the above note.  ARRANGE: Kathleen [name] was educated on the importance of frequent visits to treat obesity as outlined per CMS and USPSTF guidelines and agreed to schedule her next follow up appointment today.  Attestation Statements:   Reviewed by clinician on day of visit: allergies, medications, problem list, medical history, surgical history, family history, social history, and previous encounter notes.  IMichaelene Song, am acting as transcriptionist for Abby Potash, PA-C   I have reviewed the above documentation for accuracy and completeness, and I agree with the above. Abby Potash, PA-C

## 2020-04-29 ENCOUNTER — Other Ambulatory Visit (INDEPENDENT_AMBULATORY_CARE_PROVIDER_SITE_OTHER): Payer: Self-pay | Admitting: Physician Assistant

## 2020-04-29 DIAGNOSIS — I10 Essential (primary) hypertension: Secondary | ICD-10-CM

## 2020-04-29 DIAGNOSIS — E1169 Type 2 diabetes mellitus with other specified complication: Secondary | ICD-10-CM

## 2020-04-30 NOTE — Telephone Encounter (Signed)
Last seen by Tracey 

## 2020-05-01 ENCOUNTER — Encounter (INDEPENDENT_AMBULATORY_CARE_PROVIDER_SITE_OTHER): Payer: Self-pay

## 2020-05-02 ENCOUNTER — Encounter (INDEPENDENT_AMBULATORY_CARE_PROVIDER_SITE_OTHER): Payer: Self-pay

## 2020-05-02 NOTE — Telephone Encounter (Signed)
Message sent to pt-CAS 

## 2020-05-02 NOTE — Telephone Encounter (Signed)
I ordered 90 day supply of both metformin and vitamin d on 11/1 for her so she should be fine. Thanks

## 2020-05-02 NOTE — Telephone Encounter (Signed)
Please advise about refills-[facility]

## 2020-05-14 ENCOUNTER — Ambulatory Visit (INDEPENDENT_AMBULATORY_CARE_PROVIDER_SITE_OTHER): Payer: Medicare Other | Admitting: Physician Assistant

## 2020-05-15 ENCOUNTER — Other Ambulatory Visit: Payer: Self-pay

## 2020-05-15 ENCOUNTER — Ambulatory Visit (INDEPENDENT_AMBULATORY_CARE_PROVIDER_SITE_OTHER): Payer: Medicare Other | Admitting: Physician Assistant

## 2020-05-15 ENCOUNTER — Encounter (INDEPENDENT_AMBULATORY_CARE_PROVIDER_SITE_OTHER): Payer: Self-pay | Admitting: Physician Assistant

## 2020-05-15 VITALS — BP 120/67 | HR 60 | Temp 98.5°F | Ht 64.0 in | Wt 225.0 lb

## 2020-05-15 DIAGNOSIS — E119 Type 2 diabetes mellitus without complications: Secondary | ICD-10-CM | POA: Diagnosis not present

## 2020-05-15 DIAGNOSIS — E559 Vitamin D deficiency, unspecified: Secondary | ICD-10-CM

## 2020-05-15 DIAGNOSIS — Z794 Long term (current) use of insulin: Secondary | ICD-10-CM

## 2020-05-15 DIAGNOSIS — Z6838 Body mass index (BMI) 38.0-38.9, adult: Secondary | ICD-10-CM | POA: Diagnosis not present

## 2020-05-16 NOTE — Progress Notes (Signed)
Chief Complaint:   [name] is here to discuss her progress with her obesity treatment plan along with follow-up of her obesity related diagnoses. 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 Kathleen [name] states she is riding her exercise bike 15 minutes 4 times per week.  Today's visit was #: 47 Starting weight: 259 lbs Starting date: 04/12/2018 Today's weight: 225 lbs Today's date: 05/15/2020 Total lbs lost to date: 34 Total lbs lost since last in-office visit: 0  Interim History: Kathleen [name] states she has had multiple celebrations over the last few weeks. She hurt her knee recently and hasn't been on her exercise bike as much.  Subjective:   Vitamin D deficiency. Kathleen [name] is on weekly and daily Vitamin D. Last level was not at goal.   Ref. Range 02/09/2020 11:22  Vitamin D, 25-Hydroxy Latest Ref Range: 30.0 - 100.0 ng/mL 45.0   Type 2 diabetes mellitus without complication, with long-term current use of insulin ([place]). Last A1c 5.9. Kathleen [name] is on metformin. No nausea, vomiting, or diarrhea. No hypoglycemia.   Lab Results  Component Value Date   HGBA1C 5.9 (H) 02/09/2020   HGBA1C 5.8 (H) 06/07/2019   HGBA1C 5.9 (H) 01/11/2019   Lab Results  Component Value Date   LDLCALC 70 02/09/2020   CREATININE 0.77 02/09/2020   Lab Results  Component Value Date   INSULIN 17.6 02/09/2020   INSULIN 19.5 06/07/2019   INSULIN 18.5 01/11/2019   INSULIN 24.9 08/24/2018   INSULIN 30.6 (H) 04/12/2018   Assessment/Plan:   Vitamin D deficiency. Low Vitamin D level contributes to fatigue and are associated with obesity, breast, and colon cancer. She agrees to continue to take Vitamin D as directed and will follow-up for routine testing of Vitamin D, at least 2-3 times per year to avoid over-replacement.  Type 2 diabetes mellitus without  complication, with long-term current use of insulin ([place]). Good blood sugar control is important to decrease the likelihood of diabetic complications such as nephropathy, neuropathy, limb loss, blindness, coronary artery disease, and death. Intensive lifestyle modification including diet, exercise and weight loss are the first line of treatment for diabetes. Kathleen [name] will continue her medication as directed.   Class 2 severe obesity with serious comorbidity and body mass index (BMI) of 38.0 to 38.9 in adult, unspecified obesity type ([place]).  Kathleen [name] is currently in the action stage of change. As such, her goal is to continue with weight loss efforts. She has agreed to keeping a food journal and adhering to recommended goals of 1100-1200 calories and 80 grams of protein daily.   Exercise goals: Older adults should follow the adult guidelines. When older adults cannot meet the adult guidelines, they should be as physically active as their abilities and conditions will allow.   Behavioral modification strategies: keeping healthy foods in the home and keeping a strict food journal.  Kathleen [name] has agreed to follow-up with our clinic in 3 weeks. She was informed of the importance of frequent follow-up visits to maximize her success with intensive lifestyle modifications for her multiple health conditions.   Objective:   Blood pressure 120/67, pulse 60, temperature 98.5 F (36.9 C), height 5\' 4"  (1.626 m), weight 225 lb (102.1 kg), SpO2 99 %. Body mass index is 38.62 kg/m.  General: Cooperative, alert, well developed, in no acute distress. HEENT: Conjunctivae and lids  unremarkable. Cardiovascular: Regular rhythm.  Lungs: Normal work of breathing. Neurologic: No focal deficits.   Lab Results  Component Value Date   CREATININE 0.77 02/09/2020   BUN 30 (H) 02/09/2020   NA 137 02/09/2020   K 3.9 02/09/2020   CL 97 02/09/2020   CO2 27 02/09/2020   Lab Results  Component Value Date   ALT 13  02/09/2020   AST 14 02/09/2020   ALKPHOS 67 02/09/2020   BILITOT 0.3 02/09/2020   Lab Results  Component Value Date   HGBA1C 5.9 (H) 02/09/2020   HGBA1C 5.8 (H) 06/07/2019   HGBA1C 5.9 (H) 01/11/2019   HGBA1C 6.2 (H) 08/24/2018   HGBA1C 6.8 (H) 04/12/2018   Lab Results  Component Value Date   INSULIN 17.6 02/09/2020   INSULIN 19.5 06/07/2019   INSULIN 18.5 01/11/2019   INSULIN 24.9 08/24/2018   INSULIN 30.6 (H) 04/12/2018   Lab Results  Component Value Date   TSH 1.480 04/12/2018   Lab Results  Component Value Date   CHOL 146 02/09/2020   HDL 62 02/09/2020   LDLCALC 70 02/09/2020   TRIG 74 02/09/2020   CHOLHDL 2.4 02/09/2020   Lab Results  Component Value Date   WBC 7.1 04/12/2018   HGB 12.8 04/12/2018   HCT 37.8 04/12/2018   MCV 89 04/12/2018   PLT 230 12/14/2014   No results found for: IRON, TIBC, FERRITIN  Obesity Behavioral Intervention:   Approximately 15 minutes were spent on the discussion below.  ASK: We discussed the diagnosis of obesity with Kathleen [name] today and Kathleen [name] agreed to give [name] permission to discuss obesity behavioral modification therapy today.  ASSESS: Kathleen [name] has the diagnosis of obesity and her BMI today is 38.7. Kathleen [name] is in the action stage of change.   ADVISE: Kathleen [name] was educated on the multiple health risks of obesity as well as the benefit of weight loss to improve her health. She was advised of the need for long term treatment and the importance of lifestyle modifications to improve her current health and to decrease her risk of future health problems.  AGREE: Multiple dietary modification options and treatment options were discussed and Kathleen [name] agreed to follow the recommendations documented in the above note.  ARRANGE: Kathleen [name] was educated on the importance of frequent visits to treat obesity as outlined per CMS and USPSTF guidelines and agreed to schedule her next follow up appointment today.  Attestation Statements:    Reviewed by clinician on day of visit: allergies, medications, problem list, medical history, surgical history, family history, social history, and previous encounter notes.  IMichaelene Song, am acting as transcriptionist for Abby Potash, PA-C   I have reviewed the above documentation for accuracy and completeness, and I agree with the above. Abby Potash, PA-C

## 2020-06-05 ENCOUNTER — Other Ambulatory Visit: Payer: Self-pay

## 2020-06-05 ENCOUNTER — Ambulatory Visit (INDEPENDENT_AMBULATORY_CARE_PROVIDER_SITE_OTHER): Payer: Medicare Other | Admitting: Physician Assistant

## 2020-06-05 ENCOUNTER — Encounter (INDEPENDENT_AMBULATORY_CARE_PROVIDER_SITE_OTHER): Payer: Self-pay | Admitting: Physician Assistant

## 2020-06-05 VITALS — BP 122/72 | HR 58 | Temp 98.0°F | Ht 64.0 in | Wt 225.0 lb

## 2020-06-05 DIAGNOSIS — E119 Type 2 diabetes mellitus without complications: Secondary | ICD-10-CM | POA: Diagnosis not present

## 2020-06-05 DIAGNOSIS — I10 Essential (primary) hypertension: Secondary | ICD-10-CM

## 2020-06-05 DIAGNOSIS — Z6838 Body mass index (BMI) 38.0-38.9, adult: Secondary | ICD-10-CM | POA: Diagnosis not present

## 2020-06-05 DIAGNOSIS — Z794 Long term (current) use of insulin: Secondary | ICD-10-CM | POA: Diagnosis not present

## 2020-06-06 LAB — HEMOGLOBIN A1C
Est. average glucose Bld gHb Est-mCnc: 120 mg/dL
Hgb A1c MFr Bld: 5.8 % — ABNORMAL HIGH (ref 4.8–5.6)

## 2020-06-06 LAB — COMPREHENSIVE METABOLIC PANEL
ALT: 13 IU/L (ref 0–32)
AST: 16 IU/L (ref 0–40)
Albumin/Globulin Ratio: 1.6 (ref 1.2–2.2)
Albumin: 4.6 g/dL (ref 3.7–4.7)
Alkaline Phosphatase: 64 IU/L (ref 44–121)
BUN/Creatinine Ratio: 32 — ABNORMAL HIGH (ref 12–28)
BUN: 25 mg/dL (ref 8–27)
Bilirubin Total: 0.4 mg/dL (ref 0.0–1.2)
CO2: 26 mmol/L (ref 20–29)
Calcium: 9.4 mg/dL (ref 8.7–10.3)
Chloride: 101 mmol/L (ref 96–106)
Creatinine, Ser: 0.78 mg/dL (ref 0.57–1.00)
GFR calc Af Amer: 84 mL/min/{1.73_m2} (ref >59)
GFR calc non Af Amer: 73 mL/min/{1.73_m2} (ref >59)
Globulin, Total: 2.8 g/dL (ref 1.5–4.5)
Glucose: 116 mg/dL — ABNORMAL HIGH (ref 65–99)
Potassium: 4.3 mmol/L (ref 3.5–5.2)
Sodium: 140 mmol/L (ref 134–144)
Total Protein: 7.4 g/dL (ref 6.0–8.5)

## 2020-06-06 LAB — LIPID PANEL
Chol/HDL Ratio: 2.3 ratio (ref 0.0–4.4)
Cholesterol, Total: 156 mg/dL (ref 100–199)
HDL: 68 mg/dL (ref >39)
LDL Chol Calc (NIH): 72 mg/dL (ref 0–99)
Triglycerides: 89 mg/dL (ref 0–149)
VLDL Cholesterol Cal: 16 mg/dL (ref 5–40)

## 2020-06-06 LAB — VITAMIN D 25 HYDROXY (VIT D DEFICIENCY, FRACTURES): Vit D, 25-Hydroxy: 67 ng/mL (ref 30.0–100.0)

## 2020-06-06 LAB — INSULIN, RANDOM: INSULIN: 14.5 u[IU]/mL (ref 2.6–24.9)

## 2020-06-06 NOTE — Progress Notes (Signed)
Chief Complaint:   Kathleen [name] is here to discuss her progress with her obesity treatment plan along with follow-up of her obesity related diagnoses. Kathleen [name] is on keeping a food journal and adhering to recommended goals of 1100-1200 calories and 80 grams of protein and states she is following her eating plan approximately 50% of the time. Kathleen [name] states she is using the exercise bike for 20 minutes 2-3 times per week.  Today's visit was #: 56 Starting weight: 259 lbs Starting date: 04/12/2018 Today's weight: 225 lbs Today's date: 06/05/2020 Total lbs lost to date: 34 lbs Total lbs lost since last in-office visit: 0  Interim History: Kathleen [name] states that she was on vacation at [employer] and did not journal as consistently.  She will be celebrating Christmas and New Year's with her family.  Subjective:   1. Type 2 diabetes mellitus without complication, with long-term current use of insulin ([facility]) Fasting blood sugar 119-136.  On metformin 500 mg daily.  No nausea, vomiting, diarrhea, or polyphagia.  No hypoglycemia.  Last A1c 5.9.  Lab Results  Component Value Date   HGBA1C 5.8 (H) 06/05/2020   HGBA1C 5.9 (H) 02/09/2020   HGBA1C 5.8 (H) 06/07/2019   Lab Results  Component Value Date   LDLCALC 72 06/05/2020   CREATININE 0.78 06/05/2020   Lab Results  Component Value Date   INSULIN 14.5 06/05/2020   INSULIN 17.6 02/09/2020   INSULIN 19.5 06/07/2019   INSULIN 18.5 01/11/2019   INSULIN 24.9 08/24/2018   2. Essential hypertension Followed by PCP.  BP controlled.  No headache or chest pain.  BP Readings from Last 3 Encounters:  06/05/20 122/72  05/15/20 120/67  04/16/20 121/69   Assessment/Plan:   1. Type 2 diabetes mellitus without complication, with long-term current use of insulin ([place]) Continue with medications, meal plan, and check A1c in 3 months.  - Comprehensive metabolic panel - Hemoglobin A1c - Insulin, random - Lipid panel - VITAMIN D 25  Hydroxy (Vit-D Deficiency, Fractures)  2. Essential hypertension Will continue to check blood pressure every visit and she will follow-up with Dr. Kenton Kingfisher.  3. Class 2 severe obesity with serious comorbidity and body mass index (BMI) of 38.0 to 38.9 in adult, unspecified obesity type [facility])  Kathleen [name] is currently in the action stage of change. As such, her goal is to continue with weight loss efforts. She has agreed to keeping a food journal and adhering to recommended goals of 1100 calories and 75 grams of protein.   Exercise goals: As is.  Behavioral modification strategies: meal planning and cooking strategies and planning for success.  Kathleen [name] has agreed to follow-up with our clinic in 3 weeks. She was informed of the importance of frequent follow-up visits to maximize her success with intensive lifestyle modifications for her multiple health conditions.   Objective:   Blood pressure 122/72, pulse (!) 58, temperature 98 F (36.7 C), height 5\' 4"  (1.626 m), weight 225 lb (102.1 kg), SpO2 97 %. Body mass index is 38.62 kg/m.  General: Cooperative, alert, well developed, in no acute distress. HEENT: Conjunctivae and lids unremarkable. Cardiovascular: Regular rhythm.  Lungs: Normal work of breathing. Neurologic: No focal deficits.   Lab Results  Component Value Date   CREATININE 0.77 02/09/2020   BUN 30 (H) 02/09/2020   NA 137 02/09/2020   K 3.9 02/09/2020   CL 97 02/09/2020   CO2 27 02/09/2020   Lab Results  Component Value Date   ALT 13 02/09/2020  AST 14 02/09/2020   ALKPHOS 67 02/09/2020   BILITOT 0.3 02/09/2020   Lab Results  Component Value Date   HGBA1C 5.9 (H) 02/09/2020   HGBA1C 5.8 (H) 06/07/2019   HGBA1C 5.9 (H) 01/11/2019   HGBA1C 6.2 (H) 08/24/2018   HGBA1C 6.8 (H) 04/12/2018   Lab Results  Component Value Date   INSULIN 17.6 02/09/2020   INSULIN 19.5 06/07/2019   INSULIN 18.5 01/11/2019   INSULIN 24.9 08/24/2018   INSULIN 30.6 (H) 04/12/2018    Lab Results  Component Value Date   TSH 1.480 04/12/2018   Lab Results  Component Value Date   CHOL 146 02/09/2020   HDL 62 02/09/2020   LDLCALC 70 02/09/2020   TRIG 74 02/09/2020   CHOLHDL 2.4 02/09/2020   Lab Results  Component Value Date   WBC 7.1 04/12/2018   HGB 12.8 04/12/2018   HCT 37.8 04/12/2018   MCV 89 04/12/2018   PLT 230 12/14/2014   Obesity Behavioral Intervention:   Approximately 15 minutes were spent on the discussion below.  ASK: We discussed the diagnosis of obesity with Kathleen [name] today and Kathleen [name] agreed to give [name] permission to discuss obesity behavioral modification therapy today.  ASSESS: Kathleen [name] has the diagnosis of obesity and her BMI today is 38.8. Kathleen [name] is in the action stage of change.   ADVISE: Kathleen [name] was educated on the multiple health risks of obesity as well as the benefit of weight loss to improve her health. She was advised of the need for long term treatment and the importance of lifestyle modifications to improve her current health and to decrease her risk of future health problems.  AGREE: Multiple dietary modification options and treatment options were discussed and Kathleen [name] agreed to follow the recommendations documented in the above note.  ARRANGE: Kathleen [name] was educated on the importance of frequent visits to treat obesity as outlined per CMS and USPSTF guidelines and agreed to schedule her next follow up appointment today.  Attestation Statements:   Reviewed by clinician on day of visit: allergies, medications, problem list, medical history, surgical history, family history, social history, and previous encounter notes.  I, Water quality scientist, CMA, am acting as transcriptionist for Abby Potash, PA-C  I have reviewed the above documentation for accuracy and completeness, and I agree with the above. Abby Potash, PA-C

## 2020-06-25 ENCOUNTER — Ambulatory Visit (INDEPENDENT_AMBULATORY_CARE_PROVIDER_SITE_OTHER): Payer: Medicare Other | Admitting: Physician Assistant

## 2020-06-25 ENCOUNTER — Encounter (INDEPENDENT_AMBULATORY_CARE_PROVIDER_SITE_OTHER): Payer: Self-pay | Admitting: Physician Assistant

## 2020-06-25 ENCOUNTER — Other Ambulatory Visit: Payer: Self-pay

## 2020-06-25 VITALS — BP 144/74 | HR 57 | Temp 97.6°F | Ht 64.0 in | Wt 225.0 lb

## 2020-06-25 DIAGNOSIS — E785 Hyperlipidemia, unspecified: Secondary | ICD-10-CM

## 2020-06-25 DIAGNOSIS — Z6838 Body mass index (BMI) 38.0-38.9, adult: Secondary | ICD-10-CM

## 2020-06-25 DIAGNOSIS — E1169 Type 2 diabetes mellitus with other specified complication: Secondary | ICD-10-CM

## 2020-06-25 DIAGNOSIS — E559 Vitamin D deficiency, unspecified: Secondary | ICD-10-CM | POA: Diagnosis not present

## 2020-06-26 NOTE — Progress Notes (Unsigned)
Chief Complaint:   [place] is here to discuss her progress with her obesity treatment plan along with follow-up of her obesity related diagnoses. Kathleen [name] is on keeping a food journal and adhering to recommended goals of 1100 calories and 75 grams of protein daily and states she is following her eating plan approximately 60% of the time. Kathleen [name] states she is riding the bike for 10-20 minutes 4 times per week.  Today's visit was #: 37 Starting weight: 259 lbs Starting date: 04/12/2018 Today's weight: 225 lbs Today's date: 06/25/2020 Total lbs lost to date: 34 Total lbs lost since last in-office visit: 0  Interim History: Kathleen [name] averaged between 1300-1500 calories over the holiday. She is on her stationary bike 4 times per week. She is ready to get back on track.  Subjective:   1. Type 2 diabetes mellitus with hyperlipidemia ([place]) Kathleen [name]'s last A1c was 5.8 and she is on metformin.  2. Vitamin D deficiency Kathleen [name]'s last Vit D level was at goal at 67.0.   Assessment/Plan:   1. Type 2 diabetes mellitus with hyperlipidemia (HCC) Good blood sugar control is important to decrease the likelihood of diabetic complications such as nephropathy, neuropathy, limb loss, blindness, coronary artery disease, and death. Intensive lifestyle modification including diet, exercise and weight loss are the first line of treatment for diabetes. Kathleen [name] will continue her medications, and we will refill glucose blood strips #100 with no refills.  2. Vitamin D deficiency Low Vitamin D level contributes to fatigue and are associated with obesity, breast, and colon cancer. Kathleen [name] agreed to discontinue Vitamin D 1,000 units and continue Vitamin D weekly 50,000 IU weekly. She will follow-up for routine testing of Vitamin D, at least 2-3 times per year to avoid over-replacement.  3. Class 2 severe obesity with serious comorbidity and body mass index (BMI) of 38.0 to 38.9 in adult, unspecified  obesity type [name]) Kathleen [name] is currently in the action stage of change. As such, her goal is to continue with weight loss efforts. She has agreed to keeping a food journal and adhering to recommended goals of 1100 calories and 85 grams of protein daily.   Exercise goals: As is.  Behavioral modification strategies: decreasing simple carbohydrates, planning for success and keeping a strict food journal.  [name] has agreed to follow-up with our clinic in 2 weeks. She was informed of the importance of frequent follow-up visits to maximize her success with intensive lifestyle modifications for her multiple health conditions.   Objective:   Blood pressure (!) 144/74, pulse (!) 57, temperature 97.6 F (36.4 C), height 5\' 4"  (1.626 m), weight 225 lb (102.1 kg), SpO2 97 %. Body mass index is 38.62 kg/m.  General: Cooperative, alert, well developed, in no acute distress. HEENT: Conjunctivae and lids unremarkable. Cardiovascular: Regular rhythm.  Lungs: Normal work of breathing. Neurologic: No focal deficits.   Lab Results  Component Value Date   CREATININE 0.78 06/05/2020   BUN 25 06/05/2020   NA 140 06/05/2020   K 4.3 06/05/2020   CL 101 06/05/2020   CO2 26 06/05/2020   Lab Results  Component Value Date   ALT 13 06/05/2020   AST 16 06/05/2020   ALKPHOS 64 06/05/2020   BILITOT 0.4 06/05/2020   Lab Results  Component Value Date   HGBA1C 5.8 (H) 06/05/2020   HGBA1C 5.9 (H) 02/09/2020   HGBA1C 5.8 (H) 06/07/2019   HGBA1C 5.9 (H) 01/11/2019   HGBA1C 6.2 (H) 08/24/2018   Lab Results  Component  Value Date   INSULIN 14.5 06/05/2020   INSULIN 17.6 02/09/2020   INSULIN 19.5 06/07/2019   INSULIN 18.5 01/11/2019   INSULIN 24.9 08/24/2018   Lab Results  Component Value Date   TSH 1.480 04/12/2018   Lab Results  Component Value Date   CHOL 156 06/05/2020   HDL 68 06/05/2020   LDLCALC 72 06/05/2020   TRIG 89 06/05/2020   CHOLHDL 2.3 06/05/2020   Lab Results  Component  Value Date   WBC 7.1 04/12/2018   HGB 12.8 04/12/2018   HCT 37.8 04/12/2018   MCV 89 04/12/2018   PLT 230 12/14/2014   No results found for: IRON, TIBC, FERRITIN  Obesity Behavioral Intervention:   Approximately 15 minutes were spent on the discussion below.  ASK: We discussed the diagnosis of obesity with Kathleen [name] today and Kathleen [name] agreed to give [name] permission to discuss obesity behavioral modification therapy today.  ASSESS: [name] has the diagnosis of obesity and her BMI today is 38.6. Kathleen [name] is in the action stage of change.   ADVISE: [name] was educated on the multiple health risks of obesity as well as the benefit of weight loss to improve her health. She was advised of the need for long term treatment and the importance of lifestyle modifications to improve her current health and to decrease her risk of future health problems.  AGREE: Multiple dietary modification options and treatment options were discussed and Kathleen [name] agreed to follow the recommendations documented in the above note.  ARRANGE: Kathleen [name] was educated on the importance of frequent visits to treat obesity as outlined per CMS and USPSTF guidelines and agreed to schedule her next follow up appointment today.  Attestation Statements:   Reviewed by clinician on day of visit: allergies, medications, problem list, medical history, surgical history, family history, social history, and previous encounter notes.   Wilhemena Durie, am acting as transcriptionist for [employer], PA-C.  I have reviewed the above documentation for accuracy and completeness, and I agree with the above. Abby Potash, PA-C

## 2020-06-29 MED ORDER — GLUCOSE BLOOD VI STRP: ORAL_STRIP | 0 refills | Status: DC

## 2020-07-06 DIAGNOSIS — Z853 Personal history of malignant neoplasm of breast: Secondary | ICD-10-CM | POA: Diagnosis not present

## 2020-07-06 DIAGNOSIS — E782 Mixed hyperlipidemia: Secondary | ICD-10-CM | POA: Diagnosis not present

## 2020-07-06 DIAGNOSIS — K219 Gastro-esophageal reflux disease without esophagitis: Secondary | ICD-10-CM | POA: Diagnosis not present

## 2020-07-06 DIAGNOSIS — E1165 Type 2 diabetes mellitus with hyperglycemia: Secondary | ICD-10-CM | POA: Diagnosis not present

## 2020-07-06 DIAGNOSIS — E1169 Type 2 diabetes mellitus with other specified complication: Secondary | ICD-10-CM | POA: Diagnosis not present

## 2020-07-06 DIAGNOSIS — I10 Essential (primary) hypertension: Secondary | ICD-10-CM | POA: Diagnosis not present

## 2020-07-06 DIAGNOSIS — Z85038 Personal history of other malignant neoplasm of large intestine: Secondary | ICD-10-CM | POA: Diagnosis not present

## 2020-07-09 ENCOUNTER — Encounter (INDEPENDENT_AMBULATORY_CARE_PROVIDER_SITE_OTHER): Payer: Self-pay | Admitting: Physician Assistant

## 2020-07-09 ENCOUNTER — Ambulatory Visit (INDEPENDENT_AMBULATORY_CARE_PROVIDER_SITE_OTHER): Payer: Medicare Other | Admitting: Physician Assistant

## 2020-07-09 ENCOUNTER — Other Ambulatory Visit: Payer: Self-pay

## 2020-07-09 VITALS — BP 153/75 | HR 57 | Temp 98.6°F | Ht 64.0 in | Wt 226.0 lb

## 2020-07-09 DIAGNOSIS — E785 Hyperlipidemia, unspecified: Secondary | ICD-10-CM | POA: Diagnosis not present

## 2020-07-09 DIAGNOSIS — E559 Vitamin D deficiency, unspecified: Secondary | ICD-10-CM | POA: Diagnosis not present

## 2020-07-09 DIAGNOSIS — Z6838 Body mass index (BMI) 38.0-38.9, adult: Secondary | ICD-10-CM | POA: Diagnosis not present

## 2020-07-09 DIAGNOSIS — E1169 Type 2 diabetes mellitus with other specified complication: Secondary | ICD-10-CM

## 2020-07-09 MED ORDER — VITAMIN D (ERGOCALCIFEROL) 1.25 MG (50000 UNIT) PO CAPS: 50000.0000 [IU] | ORAL_CAPSULE | ORAL | 0 refills | Status: DC

## 2020-07-09 MED ORDER — METFORMIN HCL 500 MG PO TABS: 500.0000 mg | ORAL_TABLET | Freq: Every day | ORAL | 0 refills | Status: DC

## 2020-07-10 NOTE — Progress Notes (Signed)
Chief Complaint:   Kathleen [name] is here to discuss her progress with her obesity treatment plan along with follow-up of her obesity related diagnoses. Kathleen [name] is on keeping a food journal and adhering to recommended goals of 1100 calories and 85 grams of protein daily and states she is following her eating plan approximately 75-80% of the time. Kathleen [name] states she is riding the stationary bike for 20 minutes 3-4 times per week.  Today's visit was #: 54 Starting weight: 259 lbs Starting date: 04/12/2018 Today's weight: 226 lbs Today's date: 07/09/2020 Total lbs lost to date: 33 Total lbs lost since last in-office visit: 0  Interim History: Kathleen [name] reports that she has had her grandkids for the last 2 weeks, which has been very disruptive to her routine. She feels that she may have over-snacked some days.  Subjective:   1. Type 2 diabetes mellitus with hyperlipidemia (HCC) Kathleen [name]'s fasting BGs range between 107 and 130. Last A1c was 5.8. She is on metformin and she denies hypoglycemia.  2. Vitamin D deficiency Kathleen [name] is on Vit D weekly, and she denies nausea, vomiting, or muscle weakness.  Assessment/Plan:   1. Type 2 diabetes mellitus with hyperlipidemia (HCC) Good blood sugar control is important to decrease the likelihood of diabetic complications such as nephropathy, neuropathy, limb loss, blindness, coronary artery disease, and death. Intensive lifestyle modification including diet, exercise and weight loss are the first line of treatment for diabetes. We will refill metformin for 90 days with no refills.  - metFORMIN (GLUCOPHAGE) 500 MG tablet; Take 1 tablet (500 mg total) by mouth daily with breakfast.  Dispense: 90 tablet; Refill: 0  2. Vitamin D deficiency Low Vitamin D level contributes to fatigue and are associated with obesity, breast, and colon cancer. We will refill prescription Vitamin D for 90 days with no refills. Kathleen [name] will follow-up for routine  testing of Vitamin D, at least 2-3 times per year to avoid over-replacement.  - Vitamin D, Ergocalciferol, (DRISDOL) 1.25 MG (50000 UNIT) CAPS capsule; Take 1 capsule (50,000 Units total) by mouth every 7 (seven) days.  Dispense: 12 capsule; Refill: 0  3. Class 2 severe obesity with serious comorbidity and body mass index (BMI) of 38.0 to 38.9 in adult, unspecified obesity type [facility]) Kathleen [name] is currently in the action stage of change. As such, her goal is to continue with weight loss efforts. She has agreed to keeping a food journal and adhering to recommended goals of 1100 calories and 85 grams of protein daily.   Exercise goals: As is.  Behavioral modification strategies: planning for success and keeping a strict food journal.  Kathleen [name] has agreed to follow-up with our clinic in 3 weeks. She was informed of the importance of frequent follow-up visits to maximize her success with intensive lifestyle modifications for her multiple health conditions.   Objective:   Blood pressure (!) 153/75, pulse (!) 57, temperature 98.6 F (37 C), height 5\' 4"  (1.626 m), weight 226 lb (102.5 kg), SpO2 98 %. Body mass index is 38.79 kg/m.  General: Cooperative, alert, well developed, in no acute distress. HEENT: Conjunctivae and lids unremarkable. Cardiovascular: Regular rhythm.  Lungs: Normal work of breathing. Neurologic: No focal deficits.   Lab Results  Component Value Date   CREATININE 0.78 06/05/2020   BUN 25 06/05/2020   NA 140 06/05/2020   K 4.3 06/05/2020   CL 101 06/05/2020   CO2 26 06/05/2020   Lab Results  Component Value Date   ALT 13 06/05/2020  AST 16 06/05/2020   ALKPHOS 64 06/05/2020   BILITOT 0.4 06/05/2020   Lab Results  Component Value Date   HGBA1C 5.8 (H) 06/05/2020   HGBA1C 5.9 (H) 02/09/2020   HGBA1C 5.8 (H) 06/07/2019   HGBA1C 5.9 (H) 01/11/2019   HGBA1C 6.2 (H) 08/24/2018   Lab Results  Component Value Date   INSULIN 14.5 06/05/2020   INSULIN 17.6  02/09/2020   INSULIN 19.5 06/07/2019   INSULIN 18.5 01/11/2019   INSULIN 24.9 08/24/2018   Lab Results  Component Value Date   TSH 1.480 04/12/2018   Lab Results  Component Value Date   CHOL 156 06/05/2020   HDL 68 06/05/2020   LDLCALC 72 06/05/2020   TRIG 89 06/05/2020   CHOLHDL 2.3 06/05/2020   Lab Results  Component Value Date   WBC 7.1 04/12/2018   HGB 12.8 04/12/2018   HCT 37.8 04/12/2018   MCV 89 04/12/2018   PLT 230 12/14/2014   No results found for: IRON, TIBC, FERRITIN  Obesity Behavioral Intervention:   Approximately 15 minutes were spent on the discussion below.  ASK: We discussed the diagnosis of obesity with Kathleen [name] today and Kathleen [name] agreed to give [name] permission to discuss obesity behavioral modification therapy today.  ASSESS: Kathleen [name] has the diagnosis of obesity and her BMI today is 38.77. Kathleen [name] is in the action stage of change.   ADVISE: Kathleen [name] was educated on the multiple health risks of obesity as well as the benefit of weight loss to improve her health. She was advised of the need for long term treatment and the importance of lifestyle modifications to improve her current health and to decrease her risk of future health problems.  AGREE: Multiple dietary modification options and treatment options were discussed and Kathleen [name] agreed to follow the recommendations documented in the above note.  ARRANGE: Kathleen [name] was educated on the importance of frequent visits to treat obesity as outlined per CMS and USPSTF guidelines and agreed to schedule her next follow up appointment today.  Attestation Statements:   Reviewed by clinician on day of visit: allergies, medications, problem list, medical history, surgical history, family history, social history, and previous encounter notes.   Kathleen [name], am acting as transcriptionist for [employer], PA-C.  I have reviewed the above documentation for accuracy and completeness, and I agree with the  above. Kathleen Potash, PA-C

## 2020-07-16 DIAGNOSIS — G4733 Obstructive sleep apnea (adult) (pediatric): Secondary | ICD-10-CM | POA: Diagnosis not present

## 2020-07-30 ENCOUNTER — Ambulatory Visit (INDEPENDENT_AMBULATORY_CARE_PROVIDER_SITE_OTHER): Payer: Medicare Other | Admitting: Physician Assistant

## 2020-07-30 ENCOUNTER — Other Ambulatory Visit: Payer: Self-pay

## 2020-07-30 ENCOUNTER — Encounter (INDEPENDENT_AMBULATORY_CARE_PROVIDER_SITE_OTHER): Payer: Self-pay | Admitting: Physician Assistant

## 2020-07-30 VITALS — BP 140/58 | HR 55 | Temp 97.8°F | Ht 64.0 in | Wt 224.0 lb

## 2020-07-30 DIAGNOSIS — Z6838 Body mass index (BMI) 38.0-38.9, adult: Secondary | ICD-10-CM

## 2020-07-30 DIAGNOSIS — E785 Hyperlipidemia, unspecified: Secondary | ICD-10-CM

## 2020-07-30 DIAGNOSIS — E1169 Type 2 diabetes mellitus with other specified complication: Secondary | ICD-10-CM

## 2020-07-30 DIAGNOSIS — E559 Vitamin D deficiency, unspecified: Secondary | ICD-10-CM

## 2020-07-30 DIAGNOSIS — E66812 Obesity, class 2: Secondary | ICD-10-CM

## 2020-07-30 DIAGNOSIS — E669 Obesity, unspecified: Secondary | ICD-10-CM | POA: Diagnosis not present

## 2020-07-31 NOTE — Progress Notes (Signed)
Chief Complaint:   Kathleen [name] is here to discuss her progress with her obesity treatment plan along with follow-up of her obesity related diagnoses. Kathleen [name] is on keeping a food journal and adhering to recommended goals of 1100 calories and 85 grams of protein daily and states she is following her eating plan approximately 80% of the time. Kathleen [name] states she is riding the exercise bike for 15-25 minutes 3 times per week.  Today's visit was #: 40 Starting weight: 259 lbs Starting date: 04/12/2018 Today's weight: 224 lbs Today's date: 07/30/2020 Total lbs lost to date: 35 Total lbs lost since last in-office visit: 2  Interim History: Kathleen [name] reports that she has had her granddaughter for the last 2 weeks, but she has done a better job doing less snacking. She is traveling to [place] next week.  Subjective:   1. Vitamin D deficiency Kathleen [name] is on Vit D, and she denies nausea, vomiting, or muscle weakness. Last Vit D level was 67.0, at goal.   2. Type 2 diabetes mellitus with hyperlipidemia (HCC) Kathleen [name]'s fasting BGs range between 107 and 133. She denies hypoglycemia. Last A1c was 5.8.  Assessment/Plan:   1. Vitamin D deficiency Low Vitamin D level contributes to fatigue and are associated with obesity, breast, and colon cancer. Kathleen [name] agreed to continue taking prescription Vitamin D 50,000 IU every week and will follow-up for routine testing of Vitamin D, at least 2-3 times per year to avoid over-replacement.  2. Type 2 diabetes mellitus with hyperlipidemia (HCC) Good blood sugar control is important to decrease the likelihood of diabetic complications such as nephropathy, neuropathy, limb loss, blindness, coronary artery disease, and death. Intensive lifestyle modification including diet, exercise and weight loss are the first line of treatment for diabetes. Kathleen [name] agreed to continue taking metformin, no refill needed.  3. Class 2 severe obesity with serious  comorbidity and body mass index (BMI) of 38.0 to 38.9 in adult, unspecified obesity type [facility]) Kathleen [name] is currently in the action stage of change. As such, her goal is to continue with weight loss efforts. She has agreed to keeping a food journal and adhering to recommended goals of 1100 calories and 85 grams of protein daily.   Exercise goals: As is.  Behavioral modification strategies: travel eating strategies and keeping a strict food journal.  Kathleen [name] has agreed to follow-up with our clinic in 3 weeks. She was informed of the importance of frequent follow-up visits to maximize her success with intensive lifestyle modifications for her multiple health conditions.   Objective:   Blood pressure (!) 140/58, pulse (!) 55, temperature 97.8 F (36.6 C), height 5\' 4"  (1.626 m), weight 224 lb (101.6 kg), SpO2 99 %. Body mass index is 38.45 kg/m.  General: Cooperative, alert, well developed, in no acute distress. HEENT: Conjunctivae and lids unremarkable. Cardiovascular: Regular rhythm.  Lungs: Normal work of breathing. Neurologic: No focal deficits.   Lab Results  Component Value Date   CREATININE 0.78 06/05/2020   BUN 25 06/05/2020   NA 140 06/05/2020   K 4.3 06/05/2020   CL 101 06/05/2020   CO2 26 06/05/2020   Lab Results  Component Value Date   ALT 13 06/05/2020   AST 16 06/05/2020   ALKPHOS 64 06/05/2020   BILITOT 0.4 06/05/2020   Lab Results  Component Value Date   HGBA1C 5.8 (H) 06/05/2020   HGBA1C 5.9 (H) 02/09/2020   HGBA1C 5.8 (H) 06/07/2019   HGBA1C 5.9 (H) 01/11/2019   HGBA1C 6.2 (H)  08/24/2018   Lab Results  Component Value Date   INSULIN 14.5 06/05/2020   INSULIN 17.6 02/09/2020   INSULIN 19.5 06/07/2019   INSULIN 18.5 01/11/2019   INSULIN 24.9 08/24/2018   Lab Results  Component Value Date   TSH 1.480 04/12/2018   Lab Results  Component Value Date   CHOL 156 06/05/2020   HDL 68 06/05/2020   LDLCALC 72 06/05/2020   TRIG 89 06/05/2020   CHOLHDL  2.3 06/05/2020   Lab Results  Component Value Date   WBC 7.1 04/12/2018   HGB 12.8 04/12/2018   HCT 37.8 04/12/2018   MCV 89 04/12/2018   PLT 230 12/14/2014   No results found for: IRON, TIBC, FERRITIN  Obesity Behavioral Intervention:   Approximately 15 minutes were spent on the discussion below.  ASK: We discussed the diagnosis of obesity with Kathleen [name] today and Kathleen [name] agreed to give [name] permission to discuss obesity behavioral modification therapy today.  ASSESS: Kathleen [name] has the diagnosis of obesity and her BMI today is 38.43. Kathleen [name] is in the action stage of change.   ADVISE: Kathleen [name] was educated on the multiple health risks of obesity as well as the benefit of weight loss to improve her health. She was advised of the need for long term treatment and the importance of lifestyle modifications to improve her current health and to decrease her risk of future health problems.  AGREE: Multiple dietary modification options and treatment options were discussed and Kathleen [name] agreed to follow the recommendations documented in the above note.  ARRANGE: Kathleen [name] was educated on the importance of frequent visits to treat obesity as outlined per CMS and USPSTF guidelines and agreed to schedule her next follow up appointment today.  Attestation Statements:   Reviewed by clinician on day of visit: allergies, medications, problem list, medical history, surgical history, family history, social history, and previous encounter notes.   Wilhemena Durie, am acting as transcriptionist for [employer], PA-C.  I have reviewed the above documentation for accuracy and completeness, and I agree with the above. Abby Potash, PA-C

## 2020-08-01 ENCOUNTER — Telehealth (INDEPENDENT_AMBULATORY_CARE_PROVIDER_SITE_OTHER): Payer: Self-pay | Admitting: Physician Assistant

## 2020-08-01 DIAGNOSIS — I10 Essential (primary) hypertension: Secondary | ICD-10-CM | POA: Diagnosis not present

## 2020-08-01 DIAGNOSIS — E782 Mixed hyperlipidemia: Secondary | ICD-10-CM | POA: Diagnosis not present

## 2020-08-01 DIAGNOSIS — Z85038 Personal history of other malignant neoplasm of large intestine: Secondary | ICD-10-CM | POA: Diagnosis not present

## 2020-08-01 DIAGNOSIS — E1165 Type 2 diabetes mellitus with hyperglycemia: Secondary | ICD-10-CM | POA: Diagnosis not present

## 2020-08-01 DIAGNOSIS — E1169 Type 2 diabetes mellitus with other specified complication: Secondary | ICD-10-CM | POA: Diagnosis not present

## 2020-08-01 DIAGNOSIS — K219 Gastro-esophageal reflux disease without esophagitis: Secondary | ICD-10-CM | POA: Diagnosis not present

## 2020-08-01 DIAGNOSIS — Z853 Personal history of malignant neoplasm of breast: Secondary | ICD-10-CM | POA: Diagnosis not present

## 2020-08-01 NOTE — Telephone Encounter (Signed)
Pt needs a refill on Metformin and Vit D to [facility]

## 2020-08-01 NOTE — Telephone Encounter (Signed)
Last OV with Tracey 

## 2020-08-06 ENCOUNTER — Other Ambulatory Visit (INDEPENDENT_AMBULATORY_CARE_PROVIDER_SITE_OTHER): Payer: Self-pay

## 2020-08-06 DIAGNOSIS — E559 Vitamin D deficiency, unspecified: Secondary | ICD-10-CM

## 2020-08-06 DIAGNOSIS — E1169 Type 2 diabetes mellitus with other specified complication: Secondary | ICD-10-CM

## 2020-08-06 MED ORDER — METFORMIN HCL 500 MG PO TABS: 500.0000 mg | ORAL_TABLET | Freq: Every day | ORAL | 0 refills | Status: DC

## 2020-08-06 MED ORDER — VITAMIN D (ERGOCALCIFEROL) 1.25 MG (50000 UNIT) PO CAPS: 50000.0000 [IU] | ORAL_CAPSULE | ORAL | 0 refills | Status: DC

## 2020-08-06 NOTE — Telephone Encounter (Signed)
Prescription was sent to pharmacy. Kathleen [name], [place]

## 2020-08-20 ENCOUNTER — Ambulatory Visit (INDEPENDENT_AMBULATORY_CARE_PROVIDER_SITE_OTHER): Payer: Medicare Other | Admitting: Physician Assistant

## 2020-08-20 ENCOUNTER — Encounter (INDEPENDENT_AMBULATORY_CARE_PROVIDER_SITE_OTHER): Payer: Self-pay | Admitting: Physician Assistant

## 2020-08-20 ENCOUNTER — Other Ambulatory Visit: Payer: Self-pay

## 2020-08-20 VITALS — BP 120/74 | HR 54 | Temp 98.0°F | Ht 64.0 in | Wt 225.0 lb

## 2020-08-20 DIAGNOSIS — E1169 Type 2 diabetes mellitus with other specified complication: Secondary | ICD-10-CM | POA: Diagnosis not present

## 2020-08-20 DIAGNOSIS — E785 Hyperlipidemia, unspecified: Secondary | ICD-10-CM

## 2020-08-20 DIAGNOSIS — Z6838 Body mass index (BMI) 38.0-38.9, adult: Secondary | ICD-10-CM

## 2020-08-20 DIAGNOSIS — E66812 Obesity, class 2: Secondary | ICD-10-CM

## 2020-08-21 ENCOUNTER — Encounter (INDEPENDENT_AMBULATORY_CARE_PROVIDER_SITE_OTHER): Payer: Self-pay | Admitting: Physician Assistant

## 2020-08-21 NOTE — Telephone Encounter (Signed)
Please review

## 2020-08-21 NOTE — Progress Notes (Signed)
Chief Complaint:   Kathleen [name] is here to discuss her progress with her obesity treatment plan along with follow-up of her obesity related diagnoses. Kathleen [name] is on keeping a food journal and adhering to recommended goals of 1100 calories and 85 grams of protein daily and states she is following her eating plan approximately 80% of the time. Kathleen [name] states she is riding the exercise bike for 15 minutes 3 times per week.  Today's visit was #: 21 Starting weight: 259 lbs Starting date: 04/12/2018 Today's weight: 225 lbs Today's date: 08/20/2020 Total lbs lost to date: 34 Total lbs lost since last in-office visit: 0  Interim History: Kathleen [name] just returned from [place] where she made good choices for the most part, but she states portion control may have been an issue. When at home she is overeating her calories on some days due to hunger.  Subjective:   1. Type 2 diabetes mellitus with hyperlipidemia ([place]) [place]'s fasting BGs are slightly elevated after traveling to [place]. Her fasting BGs ranged between 124 and 145. We discussed Ozempic to help decrease her appetite.  2. Hyperlipidemia associated with type 2 diabetes mellitus ([place]) Kathleen [name] is on Zocor, and she denies myalgias or chest pain. She is managed by her primary care physician. She is exercising regularly.  Assessment/Plan:   1. Type 2 diabetes mellitus with hyperlipidemia (HCC) Good blood sugar control is important to decrease the likelihood of diabetic complications such as nephropathy, neuropathy, limb loss, blindness, coronary artery disease, and death. Intensive lifestyle modification including diet, exercise and weight loss are the first line of treatment for diabetes. Kathleen [name] is to discuss Ozempic or other GLP-1 with her primary care physician, and she will continue metformin.  2. Hyperlipidemia associated with type 2 diabetes mellitus ([place]) Cardiovascular risk and specific lipid/LDL goals  reviewed. We discussed several lifestyle modifications today. Kathleen [name] will follow up with her primary care physician, and will continue her medications, diet, exercise and weight loss efforts. Orders and follow up as documented in patient record.   Counseling Intensive lifestyle modifications are the first line treatment for this issue. . Dietary changes: Increase soluble fiber. Decrease simple carbohydrates. . Exercise changes: Moderate to vigorous-intensity aerobic activity 150 minutes per week if tolerated. . Lipid-lowering medications: see documented in medical record.  3. Class 2 severe obesity with serious comorbidity and body mass index (BMI) of 38.0 to 38.9 in adult, unspecified obesity type [facility]) Kathleen [name] is currently in the action stage of change. As such, her goal is to continue with weight loss efforts. She has agreed to keeping a food journal and adhering to recommended goals of 1100-1150 calories and 85 grams of protein daily.   Exercise goals: As is.  Behavioral modification strategies: meal planning and cooking strategies and planning for success.  Kathleen [name] has agreed to follow-up with our clinic in 3 weeks. She was informed of the importance of frequent follow-up visits to maximize her success with intensive lifestyle modifications for her multiple health conditions.   Objective:   Blood pressure 120/74, pulse (!) 54, temperature 98 F (36.7 C), height 5\' 4"  (1.626 m), weight 225 lb (102.1 kg), SpO2 99 %. Body mass index is 38.62 kg/m.  General: Cooperative, alert, well developed, in no acute distress. HEENT: Conjunctivae and lids unremarkable. Cardiovascular: Regular rhythm.  Lungs: Normal work of breathing. Neurologic: No focal deficits.   Lab Results  Component Value Date   CREATININE 0.78 06/05/2020   BUN 25 06/05/2020   NA 140 06/05/2020  K 4.3 06/05/2020   CL 101 06/05/2020   CO2 26 06/05/2020   Lab Results  Component Value Date   ALT 13 06/05/2020    AST 16 06/05/2020   ALKPHOS 64 06/05/2020   BILITOT 0.4 06/05/2020   Lab Results  Component Value Date   HGBA1C 5.8 (H) 06/05/2020   HGBA1C 5.9 (H) 02/09/2020   HGBA1C 5.8 (H) 06/07/2019   HGBA1C 5.9 (H) 01/11/2019   HGBA1C 6.2 (H) 08/24/2018   Lab Results  Component Value Date   INSULIN 14.5 06/05/2020   INSULIN 17.6 02/09/2020   INSULIN 19.5 06/07/2019   INSULIN 18.5 01/11/2019   INSULIN 24.9 08/24/2018   Lab Results  Component Value Date   TSH 1.480 04/12/2018   Lab Results  Component Value Date   CHOL 156 06/05/2020   HDL 68 06/05/2020   LDLCALC 72 06/05/2020   TRIG 89 06/05/2020   CHOLHDL 2.3 06/05/2020   Lab Results  Component Value Date   WBC 7.1 04/12/2018   HGB 12.8 04/12/2018   HCT 37.8 04/12/2018   MCV 89 04/12/2018   PLT 230 12/14/2014   No results found for: IRON, TIBC, FERRITIN  Obesity Behavioral Intervention:   Approximately 15 minutes were spent on the discussion below.  ASK: We discussed the diagnosis of obesity with Kathleen [name] today and Kathleen [name] agreed to give [name] permission to discuss obesity behavioral modification therapy today.  ASSESS: Kathleen [name] has the diagnosis of obesity and her BMI today is 38.6. Kathleen [name] is in the action stage of change.   ADVISE: Kathleen [name] was educated on the multiple health risks of obesity as well as the benefit of weight loss to improve her health. She was advised of the need for long term treatment and the importance of lifestyle modifications to improve her current health and to decrease her risk of future health problems.  AGREE: Multiple dietary modification options and treatment options were discussed and Kathleen [name] agreed to follow the recommendations documented in the above note.  ARRANGE: Kathleen [name] was educated on the importance of frequent visits to treat obesity as outlined per CMS and USPSTF guidelines and agreed to schedule her next follow up appointment today.  Attestation Statements:   Reviewed by  clinician on day of visit: allergies, medications, problem list, medical history, surgical history, family history, social history, and previous encounter notes.   Wilhemena Durie, am acting as transcriptionist for [employer], PA-C.  I have reviewed the above documentation for accuracy and completeness, and I agree with the above. Abby Potash, PA-C

## 2020-08-27 ENCOUNTER — Ambulatory Visit (INDEPENDENT_AMBULATORY_CARE_PROVIDER_SITE_OTHER): Payer: Medicare Other | Admitting: Physician Assistant

## 2020-09-02 IMAGING — MG DIGITAL SCREENING BILAT W/ TOMO W/ CAD
8 series · 8 of 24 positions shown · non-contrast
Comparison: Previous exam(s).

CLINICAL DATA: Screening.

EXAM:
DIGITAL SCREENING BILATERAL MAMMOGRAM WITH TOMO AND CAD

[L MLO synth-2D]
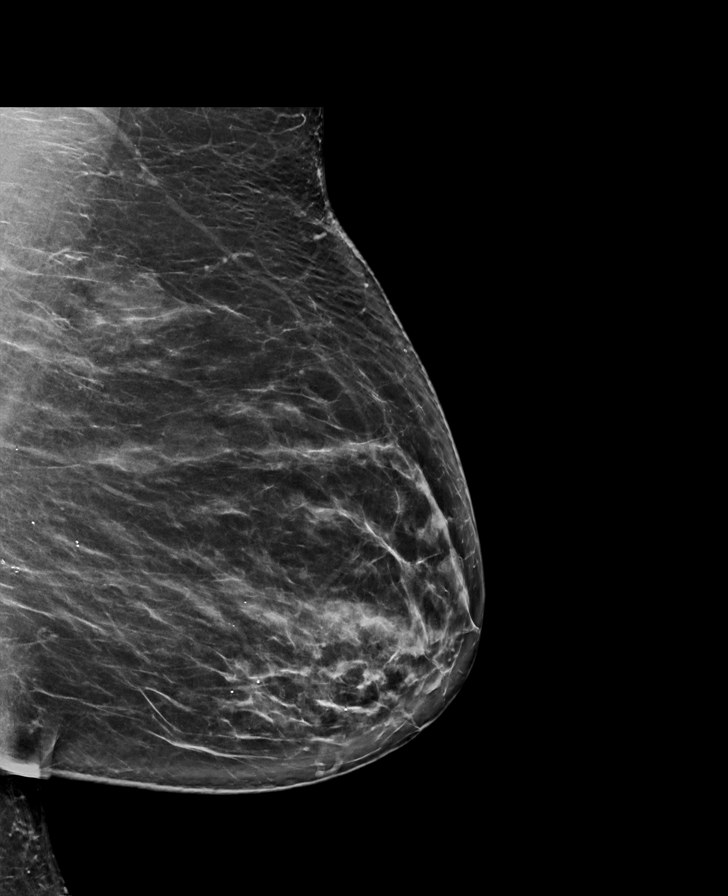

[R CC synth-2D]
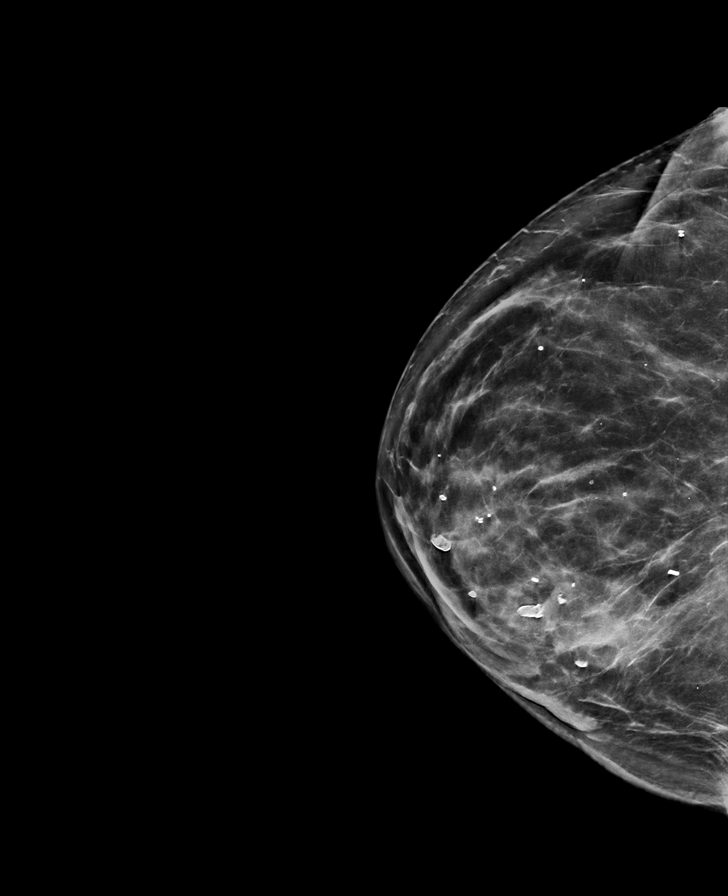

[L CC synth-2D]
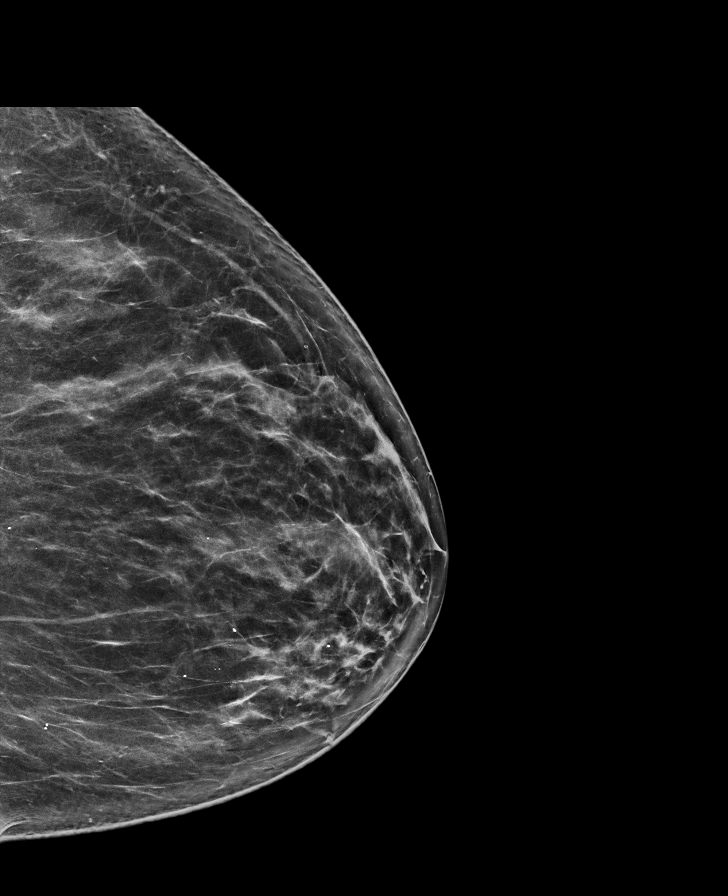

[R MLO synth-2D]
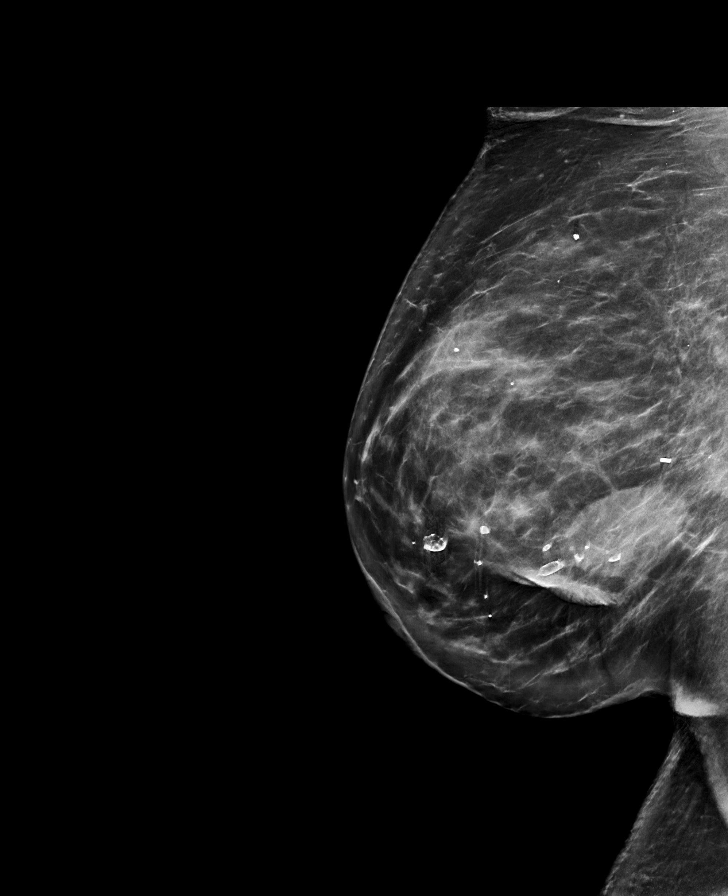

[R MLO tomo · tomo slice 49/98.0]
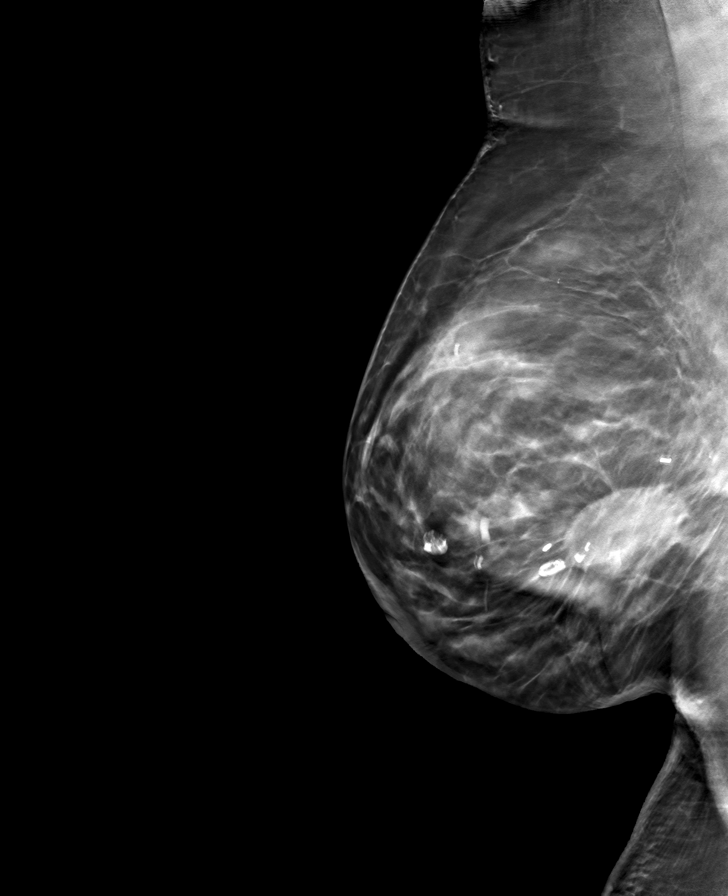

[L MLO tomo · tomo slice 44/87.0]
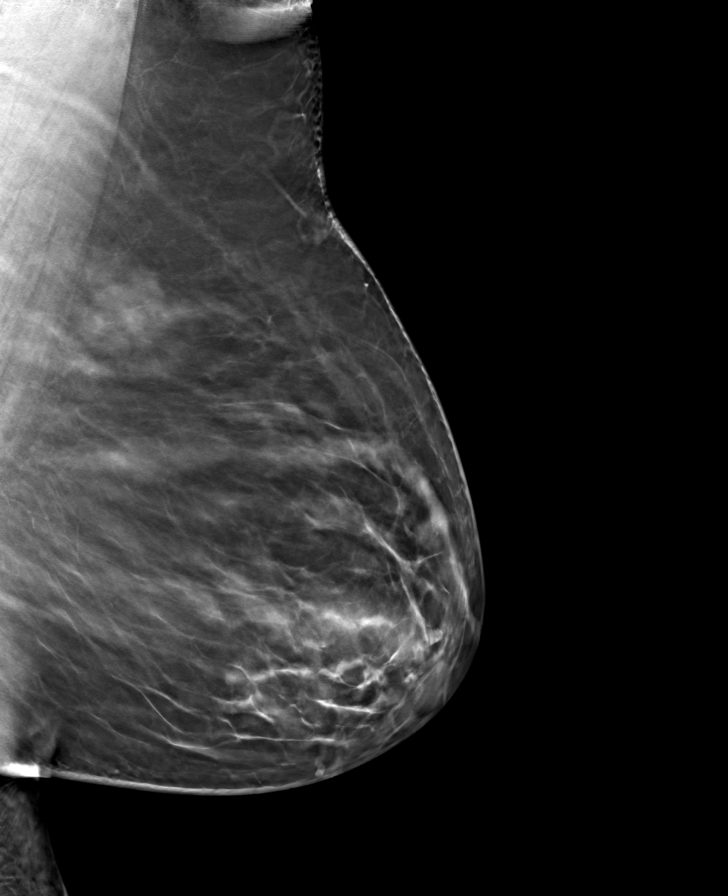

[L CC tomo · tomo slice 40/79.0]
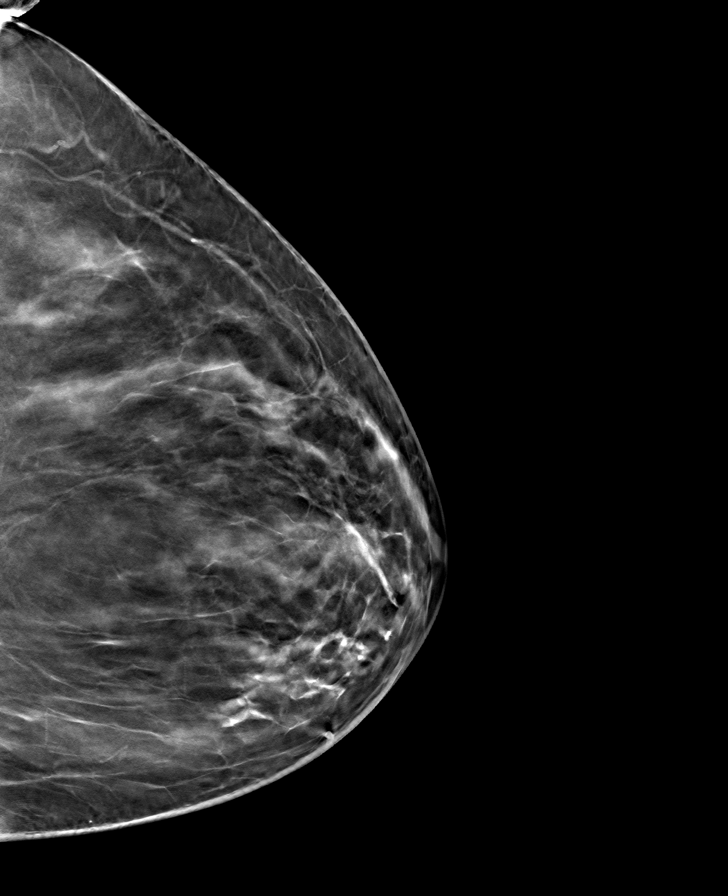

[R CC tomo · tomo slice 47/94.0]
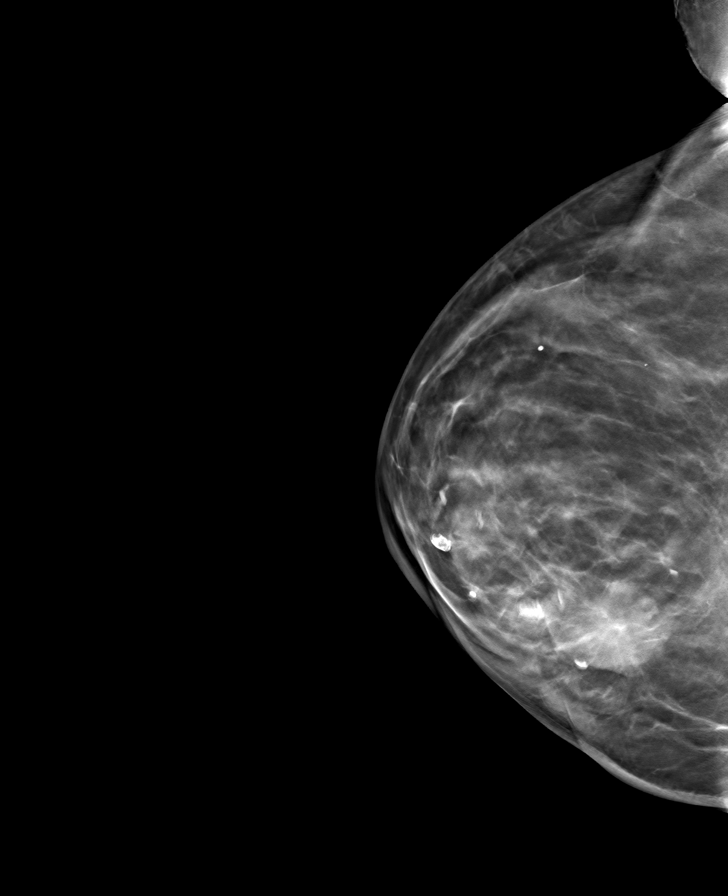

[8 of 24 positions shown; findings below may reference images not displayed]

ACR Breast Density Category c: The breast tissue is heterogeneously
dense, which may obscure small masses.
FINDINGS: There are no findings suspicious for malignancy. Images were
processed with CAD.
IMPRESSION: No mammographic evidence of malignancy. A result letter of this
screening mammogram will be mailed directly to the patient.

RECOMMENDATION:
Screening mammogram in one year. (Code:[id number])

BI-RADS CATEGORY  1: Negative.

## 2020-09-10 ENCOUNTER — Encounter (INDEPENDENT_AMBULATORY_CARE_PROVIDER_SITE_OTHER): Payer: Self-pay | Admitting: Physician Assistant

## 2020-09-10 ENCOUNTER — Other Ambulatory Visit (INDEPENDENT_AMBULATORY_CARE_PROVIDER_SITE_OTHER): Payer: Self-pay | Admitting: Physician Assistant

## 2020-09-10 ENCOUNTER — Other Ambulatory Visit: Payer: Self-pay

## 2020-09-10 ENCOUNTER — Ambulatory Visit (INDEPENDENT_AMBULATORY_CARE_PROVIDER_SITE_OTHER): Payer: Medicare Other | Admitting: Physician Assistant

## 2020-09-10 VITALS — BP 146/66 | HR 50 | Temp 98.0°F | Ht 64.0 in | Wt 225.0 lb

## 2020-09-10 DIAGNOSIS — E785 Hyperlipidemia, unspecified: Secondary | ICD-10-CM

## 2020-09-10 DIAGNOSIS — R1011 Right upper quadrant pain: Secondary | ICD-10-CM | POA: Diagnosis not present

## 2020-09-10 DIAGNOSIS — Z6841 Body Mass Index (BMI) 40.0 and over, adult: Secondary | ICD-10-CM | POA: Diagnosis not present

## 2020-09-10 DIAGNOSIS — E1169 Type 2 diabetes mellitus with other specified complication: Secondary | ICD-10-CM

## 2020-09-10 DIAGNOSIS — E119 Type 2 diabetes mellitus without complications: Secondary | ICD-10-CM

## 2020-09-10 DIAGNOSIS — E559 Vitamin D deficiency, unspecified: Secondary | ICD-10-CM

## 2020-09-10 MED ORDER — ONETOUCH ULTRA 2 W/DEVICE KIT
PACK | 0 refills | Status: DC
Start: 2020-09-10 — End: 2023-04-09

## 2020-09-11 LAB — CBC WITH DIFFERENTIAL/PLATELET
Basophils Absolute: 0.1 10*3/uL (ref 0.0–0.2)
Basos: 1 %
EOS (ABSOLUTE): 0.1 10*3/uL (ref 0.0–0.4)
Eos: 2 %
Hematocrit: 36.8 % (ref 34.0–46.6)
Hemoglobin: 12.1 g/dL (ref 11.1–15.9)
Immature Grans (Abs): 0 10*3/uL (ref 0.0–0.1)
Immature Granulocytes: 0 %
Lymphocytes Absolute: 1.9 10*3/uL (ref 0.7–3.1)
Lymphs: 25 %
MCH: 29.7 pg (ref 26.6–33.0)
MCHC: 32.9 g/dL (ref 31.5–35.7)
MCV: 90 fL (ref 79–97)
Monocytes Absolute: 0.4 10*3/uL (ref 0.1–0.9)
Monocytes: 6 %
Neutrophils Absolute: 5 10*3/uL (ref 1.4–7.0)
Neutrophils: 66 %
Platelets: 273 10*3/uL (ref 150–450)
RBC: 4.07 x10E6/uL (ref 3.77–5.28)
RDW: 12.3 % (ref 11.7–15.4)
WBC: 7.5 10*3/uL (ref 3.4–10.8)

## 2020-09-11 LAB — COMPREHENSIVE METABOLIC PANEL
ALT: 14 IU/L (ref 0–32)
AST: 16 IU/L (ref 0–40)
Albumin/Globulin Ratio: 1.3 (ref 1.2–2.2)
Albumin: 4.2 g/dL (ref 3.7–4.7)
Alkaline Phosphatase: 63 IU/L (ref 44–121)
BUN/Creatinine Ratio: 29 — ABNORMAL HIGH (ref 12–28)
BUN: 26 mg/dL (ref 8–27)
Bilirubin Total: 0.3 mg/dL (ref 0.0–1.2)
CO2: 25 mmol/L (ref 20–29)
Calcium: 9.4 mg/dL (ref 8.7–10.3)
Chloride: 97 mmol/L (ref 96–106)
Creatinine, Ser: 0.89 mg/dL (ref 0.57–1.00)
Globulin, Total: 3.2 g/dL (ref 1.5–4.5)
Glucose: 105 mg/dL — ABNORMAL HIGH (ref 65–99)
Potassium: 3.9 mmol/L (ref 3.5–5.2)
Sodium: 136 mmol/L (ref 134–144)
Total Protein: 7.4 g/dL (ref 6.0–8.5)
eGFR: 66 mL/min/{1.73_m2} (ref >59)

## 2020-09-11 LAB — HEMOGLOBIN A1C
Est. average glucose Bld gHb Est-mCnc: 108 mg/dL
Hgb A1c MFr Bld: 5.4 % (ref 4.8–5.6)

## 2020-09-11 LAB — LIPID PANEL
Chol/HDL Ratio: 2.4 ratio (ref 0.0–4.4)
Cholesterol, Total: 151 mg/dL (ref 100–199)
HDL: 63 mg/dL (ref >39)
LDL Chol Calc (NIH): 73 mg/dL (ref 0–99)
Triglycerides: 76 mg/dL (ref 0–149)
VLDL Cholesterol Cal: 15 mg/dL (ref 5–40)

## 2020-09-11 LAB — VITAMIN D 25 HYDROXY (VIT D DEFICIENCY, FRACTURES): Vit D, 25-Hydroxy: 66.1 ng/mL (ref 30.0–100.0)

## 2020-09-11 LAB — INSULIN, RANDOM: INSULIN: 14.3 u[IU]/mL (ref 2.6–24.9)

## 2020-09-11 NOTE — Telephone Encounter (Signed)
Kathleen [name] 

## 2020-09-12 NOTE — Progress Notes (Signed)
Chief Complaint:   Kathleen [name] is here to discuss her progress with her obesity treatment plan along with follow-up of her obesity related diagnoses. Kathleen [name] is on keeping a food journal and adhering to recommended goals of 1100-1150 calories and 85 grams of protein daily and states she is following her eating plan approximately 100% of the time. Kathleen [name] states she is bike riding for 15-20 minutes 2 times per week.  Today's visit was #: 70 Starting weight: 259 lbs Starting date: 04/12/2018 Today's weight: 225 lbs Today's date: 09/10/2020 Total lbs lost to date: 34 Total lbs lost since last in-office visit: 0  Interim History: Kathleen [name] reports that she was unsuccessful with keeping her calories at 1100 calories many days due to hunger. She is to speak with her primary care physician in regards to GLP-1s.  Subjective:   1. Type 2 diabetes mellitus without complication, without long-term current use of insulin (HCC) Kathleen [name]'s fasting BGs range between 105 and 120's. She is on metformin, and she denies nausea, vomiting, or diarrhea.  2. Abdominal pain, right upper quadrant Kathleen [name]'s upper GI pain, not associated with certain foods. She denies nausea, vomiting, or diarrhea. She denies blood in her stool or black stools.  3. Vitamin D deficiency Kathleen [name] is on Vit D, and she denies nausea, vomiting, or muscle weakness.  Assessment/Plan:   1. Type 2 diabetes mellitus without complication, without long-term current use of insulin (HCC) Good blood sugar control is important to decrease the likelihood of diabetic complications such as nephropathy, neuropathy, limb loss, blindness, coronary artery disease, and death. Intensive lifestyle modification including diet, exercise and weight loss are the first line of treatment for diabetes. We will check labs today. Kathleen [name] will continue her meal plan, and we will refill glucose test strips.  - Blood Glucose Monitoring Suppl (ONE TOUCH  ULTRA 2) w/Device KIT; Use to check blood sugar two times a day  Dispense: 1 kit; Refill: 0 - Comprehensive metabolic panel - Hemoglobin A1c - Insulin, random - Lipid panel  2. Abdominal pain, right upper quadrant Kathleen [name]'s pain is possibly cholecystitis, and she will follow up with her primary care physician today. We will check labs today.   - CBC with Differential/Platelet  3. Vitamin D deficiency Low Vitamin D level contributes to fatigue and are associated with obesity, breast, and colon cancer. We will check labs today. Kathleen [name] agreed to continue taking prescription Vitamin D 50,000 IU every week and will follow-up for routine testing of Vitamin D, at least 2-3 times per year to avoid over-replacement.  - VITAMIN D 25 Hydroxy (Vit-D Deficiency, Fractures)  4. Class 3 severe obesity with serious comorbidity and body mass index (BMI) of 40.0 to 44.9 in adult, unspecified obesity type Kathleen [name]) Kathleen [name] is currently in the action stage of change. As such, her goal is to continue with weight loss efforts. She has agreed to keeping a food journal and adhering to recommended goals of 1100-1200 calories and 85 grams of protein daily.   We will recheck her IC at her next visit.  Exercise goals: As is.  Behavioral modification strategies: meal planning and cooking strategies and planning for success.  Kathleen [name] has agreed to follow-up with our clinic in 3 weeks. She was informed of the importance of frequent follow-up visits to maximize her success with intensive lifestyle modifications for her multiple health conditions.   Kathleen [name] was informed we would discuss her lab results at her next visit unless there is a critical issue that needs  to be addressed sooner. Kathleen [name] agreed to keep her next visit at the agreed upon time to discuss these results.  Objective:   Blood pressure (!) 146/66, pulse (!) 50, temperature 98 F (36.7 C), height 5' 4"  (1.626 m), weight 225 lb (102.1 kg), SpO2 99  %. Body mass index is 38.62 kg/m.  General: Cooperative, alert, well developed, in no acute distress. HEENT: Conjunctivae and lids unremarkable. Cardiovascular: Regular rhythm.  Lungs: Normal work of breathing. Neurologic: No focal deficits.   Lab Results  Component Value Date   CREATININE 0.89 09/10/2020   BUN 26 09/10/2020   NA 136 09/10/2020   K 3.9 09/10/2020   CL 97 09/10/2020   CO2 25 09/10/2020   Lab Results  Component Value Date   ALT 14 09/10/2020   AST 16 09/10/2020   ALKPHOS 63 09/10/2020   BILITOT 0.3 09/10/2020   Lab Results  Component Value Date   HGBA1C 5.4 09/10/2020   HGBA1C 5.8 (H) 06/05/2020   HGBA1C 5.9 (H) 02/09/2020   HGBA1C 5.8 (H) 06/07/2019   HGBA1C 5.9 (H) 01/11/2019   Lab Results  Component Value Date   INSULIN 14.3 09/10/2020   INSULIN 14.5 06/05/2020   INSULIN 17.6 02/09/2020   INSULIN 19.5 06/07/2019   INSULIN 18.5 01/11/2019   Lab Results  Component Value Date   TSH 1.480 04/12/2018   Lab Results  Component Value Date   CHOL 151 09/10/2020   HDL 63 09/10/2020   LDLCALC 73 09/10/2020   TRIG 76 09/10/2020   CHOLHDL 2.4 09/10/2020   Lab Results  Component Value Date   WBC 7.5 09/10/2020   HGB 12.1 09/10/2020   HCT 36.8 09/10/2020   MCV 90 09/10/2020   PLT 273 09/10/2020   No results found for: IRON, TIBC, FERRITIN  Obesity Behavioral Intervention:   Approximately 15 minutes were spent on the discussion below.  ASK: We discussed the diagnosis of obesity with Kathleen [name] today and Kathleen [name] agreed to give [name] permission to discuss obesity behavioral modification therapy today.  ASSESS: Kathleen [name] has the diagnosis of obesity and her BMI today is 38.6. Kathleen [name] is in the action stage of change.   ADVISE: Kathleen [name] was educated on the multiple health risks of obesity as well as the benefit of weight loss to improve her health. She was advised of the need for long term treatment and the importance of lifestyle modifications to  improve her current health and to decrease her risk of future health problems.  AGREE: Multiple dietary modification options and treatment options were discussed and Kathleen [name] agreed to follow the recommendations documented in the above note.  ARRANGE: Kathleen [name] was educated on the importance of frequent visits to treat obesity as outlined per CMS and USPSTF guidelines and agreed to schedule her next follow up appointment today.  Attestation Statements:   Reviewed by clinician on day of visit: allergies, medications, problem list, medical history, surgical history, family history, social history, and previous encounter notes.   Kathleen [name], am acting as transcriptionist for [employer], PA-C.  I have reviewed the above documentation for accuracy and completeness, and I agree with the above. -  *Kathleen Potash, PA-C

## 2020-09-19 ENCOUNTER — Other Ambulatory Visit (INDEPENDENT_AMBULATORY_CARE_PROVIDER_SITE_OTHER): Payer: Self-pay | Admitting: Physician Assistant

## 2020-09-19 DIAGNOSIS — E1169 Type 2 diabetes mellitus with other specified complication: Secondary | ICD-10-CM

## 2020-09-19 DIAGNOSIS — E785 Hyperlipidemia, unspecified: Secondary | ICD-10-CM

## 2020-09-19 DIAGNOSIS — R1011 Right upper quadrant pain: Secondary | ICD-10-CM | POA: Diagnosis not present

## 2020-09-19 DIAGNOSIS — R197 Diarrhea, unspecified: Secondary | ICD-10-CM | POA: Diagnosis not present

## 2020-09-19 DIAGNOSIS — Z85038 Personal history of other malignant neoplasm of large intestine: Secondary | ICD-10-CM | POA: Diagnosis not present

## 2020-09-19 NOTE — Telephone Encounter (Signed)
Pt last seen by Tracey Aguilar, PA-C.  

## 2020-09-21 ENCOUNTER — Other Ambulatory Visit: Payer: Self-pay | Admitting: Family Medicine

## 2020-09-21 DIAGNOSIS — R1011 Right upper quadrant pain: Secondary | ICD-10-CM

## 2020-10-03 ENCOUNTER — Encounter (INDEPENDENT_AMBULATORY_CARE_PROVIDER_SITE_OTHER): Payer: Self-pay | Admitting: Physician Assistant

## 2020-10-03 ENCOUNTER — Ambulatory Visit (INDEPENDENT_AMBULATORY_CARE_PROVIDER_SITE_OTHER): Payer: Medicare Other | Admitting: Physician Assistant

## 2020-10-03 ENCOUNTER — Other Ambulatory Visit: Payer: Self-pay

## 2020-10-03 VITALS — BP 173/73 | HR 51 | Temp 98.4°F | Ht 64.0 in | Wt 226.0 lb

## 2020-10-03 DIAGNOSIS — E559 Vitamin D deficiency, unspecified: Secondary | ICD-10-CM | POA: Diagnosis not present

## 2020-10-03 DIAGNOSIS — E1169 Type 2 diabetes mellitus with other specified complication: Secondary | ICD-10-CM

## 2020-10-03 DIAGNOSIS — E785 Hyperlipidemia, unspecified: Secondary | ICD-10-CM | POA: Diagnosis not present

## 2020-10-03 DIAGNOSIS — Z6841 Body Mass Index (BMI) 40.0 and over, adult: Secondary | ICD-10-CM

## 2020-10-03 MED ORDER — METFORMIN HCL 500 MG PO TABS: 500.0000 mg | ORAL_TABLET | Freq: Every day | ORAL | 0 refills | Status: DC

## 2020-10-03 MED ORDER — VITAMIN D (ERGOCALCIFEROL) 1.25 MG (50000 UNIT) PO CAPS: 50000.0000 [IU] | ORAL_CAPSULE | ORAL | 0 refills | Status: DC

## 2020-10-05 ENCOUNTER — Ambulatory Visit
Admission: RE | Admit: 2020-10-05 | Discharge: 2020-10-05 | Disposition: A | Discharge status: Home / Self Care | Payer: Medicare Other | Source: Ambulatory Visit | Attending: Family Medicine | Admitting: Family Medicine

## 2020-10-05 DIAGNOSIS — R1011 Right upper quadrant pain: Secondary | ICD-10-CM

## 2020-10-05 DIAGNOSIS — K760 Fatty (change of) liver, not elsewhere classified: Secondary | ICD-10-CM | POA: Diagnosis not present

## 2020-10-08 NOTE — Progress Notes (Signed)
Chief Complaint:   Kathleen [name] is here to discuss her progress with her obesity treatment plan along with follow-up of her obesity related diagnoses. [name] is on keeping a food journal and adhering to recommended goals of 1100-1200 calories and 85 grams of protein daily and states she is following her eating plan approximately 25% of the time. [name] states she is riding the stationary bike for 20-25 minutes 3 times per week.  Today's visit was #: 69 Starting weight: 259 lbs Starting date: 04/12/2018 Today's weight: 226 lbs Today's date: 10/03/2020 Total lbs lost to date: 33 Total lbs lost since last in-office visit: 0  Interim History: [name] was at the beach recently and she ate out more often than usual. She states that she did not get enough protein.  Subjective:   1. Vitamin D deficiency [name] is on Vit D, and she denies nausea, vomiting, or muscle weakness. Last Vit D level was at goal. I discussed labs with the patient today.  2. Type 2 diabetes mellitus with hyperlipidemia ([place]) Brycelyn's last A1c was 5.4 on metformin. She is followed by her primary care physician. I discussed labs with the patient today.  Assessment/Plan:   1. Vitamin D deficiency Low Vitamin D level contributes to fatigue and are associated with obesity, breast, and colon cancer. We will refill prescription Vitamin D for 90 days with no refills. [name] will follow-up for routine testing of Vitamin D, at least 2-3 times per year to avoid over-replacement.  - Vitamin D, Ergocalciferol, (DRISDOL) 1.25 MG (50000 UNIT) CAPS capsule; Take 1 capsule (50,000 Units total) by mouth every 7 (seven) days.  Dispense: 12 capsule; Refill: 0  2. Type 2 diabetes mellitus with hyperlipidemia (HCC) Good blood sugar control is important to decrease the likelihood of diabetic complications such as nephropathy, neuropathy, limb loss, blindness, coronary artery disease, and death. Intensive lifestyle  modification including diet, exercise and weight loss are the first line of treatment for diabetes. We will refill metformin for 1 month. [name] will continue to follow up with her primary care physician.  - metFORMIN (GLUCOPHAGE) 500 MG tablet; Take 1 tablet (500 mg total) by mouth daily with breakfast.  Dispense: 30 tablet; Refill: 0  3. Class 3 severe obesity with serious comorbidity and body mass index (BMI) of 40.0 to 44.9 in adult, unspecified obesity type [facility]) [name] is currently in the action stage of change. As such, her goal is to continue with weight loss efforts. She has agreed to keeping a food journal and adhering to recommended goals of 1100-1200 calories and 85 grams of protein daily.   Exercise goals: As is.  Behavioral modification strategies: meal planning and cooking strategies and keeping healthy foods in the home.  [name] has agreed to follow-up with our clinic in 4 weeks. She was informed of the importance of frequent follow-up visits to maximize her success with intensive lifestyle modifications for her multiple health conditions.   Objective:   Blood pressure (!) 173/73, pulse (!) 51, temperature 98.4 F (36.9 C), height 5\' 4"  (1.626 m), weight 226 lb (102.5 kg), SpO2 98 %. Body mass index is 38.79 kg/m.  General: Cooperative, alert, well developed, in no acute distress. HEENT: Conjunctivae and lids unremarkable. Cardiovascular: Regular rhythm.  Lungs: Normal work of breathing. Neurologic: No focal deficits.   Lab Results  Component Value Date   CREATININE 0.89 09/10/2020   BUN 26 09/10/2020   NA 136 09/10/2020   K 3.9 09/10/2020   CL 97  09/10/2020   CO2 25 09/10/2020   Lab Results  Component Value Date   ALT 14 09/10/2020   AST 16 09/10/2020   ALKPHOS 63 09/10/2020   BILITOT 0.3 09/10/2020   Lab Results  Component Value Date   HGBA1C 5.4 09/10/2020   HGBA1C 5.8 (H) 06/05/2020   HGBA1C 5.9 (H) 02/09/2020   HGBA1C 5.8 (H) 06/07/2019    HGBA1C 5.9 (H) 01/11/2019   Lab Results  Component Value Date   INSULIN 14.3 09/10/2020   INSULIN 14.5 06/05/2020   INSULIN 17.6 02/09/2020   INSULIN 19.5 06/07/2019   INSULIN 18.5 01/11/2019   Lab Results  Component Value Date   TSH 1.480 04/12/2018   Lab Results  Component Value Date   CHOL 151 09/10/2020   HDL 63 09/10/2020   LDLCALC 73 09/10/2020   TRIG 76 09/10/2020   CHOLHDL 2.4 09/10/2020   Lab Results  Component Value Date   WBC 7.5 09/10/2020   HGB 12.1 09/10/2020   HCT 36.8 09/10/2020   MCV 90 09/10/2020   PLT 273 09/10/2020   No results found for: IRON, TIBC, FERRITIN  Obesity Behavioral Intervention:   Approximately 15 minutes were spent on the discussion below.  ASK: We discussed the diagnosis of obesity with [name] today and [name] agreed to give [name] permission to discuss obesity behavioral modification therapy today.  ASSESS: [name] has the diagnosis of obesity and her BMI today is 38.77. [name] is in the action stage of change.   ADVISE: [name] was educated on the multiple health risks of obesity as well as the benefit of weight loss to improve her health. She was advised of the need for long term treatment and the importance of lifestyle modifications to improve her current health and to decrease her risk of future health problems.  AGREE: Multiple dietary modification options and treatment options were discussed and [name] agreed to follow the recommendations documented in the above note.  ARRANGE: [name] was educated on the importance of frequent visits to treat obesity as outlined per CMS and USPSTF guidelines and agreed to schedule her next follow up appointment today.  Attestation Statements:   Reviewed by clinician on day of visit: allergies, medications, problem list, medical history, surgical history, family history, social history, and previous encounter notes.   Wilhemena Durie, am acting as transcriptionist for [employer], PA-C.  I have reviewed the above documentation for accuracy and completeness, and I agree with the above. Abby Potash, PA-C

## 2020-10-24 DIAGNOSIS — H1045 Other chronic allergic conjunctivitis: Secondary | ICD-10-CM | POA: Diagnosis not present

## 2020-10-24 DIAGNOSIS — H43393 Other vitreous opacities, bilateral: Secondary | ICD-10-CM | POA: Diagnosis not present

## 2020-10-24 DIAGNOSIS — H35039 Hypertensive retinopathy, unspecified eye: Secondary | ICD-10-CM | POA: Diagnosis not present

## 2020-10-24 DIAGNOSIS — H43813 Vitreous degeneration, bilateral: Secondary | ICD-10-CM | POA: Diagnosis not present

## 2020-10-24 DIAGNOSIS — I10 Essential (primary) hypertension: Secondary | ICD-10-CM | POA: Diagnosis not present

## 2020-10-24 DIAGNOSIS — Z9849 Cataract extraction status, unspecified eye: Secondary | ICD-10-CM | POA: Diagnosis not present

## 2020-10-24 DIAGNOSIS — E7800 Pure hypercholesterolemia, unspecified: Secondary | ICD-10-CM | POA: Diagnosis not present

## 2020-10-24 DIAGNOSIS — H5212 Myopia, left eye: Secondary | ICD-10-CM | POA: Diagnosis not present

## 2020-10-25 ENCOUNTER — Other Ambulatory Visit (INDEPENDENT_AMBULATORY_CARE_PROVIDER_SITE_OTHER): Payer: Self-pay | Admitting: Physician Assistant

## 2020-10-25 DIAGNOSIS — E1169 Type 2 diabetes mellitus with other specified complication: Secondary | ICD-10-CM

## 2020-10-29 NOTE — Telephone Encounter (Signed)
Would you like to refill or wait until pt's next office visit on 5/23?  Please also advise if 90 day Rx is okay.

## 2020-10-29 NOTE — Telephone Encounter (Signed)
Ok to fill 30 days. Thanks

## 2020-11-05 ENCOUNTER — Other Ambulatory Visit (INDEPENDENT_AMBULATORY_CARE_PROVIDER_SITE_OTHER): Payer: Self-pay | Admitting: Physician Assistant

## 2020-11-05 ENCOUNTER — Ambulatory Visit (INDEPENDENT_AMBULATORY_CARE_PROVIDER_SITE_OTHER): Payer: Medicare Other | Admitting: Family Medicine

## 2020-11-05 DIAGNOSIS — E1169 Type 2 diabetes mellitus with other specified complication: Secondary | ICD-10-CM

## 2020-11-05 NOTE — Telephone Encounter (Signed)
Pt last seen by Tracey Aguilar, PA-C.  

## 2020-11-07 ENCOUNTER — Ambulatory Visit (INDEPENDENT_AMBULATORY_CARE_PROVIDER_SITE_OTHER): Payer: Medicare Other | Admitting: Physician Assistant

## 2020-11-07 ENCOUNTER — Other Ambulatory Visit: Payer: Self-pay

## 2020-11-07 ENCOUNTER — Encounter (INDEPENDENT_AMBULATORY_CARE_PROVIDER_SITE_OTHER): Payer: Self-pay | Admitting: Physician Assistant

## 2020-11-07 VITALS — BP 126/68 | HR 55 | Temp 98.0°F | Ht 64.0 in | Wt 226.0 lb

## 2020-11-07 DIAGNOSIS — E785 Hyperlipidemia, unspecified: Secondary | ICD-10-CM

## 2020-11-07 DIAGNOSIS — K760 Fatty (change of) liver, not elsewhere classified: Secondary | ICD-10-CM | POA: Diagnosis not present

## 2020-11-07 DIAGNOSIS — E1169 Type 2 diabetes mellitus with other specified complication: Secondary | ICD-10-CM | POA: Diagnosis not present

## 2020-11-07 DIAGNOSIS — Z6841 Body Mass Index (BMI) 40.0 and over, adult: Secondary | ICD-10-CM

## 2020-11-07 NOTE — Progress Notes (Signed)
Chief Complaint:   Kathleen [name] is here to discuss her progress with her obesity treatment plan along with follow-up of her obesity related diagnoses. Kathleen [name] is on keeping a food journal and adhering to recommended goals of 1100-1200 calories and 85 grams of protein daily and states she is following her eating plan approximately 50% of the time. Kathleen [name] states she is bike riding for 15-20 minutes 3 times per week.  Today's visit was #: 70 Starting weight: 259 lbs Starting date: 04/12/2018 Today's weight: 226 lbs Today's date: 11/07/2020 Total lbs lost to date: 33 Total lbs lost since last in-office visit: 0  Interim History: Kathleen [name] did well maintaining her weight. She continues to struggle with keeping her calories between 1100-1200 due to increased hunger. Her protein reaches 100 grams on most days.  Subjective:   1. Type 2 diabetes mellitus with hyperlipidemia (HCC) Kathleen [name]'s fasting BGs range between 110-138. She is on metformin, and her last A1c was 5.4. Once again we discussed starting Ozempic. She wants to speak with her primary care physician.  2. Fatty liver Kathleen [name] was diagnosed by recent ultrasound. Once again we discussed GLP-1, Ozempic, especially with indirection for fatty liver upcoming.  Assessment/Plan:   1. Type 2 diabetes mellitus with hyperlipidemia (Kathleen [name]) We will speak with primary care physician in regards to starting Ozempic. Good blood sugar control is important to decrease the likelihood of diabetic complications such as nephropathy, neuropathy, limb loss, blindness, coronary artery disease, and death. Intensive lifestyle modification including diet, exercise and weight loss are the first line of treatment for diabetes.   2. Fatty liver We discussed the likely diagnosis of non-alcoholic fatty liver disease today and how this condition is obesity related. Kathleen [name] was educated the importance of weight loss. Kathleen [name] is to discussed [place] with her  primary care physician and continue with weight loss. She agreed to continue with her weight loss efforts with healthier diet and exercise as an essential part of her treatment plan.  3. Class 3 severe obesity with serious comorbidity and body mass index (BMI) of 40.0 to 44.9 in adult, unspecified obesity type [facility]) Kathleen [name] is currently in the action stage of change. As such, her goal is to continue with weight loss efforts. She has agreed to keeping a food journal and adhering to recommended goals of 1100-1200 calories and 85 grams of protein daily.   We will recheck IC at her next visit.  Exercise goals: As is.  Behavioral modification strategies: meal planning and cooking strategies and keeping healthy foods in the home.  Kathleen [name] has agreed to follow-up with our clinic in 4 to 5 weeks. She was informed of the importance of frequent follow-up visits to maximize her success with intensive lifestyle modifications for her multiple health conditions.   Objective:   Blood pressure 126/68, pulse (!) 55, temperature 98 F (36.7 C), height 5\' 4"  (1.626 m), weight 226 lb (102.5 kg), SpO2 98 %. Body mass index is 38.79 kg/m.  General: Cooperative, alert, well developed, in no acute distress. HEENT: Conjunctivae and lids unremarkable. Cardiovascular: Regular rhythm.  Lungs: Normal work of breathing. Neurologic: No focal deficits.   Lab Results  Component Value Date   CREATININE 0.89 09/10/2020   BUN 26 09/10/2020   NA 136 09/10/2020   K 3.9 09/10/2020   CL 97 09/10/2020   CO2 25 09/10/2020   Lab Results  Component Value Date   ALT 14 09/10/2020   AST 16 09/10/2020   ALKPHOS 63 09/10/2020  BILITOT 0.3 09/10/2020   Lab Results  Component Value Date   HGBA1C 5.4 09/10/2020   HGBA1C 5.8 (H) 06/05/2020   HGBA1C 5.9 (H) 02/09/2020   HGBA1C 5.8 (H) 06/07/2019   HGBA1C 5.9 (H) 01/11/2019   Lab Results  Component Value Date   INSULIN 14.3 09/10/2020   INSULIN 14.5 06/05/2020    INSULIN 17.6 02/09/2020   INSULIN 19.5 06/07/2019   INSULIN 18.5 01/11/2019   Lab Results  Component Value Date   TSH 1.480 04/12/2018   Lab Results  Component Value Date   CHOL 151 09/10/2020   HDL 63 09/10/2020   LDLCALC 73 09/10/2020   TRIG 76 09/10/2020   CHOLHDL 2.4 09/10/2020   Lab Results  Component Value Date   WBC 7.5 09/10/2020   HGB 12.1 09/10/2020   HCT 36.8 09/10/2020   MCV 90 09/10/2020   PLT 273 09/10/2020   No results found for: IRON, TIBC, FERRITIN  Obesity Behavioral Intervention:   Approximately 15 minutes were spent on the discussion below.  ASK: We discussed the diagnosis of obesity with Kathleen [name] today and Kathleen [name] agreed to give [name] permission to discuss obesity behavioral modification therapy today.  ASSESS: Kathleen [name] has the diagnosis of obesity and her BMI today is 38.77. Kathleen [name] is in the action stage of change.   ADVISE: Kathleen [name] was educated on the multiple health risks of obesity as well as the benefit of weight loss to improve her health. She was advised of the need for long term treatment and the importance of lifestyle modifications to improve her current health and to decrease her risk of future health problems.  AGREE: Multiple dietary modification options and treatment options were discussed and Kathleen [name] agreed to follow the recommendations documented in the above note.  ARRANGE: Kathleen [name] was educated on the importance of frequent visits to treat obesity as outlined per CMS and USPSTF guidelines and agreed to schedule her next follow up appointment today.  Attestation Statements:   Reviewed by clinician on day of visit: allergies, medications, problem list, medical history, surgical history, family history, social history, and previous encounter notes.   Wilhemena Durie, am acting as transcriptionist for [employer], PA-C.  I have reviewed the above documentation for accuracy and completeness, and I agree with the above. Abby Potash, PA-C

## 2020-11-19 ENCOUNTER — Other Ambulatory Visit: Payer: Self-pay | Admitting: Family Medicine

## 2020-11-19 DIAGNOSIS — Z1231 Encounter for screening mammogram for malignant neoplasm of breast: Secondary | ICD-10-CM

## 2020-12-04 DIAGNOSIS — I10 Essential (primary) hypertension: Secondary | ICD-10-CM | POA: Diagnosis not present

## 2020-12-04 DIAGNOSIS — E782 Mixed hyperlipidemia: Secondary | ICD-10-CM | POA: Diagnosis not present

## 2020-12-04 DIAGNOSIS — E559 Vitamin D deficiency, unspecified: Secondary | ICD-10-CM | POA: Diagnosis not present

## 2020-12-04 DIAGNOSIS — E1169 Type 2 diabetes mellitus with other specified complication: Secondary | ICD-10-CM | POA: Diagnosis not present

## 2020-12-06 DIAGNOSIS — E1169 Type 2 diabetes mellitus with other specified complication: Secondary | ICD-10-CM | POA: Diagnosis not present

## 2020-12-06 DIAGNOSIS — G4733 Obstructive sleep apnea (adult) (pediatric): Secondary | ICD-10-CM | POA: Diagnosis not present

## 2020-12-06 DIAGNOSIS — I10 Essential (primary) hypertension: Secondary | ICD-10-CM | POA: Diagnosis not present

## 2020-12-06 DIAGNOSIS — Z0000 Encounter for general adult medical examination without abnormal findings: Secondary | ICD-10-CM | POA: Diagnosis not present

## 2020-12-06 DIAGNOSIS — E782 Mixed hyperlipidemia: Secondary | ICD-10-CM | POA: Diagnosis not present

## 2020-12-06 DIAGNOSIS — K219 Gastro-esophageal reflux disease without esophagitis: Secondary | ICD-10-CM | POA: Diagnosis not present

## 2020-12-10 DIAGNOSIS — G4733 Obstructive sleep apnea (adult) (pediatric): Secondary | ICD-10-CM | POA: Diagnosis not present

## 2020-12-13 ENCOUNTER — Other Ambulatory Visit (INDEPENDENT_AMBULATORY_CARE_PROVIDER_SITE_OTHER): Payer: Self-pay | Admitting: Physician Assistant

## 2020-12-13 DIAGNOSIS — E1165 Type 2 diabetes mellitus with hyperglycemia: Secondary | ICD-10-CM | POA: Diagnosis not present

## 2020-12-13 DIAGNOSIS — E782 Mixed hyperlipidemia: Secondary | ICD-10-CM | POA: Diagnosis not present

## 2020-12-13 DIAGNOSIS — Z85038 Personal history of other malignant neoplasm of large intestine: Secondary | ICD-10-CM | POA: Diagnosis not present

## 2020-12-13 DIAGNOSIS — E1169 Type 2 diabetes mellitus with other specified complication: Secondary | ICD-10-CM | POA: Diagnosis not present

## 2020-12-13 DIAGNOSIS — K219 Gastro-esophageal reflux disease without esophagitis: Secondary | ICD-10-CM | POA: Diagnosis not present

## 2020-12-13 DIAGNOSIS — E785 Hyperlipidemia, unspecified: Secondary | ICD-10-CM

## 2020-12-13 DIAGNOSIS — Z853 Personal history of malignant neoplasm of breast: Secondary | ICD-10-CM | POA: Diagnosis not present

## 2020-12-13 DIAGNOSIS — I10 Essential (primary) hypertension: Secondary | ICD-10-CM | POA: Diagnosis not present

## 2020-12-13 DIAGNOSIS — H35033 Hypertensive retinopathy, bilateral: Secondary | ICD-10-CM | POA: Diagnosis not present

## 2020-12-13 NOTE — Telephone Encounter (Signed)
Last seen Kathleen [name] 

## 2020-12-24 ENCOUNTER — Ambulatory Visit (INDEPENDENT_AMBULATORY_CARE_PROVIDER_SITE_OTHER): Payer: Medicare Other | Admitting: Physician Assistant

## 2020-12-24 ENCOUNTER — Encounter (INDEPENDENT_AMBULATORY_CARE_PROVIDER_SITE_OTHER): Payer: Self-pay | Admitting: Physician Assistant

## 2020-12-24 ENCOUNTER — Other Ambulatory Visit: Payer: Self-pay

## 2020-12-24 VITALS — BP 132/54 | HR 61 | Temp 98.2°F | Ht 64.0 in | Wt 226.0 lb

## 2020-12-24 DIAGNOSIS — Z6841 Body Mass Index (BMI) 40.0 and over, adult: Secondary | ICD-10-CM

## 2020-12-24 DIAGNOSIS — E785 Hyperlipidemia, unspecified: Secondary | ICD-10-CM | POA: Diagnosis not present

## 2020-12-24 DIAGNOSIS — E1169 Type 2 diabetes mellitus with other specified complication: Secondary | ICD-10-CM

## 2020-12-24 DIAGNOSIS — E66813 Obesity, class 3: Secondary | ICD-10-CM

## 2020-12-27 ENCOUNTER — Encounter (INDEPENDENT_AMBULATORY_CARE_PROVIDER_SITE_OTHER): Payer: Self-pay | Admitting: Physician Assistant

## 2020-12-27 NOTE — Telephone Encounter (Signed)
Please review

## 2020-12-31 NOTE — Progress Notes (Signed)
Chief Complaint:   [place] is here to discuss her progress with her obesity treatment plan along with follow-up of her obesity related diagnoses. [name] is on keeping a food journal and adhering to recommended goals of 1100-1200 calories and 85 grams of protein daily and states she is following her eating plan approximately 50% of the time. [name] states she is doing 0 minutes 0 times per week.  Today's visit was #: 71 Starting weight: 259 lbs Starting date: 04/12/2018 Today's weight: 226 lbs Today's date: 12/24/2020 Total lbs lost to date: 33 Total lbs lost since last in-office visit: 0  Interim History: Beula's sister in-law recently passed away and she has not been journaling consistently. She reports that she has been emotionally and physically exhausted.   Subjective:   1. Type 2 diabetes mellitus with hyperlipidemia ([place]) Sandralee's last A1c was done on 12/04/2020 with her primary care physician, and it was 5.8. She is on metformin, and she discussed GLP-1 with her primary care physician as suggested and she is awaiting approval from her insurance. I discussed labs with the patient today.  2. Hyperlipidemia associated with type 2 diabetes mellitus ([place]) Clint's last lipid panel with her primary care physician on 12/04/2020 was within normal range. I discussed labs with the patient today.  Assessment/Plan:   1. Type 2 diabetes mellitus with hyperlipidemia [facility]) [name] will start GLP-1 once it is approved through her insurance. Good blood sugar control is important to decrease the likelihood of diabetic complications such as nephropathy, neuropathy, limb loss, blindness, coronary artery disease, and death. Intensive lifestyle modification including diet, exercise and weight loss are the first line of treatment for diabetes.   2. Hyperlipidemia associated with type 2 diabetes mellitus ([place]) Cardiovascular risk and specific lipid/LDL goals reviewed. We  discussed several lifestyle modifications today. [name] will continue her medications, and meal plan. Orders and follow up as documented in patient record.   Counseling Intensive lifestyle modifications are the first line treatment for this issue. Dietary changes: Increase soluble fiber. Decrease simple carbohydrates. Exercise changes: Moderate to vigorous-intensity aerobic activity 150 minutes per week if tolerated. Lipid-lowering medications: see documented in medical record.  3. Class 3 severe obesity with serious comorbidity and body mass index (BMI) of 40.0 to 44.9 in adult, unspecified obesity type [facility]); current bmi 43 [place] is currently in the action stage of change. As such, her goal is to continue with weight loss efforts. She has agreed to keeping a food journal and adhering to recommended goals of 1100-1200 calories and 85 grams of protein daily.   We will recheck IC at her next visit.  Exercise goals: No exercise has been prescribed at this time.  Behavioral modification strategies: meal planning and cooking strategies and keeping healthy foods in the home.  [name] has agreed to follow-up with our clinic in 4 weeks. She was informed of the importance of frequent follow-up visits to maximize her success with intensive lifestyle modifications for her multiple health conditions.   Objective:   Blood pressure (!) 132/54, pulse 61, temperature 98.2 F (36.8 C), height 5\' 4"  (1.626 m), weight 226 lb (102.5 kg), SpO2 97 %. Body mass index is 38.79 kg/m.  General: Cooperative, alert, well developed, in no acute distress. HEENT: Conjunctivae and lids unremarkable. Cardiovascular: Regular rhythm.  Lungs: Normal work of breathing. Neurologic: No focal deficits.   Lab Results  Component Value Date   CREATININE 0.89 09/10/2020   BUN 26 09/10/2020   NA 136 09/10/2020  K 3.9 09/10/2020   CL 97 09/10/2020   CO2 25 09/10/2020   Lab Results  Component Value Date   ALT 14  09/10/2020   AST 16 09/10/2020   ALKPHOS 63 09/10/2020   BILITOT 0.3 09/10/2020   Lab Results  Component Value Date   HGBA1C 5.4 09/10/2020   HGBA1C 5.8 (H) 06/05/2020   HGBA1C 5.9 (H) 02/09/2020   HGBA1C 5.8 (H) 06/07/2019   HGBA1C 5.9 (H) 01/11/2019   Lab Results  Component Value Date   INSULIN 14.3 09/10/2020   INSULIN 14.5 06/05/2020   INSULIN 17.6 02/09/2020   INSULIN 19.5 06/07/2019   INSULIN 18.5 01/11/2019   Lab Results  Component Value Date   TSH 1.480 04/12/2018   Lab Results  Component Value Date   CHOL 151 09/10/2020   HDL 63 09/10/2020   LDLCALC 73 09/10/2020   TRIG 76 09/10/2020   CHOLHDL 2.4 09/10/2020   Lab Results  Component Value Date   VD25OH 66.1 09/10/2020   VD25OH 67.0 06/05/2020   VD25OH 45.0 02/09/2020   Lab Results  Component Value Date   WBC 7.5 09/10/2020   HGB 12.1 09/10/2020   HCT 36.8 09/10/2020   MCV 90 09/10/2020   PLT 273 09/10/2020   No results found for: IRON, TIBC, FERRITIN  Obesity Behavioral Intervention:   Approximately 15 minutes were spent on the discussion below.  ASK: We discussed the diagnosis of obesity with [name] today and [name] agreed to give [name] permission to discuss obesity behavioral modification therapy today.  ASSESS: [name] has the diagnosis of obesity and her BMI today is 38.77. [name] is in the action stage of change.   ADVISE: [name] was educated on the multiple health risks of obesity as well as the benefit of weight loss to improve her health. She was advised of the need for long term treatment and the importance of lifestyle modifications to improve her current health and to decrease her risk of future health problems.  AGREE: Multiple dietary modification options and treatment options were discussed and [name] agreed to follow the recommendations documented in the above note.  ARRANGE: [name] was educated on the importance of frequent visits to treat obesity as outlined per CMS  and USPSTF guidelines and agreed to schedule her next follow up appointment today.  Attestation Statements:   Reviewed by clinician on day of visit: allergies, medications, problem list, medical history, surgical history, family history, social history, and previous encounter notes.   Wilhemena Durie, am acting as transcriptionist for [employer], PA-C.  I have reviewed the above documentation for accuracy and completeness, and I agree with the above. Abby Potash, PA-C

## 2021-01-09 DIAGNOSIS — E1169 Type 2 diabetes mellitus with other specified complication: Secondary | ICD-10-CM | POA: Diagnosis not present

## 2021-01-09 DIAGNOSIS — H35033 Hypertensive retinopathy, bilateral: Secondary | ICD-10-CM | POA: Diagnosis not present

## 2021-01-09 DIAGNOSIS — E1165 Type 2 diabetes mellitus with hyperglycemia: Secondary | ICD-10-CM | POA: Diagnosis not present

## 2021-01-09 DIAGNOSIS — I10 Essential (primary) hypertension: Secondary | ICD-10-CM | POA: Diagnosis not present

## 2021-01-09 DIAGNOSIS — Z85038 Personal history of other malignant neoplasm of large intestine: Secondary | ICD-10-CM | POA: Diagnosis not present

## 2021-01-09 DIAGNOSIS — E782 Mixed hyperlipidemia: Secondary | ICD-10-CM | POA: Diagnosis not present

## 2021-01-09 DIAGNOSIS — K219 Gastro-esophageal reflux disease without esophagitis: Secondary | ICD-10-CM | POA: Diagnosis not present

## 2021-01-09 DIAGNOSIS — Z853 Personal history of malignant neoplasm of breast: Secondary | ICD-10-CM | POA: Diagnosis not present

## 2021-01-14 ENCOUNTER — Ambulatory Visit: Payer: Medicare Other

## 2021-01-15 ENCOUNTER — Other Ambulatory Visit: Payer: Self-pay

## 2021-01-15 ENCOUNTER — Ambulatory Visit
Admission: RE | Admit: 2021-01-15 | Discharge: 2021-01-15 | Disposition: A | Discharge status: Home / Self Care | Payer: Medicare Other | Source: Ambulatory Visit | Attending: Family Medicine | Admitting: Family Medicine

## 2021-01-15 DIAGNOSIS — Z1231 Encounter for screening mammogram for malignant neoplasm of breast: Secondary | ICD-10-CM

## 2021-01-21 ENCOUNTER — Ambulatory Visit (INDEPENDENT_AMBULATORY_CARE_PROVIDER_SITE_OTHER): Payer: Medicare Other | Admitting: Physician Assistant

## 2021-01-28 ENCOUNTER — Other Ambulatory Visit: Payer: Self-pay

## 2021-01-28 ENCOUNTER — Encounter (INDEPENDENT_AMBULATORY_CARE_PROVIDER_SITE_OTHER): Payer: Self-pay | Admitting: Family Medicine

## 2021-01-28 ENCOUNTER — Ambulatory Visit (INDEPENDENT_AMBULATORY_CARE_PROVIDER_SITE_OTHER): Payer: Medicare Other | Admitting: Family Medicine

## 2021-01-28 VITALS — BP 130/72 | HR 57 | Temp 98.0°F | Ht 64.0 in | Wt 230.0 lb

## 2021-01-28 DIAGNOSIS — Z6841 Body Mass Index (BMI) 40.0 and over, adult: Secondary | ICD-10-CM

## 2021-01-28 DIAGNOSIS — E119 Type 2 diabetes mellitus without complications: Secondary | ICD-10-CM | POA: Diagnosis not present

## 2021-01-29 DIAGNOSIS — E119 Type 2 diabetes mellitus without complications: Secondary | ICD-10-CM | POA: Insufficient documentation

## 2021-01-29 DIAGNOSIS — Z6841 Body Mass Index (BMI) 40.0 and over, adult: Secondary | ICD-10-CM | POA: Insufficient documentation

## 2021-01-29 NOTE — Progress Notes (Signed)
Chief Complaint:   [place] is here to discuss her progress with her obesity treatment plan along with follow-up of her obesity related diagnoses. Kathleen [name] is on keeping a food journal and adhering to recommended goals of 1100-1200 calories and 85 grams of protein and states she is following her eating plan approximately 50% of the time. Kathleen [name] states she is doing 0 minutes 0 times per week.  Today's visit was #: 23 Starting weight: 259 lbs Starting date: 04/12/2018 Today's weight: 230 lbs Today's date: 01/28/2021 Total lbs lost to date: 29 lbs Total lbs lost since last in-office visit: 0  Interim History: Kathleen [name] has recovered from Covid and says she did not have a severe case.  She notes she has been more hungry overall this summer. She believes this is related to some unusual circumstances this summer: having Covid, broken toe and death in family. She has exceeded her calorie goals several days. She consistently meets her protein goals.  Subjective:   1. Type 2 diabetes mellitus without complication, without long-term current use of insulin [facility]) Kathleen [name] will be starting Ozempic this week. Her last A1C level was 5.4. She is on Metformin 500 every day. Her CBGs run 97-125.  Lab Results  Component Value Date   HGBA1C 5.4 09/10/2020   HGBA1C 5.8 (H) 06/05/2020   HGBA1C 5.9 (H) 02/09/2020   Lab Results  Component Value Date   LDLCALC 73 09/10/2020   CREATININE 0.89 09/10/2020   Lab Results  Component Value Date   INSULIN 14.3 09/10/2020   INSULIN 14.5 06/05/2020   INSULIN 17.6 02/09/2020   INSULIN 19.5 06/07/2019   INSULIN 18.5 01/11/2019     Assessment/Plan:   1. Type 2 diabetes mellitus without complication, without long-term current use of insulin ([place]) Kathleen [name] agrees to start Ozempic.She will continue Metformin.   2. Obesity: Current BMI 39.46 Kathleen [name] is currently in the action stage of change. As such, her goal is to continue with weight loss  efforts. She has agreed to keeping a food journal and adhering to recommended goals of 1100-1200 calories and 85 grams of protein.   Exercise goals:  Kathleen [name] will get back to exercising.  Behavioral modification strategies: meal planning and cooking strategies and keeping a strict food journal.  Kathleen [name] has agreed to follow-up with our clinic in 3-4 weeks.  Objective:   Blood pressure 130/72, pulse (!) 57, temperature 98 F (36.7 C), height '5\' 4"'$  (1.626 m), weight 230 lb (104.3 kg), SpO2 98 %. Body mass index is 39.48 kg/m.  General: Cooperative, alert, well developed, in no acute distress. HEENT: Conjunctivae and lids unremarkable. Cardiovascular: Regular rhythm.  Lungs: Normal work of breathing. Neurologic: No focal deficits.   Lab Results  Component Value Date   CREATININE 0.89 09/10/2020   BUN 26 09/10/2020   NA 136 09/10/2020   K 3.9 09/10/2020   CL 97 09/10/2020   CO2 25 09/10/2020   Lab Results  Component Value Date   ALT 14 09/10/2020   AST 16 09/10/2020   ALKPHOS 63 09/10/2020   BILITOT 0.3 09/10/2020   Lab Results  Component Value Date   HGBA1C 5.4 09/10/2020   HGBA1C 5.8 (H) 06/05/2020   HGBA1C 5.9 (H) 02/09/2020   HGBA1C 5.8 (H) 06/07/2019   HGBA1C 5.9 (H) 01/11/2019   Lab Results  Component Value Date   INSULIN 14.3 09/10/2020   INSULIN 14.5 06/05/2020   INSULIN 17.6 02/09/2020   INSULIN 19.5 06/07/2019   INSULIN 18.5 01/11/2019  Lab Results  Component Value Date   TSH 1.480 04/12/2018   Lab Results  Component Value Date   CHOL 151 09/10/2020   HDL 63 09/10/2020   LDLCALC 73 09/10/2020   TRIG 76 09/10/2020   CHOLHDL 2.4 09/10/2020   Lab Results  Component Value Date   VD25OH 66.1 09/10/2020   VD25OH 67.0 06/05/2020   VD25OH 45.0 02/09/2020   Lab Results  Component Value Date   WBC 7.5 09/10/2020   HGB 12.1 09/10/2020   HCT 36.8 09/10/2020   MCV 90 09/10/2020   PLT 273 09/10/2020   No results found for: IRON, TIBC,  FERRITIN  Obesity Behavioral Intervention:   Approximately 15 minutes were spent on the discussion below.  ASK: We discussed the diagnosis of obesity with Kathleen [name] today and Kathleen [name] agreed to give [name] permission to discuss obesity behavioral modification therapy today.  ASSESS: Kathleen [name] has the diagnosis of obesity and her BMI today is 39.6. Kathleen [name] is in the action stage of change.   ADVISE: Kathleen [name] was educated on the multiple health risks of obesity as well as the benefit of weight loss to improve her health. She was advised of the need for long term treatment and the importance of lifestyle modifications to improve her current health and to decrease her risk of future health problems.  AGREE: Multiple dietary modification options and treatment options were discussed and Kathleen [name] agreed to follow the recommendations documented in the above note.  ARRANGE: Kathleen [name] was educated on the importance of frequent visits to treat obesity as outlined per CMS and USPSTF guidelines and agreed to schedule her next follow up appointment today.  Attestation Statements:   Reviewed by clinician on day of visit: allergies, medications, problem list, medical history, surgical history, family history, social history, and previous encounter notes.  I, Lizbeth Bark, RMA, am acting as Location manager for [employer], [place].   I have reviewed the above documentation for accuracy and completeness, and I agree with the above. -  Georgianne Fick, FNP

## 2021-02-07 DIAGNOSIS — K219 Gastro-esophageal reflux disease without esophagitis: Secondary | ICD-10-CM | POA: Diagnosis not present

## 2021-02-07 DIAGNOSIS — I10 Essential (primary) hypertension: Secondary | ICD-10-CM | POA: Diagnosis not present

## 2021-02-07 DIAGNOSIS — Z85038 Personal history of other malignant neoplasm of large intestine: Secondary | ICD-10-CM | POA: Diagnosis not present

## 2021-02-07 DIAGNOSIS — E1169 Type 2 diabetes mellitus with other specified complication: Secondary | ICD-10-CM | POA: Diagnosis not present

## 2021-02-07 DIAGNOSIS — E782 Mixed hyperlipidemia: Secondary | ICD-10-CM | POA: Diagnosis not present

## 2021-02-07 DIAGNOSIS — Z853 Personal history of malignant neoplasm of breast: Secondary | ICD-10-CM | POA: Diagnosis not present

## 2021-02-07 DIAGNOSIS — E1165 Type 2 diabetes mellitus with hyperglycemia: Secondary | ICD-10-CM | POA: Diagnosis not present

## 2021-02-07 DIAGNOSIS — H35033 Hypertensive retinopathy, bilateral: Secondary | ICD-10-CM | POA: Diagnosis not present

## 2021-02-20 ENCOUNTER — Other Ambulatory Visit: Payer: Self-pay

## 2021-02-20 ENCOUNTER — Encounter (INDEPENDENT_AMBULATORY_CARE_PROVIDER_SITE_OTHER): Payer: Self-pay | Admitting: Family Medicine

## 2021-02-20 ENCOUNTER — Ambulatory Visit (INDEPENDENT_AMBULATORY_CARE_PROVIDER_SITE_OTHER): Payer: Medicare Other | Admitting: Family Medicine

## 2021-02-20 VITALS — BP 139/80 | HR 66 | Temp 98.0°F | Ht 64.0 in | Wt 227.0 lb

## 2021-02-20 DIAGNOSIS — E119 Type 2 diabetes mellitus without complications: Secondary | ICD-10-CM

## 2021-02-20 DIAGNOSIS — Z6841 Body Mass Index (BMI) 40.0 and over, adult: Secondary | ICD-10-CM

## 2021-02-20 NOTE — Progress Notes (Signed)
Chief Complaint:   Kathleen [name] is here to discuss her progress with her obesity treatment plan along with follow-up of her obesity related diagnoses. Kathleen [name] is on keeping a food journal and adhering to recommended goals of 1100-1200 calories and 85 grams of  protein and states she is following her eating plan approximately 85% of the time. Kathleen [name] states she is doing a stationary bike for 20 minutes 4 times per week.  Today's visit was #: 62 Starting weight: 259 lbs Starting date: 04/12/2018 Today's weight: 227 lbs Today's date: 02/20/2021 Total lbs lost to date: 32 lbs Total lbs lost since last in-office visit: 3 lbs  Interim History: Kathleen [name] recently went on a trip to [facility].  Her appetite is well controlled with Ozempic. She is journaling consistently and mostly meeting protein goals. She occasionally goes over on calories.  Subjective:   1. Type 2 diabetes mellitus without complication, without long-term current use of insulin ([facility]) Kathleen [name]'s diabetes mellitus is well controlled. Her lat A1C was 5.4. Her FBG was 94 - 125. She started Ozempic and is taking 0.5 mg weekly. She notes occasional diarrhea and mild nausea. Ozempic is working well for appetite suppression.  Lab Results  Component Value Date   HGBA1C 5.4 09/10/2020   HGBA1C 5.8 (H) 06/05/2020   HGBA1C 5.9 (H) 02/09/2020   Lab Results  Component Value Date   LDLCALC 73 09/10/2020   CREATININE 0.89 09/10/2020   Lab Results  Component Value Date   INSULIN 14.3 09/10/2020   INSULIN 14.5 06/05/2020   INSULIN 17.6 02/09/2020   INSULIN 19.5 06/07/2019   INSULIN 18.5 01/11/2019    Assessment/Plan:   1. Type 2 diabetes mellitus without complication, without long-term current use of insulin ([place]) Kathleen [name] will discontinue Metformin and Ozempic 0.5 mg weekly.   2. Obesity: Current BMI 38.95 Kathleen [name] is currently in the action stage of change. As such, her goal is to continue with weight loss efforts.  She has agreed to keeping a food journal and adhering to recommended goals of 1100-1200 calories and 85 grams of protein daily.  Exercise goals:  Kathleen [name] will add hand weights 2 times per week to her exercise regimen.  Behavioral modification strategies: increasing lean protein intake.  Kathleen [name] has agreed to follow-up with our clinic in 3 weeks with Kathleen [name], PAC.  Objective:   Blood pressure 139/80, pulse 66, temperature 98 F (36.7 C), height '5\' 4"'$  (1.626 m), weight 227 lb (103 kg), SpO2 99 %. Body mass index is 38.96 kg/m.  General: Cooperative, alert, well developed, in no acute distress. HEENT: Conjunctivae and lids unremarkable. Cardiovascular: Regular rhythm.  Lungs: Normal work of breathing. Neurologic: No focal deficits.   Lab Results  Component Value Date   CREATININE 0.89 09/10/2020   BUN 26 09/10/2020   NA 136 09/10/2020   K 3.9 09/10/2020   CL 97 09/10/2020   CO2 25 09/10/2020   Lab Results  Component Value Date   ALT 14 09/10/2020   AST 16 09/10/2020   ALKPHOS 63 09/10/2020   BILITOT 0.3 09/10/2020   Lab Results  Component Value Date   HGBA1C 5.4 09/10/2020   HGBA1C 5.8 (H) 06/05/2020   HGBA1C 5.9 (H) 02/09/2020   HGBA1C 5.8 (H) 06/07/2019   HGBA1C 5.9 (H) 01/11/2019   Lab Results  Component Value Date   INSULIN 14.3 09/10/2020   INSULIN 14.5 06/05/2020   INSULIN 17.6 02/09/2020   INSULIN 19.5 06/07/2019   INSULIN 18.5 01/11/2019  Lab Results  Component Value Date   TSH 1.480 04/12/2018   Lab Results  Component Value Date   CHOL 151 09/10/2020   HDL 63 09/10/2020   LDLCALC 73 09/10/2020   TRIG 76 09/10/2020   CHOLHDL 2.4 09/10/2020   Lab Results  Component Value Date   VD25OH 66.1 09/10/2020   VD25OH 67.0 06/05/2020   VD25OH 45.0 02/09/2020   Lab Results  Component Value Date   WBC 7.5 09/10/2020   HGB 12.1 09/10/2020   HCT 36.8 09/10/2020   MCV 90 09/10/2020   PLT 273 09/10/2020   No results found for: IRON, TIBC,  FERRITIN  Obesity Behavioral Intervention:   Approximately 15 minutes were spent on the discussion below.  ASK: We discussed the diagnosis of obesity with Kathleen [name] today and Kathleen [name] agreed to give [name] permission to discuss obesity behavioral modification therapy today.  ASSESS: Kathleen [name] has the diagnosis of obesity and her BMI today is 39.1. Kathleen [name] is in the action stage of change.   ADVISE: Kathleen [name] was educated on the multiple health risks of obesity as well as the benefit of weight loss to improve her health. She was advised of the need for long term treatment and the importance of lifestyle modifications to improve her current health and to decrease her risk of future health problems.  AGREE: Multiple dietary modification options and treatment options were discussed and Kathleen [name] agreed to follow the recommendations documented in the above note.  ARRANGE: Kathleen [name] was educated on the importance of frequent visits to treat obesity as outlined per CMS and USPSTF guidelines and agreed to schedule her next follow up appointment today.  Attestation Statements:   Reviewed by clinician on day of visit: allergies, medications, problem list, medical history, surgical history, family history, social history, and previous encounter notes.  I, Lizbeth Bark, RMA, am acting as Location manager for [employer], [place].   I have reviewed the above documentation for accuracy and completeness, and I agree with the above. -  Georgianne Fick, FNP

## 2021-03-11 ENCOUNTER — Ambulatory Visit (INDEPENDENT_AMBULATORY_CARE_PROVIDER_SITE_OTHER): Payer: Medicare Other | Admitting: Physician Assistant

## 2021-03-11 ENCOUNTER — Encounter (INDEPENDENT_AMBULATORY_CARE_PROVIDER_SITE_OTHER): Payer: Self-pay | Admitting: Physician Assistant

## 2021-03-11 ENCOUNTER — Other Ambulatory Visit: Payer: Self-pay

## 2021-03-11 VITALS — BP 139/77 | HR 62 | Temp 97.8°F | Ht 64.0 in | Wt 224.0 lb

## 2021-03-11 DIAGNOSIS — E559 Vitamin D deficiency, unspecified: Secondary | ICD-10-CM | POA: Diagnosis not present

## 2021-03-11 DIAGNOSIS — E785 Hyperlipidemia, unspecified: Secondary | ICD-10-CM | POA: Diagnosis not present

## 2021-03-11 DIAGNOSIS — E1169 Type 2 diabetes mellitus with other specified complication: Secondary | ICD-10-CM

## 2021-03-11 DIAGNOSIS — Z6841 Body Mass Index (BMI) 40.0 and over, adult: Secondary | ICD-10-CM | POA: Diagnosis not present

## 2021-03-11 DIAGNOSIS — E119 Type 2 diabetes mellitus without complications: Secondary | ICD-10-CM

## 2021-03-11 NOTE — Progress Notes (Signed)
Chief Complaint:   [place] is here to discuss her progress with her obesity treatment plan along with follow-up of her obesity related diagnoses. Kathleen [name] is on keeping a food journal and adhering to recommended goals of 1100-1200 calories and 85 grams of protein daily and states she is following her eating plan approximately 75% of the time. Kathleen [name] states she is bike riding for 15 minutes 3 times per week.  Today's visit was #: 12 Starting weight: 259 lbs Starting date: 04/12/2018 Today's weight: 224 lbs Today's date: 03/11/2021 Total lbs lost to date: 35 Total lbs lost since last in-office visit: 3  Interim History: Kathleen [name] did well with weight loss. She is not journaling consistently. Her appetite has decreased since starting Ozempic and she is not meeting her calories and protein goals daily.  Subjective:   1. Type 2 diabetes mellitus without complication, without long-term current use of insulin ([place]) Kathleen [name] is on Ozempic 0.5 mg weekly, and she is tolerating it well. Her blood sugars range between 67 and 122. She noted one low when she missed dinner. Her last A1c was 5.4.  2. Vitamin D deficiency Kathleen [name] is on Vit D weekly, and she denies nausea, vomiting, or muscle weakness.  3. Hyperlipidemia associated with type 2 diabetes mellitus [facility]) Kathleen [name] is on Zocor, and she is tolerating it well.  Assessment/Plan:   1. Type 2 diabetes mellitus without complication, without long-term current use of insulin [facility]) Kathleen [name]  will continue with Ozempic and weight loss. We will check labs today. Good blood sugar control is important to decrease the likelihood of diabetic complications such as nephropathy, neuropathy, limb loss, blindness, coronary artery disease, and death. Intensive lifestyle modification including diet, exercise and weight loss are the first line of treatment for diabetes.   - Comprehensive metabolic panel - Hemoglobin A1c - Insulin, random  2.  Vitamin D deficiency Low Vitamin D level contributes to fatigue and are associated with obesity, breast, and colon cancer. We will check labs today. Kathleen [name] will continue prescription Vitamin D 50,000 IU every week and will follow-up for routine testing of Vitamin D, at least 2-3 times per year to avoid over-replacement.  - VITAMIN D 25 Hydroxy (Vit-D Deficiency, Fractures)  3. Hyperlipidemia associated with type 2 diabetes mellitus ([place]) Cardiovascular risk and specific lipid/LDL goals reviewed.  We discussed several lifestyle modifications today. We will check labs today. Kathleen [name] will continue her medications, and will continue to work on diet, exercise and weight loss efforts. Orders and follow up as documented in patient record.   Counseling Intensive lifestyle modifications are the first line treatment for this issue. Dietary changes: Increase soluble fiber. Decrease simple carbohydrates. Exercise changes: Moderate to vigorous-intensity aerobic activity 150 minutes per week if tolerated. Lipid-lowering medications: see documented in medical record.  - Lipid panel  4. Obesity: Current BMI 38.43 Kathleen [name] is currently in the action stage of change. As such, her goal is to continue with weight loss efforts. She has agreed to keeping a food journal and adhering to recommended goals of 1100 calories and 80 grams of protein daily.   Exercise goals: As is.  Behavioral modification strategies: increasing lean protein intake and no skipping meals.  Kathleen [name] has agreed to follow-up with our clinic in 3 weeks. She was informed of the importance of frequent follow-up visits to maximize her success with intensive lifestyle modifications for her multiple health conditions.   Kathleen [name] was informed we would discuss her lab results at her next visit unless  there is a critical issue that needs to be addressed sooner. Kathleen [name] agreed to keep her next visit at the agreed upon time to discuss these  results.  Objective:   Blood pressure 139/77, pulse 62, temperature 97.8 F (36.6 C), height 5\' 4"  (1.626 m), weight 224 lb (101.6 kg), SpO2 97 %. Body mass index is 38.45 kg/m.  General: Cooperative, alert, well developed, in no acute distress. HEENT: Conjunctivae and lids unremarkable. Cardiovascular: Regular rhythm.  Lungs: Normal work of breathing. Neurologic: No focal deficits.   Lab Results  Component Value Date   CREATININE 0.89 09/10/2020   BUN 26 09/10/2020   NA 136 09/10/2020   K 3.9 09/10/2020   CL 97 09/10/2020   CO2 25 09/10/2020   Lab Results  Component Value Date   ALT 14 09/10/2020   AST 16 09/10/2020   ALKPHOS 63 09/10/2020   BILITOT 0.3 09/10/2020   Lab Results  Component Value Date   HGBA1C 5.4 09/10/2020   HGBA1C 5.8 (H) 06/05/2020   HGBA1C 5.9 (H) 02/09/2020   HGBA1C 5.8 (H) 06/07/2019   HGBA1C 5.9 (H) 01/11/2019   Lab Results  Component Value Date   INSULIN 14.3 09/10/2020   INSULIN 14.5 06/05/2020   INSULIN 17.6 02/09/2020   INSULIN 19.5 06/07/2019   INSULIN 18.5 01/11/2019   Lab Results  Component Value Date   TSH 1.480 04/12/2018   Lab Results  Component Value Date   CHOL 151 09/10/2020   HDL 63 09/10/2020   LDLCALC 73 09/10/2020   TRIG 76 09/10/2020   CHOLHDL 2.4 09/10/2020   Lab Results  Component Value Date   VD25OH 66.1 09/10/2020   VD25OH 67.0 06/05/2020   VD25OH 45.0 02/09/2020   Lab Results  Component Value Date   WBC 7.5 09/10/2020   HGB 12.1 09/10/2020   HCT 36.8 09/10/2020   MCV 90 09/10/2020   PLT 273 09/10/2020   No results found for: IRON, TIBC, FERRITIN  Obesity Behavioral Intervention:   Approximately 15 minutes were spent on the discussion below.  ASK: We discussed the diagnosis of obesity with Kathleen [name] today and Kathleen [name] agreed to give [name] permission to discuss obesity behavioral modification therapy today.  ASSESS: Kathleen [name] has the diagnosis of obesity and her BMI today is 38.6. Kathleen [name] is in  the action stage of change.   ADVISE: Kathleen [name] was educated on the multiple health risks of obesity as well as the benefit of weight loss to improve her health. She was advised of the need for long term treatment and the importance of lifestyle modifications to improve her current health and to decrease her risk of future health problems.  AGREE: Multiple dietary modification options and treatment options were discussed and Kathleen [name] agreed to follow the recommendations documented in the above note.  ARRANGE: Kathleen [name] was educated on the importance of frequent visits to treat obesity as outlined per CMS and USPSTF guidelines and agreed to schedule her next follow up appointment today.  Attestation Statements:   Reviewed by clinician on day of visit: allergies, medications, problem list, medical history, surgical history, family history, social history, and previous encounter notes.   Wilhemena Durie, am acting as transcriptionist for [employer], PA-C.  I have reviewed the above documentation for accuracy and completeness, and I agree with the above. Abby Potash, PA-C

## 2021-03-12 LAB — COMPREHENSIVE METABOLIC PANEL
ALT: 14 IU/L (ref 0–32)
AST: 16 IU/L (ref 0–40)
Albumin/Globulin Ratio: 1.5 (ref 1.2–2.2)
Albumin: 4.7 g/dL (ref 3.7–4.7)
Alkaline Phosphatase: 72 IU/L (ref 44–121)
BUN/Creatinine Ratio: 36 — ABNORMAL HIGH (ref 12–28)
BUN: 27 mg/dL (ref 8–27)
Bilirubin Total: 0.4 mg/dL (ref 0.0–1.2)
CO2: 24 mmol/L (ref 20–29)
Calcium: 9.8 mg/dL (ref 8.7–10.3)
Chloride: 99 mmol/L (ref 96–106)
Creatinine, Ser: 0.76 mg/dL (ref 0.57–1.00)
Globulin, Total: 3.1 g/dL (ref 1.5–4.5)
Glucose: 89 mg/dL (ref 70–99)
Potassium: 4.1 mmol/L (ref 3.5–5.2)
Sodium: 142 mmol/L (ref 134–144)
Total Protein: 7.8 g/dL (ref 6.0–8.5)
eGFR: 80 mL/min/{1.73_m2} (ref >59)

## 2021-03-12 LAB — LIPID PANEL
Chol/HDL Ratio: 2.5 ratio (ref 0.0–4.4)
Cholesterol, Total: 163 mg/dL (ref 100–199)
HDL: 65 mg/dL (ref >39)
LDL Chol Calc (NIH): 82 mg/dL (ref 0–99)
Triglycerides: 84 mg/dL (ref 0–149)
VLDL Cholesterol Cal: 16 mg/dL (ref 5–40)

## 2021-03-12 LAB — INSULIN, RANDOM: INSULIN: 18.4 u[IU]/mL (ref 2.6–24.9)

## 2021-03-12 LAB — HEMOGLOBIN A1C
Est. average glucose Bld gHb Est-mCnc: 117 mg/dL
Hgb A1c MFr Bld: 5.7 % — ABNORMAL HIGH (ref 4.8–5.6)

## 2021-03-12 LAB — VITAMIN D 25 HYDROXY (VIT D DEFICIENCY, FRACTURES): Vit D, 25-Hydroxy: 75.1 ng/mL (ref 30.0–100.0)

## 2021-04-01 ENCOUNTER — Encounter (INDEPENDENT_AMBULATORY_CARE_PROVIDER_SITE_OTHER): Payer: Self-pay | Admitting: Physician Assistant

## 2021-04-01 ENCOUNTER — Ambulatory Visit (INDEPENDENT_AMBULATORY_CARE_PROVIDER_SITE_OTHER): Payer: Medicare Other | Admitting: Physician Assistant

## 2021-04-01 ENCOUNTER — Other Ambulatory Visit: Payer: Self-pay

## 2021-04-01 VITALS — BP 117/75 | HR 65 | Temp 98.1°F | Ht 64.0 in | Wt 227.0 lb

## 2021-04-01 DIAGNOSIS — E785 Hyperlipidemia, unspecified: Secondary | ICD-10-CM | POA: Diagnosis not present

## 2021-04-01 DIAGNOSIS — Z6841 Body Mass Index (BMI) 40.0 and over, adult: Secondary | ICD-10-CM

## 2021-04-01 DIAGNOSIS — E66813 Obesity, class 3: Secondary | ICD-10-CM

## 2021-04-01 DIAGNOSIS — E119 Type 2 diabetes mellitus without complications: Secondary | ICD-10-CM | POA: Diagnosis not present

## 2021-04-01 DIAGNOSIS — E1169 Type 2 diabetes mellitus with other specified complication: Secondary | ICD-10-CM

## 2021-04-01 NOTE — Progress Notes (Signed)
Chief Complaint:   [name] is here to discuss her progress with her obesity treatment plan along with follow-up of her obesity related diagnoses. Kathleen [name] is on keeping a food journal and adhering to recommended goals of 1100-1200 calories and 85 grams protein and states she is following her eating plan approximately 75% of the time. Kathleen [name] states she is doing stationary bike 15 minutes 2-3 times per week.  Today's visit was #: 56 Starting weight: 259 lbs Starting date: 04/12/2018 Today's weight: 227 lbs Today's date: 04/01/2021 Total lbs lost to date: 32 Total lbs lost since last in-office visit: 0  Interim History: Kathleen [name] reports that her birthday was yesterday. She was also at the beach for a week. She has not been journaling consistently.  Subjective:   1. Hyperlipidemia associated with type 2 diabetes mellitus ([place]) Pt's last lipid panel was at goal on 03/11/2021. She is on simvastatin and tolerating it well.   2. Type 2 diabetes mellitus without complication, without long-term current use of insulin ([facility]) [name]'s fasting blood sugars run 98-130. Her last A1c was 5.7. She is on Ozempic 0.25 mg. Pt reports diarrhea after eating fried foods.  Assessment/Plan:   1. Hyperlipidemia associated with type 2 diabetes mellitus ([place]) Cardiovascular risk and specific lipid/LDL goals reviewed.  We discussed several lifestyle modifications today and Kathleen [name] will continue to work on diet, exercise and weight loss efforts. Orders and follow up as documented in patient record. Continue current treatment plan.  Counseling Intensive lifestyle modifications are the first line treatment for this issue. Dietary changes: Increase soluble fiber. Decrease simple carbohydrates. Exercise changes: Moderate to vigorous-intensity aerobic activity 150 minutes per week if tolerated. Lipid-lowering medications: see documented in medical record.  2. Type 2 diabetes mellitus without  complication, without long-term current use of insulin ([facility]) Good blood sugar control is important to decrease the likelihood of diabetic complications such as nephropathy, neuropathy, limb loss, blindness, coronary artery disease, and death. Intensive lifestyle modification including diet, exercise and weight loss are the first line of treatment for diabetes. Continue with Ozempic and stop eating fried foods.  3. Obesity: Current BMI 38.95  Kathleen [name] is currently in the action stage of change. As such, her goal is to continue with weight loss efforts. She has agreed to change to the Category 2 Plan.   Exercise goals:  As is  Behavioral modification strategies: increasing lean protein intake and meal planning and cooking strategies.  Kathleen [name] has agreed to follow-up with our clinic in 3 weeks. She was informed of the importance of frequent follow-up visits to maximize her success with intensive lifestyle modifications for her multiple health conditions.   Objective:   Blood pressure 117/75, pulse 65, temperature 98.1 F (36.7 C), height 5\' 4"  (1.626 m), weight 227 lb (103 kg), SpO2 97 %. Body mass index is 38.96 kg/m.  General: Cooperative, alert, well developed, in no acute distress. HEENT: Conjunctivae and lids unremarkable. Cardiovascular: Regular rhythm.  Lungs: Normal work of breathing. Neurologic: No focal deficits.   Lab Results  Component Value Date   CREATININE 0.76 03/11/2021   BUN 27 03/11/2021   NA 142 03/11/2021   K 4.1 03/11/2021   CL 99 03/11/2021   CO2 24 03/11/2021   Lab Results  Component Value Date   ALT 14 03/11/2021   AST 16 03/11/2021   ALKPHOS 72 03/11/2021   BILITOT 0.4 03/11/2021   Lab Results  Component Value Date   HGBA1C 5.7 (H) 03/11/2021   HGBA1C  5.4 09/10/2020   HGBA1C 5.8 (H) 06/05/2020   HGBA1C 5.9 (H) 02/09/2020   HGBA1C 5.8 (H) 06/07/2019   Lab Results  Component Value Date   INSULIN 18.4 03/11/2021   INSULIN 14.3 09/10/2020    INSULIN 14.5 06/05/2020   INSULIN 17.6 02/09/2020   INSULIN 19.5 06/07/2019   Lab Results  Component Value Date   TSH 1.480 04/12/2018   Lab Results  Component Value Date   CHOL 163 03/11/2021   HDL 65 03/11/2021   LDLCALC 82 03/11/2021   TRIG 84 03/11/2021   CHOLHDL 2.5 03/11/2021   Lab Results  Component Value Date   VD25OH 75.1 03/11/2021   VD25OH 66.1 09/10/2020   VD25OH 67.0 06/05/2020   Lab Results  Component Value Date   WBC 7.5 09/10/2020   HGB 12.1 09/10/2020   HCT 36.8 09/10/2020   MCV 90 09/10/2020   PLT 273 09/10/2020   No results found for: IRON, TIBC, FERRITIN  Obesity Behavioral Intervention:   Approximately 15 minutes were spent on the discussion below.  ASK: We discussed the diagnosis of obesity with Kathleen [name] today and Kathleen [name] agreed to give [name] permission to discuss obesity behavioral modification therapy today.  ASSESS: [name] has the diagnosis of obesity and her BMI today is 39.1. Kathleen [name] is in the action stage of change.   ADVISE: [name] was educated on the multiple health risks of obesity as well as the benefit of weight loss to improve her health. She was advised of the need for long term treatment and the importance of lifestyle modifications to improve her current health and to decrease her risk of future health problems.  AGREE: Multiple dietary modification options and treatment options were discussed and Kathleen [name] agreed to follow the recommendations documented in the above note.  ARRANGE: Kathleen [name] was educated on the importance of frequent visits to treat obesity as outlined per CMS and USPSTF guidelines and agreed to schedule her next follow up appointment today.  Attestation Statements:   Reviewed by clinician on day of visit: allergies, medications, problem list, medical history, surgical history, family history, social history, and previous encounter notes.  Coral Ceo, CMA, am acting as transcriptionist for [employer], PA-C.  I have reviewed the above documentation for accuracy and completeness, and I agree with the above. Abby Potash, PA-C

## 2021-04-03 DIAGNOSIS — Z85038 Personal history of other malignant neoplasm of large intestine: Secondary | ICD-10-CM | POA: Diagnosis not present

## 2021-04-03 DIAGNOSIS — H35033 Hypertensive retinopathy, bilateral: Secondary | ICD-10-CM | POA: Diagnosis not present

## 2021-04-03 DIAGNOSIS — E782 Mixed hyperlipidemia: Secondary | ICD-10-CM | POA: Diagnosis not present

## 2021-04-03 DIAGNOSIS — K219 Gastro-esophageal reflux disease without esophagitis: Secondary | ICD-10-CM | POA: Diagnosis not present

## 2021-04-03 DIAGNOSIS — E1169 Type 2 diabetes mellitus with other specified complication: Secondary | ICD-10-CM | POA: Diagnosis not present

## 2021-04-03 DIAGNOSIS — I10 Essential (primary) hypertension: Secondary | ICD-10-CM | POA: Diagnosis not present

## 2021-04-03 DIAGNOSIS — E1165 Type 2 diabetes mellitus with hyperglycemia: Secondary | ICD-10-CM | POA: Diagnosis not present

## 2021-04-03 DIAGNOSIS — Z853 Personal history of malignant neoplasm of breast: Secondary | ICD-10-CM | POA: Diagnosis not present

## 2021-04-18 DIAGNOSIS — R350 Frequency of micturition: Secondary | ICD-10-CM | POA: Diagnosis not present

## 2021-04-18 DIAGNOSIS — N3000 Acute cystitis without hematuria: Secondary | ICD-10-CM | POA: Diagnosis not present

## 2021-04-22 ENCOUNTER — Other Ambulatory Visit: Payer: Self-pay

## 2021-04-22 ENCOUNTER — Ambulatory Visit (INDEPENDENT_AMBULATORY_CARE_PROVIDER_SITE_OTHER): Payer: Medicare Other | Admitting: Physician Assistant

## 2021-04-22 ENCOUNTER — Encounter (INDEPENDENT_AMBULATORY_CARE_PROVIDER_SITE_OTHER): Payer: Self-pay | Admitting: Physician Assistant

## 2021-04-22 VITALS — BP 139/75 | HR 60 | Temp 98.0°F | Ht 64.0 in | Wt 228.0 lb

## 2021-04-22 DIAGNOSIS — E119 Type 2 diabetes mellitus without complications: Secondary | ICD-10-CM

## 2021-04-22 DIAGNOSIS — Z6841 Body Mass Index (BMI) 40.0 and over, adult: Secondary | ICD-10-CM

## 2021-04-22 NOTE — Progress Notes (Signed)
Chief Complaint:   [place] is here to discuss her progress with her obesity treatment plan along with follow-up of her obesity related diagnoses. Kathleen [name] is on the Category 2 Plan and states she is following her eating plan approximately 50-60% of the time. Kathleen [name] states she is bike riding 15 minutes 3-4 times per week.  Today's visit was #: 27 Starting weight: 259 lbs Starting date: 04/12/2018 Today's weight: 228 lbs Today's date: 04/22/2021 Total lbs lost to date: 31 Total lbs lost since last in-office visit: 0  Interim History: Kathleen [name] reports that she ate a lot of of Halloween candy on some days recently. She continues to have diarrhea once a week and will see GI this week. She is missing 2-3 oz of protein daily. She is traveling to the beach next week.  Subjective:   1. Type 2 diabetes mellitus without complication, without long-term current use of insulin ([place]) Kathleen [name] is on Ozempic 0.25 mg. She feels hungry but no food sounds appetizing. Her last A1c was 5.7.  Assessment/Plan:   1. Type 2 diabetes mellitus without complication, without long-term current use of insulin ([facility]) Good blood sugar control is important to decrease the likelihood of diabetic complications such as nephropathy, neuropathy, limb loss, blindness, coronary artery disease, and death. Intensive lifestyle modification including diet, exercise and weight loss are the first line of treatment for diabetes. Continue with meds and weight loss.  2. Obesity: Current BMI 39.12  Kathleen [name] is currently in the action stage of change. As such, her goal is to continue with weight loss efforts. She has agreed to the Category 2 Plan.   Exercise goals:  As is  Behavioral modification strategies: meal planning and cooking strategies and keeping healthy foods in the home.  Kathleen [name] has agreed to follow-up with our clinic in 4 weeks. She was informed of the importance of frequent follow-up visits to maximize  her success with intensive lifestyle modifications for her multiple health conditions.   Objective:   Blood pressure 139/75, pulse 60, temperature 98 F (36.7 C), height 5\' 4"  (1.626 m), weight 228 lb (103.4 kg), SpO2 97 %. Body mass index is 39.14 kg/m.  General: Cooperative, alert, well developed, in no acute distress. HEENT: Conjunctivae and lids unremarkable. Cardiovascular: Regular rhythm.  Lungs: Normal work of breathing. Neurologic: No focal deficits.   Lab Results  Component Value Date   CREATININE 0.76 03/11/2021   BUN 27 03/11/2021   NA 142 03/11/2021   K 4.1 03/11/2021   CL 99 03/11/2021   CO2 24 03/11/2021   Lab Results  Component Value Date   ALT 14 03/11/2021   AST 16 03/11/2021   ALKPHOS 72 03/11/2021   BILITOT 0.4 03/11/2021   Lab Results  Component Value Date   HGBA1C 5.7 (H) 03/11/2021   HGBA1C 5.4 09/10/2020   HGBA1C 5.8 (H) 06/05/2020   HGBA1C 5.9 (H) 02/09/2020   HGBA1C 5.8 (H) 06/07/2019   Lab Results  Component Value Date   INSULIN 18.4 03/11/2021   INSULIN 14.3 09/10/2020   INSULIN 14.5 06/05/2020   INSULIN 17.6 02/09/2020   INSULIN 19.5 06/07/2019   Lab Results  Component Value Date   TSH 1.480 04/12/2018   Lab Results  Component Value Date   CHOL 163 03/11/2021   HDL 65 03/11/2021   LDLCALC 82 03/11/2021   TRIG 84 03/11/2021   CHOLHDL 2.5 03/11/2021   Lab Results  Component Value Date   VD25OH 75.1 03/11/2021   VD25OH 66.1  09/10/2020   VD25OH 67.0 06/05/2020   Lab Results  Component Value Date   WBC 7.5 09/10/2020   HGB 12.1 09/10/2020   HCT 36.8 09/10/2020   MCV 90 09/10/2020   PLT 273 09/10/2020   No results found for: IRON, TIBC, FERRITIN  Obesity Behavioral Intervention:   Approximately 15 minutes were spent on the discussion below.  ASK: We discussed the diagnosis of obesity with Kathleen [name] today and Kathleen [name] agreed to give [name] permission to discuss obesity behavioral modification therapy  today.  ASSESS: [name] has the diagnosis of obesity and her BMI today is 39.1. Kathleen [name] is in the action stage of change.   ADVISE: [name] was educated on the multiple health risks of obesity as well as the benefit of weight loss to improve her health. She was advised of the need for long term treatment and the importance of lifestyle modifications to improve her current health and to decrease her risk of future health problems.  AGREE: Multiple dietary modification options and treatment options were discussed and Kathleen [name] agreed to follow the recommendations documented in the above note.  ARRANGE: Kathleen [name] was educated on the importance of frequent visits to treat obesity as outlined per CMS and USPSTF guidelines and agreed to schedule her next follow up appointment today.  Attestation Statements:   Reviewed by clinician on day of visit: allergies, medications, problem list, medical history, surgical history, family history, social history, and previous encounter notes.  Coral Ceo, CMA, am acting as transcriptionist for [employer], PA-C.  I have reviewed the above documentation for accuracy and completeness, and I agree with the above. Abby Potash, PA-C

## 2021-04-25 DIAGNOSIS — R198 Other specified symptoms and signs involving the digestive system and abdomen: Secondary | ICD-10-CM | POA: Diagnosis not present

## 2021-05-14 DIAGNOSIS — E1169 Type 2 diabetes mellitus with other specified complication: Secondary | ICD-10-CM | POA: Diagnosis not present

## 2021-05-14 DIAGNOSIS — K219 Gastro-esophageal reflux disease without esophagitis: Secondary | ICD-10-CM | POA: Diagnosis not present

## 2021-05-14 DIAGNOSIS — I10 Essential (primary) hypertension: Secondary | ICD-10-CM | POA: Diagnosis not present

## 2021-05-14 DIAGNOSIS — H35033 Hypertensive retinopathy, bilateral: Secondary | ICD-10-CM | POA: Diagnosis not present

## 2021-05-14 DIAGNOSIS — E782 Mixed hyperlipidemia: Secondary | ICD-10-CM | POA: Diagnosis not present

## 2021-05-14 DIAGNOSIS — E1165 Type 2 diabetes mellitus with hyperglycemia: Secondary | ICD-10-CM | POA: Diagnosis not present

## 2021-05-22 ENCOUNTER — Ambulatory Visit (INDEPENDENT_AMBULATORY_CARE_PROVIDER_SITE_OTHER): Payer: Medicare Other | Admitting: Physician Assistant

## 2021-05-29 ENCOUNTER — Encounter (INDEPENDENT_AMBULATORY_CARE_PROVIDER_SITE_OTHER): Payer: Self-pay | Admitting: Physician Assistant

## 2021-05-29 ENCOUNTER — Other Ambulatory Visit: Payer: Self-pay

## 2021-05-29 ENCOUNTER — Ambulatory Visit (INDEPENDENT_AMBULATORY_CARE_PROVIDER_SITE_OTHER): Payer: Medicare Other | Admitting: Physician Assistant

## 2021-05-29 VITALS — BP 140/80 | HR 60 | Temp 97.9°F | Ht 64.0 in | Wt 231.0 lb

## 2021-05-29 DIAGNOSIS — Z6841 Body Mass Index (BMI) 40.0 and over, adult: Secondary | ICD-10-CM | POA: Diagnosis not present

## 2021-05-29 DIAGNOSIS — E119 Type 2 diabetes mellitus without complications: Secondary | ICD-10-CM | POA: Diagnosis not present

## 2021-05-29 NOTE — Progress Notes (Signed)
Chief Complaint:   Kathleen [name] is here to discuss her progress with her obesity treatment plan along with follow-up of her obesity related diagnoses. Kathleen [name] is on the Category 2 Plan and states she is following her eating plan approximately 50% of the time. Kathleen [name] states she is biking 10-15 minutes 2-3 times per week.  Today's visit was #: 46 Starting weight: 259 lbs Starting date: 04/12/2018 Today's weight: 231 lbs Today's date: 05/29/2021 Total lbs lost to date: 28 Total lbs lost since last in-office visit: 0  Interim History: Kathleen [name] reports going to [place] since Thanksgiving. She also spent a week at the beach and indulged in a few meals there. She has a colonoscopy scheduled for Dec 28.  Subjective:   1. Type 2 diabetes mellitus without complication, without long-term current use of insulin ([facility]) Pt's blood sugar runs 90-136. She is on Ozempic 0.5 mg. Her last A1c was 5.7.  Assessment/Plan:   1. Type 2 diabetes mellitus without complication, without long-term current use of insulin ([facility]) Good blood sugar control is important to decrease the likelihood of diabetic complications such as nephropathy, neuropathy, limb loss, blindness, coronary artery disease, and death. Intensive lifestyle modification including diet, exercise and weight loss are the first line of treatment for diabetes. We will increase Ozempic to 1 mg at next OV.  2. Obesity: Current BMI 39.63  Kathleen [name] is currently in the action stage of change. As such, her goal is to continue with weight loss efforts. She has agreed to the Category 2 Plan.   Exercise goals:  As is  Behavioral modification strategies: increasing lean protein intake and decreasing simple carbohydrates.  Kathleen [name] has agreed to follow-up with our clinic in 3 weeks. She was informed of the importance of frequent follow-up visits to maximize her success with intensive lifestyle modifications for her multiple health  conditions.   Objective:   Blood pressure 140/80, pulse 60, temperature 97.9 F (36.6 C), height 5\' 4"  (1.626 m), weight 231 lb (104.8 kg), SpO2 96 %. Body mass index is 39.65 kg/m.  General: Cooperative, alert, well developed, in no acute distress. HEENT: Conjunctivae and lids unremarkable. Cardiovascular: Regular rhythm.  Lungs: Normal work of breathing. Neurologic: No focal deficits.   Lab Results  Component Value Date   CREATININE 0.76 03/11/2021   BUN 27 03/11/2021   NA 142 03/11/2021   K 4.1 03/11/2021   CL 99 03/11/2021   CO2 24 03/11/2021   Lab Results  Component Value Date   ALT 14 03/11/2021   AST 16 03/11/2021   ALKPHOS 72 03/11/2021   BILITOT 0.4 03/11/2021   Lab Results  Component Value Date   HGBA1C 5.7 (H) 03/11/2021   HGBA1C 5.4 09/10/2020   HGBA1C 5.8 (H) 06/05/2020   HGBA1C 5.9 (H) 02/09/2020   HGBA1C 5.8 (H) 06/07/2019   Lab Results  Component Value Date   INSULIN 18.4 03/11/2021   INSULIN 14.3 09/10/2020   INSULIN 14.5 06/05/2020   INSULIN 17.6 02/09/2020   INSULIN 19.5 06/07/2019   Lab Results  Component Value Date   TSH 1.480 04/12/2018   Lab Results  Component Value Date   CHOL 163 03/11/2021   HDL 65 03/11/2021   LDLCALC 82 03/11/2021   TRIG 84 03/11/2021   CHOLHDL 2.5 03/11/2021   Lab Results  Component Value Date   VD25OH 75.1 03/11/2021   VD25OH 66.1 09/10/2020   VD25OH 67.0 06/05/2020   Lab Results  Component Value Date   WBC 7.5  09/10/2020   HGB 12.1 09/10/2020   HCT 36.8 09/10/2020   MCV 90 09/10/2020   PLT 273 09/10/2020   No results found for: IRON, TIBC, FERRITIN  Obesity Behavioral Intervention:   Approximately 15 minutes were spent on the discussion below.  ASK: We discussed the diagnosis of obesity with Kathleen [name] today and Kathleen [name] agreed to give [name] permission to discuss obesity behavioral modification therapy today.  ASSESS: Kathleen [name] has the diagnosis of obesity and her BMI today is 39.7. Kathleen [name]  is in the action stage of change.   ADVISE: Kathleen [name] was educated on the multiple health risks of obesity as well as the benefit of weight loss to improve her health. She was advised of the need for long term treatment and the importance of lifestyle modifications to improve her current health and to decrease her risk of future health problems.  AGREE: Multiple dietary modification options and treatment options were discussed and Kathleen [name] agreed to follow the recommendations documented in the above note.  ARRANGE: Kathleen [name] was educated on the importance of frequent visits to treat obesity as outlined per CMS and USPSTF guidelines and agreed to schedule her next follow up appointment today.  Attestation Statements:   Reviewed by clinician on day of visit: allergies, medications, problem list, medical history, surgical history, family history, social history, and previous encounter notes.  Coral Ceo, CMA, am acting as transcriptionist for [employer], PA-C.  I have reviewed the above documentation for accuracy and completeness, and I agree with the above. Abby Potash, PA-C

## 2021-06-13 DIAGNOSIS — K6289 Other specified diseases of anus and rectum: Secondary | ICD-10-CM | POA: Diagnosis not present

## 2021-06-13 DIAGNOSIS — K5730 Diverticulosis of large intestine without perforation or abscess without bleeding: Secondary | ICD-10-CM | POA: Diagnosis not present

## 2021-06-13 DIAGNOSIS — Z85048 Personal history of other malignant neoplasm of rectum, rectosigmoid junction, and anus: Secondary | ICD-10-CM | POA: Diagnosis not present

## 2021-06-13 DIAGNOSIS — D122 Benign neoplasm of ascending colon: Secondary | ICD-10-CM | POA: Diagnosis not present

## 2021-06-13 DIAGNOSIS — K648 Other hemorrhoids: Secondary | ICD-10-CM | POA: Diagnosis not present

## 2021-06-13 DIAGNOSIS — Z980 Intestinal bypass and anastomosis status: Secondary | ICD-10-CM | POA: Diagnosis not present

## 2021-06-19 DIAGNOSIS — D122 Benign neoplasm of ascending colon: Secondary | ICD-10-CM | POA: Diagnosis not present

## 2021-06-24 ENCOUNTER — Encounter (INDEPENDENT_AMBULATORY_CARE_PROVIDER_SITE_OTHER): Payer: Self-pay | Admitting: Physician Assistant

## 2021-06-24 ENCOUNTER — Other Ambulatory Visit: Payer: Self-pay

## 2021-06-24 ENCOUNTER — Ambulatory Visit (INDEPENDENT_AMBULATORY_CARE_PROVIDER_SITE_OTHER): Payer: Medicare Other | Admitting: Physician Assistant

## 2021-06-24 VITALS — BP 149/75 | HR 62 | Temp 97.5°F | Ht 64.0 in | Wt 232.0 lb

## 2021-06-24 DIAGNOSIS — Z6839 Body mass index (BMI) 39.0-39.9, adult: Secondary | ICD-10-CM

## 2021-06-24 DIAGNOSIS — E119 Type 2 diabetes mellitus without complications: Secondary | ICD-10-CM | POA: Diagnosis not present

## 2021-06-24 DIAGNOSIS — Z6841 Body Mass Index (BMI) 40.0 and over, adult: Secondary | ICD-10-CM

## 2021-06-24 MED ORDER — OZEMPIC (0.25 OR 0.5 MG/DOSE) 2 MG/1.5ML ~~LOC~~ SOPN: 0.5000 mg | PEN_INJECTOR | SUBCUTANEOUS | 0 refills | Status: DC

## 2021-06-24 NOTE — Progress Notes (Signed)
Chief Complaint:   [place] is here to discuss her progress with her obesity treatment plan along with follow-up of her obesity related diagnoses. Kathleen [name] is on the Category 2 Plan and states she is following her eating plan approximately 60% of the time. Kathleen [name] states she is using the stationary bike for 15 minutes 3-4 times per week.  Today's visit was #: 23 Starting weight: 259 lbs Starting date: 04/12/2018 Today's weight: 232 lbs Today's date: 06/24/2021 Total lbs lost to date: 27 lbs Total lbs lost since last in-office visit: 0  Interim History: Kathleen [name] reports that she overate over the holidays. She is having a difficult time drinking 64 ounces of water daily.   Subjective:   1. Type 2 diabetes mellitus without complication, without long-term current use of insulin (HCC) Kathleen [name]'s blood sugar averages 91-132. She is on Ozempic 0.5 mg. Her appetite is well controlled.  Assessment/Plan:   1. Type 2 diabetes mellitus without complication, without long-term current use of insulin (HCC) We will refill Ozempic 0.5 mg with no refills. Good blood sugar control is important to decrease the likelihood of diabetic complications such as nephropathy, neuropathy, limb loss, blindness, coronary artery disease, and death. Intensive lifestyle modification including diet, exercise and weight loss are the first line of treatment for diabetes.   - Semaglutide,0.25 or 0.5MG /DOS, (OZEMPIC, 0.25 OR 0.5 MG/DOSE,) 2 MG/1.5ML SOPN; Inject 0.5 mg into the skin once a week.  Dispense: 2 mL; Refill: 0  2. Obesity: Current BMI 39.8 Kathleen [name] is currently in the action stage of change. As such, her goal is to continue with weight loss efforts. She has [name] to the Category 2 Plan.   Exercise goals: No exercise has been prescribed at this time.  Behavioral modification strategies: increasing lean protein intake, planning for success, and keeping a strict food journal.  Kathleen [name] has [name]  to follow-up with our clinic in 3 weeks. She was informed of the importance of frequent follow-up visits to maximize her success with intensive lifestyle modifications for her multiple health conditions.   Objective:   Blood pressure (!) 149/75, pulse 62, temperature (!) 97.5 F (36.4 C), height 5\' 4"  (1.626 m), weight 232 lb (105.2 kg), SpO2 99 %. Body mass index is 39.82 kg/m.  General: Cooperative, alert, well developed, in no acute distress. HEENT: Conjunctivae and lids unremarkable. Cardiovascular: Regular rhythm.  Lungs: Normal work of breathing. Neurologic: No focal deficits.   Lab Results  Component Value Date   CREATININE 0.76 03/11/2021   BUN 27 03/11/2021   NA 142 03/11/2021   K 4.1 03/11/2021   CL 99 03/11/2021   CO2 24 03/11/2021   Lab Results  Component Value Date   ALT 14 03/11/2021   AST 16 03/11/2021   ALKPHOS 72 03/11/2021   BILITOT 0.4 03/11/2021   Lab Results  Component Value Date   HGBA1C 5.7 (H) 03/11/2021   HGBA1C 5.4 09/10/2020   HGBA1C 5.8 (H) 06/05/2020   HGBA1C 5.9 (H) 02/09/2020   HGBA1C 5.8 (H) 06/07/2019   Lab Results  Component Value Date   INSULIN 18.4 03/11/2021   INSULIN 14.3 09/10/2020   INSULIN 14.5 06/05/2020   INSULIN 17.6 02/09/2020   INSULIN 19.5 06/07/2019   Lab Results  Component Value Date   TSH 1.480 04/12/2018   Lab Results  Component Value Date   CHOL 163 03/11/2021   HDL 65 03/11/2021   LDLCALC 82 03/11/2021   TRIG 84 03/11/2021   CHOLHDL 2.5 03/11/2021  Lab Results  Component Value Date   VD25OH 75.1 03/11/2021   VD25OH 66.1 09/10/2020   VD25OH 67.0 06/05/2020   Lab Results  Component Value Date   WBC 7.5 09/10/2020   HGB 12.1 09/10/2020   HCT 36.8 09/10/2020   MCV 90 09/10/2020   PLT 273 09/10/2020   No results found for: IRON, TIBC, FERRITIN  Obesity Behavioral Intervention:   Approximately 15 minutes were spent on the discussion below.  ASK: We discussed the diagnosis of obesity with  Kathleen [name] today and Kathleen [name] to give [name] permission to discuss obesity behavioral modification therapy today.  ASSESS: Kathleen [name] has the diagnosis of obesity and her BMI today is 39.8. Kathleen [name] is in the action stage of change.   ADVISE: Kathleen [name] was educated on the multiple health risks of obesity as well as the benefit of weight loss to improve her health. She was advised of the need for long term treatment and the importance of lifestyle modifications to improve her current health and to decrease her risk of future health problems.  AGREE: Multiple dietary modification options and treatment options were discussed and Kathleen [name] to follow the recommendations documented in the above note.  ARRANGE: Kathleen [name] was educated on the importance of frequent visits to treat obesity as outlined per CMS and USPSTF guidelines and [name] to schedule her next follow up appointment today.  Attestation Statements:   Reviewed by clinician on day of visit: allergies, medications, problem list, medical history, surgical history, family history, social history, and previous encounter notes.  I, Tonye Pearson, am acting as Location manager for [employer], PA-C.  I have reviewed the above documentation for accuracy and completeness, and I agree with the above. Abby Potash, PA-C

## 2021-06-26 ENCOUNTER — Encounter (INDEPENDENT_AMBULATORY_CARE_PROVIDER_SITE_OTHER): Payer: Self-pay | Admitting: Physician Assistant

## 2021-06-26 DIAGNOSIS — Z853 Personal history of malignant neoplasm of breast: Secondary | ICD-10-CM | POA: Diagnosis not present

## 2021-06-26 DIAGNOSIS — R1011 Right upper quadrant pain: Secondary | ICD-10-CM | POA: Diagnosis not present

## 2021-06-26 DIAGNOSIS — E782 Mixed hyperlipidemia: Secondary | ICD-10-CM | POA: Diagnosis not present

## 2021-06-26 DIAGNOSIS — I10 Essential (primary) hypertension: Secondary | ICD-10-CM | POA: Diagnosis not present

## 2021-06-26 DIAGNOSIS — Z85038 Personal history of other malignant neoplasm of large intestine: Secondary | ICD-10-CM | POA: Diagnosis not present

## 2021-06-26 DIAGNOSIS — K219 Gastro-esophageal reflux disease without esophagitis: Secondary | ICD-10-CM | POA: Diagnosis not present

## 2021-06-26 DIAGNOSIS — E1169 Type 2 diabetes mellitus with other specified complication: Secondary | ICD-10-CM | POA: Diagnosis not present

## 2021-06-27 ENCOUNTER — Other Ambulatory Visit: Payer: Self-pay | Admitting: Family Medicine

## 2021-06-27 ENCOUNTER — Other Ambulatory Visit (HOSPITAL_COMMUNITY): Payer: Self-pay | Admitting: Family Medicine

## 2021-06-27 DIAGNOSIS — R1011 Right upper quadrant pain: Secondary | ICD-10-CM

## 2021-07-05 ENCOUNTER — Other Ambulatory Visit: Payer: Self-pay

## 2021-07-05 ENCOUNTER — Encounter (HOSPITAL_COMMUNITY)
Admission: RE | Admit: 2021-07-05 | Discharge: 2021-07-05 | Disposition: A | Discharge status: Home / Self Care | Payer: Medicare Other | Source: Ambulatory Visit | Attending: Family Medicine | Admitting: Family Medicine

## 2021-07-05 DIAGNOSIS — R1011 Right upper quadrant pain: Secondary | ICD-10-CM | POA: Diagnosis not present

## 2021-07-05 MED ORDER — TECHNETIUM TC 99M MEBROFENIN IV KIT
5.1000 | PACK | Freq: Once | INTRAVENOUS | Status: AC | PRN
  Administered 2021-07-05: 5.1 via INTRAVENOUS

## 2021-07-10 DIAGNOSIS — K219 Gastro-esophageal reflux disease without esophagitis: Secondary | ICD-10-CM | POA: Diagnosis not present

## 2021-07-10 DIAGNOSIS — H35033 Hypertensive retinopathy, bilateral: Secondary | ICD-10-CM | POA: Diagnosis not present

## 2021-07-10 DIAGNOSIS — I10 Essential (primary) hypertension: Secondary | ICD-10-CM | POA: Diagnosis not present

## 2021-07-10 DIAGNOSIS — E782 Mixed hyperlipidemia: Secondary | ICD-10-CM | POA: Diagnosis not present

## 2021-07-10 DIAGNOSIS — E1169 Type 2 diabetes mellitus with other specified complication: Secondary | ICD-10-CM | POA: Diagnosis not present

## 2021-07-10 DIAGNOSIS — E1165 Type 2 diabetes mellitus with hyperglycemia: Secondary | ICD-10-CM | POA: Diagnosis not present

## 2021-07-12 ENCOUNTER — Other Ambulatory Visit: Payer: Self-pay | Admitting: Family Medicine

## 2021-07-12 DIAGNOSIS — R1010 Upper abdominal pain, unspecified: Secondary | ICD-10-CM

## 2021-07-15 ENCOUNTER — Ambulatory Visit (INDEPENDENT_AMBULATORY_CARE_PROVIDER_SITE_OTHER): Payer: Medicare Other | Admitting: Physician Assistant

## 2021-07-15 ENCOUNTER — Other Ambulatory Visit: Payer: Self-pay

## 2021-07-15 ENCOUNTER — Encounter (INDEPENDENT_AMBULATORY_CARE_PROVIDER_SITE_OTHER): Payer: Self-pay | Admitting: Physician Assistant

## 2021-07-15 VITALS — BP 145/73 | HR 57 | Temp 98.0°F | Ht 64.0 in | Wt 233.0 lb

## 2021-07-15 DIAGNOSIS — Z6839 Body mass index (BMI) 39.0-39.9, adult: Secondary | ICD-10-CM | POA: Diagnosis not present

## 2021-07-15 DIAGNOSIS — E669 Obesity, unspecified: Secondary | ICD-10-CM

## 2021-07-15 DIAGNOSIS — E119 Type 2 diabetes mellitus without complications: Secondary | ICD-10-CM | POA: Diagnosis not present

## 2021-07-15 DIAGNOSIS — Z6841 Body Mass Index (BMI) 40.0 and over, adult: Secondary | ICD-10-CM

## 2021-07-15 NOTE — Progress Notes (Signed)
Chief Complaint:   Kathleen [name] is here to discuss her progress with her obesity treatment plan along with follow-up of her obesity related diagnoses. Kathleen [name] is on the Category 2 Plan and states she is following her eating plan approximately 75% of the time. Kathleen [name] states she is riding the exercise bike for 10-15 minutes 2-3 times per week.  Today's visit was #: 22 Starting weight: 259 lbs Starting date: 04/12/2018 Today's weight: 233 lbs Today's date: 07/15/2021 Total lbs lost to date: 26 Total lbs lost since last in-office visit: 0  Interim History: Kathleen [name] continues to have abdominal discomfort intermittently and in seeing her primary care physician for that. Her HIDA scan was negative and her primary care physician is scheduling her for a CT scan. She is eating out more for dinner and she is making stews and chili at home. She is under quite a bit of stress currently.  Subjective:   1. Type 2 diabetes mellitus without complication, without long-term current use of insulin ([place]) Kathleen [name] stopped her Ozempic at the end of last month, which was recommended by her primary care physician. She is currently taking metformin only. Her blood sugars this morning was 141. Last A1c was 5.7.   Assessment/Plan:   1. Type 2 diabetes mellitus without complication, without long-term current use of insulin [facility]) Kathleen [name] will follow up with her primary care physician. She is to stop metformin for a few days to see if her appetite changes. She will check her blood sugars and see if she has any abdominal discomfort. Good blood sugar control is important to decrease the likelihood of diabetic complications such as nephropathy, neuropathy, limb loss, blindness, coronary artery disease, and death. Intensive lifestyle modification including diet, exercise and weight loss are the first line of treatment for diabetes.   2. Obesity: Current BMI 39.97 Kathleen [name] is currently in the action stage of  change. As such, her goal is to continue with weight loss efforts. She has agreed to the Category 2 Plan.   Exercise goals: As is.  Behavioral modification strategies: meal planning and cooking strategies, planning for success, and keeping a strict food journal.  Kathleen [name] has agreed to follow-up with our clinic in 3 weeks. She was informed of the importance of frequent follow-up visits to maximize her success with intensive lifestyle modifications for her multiple health conditions.   Objective:   Blood pressure (!) 145/73, pulse (!) 57, temperature 98 F (36.7 C), height 5\' 4"  (1.626 m), weight 233 lb (105.7 kg), SpO2 100 %. Body mass index is 39.99 kg/m.  General: Cooperative, alert, well developed, in no acute distress. HEENT: Conjunctivae and lids unremarkable. Cardiovascular: Regular rhythm.  Lungs: Normal work of breathing. Neurologic: No focal deficits.   Lab Results  Component Value Date   CREATININE 0.76 03/11/2021   BUN 27 03/11/2021   NA 142 03/11/2021   K 4.1 03/11/2021   CL 99 03/11/2021   CO2 24 03/11/2021   Lab Results  Component Value Date   ALT 14 03/11/2021   AST 16 03/11/2021   ALKPHOS 72 03/11/2021   BILITOT 0.4 03/11/2021   Lab Results  Component Value Date   HGBA1C 5.7 (H) 03/11/2021   HGBA1C 5.4 09/10/2020   HGBA1C 5.8 (H) 06/05/2020   HGBA1C 5.9 (H) 02/09/2020   HGBA1C 5.8 (H) 06/07/2019   Lab Results  Component Value Date   INSULIN 18.4 03/11/2021   INSULIN 14.3 09/10/2020   INSULIN 14.5 06/05/2020   INSULIN 17.6 02/09/2020  INSULIN 19.5 06/07/2019   Lab Results  Component Value Date   TSH 1.480 04/12/2018   Lab Results  Component Value Date   CHOL 163 03/11/2021   HDL 65 03/11/2021   LDLCALC 82 03/11/2021   TRIG 84 03/11/2021   CHOLHDL 2.5 03/11/2021   Lab Results  Component Value Date   VD25OH 75.1 03/11/2021   VD25OH 66.1 09/10/2020   VD25OH 67.0 06/05/2020   Lab Results  Component Value Date   WBC 7.5 09/10/2020    HGB 12.1 09/10/2020   HCT 36.8 09/10/2020   MCV 90 09/10/2020   PLT 273 09/10/2020   No results found for: IRON, TIBC, FERRITIN  Obesity Behavioral Intervention:   Approximately 15 minutes were spent on the discussion below.  ASK: We discussed the diagnosis of obesity with Kathleen [name] today and Kathleen [name] agreed to give [name] permission to discuss obesity behavioral modification therapy today.  ASSESS: Kathleen [name] has the diagnosis of obesity and her BMI today is 40.1. Kathleen [name] is in the action stage of change.   ADVISE: Kathleen [name] was educated on the multiple health risks of obesity as well as the benefit of weight loss to improve her health. She was advised of the need for long term treatment and the importance of lifestyle modifications to improve her current health and to decrease her risk of future health problems.  AGREE: Multiple dietary modification options and treatment options were discussed and Kathleen [name] agreed to follow the recommendations documented in the above note.  ARRANGE: Kathleen [name] was educated on the importance of frequent visits to treat obesity as outlined per CMS and USPSTF guidelines and agreed to schedule her next follow up appointment today.  Attestation Statements:   Reviewed by clinician on day of visit: allergies, medications, problem list, medical history, surgical history, family history, social history, and previous encounter notes.   Wilhemena Durie, am acting as transcriptionist for [employer], PA-C.  I have reviewed the above documentation for accuracy and completeness, and I agree with the above. Abby Potash, PA-C

## 2021-08-07 ENCOUNTER — Other Ambulatory Visit: Payer: Self-pay

## 2021-08-07 ENCOUNTER — Encounter (INDEPENDENT_AMBULATORY_CARE_PROVIDER_SITE_OTHER): Payer: Self-pay | Admitting: Physician Assistant

## 2021-08-07 ENCOUNTER — Other Ambulatory Visit: Payer: Medicare Other

## 2021-08-07 ENCOUNTER — Ambulatory Visit (INDEPENDENT_AMBULATORY_CARE_PROVIDER_SITE_OTHER): Payer: Medicare Other | Admitting: Physician Assistant

## 2021-08-07 VITALS — BP 160/74 | HR 57 | Ht 64.0 in | Wt 234.0 lb

## 2021-08-07 DIAGNOSIS — E559 Vitamin D deficiency, unspecified: Secondary | ICD-10-CM | POA: Diagnosis not present

## 2021-08-07 DIAGNOSIS — E119 Type 2 diabetes mellitus without complications: Secondary | ICD-10-CM

## 2021-08-07 DIAGNOSIS — Z6841 Body Mass Index (BMI) 40.0 and over, adult: Secondary | ICD-10-CM | POA: Diagnosis not present

## 2021-08-07 DIAGNOSIS — E669 Obesity, unspecified: Secondary | ICD-10-CM | POA: Diagnosis not present

## 2021-08-07 DIAGNOSIS — Z7984 Long term (current) use of oral hypoglycemic drugs: Secondary | ICD-10-CM | POA: Diagnosis not present

## 2021-08-07 DIAGNOSIS — I10 Essential (primary) hypertension: Secondary | ICD-10-CM | POA: Diagnosis not present

## 2021-08-07 DIAGNOSIS — E1169 Type 2 diabetes mellitus with other specified complication: Secondary | ICD-10-CM

## 2021-08-07 NOTE — Progress Notes (Signed)
Chief Complaint:   [name] is here to discuss her progress with her obesity treatment plan along with follow-up of her obesity related diagnoses. Kathleen [name] is on the Category 2 Plan and states she is following her eating plan approximately 50% of the time. Kathleen [name] states she is riding the exercise bike for 15 minutes 2-3 times per week.  Today's visit was #: 16 Starting weight: 259 lbs Starting date: 04/12/2018 Today's weight: 234 lbs Today's date: 08/07/2021 Total lbs lost to date: 25 Total lbs lost since last in-office visit: 0  Interim History: Kathleen [name] reports that her stress level has been very high. She has not had time to take care of herself. She continues to have abdominal pain and CT scan is tomorrow. She is keeping track of her protein but not her calories.  Subjective:   1. Type 2 diabetes mellitus without complication, without long-term current use of insulin (HCC) Kathleen [name]'s fasting blood sugars range between 114-174. She came off metformin for 4 days and blood sugars were stable. She is now back on metformin, and she denies hypoglycemia.   2. Essential hypertension Kathleen [name]'s blood pressure readings at home range from 115-130's. Her blood pressure is elevated today, but she reports rushing in to her office visit. She denies headache or chest pain. She is taking her medications as prescribed.   Assessment/Plan:   1. Type 2 diabetes mellitus without complication, without long-term current use of insulin [facility]) Kathleen [name] will continue with metformin and monitor A1c. Good blood sugar control is important to decrease the likelihood of diabetic complications such as nephropathy, neuropathy, limb loss, blindness, coronary artery disease, and death. Intensive lifestyle modification including diet, exercise and weight loss are the first line of treatment for diabetes.   2. Essential hypertension Kathleen [name] will continue to follow up with Dr. Kenton Kingfisher. She is to check her  blood pressure at home daily, and if over 150-90 she is to contact her primary care physician. She will continue working on healthy weight loss and exercise to improve blood pressure control. We will watch for signs of hypotension as she continues her lifestyle modifications.  3. Obesity: Current BMI 40.15 Kathleen [name] is currently in the action stage of change. As such, her goal is to continue with weight loss efforts. She has agreed to the Category 2 Plan and keeping a food journal and adhering to recommended goals of 1100 calories and 80 grams of protein daily.   Exercise goals: As is.  Behavioral modification strategies: increasing lean protein intake and decreasing simple carbohydrates.  Kathleen [name] has agreed to follow-up with our clinic in 3 weeks. She was informed of the importance of frequent follow-up visits to maximize her success with intensive lifestyle modifications for her multiple health conditions.   Objective:   Blood pressure (!) 160/74, pulse (!) 57, height 5\' 4"  (1.626 m), weight 234 lb (106.1 kg), SpO2 99 %. Body mass index is 40.17 kg/m.  General: Cooperative, alert, well developed, in no acute distress. HEENT: Conjunctivae and lids unremarkable. Cardiovascular: Regular rhythm.  Lungs: Normal work of breathing. Neurologic: No focal deficits.   Lab Results  Component Value Date   CREATININE 0.76 03/11/2021   BUN 27 03/11/2021   NA 142 03/11/2021   K 4.1 03/11/2021   CL 99 03/11/2021   CO2 24 03/11/2021   Lab Results  Component Value Date   ALT 14 03/11/2021   AST 16 03/11/2021   ALKPHOS 72 03/11/2021   BILITOT 0.4 03/11/2021   Lab Results  Component Value Date   HGBA1C 5.7 (H) 03/11/2021   HGBA1C 5.4 09/10/2020   HGBA1C 5.8 (H) 06/05/2020   HGBA1C 5.9 (H) 02/09/2020   HGBA1C 5.8 (H) 06/07/2019   Lab Results  Component Value Date   INSULIN 18.4 03/11/2021   INSULIN 14.3 09/10/2020   INSULIN 14.5 06/05/2020   INSULIN 17.6 02/09/2020   INSULIN 19.5  06/07/2019   Lab Results  Component Value Date   TSH 1.480 04/12/2018   Lab Results  Component Value Date   CHOL 163 03/11/2021   HDL 65 03/11/2021   LDLCALC 82 03/11/2021   TRIG 84 03/11/2021   CHOLHDL 2.5 03/11/2021   Lab Results  Component Value Date   VD25OH 75.1 03/11/2021   VD25OH 66.1 09/10/2020   VD25OH 67.0 06/05/2020   Lab Results  Component Value Date   WBC 7.5 09/10/2020   HGB 12.1 09/10/2020   HCT 36.8 09/10/2020   MCV 90 09/10/2020   PLT 273 09/10/2020   No results found for: IRON, TIBC, FERRITIN  Obesity Behavioral Intervention:   Approximately 15 minutes were spent on the discussion below.  ASK: We discussed the diagnosis of obesity with Kathleen [name] today and Kathleen [name] agreed to give [name] permission to discuss obesity behavioral modification therapy today.  ASSESS: Kathleen [name] has the diagnosis of obesity and her BMI today is 40.3. Kathleen [name] is in the action stage of change.   ADVISE: Kathleen [name] was educated on the multiple health risks of obesity as well as the benefit of weight loss to improve her health. She was advised of the need for long term treatment and the importance of lifestyle modifications to improve her current health and to decrease her risk of future health problems.  AGREE: Multiple dietary modification options and treatment options were discussed and Kathleen [name] agreed to follow the recommendations documented in the above note.  ARRANGE: Kathleen [name] was educated on the importance of frequent visits to treat obesity as outlined per CMS and USPSTF guidelines and agreed to schedule her next follow up appointment today.  Attestation Statements:   Reviewed by clinician on day of visit: allergies, medications, problem list, medical history, surgical history, family history, social history, and previous encounter notes.   Wilhemena Durie, am acting as transcriptionist for [employer], PA-C.  I have reviewed the above documentation for accuracy and  completeness, and I agree with the above. Abby Potash, PA-C

## 2021-08-08 ENCOUNTER — Ambulatory Visit
Admission: RE | Admit: 2021-08-08 | Discharge: 2021-08-08 | Disposition: A | Discharge status: Home / Self Care | Payer: Medicare Other | Source: Ambulatory Visit | Attending: Family Medicine | Admitting: Family Medicine

## 2021-08-08 DIAGNOSIS — R1010 Upper abdominal pain, unspecified: Secondary | ICD-10-CM

## 2021-08-08 DIAGNOSIS — K760 Fatty (change of) liver, not elsewhere classified: Secondary | ICD-10-CM | POA: Diagnosis not present

## 2021-08-08 DIAGNOSIS — K5730 Diverticulosis of large intestine without perforation or abscess without bleeding: Secondary | ICD-10-CM | POA: Diagnosis not present

## 2021-08-08 MED ORDER — IOPAMIDOL (ISOVUE-300) INJECTION 61%
100.0000 mL | Freq: Once | INTRAVENOUS | Status: AC | PRN
  Administered 2021-08-08: 100 mL via INTRAVENOUS

## 2021-09-02 ENCOUNTER — Ambulatory Visit (INDEPENDENT_AMBULATORY_CARE_PROVIDER_SITE_OTHER): Payer: Medicare Other | Admitting: Physician Assistant

## 2021-09-02 ENCOUNTER — Other Ambulatory Visit: Payer: Self-pay

## 2021-09-02 ENCOUNTER — Encounter (INDEPENDENT_AMBULATORY_CARE_PROVIDER_SITE_OTHER): Payer: Self-pay | Admitting: Physician Assistant

## 2021-09-02 VITALS — BP 142/78 | HR 56 | Temp 98.1°F | Ht 64.0 in | Wt 231.0 lb

## 2021-09-02 DIAGNOSIS — E669 Obesity, unspecified: Secondary | ICD-10-CM

## 2021-09-02 DIAGNOSIS — Z6841 Body Mass Index (BMI) 40.0 and over, adult: Secondary | ICD-10-CM | POA: Diagnosis not present

## 2021-09-02 DIAGNOSIS — I10 Essential (primary) hypertension: Secondary | ICD-10-CM | POA: Diagnosis not present

## 2021-09-02 DIAGNOSIS — E1169 Type 2 diabetes mellitus with other specified complication: Secondary | ICD-10-CM | POA: Diagnosis not present

## 2021-09-02 DIAGNOSIS — Z7984 Long term (current) use of oral hypoglycemic drugs: Secondary | ICD-10-CM | POA: Diagnosis not present

## 2021-09-02 DIAGNOSIS — E119 Type 2 diabetes mellitus without complications: Secondary | ICD-10-CM

## 2021-09-02 DIAGNOSIS — E559 Vitamin D deficiency, unspecified: Secondary | ICD-10-CM

## 2021-09-02 DIAGNOSIS — E785 Hyperlipidemia, unspecified: Secondary | ICD-10-CM | POA: Diagnosis not present

## 2021-09-03 LAB — COMPREHENSIVE METABOLIC PANEL
ALT: 12 IU/L (ref 0–32)
AST: 12 IU/L (ref 0–40)
Albumin/Globulin Ratio: 1.6 (ref 1.2–2.2)
Albumin: 4.5 g/dL (ref 3.7–4.7)
Alkaline Phosphatase: 71 IU/L (ref 44–121)
BUN/Creatinine Ratio: 31 — ABNORMAL HIGH (ref 12–28)
BUN: 31 mg/dL — ABNORMAL HIGH (ref 8–27)
Bilirubin Total: 0.3 mg/dL (ref 0.0–1.2)
CO2: 23 mmol/L (ref 20–29)
Calcium: 9.7 mg/dL (ref 8.7–10.3)
Chloride: 103 mmol/L (ref 96–106)
Creatinine, Ser: 1 mg/dL (ref 0.57–1.00)
Globulin, Total: 2.9 g/dL (ref 1.5–4.5)
Glucose: 113 mg/dL — ABNORMAL HIGH (ref 70–99)
Potassium: 4.3 mmol/L (ref 3.5–5.2)
Sodium: 141 mmol/L (ref 134–144)
Total Protein: 7.4 g/dL (ref 6.0–8.5)
eGFR: 57 mL/min/{1.73_m2} — ABNORMAL LOW (ref >59)

## 2021-09-03 LAB — INSULIN, RANDOM: INSULIN: 18.4 u[IU]/mL (ref 2.6–24.9)

## 2021-09-03 LAB — LIPID PANEL
Chol/HDL Ratio: 2.2 ratio (ref 0.0–4.4)
Cholesterol, Total: 152 mg/dL (ref 100–199)
HDL: 70 mg/dL (ref >39)
LDL Chol Calc (NIH): 67 mg/dL (ref 0–99)
Triglycerides: 78 mg/dL (ref 0–149)
VLDL Cholesterol Cal: 15 mg/dL (ref 5–40)

## 2021-09-03 LAB — HEMOGLOBIN A1C
Est. average glucose Bld gHb Est-mCnc: 126 mg/dL
Hgb A1c MFr Bld: 6 % — ABNORMAL HIGH (ref 4.8–5.6)

## 2021-09-03 LAB — VITAMIN D 25 HYDROXY (VIT D DEFICIENCY, FRACTURES): Vit D, 25-Hydroxy: 72.1 ng/mL (ref 30.0–100.0)

## 2021-09-04 NOTE — Progress Notes (Signed)
? ? ? ?Chief Complaint:  ? ?OBESITY ?Kathleen [name] is here to discuss her progress with her obesity treatment plan along with follow-up of her obesity related diagnoses. Kathleen [name] is on the Category 2 Plan and keeping a food journal and adhering to recommended goals of 1100 calories and 80 grams of  protein and states she is following her eating plan approximately 80% of the time. Kathleen [name] states she is using the stationary bike for 20-30 minutes 5 times per week. ? ?Today's visit was #: 15 ?Starting weight: 259 lbs ?Starting date: 04/12/2018 ?Today's weight: 231 lbs ?Today's date: 09/02/2021 ?Total lbs lost to date: 28 lbs ?Total lbs lost since last in-office visit: 3 lbs ? ?Interim History: [name] continues to have some abdominal pain intermittently and is seeing her primary care physician and gastroenterologist. She has been averaging 1100 calories and 90 grams of protein. She has increased her activity on her exercise bike.  ? ?Subjective:  ? ?1. Essential hypertension ?Kathleen [name] checks her blood pressure at home and it runs in the ranges of 120-130's/60-70's. She report she rode here with her husband this morning who may have increased it.  ? ?2. Type 2 diabetes mellitus without complication, without long-term current use of insulin ([place]) ?Rosamae's fasting blood sugar was in the range of 82-139. She denies hypoglycemia. Her last A1C was 5.7. She is taking Metformin as prescribed.  ? ?3. Vitamin D deficiency ?Kathleen [name] is on Vitamin D weekly.  ? ?4. Hyperlipidemia associated with type 2 diabetes mellitus ([place]) ?Kathleen [name] is on Zocor and she is tolerating it well.  ? ?Assessment/Plan:  ? ?1. Essential hypertension ?Kathleen [name] will continue with monitoring of blood pressure at home. She is working on healthy weight loss and exercise to improve blood pressure control. We will watch for signs of hypotension as she continues her lifestyle modifications. ? ?2. Type 2 diabetes mellitus without complication, without long-term  current use of insulin ([place]) ?We will check labs today. Good blood sugar control is important to decrease the likelihood of diabetic complications such as nephropathy, neuropathy, limb loss, blindness, coronary artery disease, and death. Intensive lifestyle modification including diet, exercise and weight loss are the first line of treatment for diabetes.  ? ?- Comprehensive metabolic panel ?- Hemoglobin A1c ?- Insulin, random ? ?3. Vitamin D deficiency ?Low Vitamin D level contributes to fatigue and are associated with obesity, breast, and colon cancer. We will check Vitamin D today and Kathleen [name] will follow-up for routine testing of Vitamin D, at least 2-3 times per year to avoid over-replacement. ? ?- VITAMIN D 25 Hydroxy (Vit-D Deficiency, Fractures) ? ?4. Hyperlipidemia associated with type 2 diabetes mellitus ([place]) ?Cardiovascular risk and specific lipid/LDL goals reviewed.  We will check labs today. We discussed several lifestyle modifications today and Kathleen [name] will continue to work on diet, exercise and weight loss efforts. Orders and follow up as documented in patient record.  ? ?Counseling ?Intensive lifestyle modifications are the first line treatment for this issue. ?Dietary changes: Increase soluble fiber. Decrease simple carbohydrates. ?Exercise changes: Moderate to vigorous-intensity aerobic activity 150 minutes per week if tolerated. ?Lipid-lowering medications: see documented in medical record. ?- Lipid panel ? ?5. Obesity: Current BMI 40.15 ?Kathleen [name] is currently in the action stage of change. As such, her goal is to continue with weight loss efforts. She has agreed to keeping a food journal and adhering to recommended goals of 1100 calories and 80 grams of protein daily.  ? ?Exercise goals:  As is. ? ?Behavioral modification strategies: planning  for success and keeping a strict food journal. ? ?Kathleen [name] has agreed to follow-up with our clinic in 3 weeks. She was informed of the importance of  frequent follow-up visits to maximize her success with intensive lifestyle modifications for her multiple health conditions.  ? ?Kathleen [name] was informed we would discuss her lab results at her next visit unless there is a critical issue that needs to be addressed sooner. Kathleen [name] agreed to keep her next visit at the agreed upon time to discuss these results. ? ?Objective:  ? ?Blood pressure (!) 142/78, pulse (!) 56, temperature 98.1 ?F (36.7 ?C), height '5\' 4"'$  (1.626 m), weight 231 lb (104.8 kg), SpO2 99 %. ?Body mass index is 39.65 kg/m?. ? ?General: Cooperative, alert, well developed, in no acute distress. ?HEENT: Conjunctivae and lids unremarkable. ?Cardiovascular: Regular rhythm.  ?Lungs: Normal work of breathing. ?Neurologic: No focal deficits.  ? ?Lab Results  ?Component Value Date  ? CREATININE 1.00 09/02/2021  ? BUN 31 (H) 09/02/2021  ? NA 141 09/02/2021  ? K 4.3 09/02/2021  ? CL 103 09/02/2021  ? CO2 23 09/02/2021  ? ?Lab Results  ?Component Value Date  ? ALT 12 09/02/2021  ? AST 12 09/02/2021  ? ALKPHOS 71 09/02/2021  ? BILITOT 0.3 09/02/2021  ? ?Lab Results  ?Component Value Date  ? HGBA1C 6.0 (H) 09/02/2021  ? HGBA1C 5.7 (H) 03/11/2021  ? HGBA1C 5.4 09/10/2020  ? HGBA1C 5.8 (H) 06/05/2020  ? HGBA1C 5.9 (H) 02/09/2020  ? ?Lab Results  ?Component Value Date  ? INSULIN 18.4 09/02/2021  ? INSULIN 18.4 03/11/2021  ? INSULIN 14.3 09/10/2020  ? INSULIN 14.5 06/05/2020  ? INSULIN 17.6 02/09/2020  ? ?Lab Results  ?Component Value Date  ? TSH 1.480 04/12/2018  ? ?Lab Results  ?Component Value Date  ? CHOL 152 09/02/2021  ? HDL 70 09/02/2021  ? [place] 67 09/02/2021  ? TRIG 78 09/02/2021  ? CHOLHDL 2.2 09/02/2021  ? ?Lab Results  ?Component Value Date  ? VD25OH 72.1 09/02/2021  ? VD25OH 75.1 03/11/2021  ? VD25OH 66.1 09/10/2020  ? ?Lab Results  ?Component Value Date  ? WBC 7.5 09/10/2020  ? HGB 12.1 09/10/2020  ? HCT 36.8 09/10/2020  ? MCV 90 09/10/2020  ? PLT 273 09/10/2020  ? ?No results found for: IRON, TIBC,  FERRITIN ? ?Obesity Behavioral Intervention:  ? ?Approximately 15 minutes were spent on the discussion below. ? ?ASK: ?We discussed the diagnosis of obesity with Kathleen [name] today and Kathleen [name] agreed to give [name] permission to discuss obesity behavioral modification therapy today. ? ?ASSESS: ?[name] has the diagnosis of obesity and her BMI today is 39.7. Kathleen [name] is in the action stage of change.  ? ?ADVISE: ?[name] was educated on the multiple health risks of obesity as well as the benefit of weight loss to improve her health. She was advised of the need for long term treatment and the importance of lifestyle modifications to improve her current health and to decrease her risk of future health problems. ? ?AGREE: ?Multiple dietary modification options and treatment options were discussed and Kathleen [name] agreed to follow the recommendations documented in the above note. ? ?ARRANGE: ?[name] was educated on the importance of frequent visits to treat obesity as outlined per CMS and USPSTF guidelines and agreed to schedule her next follow up appointment today. ? ?Attestation Statements:  ? ?Reviewed by clinician on day of visit: allergies, medications, problem list, medical history, surgical history, family history, social history, and previous encounter notes. ? ?I,  Tonye Pearson, am acting as Location manager for [employer], PA-C. ? ?I have reviewed the above documentation for accuracy and completeness, and I agree with the above. Abby Potash, PA-C ? ?

## 2021-09-18 ENCOUNTER — Ambulatory Visit
Admission: RE | Admit: 2021-09-18 | Discharge: 2021-09-18 | Disposition: A | Discharge status: Home / Self Care | Payer: Medicare Other | Source: Ambulatory Visit | Attending: Family Medicine | Admitting: Family Medicine

## 2021-09-18 ENCOUNTER — Other Ambulatory Visit: Payer: Self-pay | Admitting: Family Medicine

## 2021-09-18 DIAGNOSIS — R197 Diarrhea, unspecified: Secondary | ICD-10-CM | POA: Diagnosis not present

## 2021-09-18 DIAGNOSIS — Z85038 Personal history of other malignant neoplasm of large intestine: Secondary | ICD-10-CM | POA: Diagnosis not present

## 2021-09-18 DIAGNOSIS — E559 Vitamin D deficiency, unspecified: Secondary | ICD-10-CM | POA: Diagnosis not present

## 2021-09-18 DIAGNOSIS — I10 Essential (primary) hypertension: Secondary | ICD-10-CM | POA: Diagnosis not present

## 2021-09-18 DIAGNOSIS — K219 Gastro-esophageal reflux disease without esophagitis: Secondary | ICD-10-CM | POA: Diagnosis not present

## 2021-09-18 DIAGNOSIS — E782 Mixed hyperlipidemia: Secondary | ICD-10-CM | POA: Diagnosis not present

## 2021-09-18 DIAGNOSIS — R1010 Upper abdominal pain, unspecified: Secondary | ICD-10-CM | POA: Diagnosis not present

## 2021-09-18 DIAGNOSIS — E1169 Type 2 diabetes mellitus with other specified complication: Secondary | ICD-10-CM | POA: Diagnosis not present

## 2021-09-18 DIAGNOSIS — G4733 Obstructive sleep apnea (adult) (pediatric): Secondary | ICD-10-CM | POA: Diagnosis not present

## 2021-09-18 DIAGNOSIS — Z853 Personal history of malignant neoplasm of breast: Secondary | ICD-10-CM | POA: Diagnosis not present

## 2021-09-23 ENCOUNTER — Encounter (INDEPENDENT_AMBULATORY_CARE_PROVIDER_SITE_OTHER): Payer: Self-pay | Admitting: Physician Assistant

## 2021-09-23 ENCOUNTER — Ambulatory Visit (INDEPENDENT_AMBULATORY_CARE_PROVIDER_SITE_OTHER): Payer: Medicare Other | Admitting: Physician Assistant

## 2021-09-23 VITALS — BP 131/70 | HR 54 | Temp 98.0°F | Ht 64.0 in | Wt 231.0 lb

## 2021-09-23 DIAGNOSIS — E119 Type 2 diabetes mellitus without complications: Secondary | ICD-10-CM | POA: Diagnosis not present

## 2021-09-23 DIAGNOSIS — Z6841 Body Mass Index (BMI) 40.0 and over, adult: Secondary | ICD-10-CM | POA: Diagnosis not present

## 2021-09-23 DIAGNOSIS — E669 Obesity, unspecified: Secondary | ICD-10-CM

## 2021-09-24 NOTE — Progress Notes (Signed)
? ? ? ?Chief Complaint:  ? ?OBESITY ?[name] is here to discuss her progress with her obesity treatment plan along with follow-up of her obesity related diagnoses. [name] is on keeping a food journal and adhering to recommended goals of 1100-1200 calories and 80-85 grams of protein and states she is following her eating plan approximately 75% of the time. [name] states she is using the stationary bike for 15-20 minutes 2 times per week. ? ?Today's visit was #: 61 ?Starting weight: 259 lbs ?Starting date: 04/12/2018 ?Today's weight: 231 lbs ?Today's date: 09/23/2021 ?Total lbs lost to date: 28 lbs ?Total lbs lost since last in-office visit: 0 ? ?Interim History: [name] continues to have some abdominal pain despite being off of Metformin since last office visit. She is seeing a gastroenterologist provider in 10 days. She is averaging 1200 calories and 90 grams of protein daily (overeating her calories). She is eating out rather than at home more often.  ? ?Subjective:  ? ?1. Type 2 diabetes mellitus without complication, without long-term current use of insulin ([place]) ?[name] stopped Metformin since the last office visit. Her blood sugar averages 104-139. She denies hypoglycemia.  ? ?Assessment/Plan:  ? ?1. Type 2 diabetes mellitus without complication, without long-term current use of insulin ([place]) ?[name] will continue with plan. She will see gastroenterologist and then will consider restarting Metformin. Good blood sugar control is important to decrease the likelihood of diabetic complications such as nephropathy, neuropathy, limb loss, blindness, coronary artery disease, and death. Intensive lifestyle modification including diet, exercise and weight loss are the first line of treatment for diabetes.  ? ?2. Obesity: Current BMI 40.15 ?[name] is currently in the action stage of change. As such, her goal is to continue with weight loss efforts. She has agreed to keeping a food journal and adhering to  recommended goals of 1000-1100 calories and 80 grams of protein daily.  ? ?Exercise goals:  As is. ? ?Behavioral modification strategies: meal planning and cooking strategies and planning for success. ? ?[name] has agreed to follow-up with our clinic in 4 weeks. She was informed of the importance of frequent follow-up visits to maximize her success with intensive lifestyle modifications for her multiple health conditions.  ? ?Objective:  ? ?Blood pressure 131/70, pulse (!) 54, temperature 98 ?F (36.7 ?C), height '5\' 4"'$  (1.626 m), weight 231 lb (104.8 kg), SpO2 (!) 54 %. ?Body mass index is 39.65 kg/m?. ? ?General: Cooperative, alert, well developed, in no acute distress. ?HEENT: Conjunctivae and lids unremarkable. ?Cardiovascular: Regular rhythm.  ?Lungs: Normal work of breathing. ?Neurologic: No focal deficits.  ? ?Lab Results  ?Component Value Date  ? CREATININE 1.00 09/02/2021  ? BUN 31 (H) 09/02/2021  ? NA 141 09/02/2021  ? K 4.3 09/02/2021  ? CL 103 09/02/2021  ? CO2 23 09/02/2021  ? ?Lab Results  ?Component Value Date  ? ALT 12 09/02/2021  ? AST 12 09/02/2021  ? ALKPHOS 71 09/02/2021  ? BILITOT 0.3 09/02/2021  ? ?Lab Results  ?Component Value Date  ? HGBA1C 6.0 (H) 09/02/2021  ? HGBA1C 5.7 (H) 03/11/2021  ? HGBA1C 5.4 09/10/2020  ? HGBA1C 5.8 (H) 06/05/2020  ? HGBA1C 5.9 (H) 02/09/2020  ? ?Lab Results  ?Component Value Date  ? INSULIN 18.4 09/02/2021  ? INSULIN 18.4 03/11/2021  ? INSULIN 14.3 09/10/2020  ? INSULIN 14.5 06/05/2020  ? INSULIN 17.6 02/09/2020  ? ?Lab Results  ?Component Value Date  ? TSH 1.480 04/12/2018  ? ?Lab Results  ?Component Value  Date  ? CHOL 152 09/02/2021  ? HDL 70 09/02/2021  ? [place] 67 09/02/2021  ? TRIG 78 09/02/2021  ? CHOLHDL 2.2 09/02/2021  ? ?Lab Results  ?Component Value Date  ? VD25OH 72.1 09/02/2021  ? VD25OH 75.1 03/11/2021  ? VD25OH 66.1 09/10/2020  ? ?Lab Results  ?Component Value Date  ? WBC 7.5 09/10/2020  ? HGB 12.1 09/10/2020  ? HCT 36.8 09/10/2020  ? MCV 90  09/10/2020  ? PLT 273 09/10/2020  ? ?No results found for: IRON, TIBC, FERRITIN ? ?Obesity Behavioral Intervention:  ? ?Approximately 15 minutes were spent on the discussion below. ? ?ASK: ?We discussed the diagnosis of obesity with [name] today and [name] agreed to give [name] permission to discuss obesity behavioral modification therapy today. ? ?ASSESS: ?[name] has the diagnosis of obesity and her BMI today is 39.7. [name] is in the action stage of change.  ? ?ADVISE: ?[name] was educated on the multiple health risks of obesity as well as the benefit of weight loss to improve her health. She was advised of the need for long term treatment and the importance of lifestyle modifications to improve her current health and to decrease her risk of future health problems. ? ?AGREE: ?Multiple dietary modification options and treatment options were discussed and [name] agreed to follow the recommendations documented in the above note. ? ?ARRANGE: ?[name] was educated on the importance of frequent visits to treat obesity as outlined per CMS and USPSTF guidelines and agreed to schedule her next follow up appointment today. ? ?Attestation Statements:  ? ?Reviewed by clinician on day of visit: allergies, medications, problem list, medical history, surgical history, family history, social history, and previous encounter notes. ? ?I, Tonye Pearson, am acting as Location manager for [employer], PA-C. ? ?I have reviewed the above documentation for accuracy and completeness, and I agree with the above. Abby Potash, PA-C ? ?

## 2021-09-27 DIAGNOSIS — E782 Mixed hyperlipidemia: Secondary | ICD-10-CM | POA: Diagnosis not present

## 2021-09-27 DIAGNOSIS — I10 Essential (primary) hypertension: Secondary | ICD-10-CM | POA: Diagnosis not present

## 2021-09-27 DIAGNOSIS — K219 Gastro-esophageal reflux disease without esophagitis: Secondary | ICD-10-CM | POA: Diagnosis not present

## 2021-09-27 DIAGNOSIS — E1169 Type 2 diabetes mellitus with other specified complication: Secondary | ICD-10-CM | POA: Diagnosis not present

## 2021-10-03 DIAGNOSIS — R198 Other specified symptoms and signs involving the digestive system and abdomen: Secondary | ICD-10-CM | POA: Diagnosis not present

## 2021-10-03 DIAGNOSIS — R109 Unspecified abdominal pain: Secondary | ICD-10-CM | POA: Diagnosis not present

## 2021-10-23 ENCOUNTER — Ambulatory Visit (INDEPENDENT_AMBULATORY_CARE_PROVIDER_SITE_OTHER): Payer: Medicare Other | Admitting: Physician Assistant

## 2021-10-24 DIAGNOSIS — H43813 Vitreous degeneration, bilateral: Secondary | ICD-10-CM | POA: Diagnosis not present

## 2021-10-24 DIAGNOSIS — Z961 Presence of intraocular lens: Secondary | ICD-10-CM | POA: Diagnosis not present

## 2021-10-24 DIAGNOSIS — Z9849 Cataract extraction status, unspecified eye: Secondary | ICD-10-CM | POA: Diagnosis not present

## 2021-10-24 DIAGNOSIS — H43393 Other vitreous opacities, bilateral: Secondary | ICD-10-CM | POA: Diagnosis not present

## 2021-10-24 DIAGNOSIS — H35033 Hypertensive retinopathy, bilateral: Secondary | ICD-10-CM | POA: Diagnosis not present

## 2021-10-24 DIAGNOSIS — H1045 Other chronic allergic conjunctivitis: Secondary | ICD-10-CM | POA: Diagnosis not present

## 2021-10-24 DIAGNOSIS — H26491 Other secondary cataract, right eye: Secondary | ICD-10-CM | POA: Diagnosis not present

## 2021-10-24 DIAGNOSIS — H524 Presbyopia: Secondary | ICD-10-CM | POA: Diagnosis not present

## 2021-10-30 ENCOUNTER — Ambulatory Visit (INDEPENDENT_AMBULATORY_CARE_PROVIDER_SITE_OTHER): Payer: Medicare Other | Admitting: Family Medicine

## 2021-10-30 ENCOUNTER — Encounter (INDEPENDENT_AMBULATORY_CARE_PROVIDER_SITE_OTHER): Payer: Self-pay | Admitting: Family Medicine

## 2021-10-30 VITALS — BP 140/82 | HR 56 | Temp 97.5°F | Ht 64.0 in | Wt 233.0 lb

## 2021-10-30 DIAGNOSIS — E1159 Type 2 diabetes mellitus with other circulatory complications: Secondary | ICD-10-CM | POA: Diagnosis not present

## 2021-10-30 DIAGNOSIS — E559 Vitamin D deficiency, unspecified: Secondary | ICD-10-CM | POA: Diagnosis not present

## 2021-10-30 DIAGNOSIS — E1169 Type 2 diabetes mellitus with other specified complication: Secondary | ICD-10-CM | POA: Diagnosis not present

## 2021-10-30 DIAGNOSIS — I152 Hypertension secondary to endocrine disorders: Secondary | ICD-10-CM | POA: Diagnosis not present

## 2021-10-30 DIAGNOSIS — E669 Obesity, unspecified: Secondary | ICD-10-CM

## 2021-10-30 DIAGNOSIS — Z6841 Body Mass Index (BMI) 40.0 and over, adult: Secondary | ICD-10-CM

## 2021-11-08 NOTE — Progress Notes (Unsigned)
Chief Complaint:   Kathleen [name] is here to discuss her progress with her obesity treatment plan along with follow-up of her obesity related diagnoses. Kathleen [name] is on keeping a food journal and adhering to recommended goals of 1000-1100 calories and 80 protein and states she is following her eating plan approximately 60% of the time. Kathleen [name] states she is bike cycling 15 minutes 3-4 times per week.  Today's visit was #: 16 Starting weight: 259 lbs Starting date: 04/12/2018 Today's weight: 233 lbs Today's date: 10/30/2021 Total lbs lost to date: 26 lbs Total lbs lost since last in-office visit: 0 lbs  Interim History: Kathleen [name] last saw Diannia Ruder, PA-C. Kathleen [name] reports having a GI viral illness for 3-4 days.  She had gone to the beach for one week. She is happy that she did not gain too much weight.  Subjective:   1. Type 2 diabetes mellitus with other specified complication, without long-term current use of insulin ([place]) Sheriann's PCP stopped her Metformin on 09/02/2021. She did not tolerate Ozempic too well and it is expensive. Her blood sugars have been stable and well controlled with mediations.  2. Hypertension associated with type 2 diabetes mellitus ([place]) Kathleen [name] is currently taking Zebeta, Diovan, and Chlorthalidone.  3. Vitamin D deficiency Kathleen [name] B Sanna is tolerating medication(s) well without side effects.  Medication compliance is good as patient endorses taking it as prescribed.  The patient denies additional concerns regarding this condition.      Assessment/Plan:  No orders of the defined types were placed in this encounter.   There are no discontinued medications.   No orders of the defined types were placed in this encounter.    1. Type 2 diabetes mellitus with other specified complication, without long-term current use of insulin ([facility]) Good blood sugar control is important to decrease the likelihood of diabetic complications such as  nephropathy, neuropathy, limb loss, blindness, coronary artery disease, and death. Intensive lifestyle modification including diet, exercise and weight loss are the first line of treatment for diabetes.   Kathleen [name] will continue to see her PCP and follow up with her PCP for chronic care as well.  2. Hypertension associated with type 2 diabetes mellitus ([place]) Melanie's hypertension is at goal. She will continue taking her medications per her PCP/Specialist.  3. Vitamin D deficiency Low Vitamin D level contributes to fatigue and are associated with obesity, breast, and colon cancer. She agrees to continue to take prescription Vitamin D '@50'$ ,000 IU every week and will follow-up for routine testing of Vitamin D, at least 2-3 times per year to avoid over-replacement.Low Vitamin D level contributes to fatigue and are associated with obesity, breast, and colon cancer. She agrees to continue to take prescription Vitamin D '@50'$ ,000 IU every week and will follow-up for routine testing of Vitamin D, at least 2-3 times per year to avoid over-replacement.   Kathleen [name] declines the need for a refill of Vitamin D today.  4. Obesity, Current BMI 40.1 Kathleen [name] is currently in the action stage of change. As such, her goal is to continue with weight loss efforts. She has agreed to keeping a food journal and adhering to recommended goals of [phone] calories and 85 protein.   Kathleen [name] will bring her journaling log with her to the next office visit.  Exercise goals: As is.  Behavioral modification strategies: planning for success.  Kathleen [name] has agreed to follow-up with our clinic in 3 weeks. She was informed of the importance of frequent follow-up visits to  maximize her success with intensive lifestyle modifications for her multiple health conditions.   Objective:   Blood pressure 140/82, pulse (!) 56, temperature (!) 97.5 F (36.4 C), height '5\' 4"'$  (1.626 m), weight 233 lb (105.7 kg), SpO2 96 %. Body mass index is  39.99 kg/m.  General: Cooperative, alert, well developed, in no acute distress. HEENT: Conjunctivae and lids unremarkable. Cardiovascular: Regular rhythm.  Lungs: Normal work of breathing. Neurologic: No focal deficits.   Lab Results  Component Value Date   CREATININE 1.00 09/02/2021   BUN 31 (H) 09/02/2021   NA 141 09/02/2021   K 4.3 09/02/2021   CL 103 09/02/2021   CO2 23 09/02/2021   Lab Results  Component Value Date   ALT 12 09/02/2021   AST 12 09/02/2021   ALKPHOS 71 09/02/2021   BILITOT 0.3 09/02/2021   Lab Results  Component Value Date   HGBA1C 6.0 (H) 09/02/2021   HGBA1C 5.7 (H) 03/11/2021   HGBA1C 5.4 09/10/2020   HGBA1C 5.8 (H) 06/05/2020   HGBA1C 5.9 (H) 02/09/2020   Lab Results  Component Value Date   INSULIN 18.4 09/02/2021   INSULIN 18.4 03/11/2021   INSULIN 14.3 09/10/2020   INSULIN 14.5 06/05/2020   INSULIN 17.6 02/09/2020   Lab Results  Component Value Date   TSH 1.480 04/12/2018   Lab Results  Component Value Date   CHOL 152 09/02/2021   HDL 70 09/02/2021   LDLCALC 67 09/02/2021   TRIG 78 09/02/2021   CHOLHDL 2.2 09/02/2021   Lab Results  Component Value Date   VD25OH 72.1 09/02/2021   VD25OH 75.1 03/11/2021   VD25OH 66.1 09/10/2020   Lab Results  Component Value Date   WBC 7.5 09/10/2020   HGB 12.1 09/10/2020   HCT 36.8 09/10/2020   MCV 90 09/10/2020   PLT 273 09/10/2020   No results found for: IRON, TIBC, FERRITIN  Obesity Behavioral Intervention:   Approximately 15 minutes were spent on the discussion below.  ASK: We discussed the diagnosis of obesity with Kathleen [name] today and Kathleen [name] agreed to give [name] permission to discuss obesity behavioral modification therapy today.  ASSESS: [name] has the diagnosis of obesity and her BMI today is 40.1. Kathleen [name] is in the action stage of change.   ADVISE: [name] was educated on the multiple health risks of obesity as well as the benefit of weight loss to improve her health.  She was advised of the need for long term treatment and the importance of lifestyle modifications to improve her current health and to decrease her risk of future health problems.  AGREE: Multiple dietary modification options and treatment options were discussed and Kathleen [name] agreed to follow the recommendations documented in the above note.  ARRANGE: Kathleen [name] was educated on the importance of frequent visits to treat obesity as outlined per CMS and USPSTF guidelines and agreed to schedule her next follow up appointment today.  Attestation Statements:   Reviewed by clinician on day of visit: allergies, medications, problem list, medical history, surgical history, family history, social history, and previous encounter notes.  ILennette Bihari, CMA, am acting as transcriptionist for Dr. Raliegh Scarlet, DO  I have reviewed the above documentation for accuracy and completeness, and I agree with the above. Marjory Sneddon, D.O.  The [place] was signed into law in 2016 which includes the topic of electronic health records.  This provides immediate access to information in MyChart.  This includes consultation notes, operative notes, office notes, lab  results and pathology reports.  If you have any questions about what you read please let us know at your next visit so we can discuss your concerns and take corrective action if need be.  We are right here with you.

## 2021-11-20 ENCOUNTER — Ambulatory Visit (INDEPENDENT_AMBULATORY_CARE_PROVIDER_SITE_OTHER): Payer: Medicare Other | Admitting: Nurse Practitioner

## 2021-11-20 ENCOUNTER — Encounter (INDEPENDENT_AMBULATORY_CARE_PROVIDER_SITE_OTHER): Payer: Self-pay | Admitting: Nurse Practitioner

## 2021-11-20 VITALS — BP 138/70 | HR 54 | Temp 98.0°F | Ht 64.0 in | Wt 230.0 lb

## 2021-11-20 DIAGNOSIS — E1169 Type 2 diabetes mellitus with other specified complication: Secondary | ICD-10-CM

## 2021-11-20 DIAGNOSIS — E669 Obesity, unspecified: Secondary | ICD-10-CM | POA: Diagnosis not present

## 2021-11-20 DIAGNOSIS — Z6839 Body mass index (BMI) 39.0-39.9, adult: Secondary | ICD-10-CM | POA: Diagnosis not present

## 2021-11-20 DIAGNOSIS — K760 Fatty (change of) liver, not elsewhere classified: Secondary | ICD-10-CM | POA: Diagnosis not present

## 2021-11-20 DIAGNOSIS — E66813 Obesity, class 3: Secondary | ICD-10-CM

## 2021-11-21 NOTE — Progress Notes (Unsigned)
Chief Complaint:   Kathleen [name] is here to discuss her progress with her obesity treatment plan along with follow-up of her obesity related diagnoses. Kathleen [name] is on keeping a food journal and adhering to recommended goals of 1100 calories and 85 plus grams of  protein and states she is following her eating plan approximately 90% of the time. Kathleen [name] states she is using a stationary bike for 15 minutes 2-3 times per week.  Today's visit was #: 2 Starting weight: 259 lbs Starting date: 04/12/2018 Today's weight: 230 lbs Today's date: 11/20/2021 Total lbs lost to date: 29 lbs Total lbs lost since last in-office visit: 3 lbs  Interim History: Kathleen [name] has done well since her last visit. Her highest weight was 283 lbs in 2017. She is struggling with increased hunger and cravings since stopping Mounjaro. She is averaging around [phone] calories and 80 plus grams of protein. She is working on Designer, fashion/clothing intake.   Subjective:   1. Type 2 diabetes mellitus with other specified complication, without long-term current use of insulin ([place]) Kathleen [name] stopped Metformin 09/02/2021 because she wanted to see how she would do off of it. She took Ozempic in the past and stopped due to cost and side effects. She is struggling with hunger and cravings since stopping Metformin. Her fasting blood sugar was in the range of 81-138. Her last A1C ws 6.0 on 09/02/2021.  2. Fatty liver Kathleen [name] had a CT Scan on 08/08/2021 and abdomen ultrasound on 10/05/2020.  Assessment/Plan:   1. Type 2 diabetes mellitus with other specified complication, without long-term current use of insulin ([place]) Kathleen [name] agrees to restart Metformin 500 mg at breakfast. We discussed side effects. She states it helped to control cravings and hunger. Good blood sugar control is important to decrease the likelihood of diabetic complications such as nephropathy, neuropathy, limb loss, blindness, coronary artery disease, and death.  Intensive lifestyle modification including diet, exercise and weight loss are the first line of treatment for diabetes.   2. Fatty liver We discussed the likely diagnosis of non-alcoholic fatty liver disease today and how this condition is obesity related. Kathleen [name] was educated the importance of weight loss. Kathleen [name] agreed to continue with her weight loss efforts with healthier diet and exercise as an essential part of her treatment plan.   3. Obesity, Current BMI 39.5 Kathleen [name] is currently in the action stage of change. As such, her goal is to continue with weight loss efforts. She has agreed to keeping a food journal and adhering to recommended goals of 1000-1100 calories and 80 grams of  protein.   Exercise goals:  As is.   Behavioral modification strategies: increasing water intake, no skipping meals, and planning for success.  Kathleen [name] has agreed to follow-up with our clinic in 3 weeks. She was informed of the importance of frequent follow-up visits to maximize her success with intensive lifestyle modifications for her multiple health conditions.   Objective:   Blood pressure 138/70, pulse (!) 54, temperature 98 F (36.7 C), height '5\' 4"'$  (1.626 m), weight 230 lb (104.3 kg), SpO2 99 %. Body mass index is 39.48 kg/m.  General: Cooperative, alert, well developed, in no acute distress. HEENT: Conjunctivae and lids unremarkable. Cardiovascular: Regular rhythm.  Lungs: Normal work of breathing. Neurologic: No focal deficits.   Lab Results  Component Value Date   CREATININE 1.00 09/02/2021   BUN 31 (H) 09/02/2021   NA 141 09/02/2021   K 4.3 09/02/2021   CL 103 09/02/2021  CO2 23 09/02/2021   Lab Results  Component Value Date   ALT 12 09/02/2021   AST 12 09/02/2021   ALKPHOS 71 09/02/2021   BILITOT 0.3 09/02/2021   Lab Results  Component Value Date   HGBA1C 6.0 (H) 09/02/2021   HGBA1C 5.7 (H) 03/11/2021   HGBA1C 5.4 09/10/2020   HGBA1C 5.8 (H) 06/05/2020   HGBA1C 5.9  (H) 02/09/2020   Lab Results  Component Value Date   INSULIN 18.4 09/02/2021   INSULIN 18.4 03/11/2021   INSULIN 14.3 09/10/2020   INSULIN 14.5 06/05/2020   INSULIN 17.6 02/09/2020   Lab Results  Component Value Date   TSH 1.480 04/12/2018   Lab Results  Component Value Date   CHOL 152 09/02/2021   HDL 70 09/02/2021   LDLCALC 67 09/02/2021   TRIG 78 09/02/2021   CHOLHDL 2.2 09/02/2021   Lab Results  Component Value Date   VD25OH 72.1 09/02/2021   VD25OH 75.1 03/11/2021   VD25OH 66.1 09/10/2020   Lab Results  Component Value Date   WBC 7.5 09/10/2020   HGB 12.1 09/10/2020   HCT 36.8 09/10/2020   MCV 90 09/10/2020   PLT 273 09/10/2020   No results found for: "IRON", "TIBC", "FERRITIN"  Attestation Statements:   Reviewed by clinician on day of visit: allergies, medications, problem list, medical history, surgical history, family history, social history, and previous encounter notes.  Time spent on visit including pre-visit chart review and post-visit care and charting was 30 minutes.   I, Lizbeth Bark, RMA, am acting as Location manager for Everardo Pacific, FNP.   I have reviewed the above documentation for accuracy and completeness, and I agree with the above. -  ***

## 2021-11-29 DIAGNOSIS — E1169 Type 2 diabetes mellitus with other specified complication: Secondary | ICD-10-CM | POA: Diagnosis not present

## 2021-11-29 DIAGNOSIS — E782 Mixed hyperlipidemia: Secondary | ICD-10-CM | POA: Diagnosis not present

## 2021-11-29 DIAGNOSIS — I10 Essential (primary) hypertension: Secondary | ICD-10-CM | POA: Diagnosis not present

## 2021-11-29 DIAGNOSIS — K219 Gastro-esophageal reflux disease without esophagitis: Secondary | ICD-10-CM | POA: Diagnosis not present

## 2021-12-11 DIAGNOSIS — G4733 Obstructive sleep apnea (adult) (pediatric): Secondary | ICD-10-CM | POA: Diagnosis not present

## 2021-12-12 ENCOUNTER — Encounter (INDEPENDENT_AMBULATORY_CARE_PROVIDER_SITE_OTHER): Payer: Self-pay | Admitting: Nurse Practitioner

## 2021-12-12 ENCOUNTER — Ambulatory Visit (INDEPENDENT_AMBULATORY_CARE_PROVIDER_SITE_OTHER): Payer: Medicare Other | Admitting: Nurse Practitioner

## 2021-12-12 VITALS — BP 146/69 | HR 52 | Temp 98.0°F | Ht 64.0 in | Wt 233.0 lb

## 2021-12-12 DIAGNOSIS — E1159 Type 2 diabetes mellitus with other circulatory complications: Secondary | ICD-10-CM | POA: Diagnosis not present

## 2021-12-12 DIAGNOSIS — Z7984 Long term (current) use of oral hypoglycemic drugs: Secondary | ICD-10-CM | POA: Diagnosis not present

## 2021-12-12 DIAGNOSIS — E669 Obesity, unspecified: Secondary | ICD-10-CM

## 2021-12-12 DIAGNOSIS — I152 Hypertension secondary to endocrine disorders: Secondary | ICD-10-CM

## 2021-12-12 DIAGNOSIS — E1169 Type 2 diabetes mellitus with other specified complication: Secondary | ICD-10-CM | POA: Diagnosis not present

## 2021-12-12 DIAGNOSIS — Z6841 Body Mass Index (BMI) 40.0 and over, adult: Secondary | ICD-10-CM

## 2021-12-12 DIAGNOSIS — G4733 Obstructive sleep apnea (adult) (pediatric): Secondary | ICD-10-CM

## 2021-12-16 NOTE — Progress Notes (Signed)
Chief Complaint:   [place] is here to discuss her progress with her obesity treatment plan along with follow-up of her obesity related diagnoses. Kathleen [name] is on keeping a food journal and adhering to recommended goals of 1100 calories and 85+ grams of protein and states she is following her eating plan approximately 60-70% of the time. Kathleen [name] states she is stationary bike 15 minutes 2 times per week.  Today's visit was #: 54 Starting weight: 259 lbs Starting date: 04/12/2018 Today's weight: 233 lbs Today's date: 12/12/2021 Total lbs lost to date: 26 lbs Total lbs lost since last in-office visit: 0  Interim History: Kathleen [name] has had some celebrations since her last visit and spent sometime with her grandchildren. Calories:[phone], protein:59-121. She is going on vacation prior to next visit. She's working on increasing her water intake. Drinking unsweetened tea and diet soda. Her highest weight was 283 lbs.  Subjective:   1. Type 2 diabetes mellitus with other specified complication, without long-term current use of insulin ([place]) Kathleen [name] is taking Metformin 500 mg daily. Reports some side effects of diarrhea. Fasting blood sugar's 113-128. Her last A1c 6.0. Denies hypoglycemia.  2. Hypertension associated with type 2 diabetes mellitus ([place]) Kathleen [name]'s blood pressure was 122/51 at home today, 116/72 at home yesterday. She is taking [place] 50 mg, Diovan 80 mg twice a day, Bisoprolol 5 mg. denies any chest pain,shortness of breath or palpitations.  3. Obstructive sleep apnea syndrome Kathleen [name] is wearing a CPAP nightly without any problems.   Assessment/Plan:   1. Type 2 diabetes mellitus with other specified complication, without long-term current use of insulin [facility]) Kathleen [name] will continue medications as directed. She is seeing PCP next week for a follow up and labs.  Good blood sugar control is important to decrease the likelihood of diabetic complications such as  nephropathy, neuropathy, limb loss, blindness, coronary artery disease, and death. Intensive lifestyle modification including diet, exercise and weight loss are the first line of treatment for diabetes.    2. Hypertension associated with type 2 diabetes mellitus ([place]) Kathleen [name] is to discuss with PCP next week and continue medications as directed.  Kathleen [name] is working on healthy weight loss and exercise to improve blood pressure control. We will watch for signs of hypotension as she continues her lifestyle modifications.   3. Obstructive sleep apnea syndrome Continue CPAP nightly.  Intensive lifestyle modifications are the first line treatment for this issue. We discussed several lifestyle modifications today and she will continue to work on diet, exercise and weight loss efforts. We will continue to monitor. Orders and follow up as documented in patient record.    4. Obesity, Current BMI 40.0 Kathleen [name] is currently in the action stage of change. As such, her goal is to continue with weight loss efforts. She has agreed to keeping a food journal and adhering to recommended goals of 1100 calories and 85+ grams of protein.   Handout given: Dining out guide. Kathleen [name] will see her PCP next week for follow up and labs.  Exercise goals: As is.  Behavioral modification strategies: increasing water intake, no skipping meals, and planning for success.  Kathleen [name] has agreed to follow-up with our clinic in 3 weeks. She was informed of the importance of frequent follow-up visits to maximize her success with intensive lifestyle modifications for her multiple health conditions.   Objective:   Blood pressure (!) 146/69, pulse (!) 52, temperature 98 F (36.7 C), height '5\' 4"'$  (1.626 m), weight 233 lb (105.7 kg), SpO2  96 %. Body mass index is 39.99 kg/m.  General: Cooperative, alert, well developed, in no acute distress. HEENT: Conjunctivae and lids unremarkable. Cardiovascular: Regular rhythm.  Lungs:  Normal work of breathing. Neurologic: No focal deficits.   Lab Results  Component Value Date   CREATININE 1.00 09/02/2021   BUN 31 (H) 09/02/2021   NA 141 09/02/2021   K 4.3 09/02/2021   CL 103 09/02/2021   CO2 23 09/02/2021   Lab Results  Component Value Date   ALT 12 09/02/2021   AST 12 09/02/2021   ALKPHOS 71 09/02/2021   BILITOT 0.3 09/02/2021   Lab Results  Component Value Date   HGBA1C 6.0 (H) 09/02/2021   HGBA1C 5.7 (H) 03/11/2021   HGBA1C 5.4 09/10/2020   HGBA1C 5.8 (H) 06/05/2020   HGBA1C 5.9 (H) 02/09/2020   Lab Results  Component Value Date   INSULIN 18.4 09/02/2021   INSULIN 18.4 03/11/2021   INSULIN 14.3 09/10/2020   INSULIN 14.5 06/05/2020   INSULIN 17.6 02/09/2020   Lab Results  Component Value Date   TSH 1.480 04/12/2018   Lab Results  Component Value Date   CHOL 152 09/02/2021   HDL 70 09/02/2021   LDLCALC 67 09/02/2021   TRIG 78 09/02/2021   CHOLHDL 2.2 09/02/2021   Lab Results  Component Value Date   VD25OH 72.1 09/02/2021   VD25OH 75.1 03/11/2021   VD25OH 66.1 09/10/2020   Lab Results  Component Value Date   WBC 7.5 09/10/2020   HGB 12.1 09/10/2020   HCT 36.8 09/10/2020   MCV 90 09/10/2020   PLT 273 09/10/2020   No results found for: "IRON", "TIBC", "FERRITIN"  Attestation Statements:   Reviewed by clinician on day of visit: allergies, medications, problem list, medical history, surgical history, family history, social history, and previous encounter notes.  Time spent on visit including pre-visit chart review and post-visit care and charting was 30 minutes.   I, Brendell Tyus, RMA, am acting as transcriptionist for Everardo Pacific, FNP.  I have reviewed the above documentation for accuracy and completeness, and I agree with the above. Everardo Pacific, FNP

## 2021-12-18 DIAGNOSIS — Z85038 Personal history of other malignant neoplasm of large intestine: Secondary | ICD-10-CM | POA: Diagnosis not present

## 2021-12-18 DIAGNOSIS — I10 Essential (primary) hypertension: Secondary | ICD-10-CM | POA: Diagnosis not present

## 2021-12-18 DIAGNOSIS — H6991 Unspecified Eustachian tube disorder, right ear: Secondary | ICD-10-CM | POA: Diagnosis not present

## 2021-12-18 DIAGNOSIS — E782 Mixed hyperlipidemia: Secondary | ICD-10-CM | POA: Diagnosis not present

## 2021-12-18 DIAGNOSIS — E1169 Type 2 diabetes mellitus with other specified complication: Secondary | ICD-10-CM | POA: Diagnosis not present

## 2021-12-18 DIAGNOSIS — Z0000 Encounter for general adult medical examination without abnormal findings: Secondary | ICD-10-CM | POA: Diagnosis not present

## 2021-12-18 DIAGNOSIS — E559 Vitamin D deficiency, unspecified: Secondary | ICD-10-CM | POA: Diagnosis not present

## 2021-12-18 DIAGNOSIS — K219 Gastro-esophageal reflux disease without esophagitis: Secondary | ICD-10-CM | POA: Diagnosis not present

## 2021-12-18 DIAGNOSIS — G4733 Obstructive sleep apnea (adult) (pediatric): Secondary | ICD-10-CM | POA: Diagnosis not present

## 2021-12-19 ENCOUNTER — Other Ambulatory Visit: Payer: Self-pay | Admitting: Family Medicine

## 2021-12-19 DIAGNOSIS — Z1231 Encounter for screening mammogram for malignant neoplasm of breast: Secondary | ICD-10-CM

## 2022-01-02 ENCOUNTER — Ambulatory Visit (INDEPENDENT_AMBULATORY_CARE_PROVIDER_SITE_OTHER): Payer: Medicare Other | Admitting: Nurse Practitioner

## 2022-01-02 ENCOUNTER — Encounter (INDEPENDENT_AMBULATORY_CARE_PROVIDER_SITE_OTHER): Payer: Self-pay | Admitting: Nurse Practitioner

## 2022-01-02 VITALS — BP 139/80 | HR 50 | Temp 97.9°F | Ht 64.0 in | Wt 232.0 lb

## 2022-01-02 DIAGNOSIS — Z7984 Long term (current) use of oral hypoglycemic drugs: Secondary | ICD-10-CM

## 2022-01-02 DIAGNOSIS — I152 Hypertension secondary to endocrine disorders: Secondary | ICD-10-CM | POA: Diagnosis not present

## 2022-01-02 DIAGNOSIS — E1169 Type 2 diabetes mellitus with other specified complication: Secondary | ICD-10-CM

## 2022-01-02 DIAGNOSIS — E1159 Type 2 diabetes mellitus with other circulatory complications: Secondary | ICD-10-CM | POA: Diagnosis not present

## 2022-01-02 DIAGNOSIS — E669 Obesity, unspecified: Secondary | ICD-10-CM | POA: Diagnosis not present

## 2022-01-02 DIAGNOSIS — Z6839 Body mass index (BMI) 39.0-39.9, adult: Secondary | ICD-10-CM

## 2022-01-06 DIAGNOSIS — R109 Unspecified abdominal pain: Secondary | ICD-10-CM | POA: Diagnosis not present

## 2022-01-06 DIAGNOSIS — R198 Other specified symptoms and signs involving the digestive system and abdomen: Secondary | ICD-10-CM | POA: Diagnosis not present

## 2022-01-06 NOTE — Progress Notes (Signed)
Chief Complaint:   [name] is here to discuss her progress with her obesity treatment plan along with follow-up of her obesity related diagnoses. Kathleen [name] is on keeping a food journal and adhering to recommended goals of 1100 calories and 85 grams of protein and states she is following her eating plan approximately 60% of the time. Kathleen [name] states she is walking 10 minutes 5 times per week.  Today's visit was #: 27 Starting weight: 259 lbs Starting date: 04/12/2018 Today's weight: 232 lbs Today's date: 01/02/2022 Total lbs lost to date: 27 lbs Total lbs lost since last in-office visit: 1  Interim History: Kathleen [name] was on vacation last week. She hiked and walked a lot on vacation. Calories:1005-1170, protein: 50-104. She has been eating more vegetables. She has a family reunion coming up. Has increased water intake and decreased sodas to 2 per week. Her highest weight was 283 lbs.   Subjective:   1. Type 2 diabetes mellitus with other specified complication, without long-term current use of insulin ([place]) Kathleen [name] is taking Metformin 500 mg daily. Rare side effect of diarrhea. Seeing GI on Monday. Fasting blood sugars 109-137 ( high after eating ice cream while on vacation). Blood sugars late afternoon---before dinner 92-121. Last A1c 5.8 on 12/18/21.  2. Hypertension associated with type 2 diabetes mellitus ([place]) Kathleen [name]'s blood pressure's at home 110-128/53-80. denies any chest pain,shortness of breath or palpitations.  Assessment/Plan:   1. Type 2 diabetes mellitus with other specified complication, without long-term current use of insulin ([facility]) Continue to follow up with PCP and continue medications as directed.  Good blood sugar control is important to decrease the likelihood of diabetic complications such as nephropathy, neuropathy, limb loss, blindness, coronary artery disease, and death. Intensive lifestyle modification including diet, exercise and weight loss are the  first line of treatment for diabetes.    2. Hypertension associated with type 2 diabetes mellitus ([place]) Continue to follow up with PCP and continue medications as directed.  Kathleen [name] is working on healthy weight loss and exercise to improve blood pressure control. We will watch for signs of hypotension as she continues her lifestyle modifications.   3. Obesity, Current BMI 39.8 Kathleen [name] is currently in the action stage of change. As such, her goal is to continue with weight loss efforts. She has agreed to keeping a food journal and adhering to recommended goals of 1100 calories and 85 grams of protein.   Exercise goals: As is.  Behavioral modification strategies: increasing lean protein intake, increasing water intake, and no skipping meals.  Kathleen [name] has agreed to follow-up with our clinic in 3 weeks. She was informed of the importance of frequent follow-up visits to maximize her success with intensive lifestyle modifications for her multiple health conditions.   Objective:   Blood pressure 139/80, pulse (!) 50, temperature 97.9 F (36.6 C), height '5\' 4"'$  (1.626 m), weight 232 lb (105.2 kg), SpO2 98 %. Body mass index is 39.82 kg/m.  General: Cooperative, alert, well developed, in no acute distress. HEENT: Conjunctivae and lids unremarkable. Cardiovascular: Regular rhythm.  Lungs: Normal work of breathing. Neurologic: No focal deficits.   Lab Results  Component Value Date   CREATININE 1.00 09/02/2021   BUN 31 (H) 09/02/2021   NA 141 09/02/2021   K 4.3 09/02/2021   CL 103 09/02/2021   CO2 23 09/02/2021   Lab Results  Component Value Date   ALT 12 09/02/2021   AST 12 09/02/2021   ALKPHOS 71 09/02/2021   BILITOT  0.3 09/02/2021   Lab Results  Component Value Date   HGBA1C 6.0 (H) 09/02/2021   HGBA1C 5.7 (H) 03/11/2021   HGBA1C 5.4 09/10/2020   HGBA1C 5.8 (H) 06/05/2020   HGBA1C 5.9 (H) 02/09/2020   Lab Results  Component Value Date   INSULIN 18.4 09/02/2021    INSULIN 18.4 03/11/2021   INSULIN 14.3 09/10/2020   INSULIN 14.5 06/05/2020   INSULIN 17.6 02/09/2020   Lab Results  Component Value Date   TSH 1.480 04/12/2018   Lab Results  Component Value Date   CHOL 152 09/02/2021   HDL 70 09/02/2021   LDLCALC 67 09/02/2021   TRIG 78 09/02/2021   CHOLHDL 2.2 09/02/2021   Lab Results  Component Value Date   VD25OH 72.1 09/02/2021   VD25OH 75.1 03/11/2021   VD25OH 66.1 09/10/2020   Lab Results  Component Value Date   WBC 7.5 09/10/2020   HGB 12.1 09/10/2020   HCT 36.8 09/10/2020   MCV 90 09/10/2020   PLT 273 09/10/2020   No results found for: "IRON", "TIBC", "FERRITIN"  Attestation Statements:   Reviewed by clinician on day of visit: allergies, medications, problem list, medical history, surgical history, family history, social history, and previous encounter notes.  Spent 30 mins with the patient and reviewing chart before and after patient's visit.    I, Brendell Tyus, RMA, am acting as transcriptionist for Everardo Pacific, FNP.  I have reviewed the above documentation for accuracy and completeness, and I agree with the above. Everardo Pacific, FNP

## 2022-01-15 DIAGNOSIS — G4733 Obstructive sleep apnea (adult) (pediatric): Secondary | ICD-10-CM | POA: Diagnosis not present

## 2022-01-16 ENCOUNTER — Ambulatory Visit: Payer: Medicare Other

## 2022-01-22 ENCOUNTER — Ambulatory Visit
Admission: RE | Admit: 2022-01-22 | Discharge: 2022-01-22 | Disposition: A | Discharge status: Home / Self Care | Payer: Medicare Other | Source: Ambulatory Visit | Attending: Family Medicine | Admitting: Family Medicine

## 2022-01-22 ENCOUNTER — Encounter (INDEPENDENT_AMBULATORY_CARE_PROVIDER_SITE_OTHER): Payer: Self-pay

## 2022-01-22 DIAGNOSIS — Z1231 Encounter for screening mammogram for malignant neoplasm of breast: Secondary | ICD-10-CM | POA: Diagnosis not present

## 2022-01-23 ENCOUNTER — Other Ambulatory Visit: Payer: Self-pay | Admitting: Family Medicine

## 2022-01-23 DIAGNOSIS — R928 Other abnormal and inconclusive findings on diagnostic imaging of breast: Secondary | ICD-10-CM

## 2022-01-27 ENCOUNTER — Encounter (INDEPENDENT_AMBULATORY_CARE_PROVIDER_SITE_OTHER): Payer: Self-pay | Admitting: Family Medicine

## 2022-01-27 ENCOUNTER — Ambulatory Visit (INDEPENDENT_AMBULATORY_CARE_PROVIDER_SITE_OTHER): Payer: Medicare Other | Admitting: Family Medicine

## 2022-01-27 VITALS — BP 166/72 | HR 60 | Temp 98.2°F | Ht 64.0 in | Wt 235.0 lb

## 2022-01-27 DIAGNOSIS — E88819 Insulin resistance, unspecified: Secondary | ICD-10-CM

## 2022-01-27 DIAGNOSIS — G4733 Obstructive sleep apnea (adult) (pediatric): Secondary | ICD-10-CM | POA: Diagnosis not present

## 2022-01-27 DIAGNOSIS — I10 Essential (primary) hypertension: Secondary | ICD-10-CM

## 2022-01-27 DIAGNOSIS — Z6841 Body Mass Index (BMI) 40.0 and over, adult: Secondary | ICD-10-CM

## 2022-01-27 DIAGNOSIS — Z7984 Long term (current) use of oral hypoglycemic drugs: Secondary | ICD-10-CM

## 2022-01-27 DIAGNOSIS — R7303 Prediabetes: Secondary | ICD-10-CM

## 2022-01-27 DIAGNOSIS — Z9989 Dependence on other enabling machines and devices: Secondary | ICD-10-CM | POA: Diagnosis not present

## 2022-01-27 DIAGNOSIS — E66813 Obesity, class 3: Secondary | ICD-10-CM

## 2022-01-27 DIAGNOSIS — E8881 Metabolic syndrome: Secondary | ICD-10-CM

## 2022-01-27 DIAGNOSIS — E669 Obesity, unspecified: Secondary | ICD-10-CM

## 2022-01-27 MED ORDER — METFORMIN HCL 500 MG PO TABS: 500.0000 mg | ORAL_TABLET | Freq: Every day | ORAL | 0 refills | Status: DC

## 2022-02-03 ENCOUNTER — Ambulatory Visit
Admission: RE | Admit: 2022-02-03 | Discharge: 2022-02-03 | Disposition: A | Discharge status: Home / Self Care | Payer: Medicare Other | Source: Ambulatory Visit | Attending: Family Medicine | Admitting: Family Medicine

## 2022-02-03 DIAGNOSIS — N6489 Other specified disorders of breast: Secondary | ICD-10-CM | POA: Diagnosis not present

## 2022-02-03 DIAGNOSIS — R928 Other abnormal and inconclusive findings on diagnostic imaging of breast: Secondary | ICD-10-CM

## 2022-02-03 DIAGNOSIS — R922 Inconclusive mammogram: Secondary | ICD-10-CM | POA: Diagnosis not present

## 2022-02-04 NOTE — Progress Notes (Unsigned)
Chief Complaint:   Kathleen [name] is here to discuss her progress with her obesity treatment plan along with follow-up of her obesity related diagnoses. Kathleen [name] is on {MWMwtlossportion/plan2:23431} and states she is following her eating plan approximately ***% of the time. Kathleen [name] states she is *** *** minutes *** times per week.  Today's visit was #: *** Starting weight: *** Starting date: *** Today's weight: *** Today's date: 01/27/2022 Total lbs lost to date: *** Total lbs lost since last in-office visit: ***  Interim History: ***  Subjective:   1. Prediabetes ***  2. Insulin resistance ***  3. Essential hypertension ***  4. OSA on CPAP ***  Assessment/Plan:   1. Prediabetes *** - metFORMIN (GLUCOPHAGE) 500 MG tablet; Take 1 tablet (500 mg total) by mouth daily with breakfast. Taking one time a day at breakfast.  Dispense: 30 tablet; Refill: 0  2. Insulin resistance ***  3. Essential hypertension ***  4. OSA on CPAP ***  5. Obesity, Current BMI 40.4 Kathleen [name] is currently in the action stage of change. As such, her goal is to continue with weight loss efforts. She has agreed to keeping a food journal and adhering to recommended goals of 1100 calories and 85 grams of protein daily.   Exercise goals: Increase exercise-walk or gym 3 times per week.   Behavioral modification strategies: increasing lean protein intake, increasing water intake, decreasing eating out, no skipping meals, meal planning and cooking strategies, better snacking choices, and decreasing junk food.  Kathleen [name] has agreed to follow-up with our clinic in 4 weeks. She was informed of the importance of frequent follow-up visits to maximize her success with intensive lifestyle modifications for her multiple health conditions.   Objective:   Blood pressure (!) 166/72, pulse 60, temperature 98.2 F (36.8 C), height '5\' 4"'$  (1.626 m), weight 235 lb (106.6 kg), SpO2 98 %. Body mass index is  40.34 kg/m.  General: Cooperative, alert, well developed, in no acute distress. HEENT: Conjunctivae and lids unremarkable. Cardiovascular: Regular rhythm.  Lungs: Normal work of breathing. Neurologic: No focal deficits.   Lab Results  Component Value Date   CREATININE 1.00 09/02/2021   BUN 31 (H) 09/02/2021   NA 141 09/02/2021   K 4.3 09/02/2021   CL 103 09/02/2021   CO2 23 09/02/2021   Lab Results  Component Value Date   ALT 12 09/02/2021   AST 12 09/02/2021   ALKPHOS 71 09/02/2021   BILITOT 0.3 09/02/2021   Lab Results  Component Value Date   HGBA1C 6.0 (H) 09/02/2021   HGBA1C 5.7 (H) 03/11/2021   HGBA1C 5.4 09/10/2020   HGBA1C 5.8 (H) 06/05/2020   HGBA1C 5.9 (H) 02/09/2020   Lab Results  Component Value Date   INSULIN 18.4 09/02/2021   INSULIN 18.4 03/11/2021   INSULIN 14.3 09/10/2020   INSULIN 14.5 06/05/2020   INSULIN 17.6 02/09/2020   Lab Results  Component Value Date   TSH 1.480 04/12/2018   Lab Results  Component Value Date   CHOL 152 09/02/2021   HDL 70 09/02/2021   LDLCALC 67 09/02/2021   TRIG 78 09/02/2021   CHOLHDL 2.2 09/02/2021   Lab Results  Component Value Date   VD25OH 72.1 09/02/2021   VD25OH 75.1 03/11/2021   VD25OH 66.1 09/10/2020   Lab Results  Component Value Date   WBC 7.5 09/10/2020   HGB 12.1 09/10/2020   HCT 36.8 09/10/2020   MCV 90 09/10/2020   PLT 273 09/10/2020   No results  found for: "IRON", "TIBC", "FERRITIN"  Obesity Behavioral Intervention:   Approximately 15 minutes were spent on the discussion below.  ASK: We discussed the diagnosis of obesity with Kathleen [name] today and Kathleen [name] agreed to give [name] permission to discuss obesity behavioral modification therapy today.  ASSESS: Kathleen [name] has the diagnosis of obesity and her BMI today is 40.4. Kathleen [name] is in the action stage of change.   ADVISE: Kathleen [name] was educated on the multiple health risks of obesity as well as the benefit of weight loss to improve her  health. She was advised of the need for long term treatment and the importance of lifestyle modifications to improve her current health and to decrease her risk of future health problems.  AGREE: Multiple dietary modification options and treatment options were discussed and Kathleen [name] agreed to follow the recommendations documented in the above note.  ARRANGE: Kathleen [name] was educated on the importance of frequent visits to treat obesity as outlined per CMS and USPSTF guidelines and agreed to schedule her next follow up appointment today.  Attestation Statements:   Reviewed by clinician on day of visit: allergies, medications, problem list, medical history, surgical history, family history, social history, and previous encounter notes.   Wilhemena Durie, am acting as transcriptionist for Loyal Gambler, DO.  I have reviewed the above documentation for accuracy and completeness, and I agree with the above. -  ***

## 2022-02-26 ENCOUNTER — Encounter (INDEPENDENT_AMBULATORY_CARE_PROVIDER_SITE_OTHER): Payer: Self-pay | Admitting: Family Medicine

## 2022-02-26 ENCOUNTER — Ambulatory Visit (INDEPENDENT_AMBULATORY_CARE_PROVIDER_SITE_OTHER): Payer: Medicare Other | Admitting: Family Medicine

## 2022-02-26 VITALS — BP 145/72 | HR 51 | Temp 98.4°F | Wt 235.0 lb

## 2022-02-26 DIAGNOSIS — R7303 Prediabetes: Secondary | ICD-10-CM | POA: Diagnosis not present

## 2022-02-26 DIAGNOSIS — Z9989 Dependence on other enabling machines and devices: Secondary | ICD-10-CM

## 2022-02-26 DIAGNOSIS — E669 Obesity, unspecified: Secondary | ICD-10-CM | POA: Diagnosis not present

## 2022-02-26 DIAGNOSIS — I152 Hypertension secondary to endocrine disorders: Secondary | ICD-10-CM | POA: Insufficient documentation

## 2022-02-26 DIAGNOSIS — I10 Essential (primary) hypertension: Secondary | ICD-10-CM | POA: Diagnosis not present

## 2022-02-26 DIAGNOSIS — G4733 Obstructive sleep apnea (adult) (pediatric): Secondary | ICD-10-CM

## 2022-02-26 DIAGNOSIS — Z6841 Body Mass Index (BMI) 40.0 and over, adult: Secondary | ICD-10-CM

## 2022-03-03 NOTE — Progress Notes (Unsigned)
Chief Complaint:   Kathleen [name] is here to discuss her progress with her obesity treatment plan along with follow-up of her obesity related diagnoses. Kathleen [name] is on keeping a food journal and adhering to recommended goals of 1100 calories and 85 gms protein and states she is following her eating plan approximately 90% of the time. Kathleen [name] states she is riding a stationary bike for 15 minutes 3 times per week.  Today's visit was #: 48 Starting weight: 259 lbs Starting date: 04/12/2018 Today's weight: 235 lbs Today's date: 02/26/22 Total lbs lost to date: 24 Total lbs lost since last in-office visit: 0  Interim History: Kathleen [name] had a friend with her for a week-ate more pasta.  She was at the beach last week.  She was still mindful of portions and food choices.  She got some walking in at the beach and plans to increase physical activity.  She denies hunger or cravings.  Subjective:   1. OSA on CPAP She is wearing CPAP nightly, about 7 hours of sleep at night.  2. Pre-diabetes She is on metformin 500 mg XR and taking with food.  Has occasional diarrhea.  3. Essential hypertension Blood pressure mildly elevated today. She is on valsartan, chlorthalidone, bisoprolol daily. Denies chest pain.  Assessment/Plan:   1. OSA on CPAP Continue CPAP and 7 hours of sleep at night.  2. Pre-diabetes Update A1c, BMP, fasting insulin in 4 weeks. Continue to limit intake of sugar and starches.  3. Essential hypertension Continue current blood pressure meds and heart healthy diet.  4. Obesity,current BMI 40.3  Kathleen [name] is currently in the action stage of change. As such, her goal is to continue with weight loss efforts. She has agreed to keeping a food journal and adhering to recommended goals of 1100 calories and 85 gms of protein daily.  Reviewed options for sugar substitutes.  Exercise goals: Increase walking to 30 minutes 3 times per week.  Behavioral modification  strategies: increasing lean protein intake, increasing water intake, decreasing liquid calories, decreasing eating out, no skipping meals, meal planning and cooking strategies, better snacking choices, celebration eating strategies, and keeping a strict food journal.  Kathleen [name] has agreed to follow-up with our clinic in 4 weeks with fasting labs. She was informed of the importance of frequent follow-up visits to maximize her success with intensive lifestyle modifications for her multiple health conditions.    Objective:   Blood pressure (!) 145/72, pulse (!) 51, temperature 98.4 F (36.9 C), weight 235 lb (106.6 kg), SpO2 99 %. Body mass index is 40.34 kg/m.  General: Cooperative, alert, well developed, in no acute distress. HEENT: Conjunctivae and lids unremarkable. Cardiovascular: Regular rhythm.  Lungs: Normal work of breathing. Neurologic: No focal deficits.   Lab Results  Component Value Date   CREATININE 1.00 09/02/2021   BUN 31 (H) 09/02/2021   NA 141 09/02/2021   K 4.3 09/02/2021   CL 103 09/02/2021   CO2 23 09/02/2021   Lab Results  Component Value Date   ALT 12 09/02/2021   AST 12 09/02/2021   ALKPHOS 71 09/02/2021   BILITOT 0.3 09/02/2021   Lab Results  Component Value Date   HGBA1C 6.0 (H) 09/02/2021   HGBA1C 5.7 (H) 03/11/2021   HGBA1C 5.4 09/10/2020   HGBA1C 5.8 (H) 06/05/2020   HGBA1C 5.9 (H) 02/09/2020   Lab Results  Component Value Date   INSULIN 18.4 09/02/2021   INSULIN 18.4 03/11/2021   INSULIN 14.3 09/10/2020   INSULIN  14.5 06/05/2020   INSULIN 17.6 02/09/2020   Lab Results  Component Value Date   TSH 1.480 04/12/2018   Lab Results  Component Value Date   CHOL 152 09/02/2021   HDL 70 09/02/2021   LDLCALC 67 09/02/2021   TRIG 78 09/02/2021   CHOLHDL 2.2 09/02/2021   Lab Results  Component Value Date   VD25OH 72.1 09/02/2021   VD25OH 75.1 03/11/2021   VD25OH 66.1 09/10/2020   Lab Results  Component Value Date   WBC 7.5 09/10/2020    HGB 12.1 09/10/2020   HCT 36.8 09/10/2020   MCV 90 09/10/2020   PLT 273 09/10/2020   No results found for: "IRON", "TIBC", "FERRITIN"  Obesity Behavioral Intervention:   Approximately 15 minutes were spent on the discussion below.  ASK: We discussed the diagnosis of obesity with Kathleen [name] today and Kathleen [name] agreed to give [name] permission to discuss obesity behavioral modification therapy today.  ASSESS: Kathleen [name] has the diagnosis of obesity and her BMI today is 40.3. Kathleen [name] is in the action stage of change.   ADVISE: Kathleen [name] was educated on the multiple health risks of obesity as well as the benefit of weight loss to improve her health. She was advised of the need for long term treatment and the importance of lifestyle modifications to improve her current health and to decrease her risk of future health problems.  AGREE: Multiple dietary modification options and treatment options were discussed and Kathleen [name] agreed to follow the recommendations documented in the above note.  ARRANGE: Kathleen [name] was educated on the importance of frequent visits to treat obesity as outlined per CMS and USPSTF guidelines and agreed to schedule her next follow up appointment today.  Attestation Statements:   Reviewed by clinician on day of visit: allergies, medications, problem list, medical history, surgical history, family history, social history, and previous encounter notes.  I, Georgianne Fick, FNP, am acting as transcriptionist for Dr. Loyal Gambler.  I have reviewed the above documentation for accuracy and completeness, and I agree with the above. Dell Ponto, DO

## 2022-03-20 ENCOUNTER — Encounter (INDEPENDENT_AMBULATORY_CARE_PROVIDER_SITE_OTHER): Payer: Self-pay | Admitting: Family Medicine

## 2022-03-20 ENCOUNTER — Ambulatory Visit (INDEPENDENT_AMBULATORY_CARE_PROVIDER_SITE_OTHER): Payer: Medicare Other | Admitting: Family Medicine

## 2022-03-20 VITALS — BP 148/84 | HR 52 | Temp 98.0°F | Ht 64.0 in | Wt 235.0 lb

## 2022-03-20 DIAGNOSIS — Z79899 Other long term (current) drug therapy: Secondary | ICD-10-CM

## 2022-03-20 DIAGNOSIS — Z7984 Long term (current) use of oral hypoglycemic drugs: Secondary | ICD-10-CM

## 2022-03-20 DIAGNOSIS — E669 Obesity, unspecified: Secondary | ICD-10-CM | POA: Diagnosis not present

## 2022-03-20 DIAGNOSIS — E1169 Type 2 diabetes mellitus with other specified complication: Secondary | ICD-10-CM | POA: Insufficient documentation

## 2022-03-20 DIAGNOSIS — E559 Vitamin D deficiency, unspecified: Secondary | ICD-10-CM | POA: Diagnosis not present

## 2022-03-20 DIAGNOSIS — E785 Hyperlipidemia, unspecified: Secondary | ICD-10-CM | POA: Diagnosis not present

## 2022-03-20 DIAGNOSIS — E1159 Type 2 diabetes mellitus with other circulatory complications: Secondary | ICD-10-CM | POA: Diagnosis not present

## 2022-03-20 DIAGNOSIS — I152 Hypertension secondary to endocrine disorders: Secondary | ICD-10-CM | POA: Diagnosis not present

## 2022-03-20 DIAGNOSIS — Z6841 Body Mass Index (BMI) 40.0 and over, adult: Secondary | ICD-10-CM

## 2022-03-25 LAB — COMPREHENSIVE METABOLIC PANEL
ALT: 14 IU/L (ref 0–32)
AST: 16 IU/L (ref 0–40)
Albumin/Globulin Ratio: 1.5 (ref 1.2–2.2)
Albumin: 4.5 g/dL (ref 3.8–4.8)
Alkaline Phosphatase: 69 IU/L (ref 44–121)
BUN/Creatinine Ratio: 29 — ABNORMAL HIGH (ref 12–28)
BUN: 25 mg/dL (ref 8–27)
Bilirubin Total: 0.3 mg/dL (ref 0.0–1.2)
CO2: 23 mmol/L (ref 20–29)
Calcium: 9.8 mg/dL (ref 8.7–10.3)
Chloride: 99 mmol/L (ref 96–106)
Creatinine, Ser: 0.86 mg/dL (ref 0.57–1.00)
Globulin, Total: 3.1 g/dL (ref 1.5–4.5)
Glucose: 106 mg/dL — ABNORMAL HIGH (ref 70–99)
Potassium: 4.2 mmol/L (ref 3.5–5.2)
Sodium: 139 mmol/L (ref 134–144)
Total Protein: 7.6 g/dL (ref 6.0–8.5)
eGFR: 69 mL/min/{1.73_m2} (ref >59)

## 2022-03-25 LAB — LIPID PANEL WITH LDL/HDL RATIO
Cholesterol, Total: 148 mg/dL (ref 100–199)
HDL: 66 mg/dL (ref >39)
LDL Chol Calc (NIH): 65 mg/dL (ref 0–99)
LDL/HDL Ratio: 1 ratio (ref 0.0–3.2)
Triglycerides: 94 mg/dL (ref 0–149)
VLDL Cholesterol Cal: 17 mg/dL (ref 5–40)

## 2022-03-25 LAB — VITAMIN B12: Vitamin B-12: 544 pg/mL (ref 232–1245)

## 2022-03-25 LAB — VITAMIN D 25 HYDROXY (VIT D DEFICIENCY, FRACTURES): Vit D, 25-Hydroxy: 57.2 ng/mL (ref 30.0–100.0)

## 2022-03-25 LAB — T4, FREE: Free T4: 1.16 ng/dL (ref 0.82–1.77)

## 2022-03-25 LAB — HEMOGLOBIN A1C
Est. average glucose Bld gHb Est-mCnc: 123 mg/dL
Hgb A1c MFr Bld: 5.9 % — ABNORMAL HIGH (ref 4.8–5.6)

## 2022-03-25 LAB — TSH: TSH: 1.51 u[IU]/mL (ref 0.450–4.500)

## 2022-03-25 LAB — MAGNESIUM: Magnesium: 2 mg/dL (ref 1.6–2.3)

## 2022-03-25 LAB — INSULIN, RANDOM: INSULIN: 14.5 u[IU]/mL (ref 2.6–24.9)

## 2022-03-27 DIAGNOSIS — E782 Mixed hyperlipidemia: Secondary | ICD-10-CM | POA: Diagnosis not present

## 2022-03-27 DIAGNOSIS — K219 Gastro-esophageal reflux disease without esophagitis: Secondary | ICD-10-CM | POA: Diagnosis not present

## 2022-03-27 DIAGNOSIS — E1169 Type 2 diabetes mellitus with other specified complication: Secondary | ICD-10-CM | POA: Diagnosis not present

## 2022-03-27 DIAGNOSIS — I10 Essential (primary) hypertension: Secondary | ICD-10-CM | POA: Diagnosis not present

## 2022-04-13 MED ORDER — METFORMIN HCL 500 MG PO TABS: 500.0000 mg | ORAL_TABLET | Freq: Every day | ORAL | 0 refills | Status: DC

## 2022-04-16 ENCOUNTER — Encounter (INDEPENDENT_AMBULATORY_CARE_PROVIDER_SITE_OTHER): Payer: Self-pay | Admitting: Family Medicine

## 2022-04-16 ENCOUNTER — Ambulatory Visit (INDEPENDENT_AMBULATORY_CARE_PROVIDER_SITE_OTHER): Payer: Medicare Other | Admitting: Family Medicine

## 2022-04-16 VITALS — BP 150/82 | HR 58 | Temp 98.3°F | Ht 64.0 in | Wt 236.0 lb

## 2022-04-16 DIAGNOSIS — E559 Vitamin D deficiency, unspecified: Secondary | ICD-10-CM | POA: Diagnosis not present

## 2022-04-16 DIAGNOSIS — E119 Type 2 diabetes mellitus without complications: Secondary | ICD-10-CM

## 2022-04-16 DIAGNOSIS — E1159 Type 2 diabetes mellitus with other circulatory complications: Secondary | ICD-10-CM

## 2022-04-16 DIAGNOSIS — E785 Hyperlipidemia, unspecified: Secondary | ICD-10-CM

## 2022-04-16 DIAGNOSIS — E1169 Type 2 diabetes mellitus with other specified complication: Secondary | ICD-10-CM

## 2022-04-16 DIAGNOSIS — E669 Obesity, unspecified: Secondary | ICD-10-CM

## 2022-04-16 DIAGNOSIS — Z7984 Long term (current) use of oral hypoglycemic drugs: Secondary | ICD-10-CM

## 2022-04-16 DIAGNOSIS — Z6841 Body Mass Index (BMI) 40.0 and over, adult: Secondary | ICD-10-CM

## 2022-04-16 MED ORDER — METFORMIN HCL 500 MG PO TABS: 500.0000 mg | ORAL_TABLET | Freq: Every day | ORAL | 0 refills | Status: DC

## 2022-04-22 NOTE — Progress Notes (Signed)
Chief Complaint:   Kathleen [name] is here to discuss her progress with her obesity treatment plan along with follow-up of her obesity related diagnoses. Kathleen [name] is on keeping a food journal and adhering to recommended goals of 1100 calories and 85 protein and states she is following her eating plan approximately 75% of the time. Kathleen [name] states she is walking 15 minutes 2 times per week.  Today's visit was #: 58 Starting weight: 259 lbs Starting date: 04/12/2018 Today's weight: 235 lbs Today's date: 03/20/2022 Total lbs lost to date: 24 lbs Total lbs lost since last in-office visit: 0  Interim History: Patient states that she has been away at a beach with a friend and she ate more off plan.  She is happy she did not gain.  She is still trying to follow [place].  She denies hunger or cravings.  She will be traveling a lot the next 6 weeks to beach and elsewhere.    Subjective:   1. Type 2 diabetes mellitus with other circulatory complication, without long-term current use of insulin (HCC) Has not been checking blood sugars lately.  She has no issues or concerns.  A1c 6.8, 04/12/2018 with [name].  A1c 6.0 on 03/23.  Loose stools have not been bad lately on metformin.  2. Hypertension associated with type 2 diabetes mellitus ([place]) PCP has patient taking Diovan, Zebeta, [place].  Asymptomatic, no concerns today. Not checking blood pressures at home as of late.  Historically well controlled, 120's-170's.  Blood pressure high normal for patient of her age and MMP  (multi medical problems). Explained to patient that it is important to follow up with PCP for routine chronic medication management of her conditions that are not related to weight loss.    3. Vitamin D deficiency Vitamin D given to her by her PCP Dr Kenton Kingfisher.  Patient is unsure of the last check with PCP.  She was here 03/22, Vitamin D level was 66.1.  Patient requests check of Vitamin D level today.    Assessment/Plan:   Orders  Placed This Encounter  Procedures   VITAMIN D 25 Hydroxy (Vit-D Deficiency, Fractures)   TSH   T4, free   Lipid Panel With LDL/HDL Ratio   Insulin, random   Hemoglobin A1c   Comprehensive metabolic panel   Vitamin N27   Magnesium    Medications Discontinued During This Encounter  Medication Reason   metFORMIN (GLUCOPHAGE) 500 MG tablet Reorder     Meds ordered this encounter  Medications   DISCONTD: metFORMIN (GLUCOPHAGE) 500 MG tablet    Sig: Take 1 tablet (500 mg total) by mouth daily with breakfast.    Dispense:  30 tablet    Refill:  0     1. Type 2 diabetes mellitus with other circulatory complication, without long-term current use of insulin (HCC) Check labs today.   - TSH - T4, free - Lipid Panel With LDL/HDL Ratio - Insulin, random - Hemoglobin A1c - Comprehensive metabolic panel - Vitamin P82 - Magnesium  Refill - metFORMIN (GLUCOPHAGE) 500 MG tablet; Take 1 tablet (500 mg total) by mouth daily with breakfast. Taking one time a day at breakfast.  Dispense: 30 tablet; Refill: 0   2. Hypertension associated with type 2 diabetes mellitus ([place]) Follow up with PCP for chronic diagnosis management.   Check labs today.   - TSH - T4, free - Lipid Panel With LDL/HDL Ratio - Comprehensive metabolic panel - Magnesium  3. Vitamin D deficiency Continue supplementation.  Will change dose if needed.  Assess bone density every 2 years.  Weight bearing exercises recommended.   Check labs today.  - VITAMIN D 25 Hydroxy (Vit-D Deficiency, Fractures)  4. Obesity,current BMI 40.3 Eating strategies, on the go, reviewed with patient.  Handout given on eating out.   Kathleen [name] is currently in the action stage of change. As such, her goal is to continue with weight loss efforts. She has agreed to keeping a food journal and adhering to recommended goals of 1000 calories and 85+ protein daily.  Exercise goals:  increase walking frequency.   Behavioral modification  strategies: travel eating strategies and avoiding temptations.  Kathleen [name] has agreed to follow-up with our clinic in 4 weeks. She was informed of the importance of frequent follow-up visits to maximize her success with intensive lifestyle modifications for her multiple health conditions.   Kathleen [name] was informed we would discuss her lab results at her next visit unless there is a critical issue that needs to be addressed sooner. Kathleen [name] agreed to keep her next visit at the agreed upon time to discuss these results.  Objective:   Blood pressure (!) 148/84, pulse (!) 52, temperature 98 F (36.7 C), height '5\' 4"'$  (1.626 m), weight 235 lb (106.6 kg), SpO2 98 %. Body mass index is 40.34 kg/m.  General: Cooperative, alert, well developed, in no acute distress. HEENT: Conjunctivae and lids unremarkable. Cardiovascular: Regular rhythm.  Lungs: Normal work of breathing. Neurologic: No focal deficits.   Lab Results  Component Value Date   CREATININE 0.86 03/20/2022   BUN 25 03/20/2022   NA 139 03/20/2022   K 4.2 03/20/2022   CL 99 03/20/2022   CO2 23 03/20/2022   Lab Results  Component Value Date   ALT 14 03/20/2022   AST 16 03/20/2022   ALKPHOS 69 03/20/2022   BILITOT 0.3 03/20/2022   Lab Results  Component Value Date   HGBA1C 5.9 (H) 03/20/2022   HGBA1C 6.0 (H) 09/02/2021   HGBA1C 5.7 (H) 03/11/2021   HGBA1C 5.4 09/10/2020   HGBA1C 5.8 (H) 06/05/2020   Lab Results  Component Value Date   INSULIN 14.5 03/20/2022   INSULIN 18.4 09/02/2021   INSULIN 18.4 03/11/2021   INSULIN 14.3 09/10/2020   INSULIN 14.5 06/05/2020   Lab Results  Component Value Date   TSH 1.510 03/20/2022   Lab Results  Component Value Date   CHOL 148 03/20/2022   HDL 66 03/20/2022   LDLCALC 65 03/20/2022   TRIG 94 03/20/2022   CHOLHDL 2.2 09/02/2021   Lab Results  Component Value Date   VD25OH 57.2 03/20/2022   VD25OH 72.1 09/02/2021   VD25OH 75.1 03/11/2021   Lab Results  Component Value  Date   WBC 7.5 09/10/2020   HGB 12.1 09/10/2020   HCT 36.8 09/10/2020   MCV 90 09/10/2020   PLT 273 09/10/2020   No results found for: "IRON", "TIBC", "FERRITIN"  Obesity Behavioral Intervention:   Approximately 15 minutes were spent on the discussion below.  ASK: We discussed the diagnosis of obesity with Kathleen [name] today and Kathleen [name] agreed to give [name] permission to discuss obesity behavioral modification therapy today.  ASSESS: Kathleen [name] has the diagnosis of obesity and her BMI today is 40.3. Kathleen [name] is in the action stage of change.   ADVISE: Kathleen [name] was educated on the multiple health risks of obesity as well as the benefit of weight loss to improve her health. She was advised of the need for long term treatment and the importance  of lifestyle modifications to improve her current health and to decrease her risk of future health problems.  AGREE: Multiple dietary modification options and treatment options were discussed and Kathleen [name] agreed to follow the recommendations documented in the above note.  ARRANGE: Kathleen [name] was educated on the importance of frequent visits to treat obesity as outlined per CMS and USPSTF guidelines and agreed to schedule her next follow up appointment today.  Attestation Statements:   Reviewed by clinician on day of visit: allergies, medications, problem list, medical history, surgical history, family history, social history, and previous encounter notes.  I, Davy Pique, RMA, am acting as Location manager for [employer], DO.   I have reviewed the above documentation for accuracy and completeness, and I agree with the above. Marjory Sneddon, D.O.  The [place] was signed into law in 2016 which includes the topic of electronic health records.  This provides immediate access to information in MyChart.  This includes consultation notes, operative notes, office notes, lab results and pathology reports.  If you have any questions about  what you read please let us know at your next visit so we can discuss your concerns and take corrective action if need be.  We are right here with you.

## 2022-04-26 NOTE — Progress Notes (Signed)
Chief Complaint:   Kathleen [name] is here to discuss her progress with her obesity treatment plan along with follow-up of her obesity related diagnoses. Kathleen [name] is on keeping a food journal and adhering to recommended goals of 1000 calories and 85 grams protein and states she is following her eating plan approximately 30% of the time. Kathleen [name] states she is walking 20 minutes 1 time every other week.  Today's visit was #: 88 Starting weight: 259 lbs Starting date: 04/12/2018 Today's weight: 236 lbs Today's date: 04/16/2022 Total lbs lost to date: 23 Total lbs lost since last in-office visit: +1  Interim History: Kathleen [name] had her 81th birthday party and was at [employer] for 10 days. She is doing excellent.  Review labs today- A1c, mag, B12, FI, FLP, TSH, FT4, Vit D.   Subjective:   1. Type 2 diabetes mellitus with obesity (Kathleen [name]) Discussed labs with patient today. Pt takes 1 tab Metformin every morning with breakfast. If she doesn't take it, she has increased hunger.  Blood sugar 2 hours post prandial = 169 Fasting blood sugar = 120's (once it was 151 after her birthday)  2. Vitamin D deficiency New. Discussed labs with patient today. Vit D level is 57.2 and at goal.  3. Hyperlipidemia associated with type 2 diabetes mellitus (Kathleen [name]) Discussed labs with patient today. Medication: Zocor  Assessment/Plan:  No orders of the defined types were placed in this encounter.   Medications Discontinued During This Encounter  Medication Reason   metFORMIN (GLUCOPHAGE) 500 MG tablet Reorder     Meds ordered this encounter  Medications   metFORMIN (GLUCOPHAGE) 500 MG tablet    Sig: Take 1 tablet (500 mg total) by mouth daily with breakfast.    Dispense:  30 tablet    Refill:  0    30 d supply;  ** OV for RF **   Do not send RF request     1. Type 2 diabetes mellitus with obesity (Kathleen [name]) Good blood sugar control is important to decrease the likelihood of diabetic complications  such as nephropathy, neuropathy, limb loss, blindness, coronary artery disease, and death. Intensive lifestyle modification including diet, exercise and weight loss are the first line of treatment for diabetes.  A1c is 5.9 and fasting insulin is elevated. However, overall under great control. Serum creatinine = <1.0 and improved.  Refill- metFORMIN (GLUCOPHAGE) 500 MG tablet; Take 1 tablet (500 mg total) by mouth daily with breakfast.  Dispense: 30 tablet; Refill: 0  2. Vitamin D deficiency At goal. Low Vitamin D level contributes to fatigue and are associated with obesity, breast, and colon cancer. She agrees to continue to take current Vitamin D supplement and will follow-up for routine testing of Vitamin D, at least 2-3 times per year to avoid over-replacement.  3. Hyperlipidemia associated with type 2 diabetes mellitus (Kathleen [name]) At goal. LDL is <70 and HDL is >60. Cardiovascular risk and specific lipid/LDL goals reviewed.  We discussed several lifestyle modifications today and Kathleen [name] will continue to work on diet, exercise and weight loss efforts. Orders and follow up as documented in patient record.   Counseling Intensive lifestyle modifications are the first line treatment for this issue. Dietary changes: Increase soluble fiber. Decrease simple carbohydrates. Exercise changes: Moderate to vigorous-intensity aerobic activity 150 minutes per week if tolerated. Lipid-lowering medications: see documented in medical record.  4. Obesity,current BMI 40.6 Kathleen [name] is currently in the action stage of change. As such, her goal is to continue with weight loss  efforts. She has agreed to keeping a food journal and adhering to recommended goals of 1000 calories and 85 grams protein.   Needs repeat IC at next OV.  Exercise goals:  Increase a tolerated to walking 20 minutes 2-3 days a week.  Behavioral modification strategies: meal planning and cooking strategies, planning for success, and keeping a  strict food journal.  Kathleen [name] has agreed to follow-up with our clinic in 2-3 weeks. She was informed of the importance of frequent follow-up visits to maximize her success with intensive lifestyle modifications for her multiple health conditions.   Objective:   Blood pressure (!) 150/82, pulse (!) 58, temperature 98.3 F (36.8 C), height '5\' 4"'$  (1.626 m), weight 236 lb (107 kg), SpO2 98 %. Body mass index is 40.51 kg/m.  General: Cooperative, alert, well developed, in no acute distress. HEENT: Conjunctivae and lids unremarkable. Cardiovascular: Regular rhythm.  Lungs: Normal work of breathing. Neurologic: No focal deficits.   Lab Results  Component Value Date   CREATININE 0.86 03/20/2022   BUN 25 03/20/2022   NA 139 03/20/2022   K 4.2 03/20/2022   CL 99 03/20/2022   CO2 23 03/20/2022   Lab Results  Component Value Date   ALT 14 03/20/2022   AST 16 03/20/2022   ALKPHOS 69 03/20/2022   BILITOT 0.3 03/20/2022   Lab Results  Component Value Date   HGBA1C 5.9 (H) 03/20/2022   HGBA1C 6.0 (H) 09/02/2021   HGBA1C 5.7 (H) 03/11/2021   HGBA1C 5.4 09/10/2020   HGBA1C 5.8 (H) 06/05/2020   Lab Results  Component Value Date   INSULIN 14.5 03/20/2022   INSULIN 18.4 09/02/2021   INSULIN 18.4 03/11/2021   INSULIN 14.3 09/10/2020   INSULIN 14.5 06/05/2020   Lab Results  Component Value Date   TSH 1.510 03/20/2022   Lab Results  Component Value Date   CHOL 148 03/20/2022   HDL 66 03/20/2022   LDLCALC 65 03/20/2022   TRIG 94 03/20/2022   CHOLHDL 2.2 09/02/2021   Lab Results  Component Value Date   VD25OH 57.2 03/20/2022   VD25OH 72.1 09/02/2021   VD25OH 75.1 03/11/2021   Lab Results  Component Value Date   WBC 7.5 09/10/2020   HGB 12.1 09/10/2020   HCT 36.8 09/10/2020   MCV 90 09/10/2020   PLT 273 09/10/2020    Attestation Statements:   Reviewed by clinician on day of visit: allergies, medications, problem list, medical history, surgical history, family  history, social history, and previous encounter notes.  I, Kathlene November, BS, CMA, am acting as transcriptionist for [employer], DO.   I have reviewed the above documentation for accuracy and completeness, and I agree with the above. Kathleen [name], D.O.  The [place] was signed into law in 2016 which includes the topic of electronic health records.  This provides immediate access to information in MyChart.  This includes consultation notes, operative notes, office notes, lab results and pathology reports.  If you have any questions about what you read please let us know at your next visit so we can discuss your concerns and take corrective action if need be.  We are right here with you.

## 2022-04-30 ENCOUNTER — Ambulatory Visit (INDEPENDENT_AMBULATORY_CARE_PROVIDER_SITE_OTHER): Payer: Medicare Other | Admitting: Physician Assistant

## 2022-04-30 VITALS — BP 148/78 | HR 59 | Temp 98.2°F | Ht 64.0 in | Wt 234.0 lb

## 2022-04-30 DIAGNOSIS — E669 Obesity, unspecified: Secondary | ICD-10-CM

## 2022-04-30 DIAGNOSIS — Z7985 Long-term (current) use of injectable non-insulin antidiabetic drugs: Secondary | ICD-10-CM | POA: Diagnosis not present

## 2022-04-30 DIAGNOSIS — E119 Type 2 diabetes mellitus without complications: Secondary | ICD-10-CM

## 2022-04-30 DIAGNOSIS — Z6841 Body Mass Index (BMI) 40.0 and over, adult: Secondary | ICD-10-CM | POA: Diagnosis not present

## 2022-04-30 DIAGNOSIS — R0602 Shortness of breath: Secondary | ICD-10-CM

## 2022-04-30 DIAGNOSIS — G4733 Obstructive sleep apnea (adult) (pediatric): Secondary | ICD-10-CM | POA: Diagnosis not present

## 2022-05-01 DIAGNOSIS — E119 Type 2 diabetes mellitus without complications: Secondary | ICD-10-CM | POA: Diagnosis not present

## 2022-05-01 DIAGNOSIS — H3562 Retinal hemorrhage, left eye: Secondary | ICD-10-CM | POA: Diagnosis not present

## 2022-05-06 NOTE — Progress Notes (Signed)
Chief Complaint:   Kathleen [name] is here to discuss her progress with her obesity treatment plan along with follow-up of her obesity related diagnoses. Kathleen [name] is on keeping a food journal and adhering to recommended goals of 1000 calories and 85 grams of protein and states she is following her eating plan approximately 75% of the time. Kathleen [name] states she is walking 5,000 steps 4-5 times per week.  Today's visit was #: 59 Starting weight: 259 lbs Starting date: 04/12/2018 Today's weight: 234 lbs Today's date: 04/30/2022 Total lbs lost to date: 25 lbs Total lbs lost since last in-office visit: 2  Interim History: Kathleen [name] has done well with weight loss. Total calories-[id number]/protein 63-113 grams. Fasting blood sugar 120-130's, in pm 87-115's over the past 2 weeks. Discussed making sure to meet protein goals.  Subjective:   1. SOB (shortness of breath) Indirect calorie meter results done today. Shows VO2 2.23 ml and RMR 1627 which is improved from previous RMR 1040 in 12/2020.  2. OSA on CPAP Kathleen [name] reports feels like she is getting more consistent seal with mask/better overall usage results. Discuss role of CPAP and better oxygenation overnight and how this helps with RMR.  3. Type 2 diabetes mellitus without complication, without long-term current use of insulin (HCC) [place]'s last A1c at 5.9, Insulin at 14.5--both improved. She is taking Metformin 500 mg daily--fasting blood sugars 120-130's. No side effects with Metformin.  Assessment/Plan:   1. SOB (shortness of breath) Continue eating plan to promote weight loss and continue strengthening/ exercise.  2. OSA on CPAP Continue CPAP and monitoring.  3. Type 2 diabetes mellitus without complication, without long-term current use of insulin (HCC) Continue Metformin and continue eating plan and exercise.  4. Obesity,current BMI 40.2 Kathleen [name] is currently in the action stage of change. As such, her goal is to  continue with weight loss efforts. She has agreed to keeping a food journal and adhering to recommended goals of 1100 calories and 85+ grams of protein daily.   Exercise goals: As is. Adding upper body strengthening.  Behavioral modification strategies: increasing lean protein intake, decreasing simple carbohydrates, holiday eating strategies , and keeping a strict food journal.  Kathleen [name] has agreed to follow-up with our clinic in 3 weeks. She was informed of the importance of frequent follow-up visits to maximize her success with intensive lifestyle modifications for her multiple health conditions.   Objective:   Blood pressure (!) 148/78, pulse (!) 59, temperature 98.2 F (36.8 C), height '5\' 4"'$  (1.626 m), weight 234 lb (106.1 kg), SpO2 98 %. Body mass index is 40.17 kg/m.  General: Cooperative, alert, well developed, in no acute distress. HEENT: Conjunctivae and lids unremarkable. Cardiovascular: Regular rhythm.  Lungs: Normal work of breathing. Neurologic: No focal deficits.   Lab Results  Component Value Date   CREATININE 0.86 03/20/2022   BUN 25 03/20/2022   NA 139 03/20/2022   K 4.2 03/20/2022   CL 99 03/20/2022   CO2 23 03/20/2022   Lab Results  Component Value Date   ALT 14 03/20/2022   AST 16 03/20/2022   ALKPHOS 69 03/20/2022   BILITOT 0.3 03/20/2022   Lab Results  Component Value Date   HGBA1C 5.9 (H) 03/20/2022   HGBA1C 6.0 (H) 09/02/2021   HGBA1C 5.7 (H) 03/11/2021   HGBA1C 5.4 09/10/2020   HGBA1C 5.8 (H) 06/05/2020   Lab Results  Component Value Date   INSULIN 14.5 03/20/2022   INSULIN 18.4 09/02/2021   INSULIN 18.4 03/11/2021  INSULIN 14.3 09/10/2020   INSULIN 14.5 06/05/2020   Lab Results  Component Value Date   TSH 1.510 03/20/2022   Lab Results  Component Value Date   CHOL 148 03/20/2022   HDL 66 03/20/2022   LDLCALC 65 03/20/2022   TRIG 94 03/20/2022   CHOLHDL 2.2 09/02/2021   Lab Results  Component Value Date   VD25OH 57.2  03/20/2022   VD25OH 72.1 09/02/2021   VD25OH 75.1 03/11/2021   Lab Results  Component Value Date   WBC 7.5 09/10/2020   HGB 12.1 09/10/2020   HCT 36.8 09/10/2020   MCV 90 09/10/2020   PLT 273 09/10/2020   No results found for: "IRON", "TIBC", "FERRITIN"  Attestation Statements:   Reviewed by clinician on day of visit: allergies, medications, problem list, medical history, surgical history, family history, social history, and previous encounter notes.  Time spent on visit including pre-visit chart review and post-visit care and charting was 35 minutes.   I, Brendell Tyus, am acting as transcriptionist for [employer], [place].  I have reviewed the above documentation for accuracy and completeness, and I agree with the above. -  Alece Koppel,PA-C

## 2022-05-13 ENCOUNTER — Encounter (INDEPENDENT_AMBULATORY_CARE_PROVIDER_SITE_OTHER): Payer: Self-pay | Admitting: Physician Assistant

## 2022-05-21 ENCOUNTER — Ambulatory Visit (INDEPENDENT_AMBULATORY_CARE_PROVIDER_SITE_OTHER): Payer: Medicare Other | Admitting: Family Medicine

## 2022-05-21 ENCOUNTER — Encounter (INDEPENDENT_AMBULATORY_CARE_PROVIDER_SITE_OTHER): Payer: Self-pay | Admitting: Family Medicine

## 2022-05-21 VITALS — BP 144/66 | HR 49 | Temp 98.1°F | Ht 64.0 in | Wt 234.6 lb

## 2022-05-21 DIAGNOSIS — E1159 Type 2 diabetes mellitus with other circulatory complications: Secondary | ICD-10-CM | POA: Diagnosis not present

## 2022-05-21 DIAGNOSIS — Z7984 Long term (current) use of oral hypoglycemic drugs: Secondary | ICD-10-CM

## 2022-05-21 DIAGNOSIS — E559 Vitamin D deficiency, unspecified: Secondary | ICD-10-CM

## 2022-05-21 DIAGNOSIS — Z6841 Body Mass Index (BMI) 40.0 and over, adult: Secondary | ICD-10-CM

## 2022-05-21 DIAGNOSIS — I152 Hypertension secondary to endocrine disorders: Secondary | ICD-10-CM

## 2022-05-21 DIAGNOSIS — E1169 Type 2 diabetes mellitus with other specified complication: Secondary | ICD-10-CM | POA: Diagnosis not present

## 2022-05-21 DIAGNOSIS — E669 Obesity, unspecified: Secondary | ICD-10-CM

## 2022-05-28 DIAGNOSIS — N390 Urinary tract infection, site not specified: Secondary | ICD-10-CM | POA: Diagnosis not present

## 2022-05-28 DIAGNOSIS — R300 Dysuria: Secondary | ICD-10-CM | POA: Diagnosis not present

## 2022-06-02 ENCOUNTER — Encounter (INDEPENDENT_AMBULATORY_CARE_PROVIDER_SITE_OTHER): Payer: Self-pay | Admitting: Physician Assistant

## 2022-06-02 ENCOUNTER — Ambulatory Visit (INDEPENDENT_AMBULATORY_CARE_PROVIDER_SITE_OTHER): Payer: Medicare Other | Admitting: Physician Assistant

## 2022-06-02 VITALS — BP 130/69 | HR 56 | Temp 98.0°F | Ht 64.0 in | Wt 234.0 lb

## 2022-06-02 DIAGNOSIS — E559 Vitamin D deficiency, unspecified: Secondary | ICD-10-CM | POA: Diagnosis not present

## 2022-06-02 DIAGNOSIS — E1159 Type 2 diabetes mellitus with other circulatory complications: Secondary | ICD-10-CM

## 2022-06-02 DIAGNOSIS — Z7985 Long-term (current) use of injectable non-insulin antidiabetic drugs: Secondary | ICD-10-CM

## 2022-06-02 DIAGNOSIS — G4733 Obstructive sleep apnea (adult) (pediatric): Secondary | ICD-10-CM

## 2022-06-02 DIAGNOSIS — Z6841 Body Mass Index (BMI) 40.0 and over, adult: Secondary | ICD-10-CM

## 2022-06-02 DIAGNOSIS — E669 Obesity, unspecified: Secondary | ICD-10-CM | POA: Diagnosis not present

## 2022-06-04 NOTE — Progress Notes (Signed)
Chief Complaint:   Kathleen [name] is here to discuss her progress with her obesity treatment plan along with follow-up of her obesity related diagnoses. Kathleen [name] is on keeping a food journal and adhering to recommended goals of 1100 calories and 85 protein and states she is following her eating plan approximately 50% of the time. Kathleen [name] states she is walking 10-15 minutes 3-4 times per week.  Today's visit was #: 59 Starting weight: 68 LBS Starting date: 04/12/2018 Today's weight: 234 LBS Today's date: 05/21/2022 Total lbs lost to date: 25 LBS Total lbs lost since last in-office visit: 0  Interim History: Patient had a GI bug several days ago did not eat much.  Having a suppertime staying within calorie goals and protein goals.  Subjective:   1. Type 2 diabetes mellitus with obesity (HCC) FBS, 113/35.  2-hour postprandial in the 130s to 140s.  Patient is taking Glucophage.  Patient hunger and cravings are well-controlled.  2. Hypertension associated with type 2 diabetes mellitus ([place]) Blood pressures are 122-135/70's.  No new dizziness even with her recent GI bug with vomiting and diarrhea.  Patient is taking Diovan and chlorthalidone.  3. Vitamin D deficiency Kathleen [name] B Arth is tolerating medication(s) well without side effects.  Medication compliance is good as patient endorses taking it as prescribed.  Symptoms are stable and the patient denies additional concerns regarding this condition.  Patient is taking vitamin D 50,000 IU weekly.  Assessment/Plan:  No orders of the defined types were placed in this encounter.   There are no discontinued medications.   No orders of the defined types were placed in this encounter.    1. Type 2 diabetes mellitus with obesity ([place]) Continue current medications.  A1c is at goal for age.  Continue PNP and weight loss.  2. Hypertension associated with type 2 diabetes mellitus ([place]) Blood pressure is at goal at home.  Continue  meds per PCP.  Decrease salt intake.  3. Vitamin D deficiency Continue vitamin D per PCP.  Counseled patient on importance of weightbearing exercise and osteoporosis prevention.  4. Obesity,current BMI 40.3 Holiday eating strategies and holiday recipe packet given to the patient today.  Kathleen [name] is currently in the action stage of change. As such, her goal is to continue with weight loss efforts. She has agreed to keeping a food journal and adhering to recommended goals of 1100-1200 calories and 85+ protein daily.  Exercise goals:  As is.  Behavioral modification strategies: holiday eating strategies .  Kathleen [name] has agreed to follow-up with our clinic in 2 weeks. She was informed of the importance of frequent follow-up visits to maximize her success with intensive lifestyle modifications for her multiple health conditions.   Objective:   Blood pressure (!) 144/66, pulse (!) 49, temperature 98.1 F (36.7 C), height '5\' 4"'$  (1.626 m), weight 234 lb 9.6 oz (106.4 kg), SpO2 98 %. Body mass index is 40.27 kg/m.  General: Cooperative, alert, well developed, in no acute distress. HEENT: Conjunctivae and lids unremarkable. Cardiovascular: Regular rhythm.  Lungs: Normal work of breathing. Neurologic: No focal deficits.   Lab Results  Component Value Date   CREATININE 0.86 03/20/2022   BUN 25 03/20/2022   NA 139 03/20/2022   K 4.2 03/20/2022   CL 99 03/20/2022   CO2 23 03/20/2022   Lab Results  Component Value Date   ALT 14 03/20/2022   AST 16 03/20/2022   ALKPHOS 69 03/20/2022   BILITOT 0.3 03/20/2022   Lab  Results  Component Value Date   HGBA1C 5.9 (H) 03/20/2022   HGBA1C 6.0 (H) 09/02/2021   HGBA1C 5.7 (H) 03/11/2021   HGBA1C 5.4 09/10/2020   HGBA1C 5.8 (H) 06/05/2020   Lab Results  Component Value Date   INSULIN 14.5 03/20/2022   INSULIN 18.4 09/02/2021   INSULIN 18.4 03/11/2021   INSULIN 14.3 09/10/2020   INSULIN 14.5 06/05/2020   Lab Results  Component Value  Date   TSH 1.510 03/20/2022   Lab Results  Component Value Date   CHOL 148 03/20/2022   HDL 66 03/20/2022   LDLCALC 65 03/20/2022   TRIG 94 03/20/2022   CHOLHDL 2.2 09/02/2021   Lab Results  Component Value Date   VD25OH 57.2 03/20/2022   VD25OH 72.1 09/02/2021   VD25OH 75.1 03/11/2021   Lab Results  Component Value Date   WBC 7.5 09/10/2020   HGB 12.1 09/10/2020   HCT 36.8 09/10/2020   MCV 90 09/10/2020   PLT 273 09/10/2020   No results found for: "IRON", "TIBC", "FERRITIN"  Attestation Statements:   Reviewed by clinician on day of visit: allergies, medications, problem list, medical history, surgical history, family history, social history, and previous encounter notes.  I, Davy Pique, RMA, am acting as Location manager for [employer], DO.   I have reviewed the above documentation for accuracy and completeness, and I agree with the above. Marjory Sneddon, D.O.  The [place] was signed into law in 2016 which includes the topic of electronic health records.  This provides immediate access to information in MyChart.  This includes consultation notes, operative notes, office notes, lab results and pathology reports.  If you have any questions about what you read please let us know at your next visit so we can discuss your concerns and take corrective action if need be.  We are right here with you.

## 2022-06-19 NOTE — Progress Notes (Signed)
Chief Complaint:   Kathleen [name] is here to discuss her progress with her obesity treatment plan along with follow-up of her obesity related diagnoses. Kathleen [name] is on keeping a food journal and adhering to recommended goals of 1100 calories and 85+ grams of protein and states she is following her eating plan approximately 80% of the time. Kathleen [name] states she is walking 20 minutes 2 times per week.  Today's visit was #: 44 Starting weight: 259 lbs Starting date: 04/12/2018 Today's weight: 234 lbs Today's date: 06/02/2022 Total lbs lost to date: 25 lbs Total lbs lost since last in-office visit: 0  Interim History: Kathleen [name] has done well with weight loss. Did well over Thanksgiving. Recent UTI, required antibiotics and off plan for a few days; back on track with prescribed nutrition plan Sees PCP-Dr. Kenton Kingfisher in late Jan 2024.  Subjective:   1. Type 2 diabetes mellitus with other circulatory complication, without long-term current use of insulin (HCC) A1c 5.9/insulin 14.5 on 03/20/22. Taking Metformin 500 mg daily--Denies any side effects. Working on decreasing simple carbohydrates. Fasting blood sugars--113-133. Post-prandial Blood sugar 130s.  2. OSA on CPAP Using a new CPAP and doing well--sleeping well most nights--got 8 hrs 20 mins last night and has good compliance with CPAP.   3. Vitamin D deficiency Level of 57.2 on 03/20/22--at goal. Kathleen [name] is currently taking prescription Vit D 50,000 IU once a week. Denies any side effects.  Assessment/Plan:   1. Type 2 diabetes mellitus with other circulatory complication, without long-term current use of insulin (HCC) Continue Metformin. Working on prescribed nutrition plan to decrease simple carbohydrates, increase lean proteins and exercise to promote weight loss.  2. OSA on CPAP Continue CPAP nightly. Working on prescribed nutrition plan and exercise to promote weight loss.  3. Vitamin D deficiency Continue Vit D 50K IU  weekly. Check vitamin D level 2-3 times yearly to avoid over supplementation.   4. Obesity,current BMI 40.2 Kathleen [name] is currently in the action stage of change. As such, her goal is to continue with weight loss efforts. She has agreed to keeping a food journal and adhering to recommended goals of 1100 calories and 85+ grams of protein daily.   Exercise goals: As is.  Behavioral modification strategies: increasing lean protein intake, decreasing simple carbohydrates, and holiday eating strategies .  Kathleen [name] has agreed to follow-up with our clinic in 4 weeks. She was informed of the importance of frequent follow-up visits to maximize her success with intensive lifestyle modifications for her multiple health conditions.   Objective:   Blood pressure 130/69, pulse (!) 56, temperature 98 F (36.7 C), height '5\' 4"'$  (1.626 m), weight 234 lb (106.1 kg), SpO2 97 %. Body mass index is 40.17 kg/m.  General: Cooperative, alert, well developed, in no acute distress. HEENT: Conjunctivae and lids unremarkable. Cardiovascular: Regular rhythm.  Lungs: Normal work of breathing. Neurologic: No focal deficits.   Lab Results  Component Value Date   CREATININE 0.86 03/20/2022   BUN 25 03/20/2022   NA 139 03/20/2022   K 4.2 03/20/2022   CL 99 03/20/2022   CO2 23 03/20/2022   Lab Results  Component Value Date   ALT 14 03/20/2022   AST 16 03/20/2022   ALKPHOS 69 03/20/2022   BILITOT 0.3 03/20/2022   Lab Results  Component Value Date   HGBA1C 5.9 (H) 03/20/2022   HGBA1C 6.0 (H) 09/02/2021   HGBA1C 5.7 (H) 03/11/2021   HGBA1C 5.4 09/10/2020   HGBA1C 5.8 (H) 06/05/2020  Lab Results  Component Value Date   INSULIN 14.5 03/20/2022   INSULIN 18.4 09/02/2021   INSULIN 18.4 03/11/2021   INSULIN 14.3 09/10/2020   INSULIN 14.5 06/05/2020   Lab Results  Component Value Date   TSH 1.510 03/20/2022   Lab Results  Component Value Date   CHOL 148 03/20/2022   HDL 66 03/20/2022   LDLCALC 65  03/20/2022   TRIG 94 03/20/2022   CHOLHDL 2.2 09/02/2021   Lab Results  Component Value Date   VD25OH 57.2 03/20/2022   VD25OH 72.1 09/02/2021   VD25OH 75.1 03/11/2021   Lab Results  Component Value Date   WBC 7.5 09/10/2020   HGB 12.1 09/10/2020   HCT 36.8 09/10/2020   MCV 90 09/10/2020   PLT 273 09/10/2020   No results found for: "IRON", "TIBC", "FERRITIN"  Attestation Statements:   Reviewed by clinician on day of visit: allergies, medications, problem list, medical history, surgical history, family history, social history, and previous encounter notes.  I, Brendell Tyus, am acting as transcriptionist for [employer], PA.  I have reviewed the above documentation for accuracy and completeness, and I agree with the above. -  Dontavious Emily,PA-C

## 2022-06-25 DIAGNOSIS — G4733 Obstructive sleep apnea (adult) (pediatric): Secondary | ICD-10-CM | POA: Diagnosis not present

## 2022-06-30 ENCOUNTER — Encounter (INDEPENDENT_AMBULATORY_CARE_PROVIDER_SITE_OTHER): Payer: Self-pay | Admitting: Family Medicine

## 2022-06-30 ENCOUNTER — Ambulatory Visit (INDEPENDENT_AMBULATORY_CARE_PROVIDER_SITE_OTHER): Payer: Medicare Other | Admitting: Family Medicine

## 2022-06-30 VITALS — BP 154/68 | HR 60 | Temp 98.3°F | Ht 64.0 in | Wt 236.6 lb

## 2022-06-30 DIAGNOSIS — Z6841 Body Mass Index (BMI) 40.0 and over, adult: Secondary | ICD-10-CM

## 2022-06-30 DIAGNOSIS — Z7984 Long term (current) use of oral hypoglycemic drugs: Secondary | ICD-10-CM

## 2022-06-30 DIAGNOSIS — E669 Obesity, unspecified: Secondary | ICD-10-CM | POA: Diagnosis not present

## 2022-06-30 DIAGNOSIS — E1169 Type 2 diabetes mellitus with other specified complication: Secondary | ICD-10-CM

## 2022-06-30 DIAGNOSIS — E559 Vitamin D deficiency, unspecified: Secondary | ICD-10-CM | POA: Diagnosis not present

## 2022-06-30 MED ORDER — METFORMIN HCL 500 MG PO TABS: 500.0000 mg | ORAL_TABLET | Freq: Every day | ORAL | 0 refills | Status: DC

## 2022-07-09 DIAGNOSIS — K219 Gastro-esophageal reflux disease without esophagitis: Secondary | ICD-10-CM | POA: Diagnosis not present

## 2022-07-09 DIAGNOSIS — E559 Vitamin D deficiency, unspecified: Secondary | ICD-10-CM | POA: Diagnosis not present

## 2022-07-09 DIAGNOSIS — E1169 Type 2 diabetes mellitus with other specified complication: Secondary | ICD-10-CM | POA: Diagnosis not present

## 2022-07-09 DIAGNOSIS — G4733 Obstructive sleep apnea (adult) (pediatric): Secondary | ICD-10-CM | POA: Diagnosis not present

## 2022-07-09 DIAGNOSIS — I10 Essential (primary) hypertension: Secondary | ICD-10-CM | POA: Diagnosis not present

## 2022-07-09 DIAGNOSIS — E782 Mixed hyperlipidemia: Secondary | ICD-10-CM | POA: Diagnosis not present

## 2022-07-17 NOTE — Progress Notes (Signed)
Chief Complaint:   Kathleen [name] is here to discuss her progress with her obesity treatment plan along with follow-up of her obesity related diagnoses. Kathleen [name] is on keeping a food journal and adhering to recommended goals of 1100 calories and 85+ grams protein and states she is following her eating plan approximately 60% of the time. Kathleen [name] states she is using stationary bike 20 minutes 2-3 times per week.  Today's visit was #: 54 Starting weight: 259 lbs Starting date: 04/12/2018 Today's weight: 236 lbs Today's date: 06/30/2022 Total lbs lost to date: 23 Total lbs lost since last in-office visit: +2  Interim History: Kathleen [name] recently had a cold. Also, her friend was in town from [place], and they ate out a lot, and pt did not track for 10 days.   Subjective:   1. Type 2 diabetes mellitus with obesity ([place]) Kathleen [name]'s last A1c was 5.9 on 03/20/2022 and her fasting blood sugar runs 110-147. Pt denies hunger or cravings.  2. Vitamin D deficiency She is currently taking prescription vitamin D 50,000 IU each week. She denies nausea, vomiting or muscle weakness.  Assessment/Plan:  No orders of the defined types were placed in this encounter.   Medications Discontinued During This Encounter  Medication Reason   metFORMIN (GLUCOPHAGE) 500 MG tablet Reorder     Meds ordered this encounter  Medications   metFORMIN (GLUCOPHAGE) 500 MG tablet    Sig: Take 1 tablet (500 mg total) by mouth daily with breakfast.    Dispense:  30 tablet    Refill:  0    30 d supply;  ** OV for RF **   Do not send RF request     1. Type 2 diabetes mellitus with obesity (HCC) Kathleen [name]'s serum creatinine was within normal limits 3 months ago at 0.86. Good blood sugar control is important to decrease the likelihood of diabetic complications such as nephropathy, neuropathy, limb loss, blindness, coronary artery disease, and death. Intensive lifestyle modification including diet, exercise and weight  loss are the first line of treatment for diabetes.   Refill- metFORMIN (GLUCOPHAGE) 500 MG tablet; Take 1 tablet (500 mg total) by mouth daily with breakfast.  Dispense: 30 tablet; Refill: 0  2. Vitamin D deficiency Continue Kathleen [name] per PCP. Vitamin D level was at goal 3 months ago.  3. Obesity,current BMI 40.6 Kathleen [name] is currently in the action stage of change. As such, her goal is to continue with weight loss efforts. She has agreed to keeping a food journal and adhering to recommended goals of 1100 calories and 80+ grams protein.   Increase exercise to 30 minutes 2 days a week of weight bearing exercise, such as walking, and continue stationary bike.  Increase water intake.  Exercise goals:  As is  Behavioral modification strategies: keeping a strict food journal.  [name] has agreed to follow-up with our clinic in 3 weeks. She was informed of the importance of frequent follow-up visits to maximize her success with intensive lifestyle modifications for her multiple health conditions.   Objective:   Blood pressure (!) 154/68, pulse 60, temperature 98.3 F (36.8 C), height 5' 4"$  (1.626 m), weight 236 lb 9.6 oz (107.3 kg), SpO2 97 %. Body mass index is 40.61 kg/m.  General: Cooperative, alert, well developed, in no acute distress. HEENT: Conjunctivae and lids unremarkable. Cardiovascular: Regular rhythm.  Lungs: Normal work of breathing. Neurologic: No focal deficits.   Lab Results  Component Value Date   CREATININE 0.86 03/20/2022   BUN  25 03/20/2022   NA 139 03/20/2022   K 4.2 03/20/2022   CL 99 03/20/2022   CO2 23 03/20/2022   Lab Results  Component Value Date   ALT 14 03/20/2022   AST 16 03/20/2022   ALKPHOS 69 03/20/2022   BILITOT 0.3 03/20/2022   Lab Results  Component Value Date   HGBA1C 5.9 (H) 03/20/2022   HGBA1C 6.0 (H) 09/02/2021   HGBA1C 5.7 (H) 03/11/2021   HGBA1C 5.4 09/10/2020   HGBA1C 5.8 (H) 06/05/2020   Lab Results  Component Value  Date   INSULIN 14.5 03/20/2022   INSULIN 18.4 09/02/2021   INSULIN 18.4 03/11/2021   INSULIN 14.3 09/10/2020   INSULIN 14.5 06/05/2020   Lab Results  Component Value Date   TSH 1.510 03/20/2022   Lab Results  Component Value Date   CHOL 148 03/20/2022   HDL 66 03/20/2022   LDLCALC 65 03/20/2022   TRIG 94 03/20/2022   CHOLHDL 2.2 09/02/2021   Lab Results  Component Value Date   VD25OH 57.2 03/20/2022   VD25OH 72.1 09/02/2021   VD25OH 75.1 03/11/2021   Lab Results  Component Value Date   WBC 7.5 09/10/2020   HGB 12.1 09/10/2020   HCT 36.8 09/10/2020   MCV 90 09/10/2020   PLT 273 09/10/2020    Attestation Statements:   Reviewed by clinician on day of visit: allergies, medications, problem list, medical history, surgical history, family history, social history, and previous encounter notes.  I, Kathlene November, BS, CMA, am acting as transcriptionist for [employer], DO.   I have reviewed the above documentation for accuracy and completeness, and I agree with the above. Marjory Sneddon, D.O.  The [name] was signed into law in 2016 which includes the topic of electronic health records.  This provides immediate access to information in MyChart.  This includes consultation notes, operative notes, office notes, lab results and pathology reports.  If you have any questions about what you read please let us know at your next visit so we can discuss your concerns and take corrective action if need be.  We are right here with you.

## 2022-07-21 ENCOUNTER — Ambulatory Visit (INDEPENDENT_AMBULATORY_CARE_PROVIDER_SITE_OTHER): Payer: Medicare Other | Admitting: Physician Assistant

## 2022-07-21 ENCOUNTER — Encounter (INDEPENDENT_AMBULATORY_CARE_PROVIDER_SITE_OTHER): Payer: Self-pay | Admitting: Physician Assistant

## 2022-07-21 VITALS — BP 147/75 | HR 56 | Temp 98.3°F | Ht 64.0 in | Wt 237.2 lb

## 2022-07-21 DIAGNOSIS — E559 Vitamin D deficiency, unspecified: Secondary | ICD-10-CM

## 2022-07-21 DIAGNOSIS — Z7984 Long term (current) use of oral hypoglycemic drugs: Secondary | ICD-10-CM | POA: Diagnosis not present

## 2022-07-21 DIAGNOSIS — E1169 Type 2 diabetes mellitus with other specified complication: Secondary | ICD-10-CM

## 2022-07-21 DIAGNOSIS — I152 Hypertension secondary to endocrine disorders: Secondary | ICD-10-CM | POA: Diagnosis not present

## 2022-07-21 DIAGNOSIS — E1159 Type 2 diabetes mellitus with other circulatory complications: Secondary | ICD-10-CM

## 2022-07-21 DIAGNOSIS — E669 Obesity, unspecified: Secondary | ICD-10-CM

## 2022-07-21 DIAGNOSIS — Z6841 Body Mass Index (BMI) 40.0 and over, adult: Secondary | ICD-10-CM | POA: Insufficient documentation

## 2022-07-30 NOTE — Progress Notes (Unsigned)
Chief Complaint:   [place] is here to discuss her progress with her obesity treatment plan along with follow-up of her obesity related diagnoses. Kathleen [name] is on keeping a food journal and adhering to recommended goals of 1100 calories and 80+ grams of protein and states she is following her eating plan approximately 60% of the time. Kathleen [name] states she is exercising 0 minutes 0 times per week.  Today's visit was #: 78 Starting weight: 259 lbs Starting date: 04/12/2018 Today's weight: 237 lbs Today's date: 07/21/2022 Total lbs lost to date: 22 lbs Total lbs lost since last in-office visit: 0  Interim History: Kathleen [name] has done well with weight loss overall.  She did eat out more frequently/had a friend visiting from [place]. She has been working to get back on track with her nutrition plan and hs done much better over the last week. Daily calories [phone]/Protein 77-102 grams.  She saw her PCP last week and reports PCP felt she was doing well.  She does not feel that she has been exercising as much this year as last and plans to resume walking several times weekly.   Subjective:   1. Type 2 diabetes mellitus with obesity (HCC) On Metformin 500 mg daily.  A1c 5.9 on 03/20/22-at goal.  Fasting blood sugars 120-150's.  Discussed possibly increase metformin to twice a day dosing and she would like to think about this before increasing.  No refills needed.  2. Vitamin D deficiency On ergocalciferol 50K IU weekly.  Denies any side effects.  Vit D level of 57.2-at goal.  No refills needed.  3. Hypertension associated with type 2 diabetes mellitus ([place]) Blood pressure at goal.  Normal renal function.  Denies any side effects with Hygroton and Diovan.   Assessment/Plan:   1. Type 2 diabetes mellitus with obesity ([place]) Continue Prescribed Nutrition Plan and exercise to promote weight loss, and improve glycemic control.   2. Vitamin D deficiency Continue ergocalciferol once  weekly.  3. Hypertension associated with type 2 diabetes mellitus (HCC) Continue medications, Hygroton 50 mg daily and Diovan 80 mg twice a day.  Continue Prescribed Nutrition Plan and exercise to promote weight loss and improve BP control.   4. BMI 40.0-44.9, adult ([place])- Current BMI 40.7  5. Obesity ([place])- starting BMI 44.46 Kathleen [name] is currently in the action stage of change. As such, her goal is to continue with weight loss efforts. She has agreed to keeping a food journal and adhering to recommended goals of 1100 calories and 80+ grams of protein daily.   Exercise goals: Older adults should follow the adult guidelines. When older adults cannot meet the adult guidelines, they should be as physically active as their abilities and conditions will allow.   Behavioral modification strategies: increasing lean protein intake, decreasing simple carbohydrates, decreasing eating out, and planning for success.  Kathleen [name] has agreed to follow-up with our clinic in 4 weeks. She was informed of the importance of frequent follow-up visits to maximize her success with intensive lifestyle modifications for her multiple health conditions.   Objective:   Blood pressure (!) 147/75, pulse (!) 56, temperature 98.3 F (36.8 C), height 5' 4"$  (1.626 m), weight 237 lb 3.2 oz (107.6 kg), SpO2 97 %. Body mass index is 40.72 kg/m.  General: Cooperative, alert, well developed, in no acute distress. HEENT: Conjunctivae and lids unremarkable. Cardiovascular: Regular rhythm.  Lungs: Normal work of breathing. Neurologic: No focal deficits.   Lab Results  Component Value Date   CREATININE  0.86 03/20/2022   BUN 25 03/20/2022   NA 139 03/20/2022   K 4.2 03/20/2022   CL 99 03/20/2022   CO2 23 03/20/2022   Lab Results  Component Value Date   ALT 14 03/20/2022   AST 16 03/20/2022   ALKPHOS 69 03/20/2022   BILITOT 0.3 03/20/2022   Lab Results  Component Value Date   HGBA1C 5.9 (H) 03/20/2022   HGBA1C 6.0  (H) 09/02/2021   HGBA1C 5.7 (H) 03/11/2021   HGBA1C 5.4 09/10/2020   HGBA1C 5.8 (H) 06/05/2020   Lab Results  Component Value Date   INSULIN 14.5 03/20/2022   INSULIN 18.4 09/02/2021   INSULIN 18.4 03/11/2021   INSULIN 14.3 09/10/2020   INSULIN 14.5 06/05/2020   Lab Results  Component Value Date   TSH 1.510 03/20/2022   Lab Results  Component Value Date   CHOL 148 03/20/2022   HDL 66 03/20/2022   LDLCALC 65 03/20/2022   TRIG 94 03/20/2022   CHOLHDL 2.2 09/02/2021   Lab Results  Component Value Date   VD25OH 57.2 03/20/2022   VD25OH 72.1 09/02/2021   VD25OH 75.1 03/11/2021   Lab Results  Component Value Date   WBC 7.5 09/10/2020   HGB 12.1 09/10/2020   HCT 36.8 09/10/2020   MCV 90 09/10/2020   PLT 273 09/10/2020   No results found for: "IRON", "TIBC", "FERRITIN"  Attestation Statements:   Reviewed by clinician on day of visit: allergies, medications, problem list, medical history, surgical history, family history, social history, and previous encounter notes.  Time spent on visit including pre-visit chart review and post-visit care and charting was 30 minutes.   I, Brendell Tyus, am acting as transcriptionist for [employer], PA.  I have reviewed the above documentation for accuracy and completeness, and I agree with the above. -  Roger Fasnacht,PA-C

## 2022-08-11 ENCOUNTER — Encounter (INDEPENDENT_AMBULATORY_CARE_PROVIDER_SITE_OTHER): Payer: Self-pay | Admitting: Physician Assistant

## 2022-08-11 ENCOUNTER — Ambulatory Visit (INDEPENDENT_AMBULATORY_CARE_PROVIDER_SITE_OTHER): Payer: Medicare Other | Admitting: Physician Assistant

## 2022-08-11 VITALS — BP 140/87 | HR 54 | Temp 98.3°F | Ht 64.0 in | Wt 238.2 lb

## 2022-08-11 DIAGNOSIS — E1159 Type 2 diabetes mellitus with other circulatory complications: Secondary | ICD-10-CM

## 2022-08-11 DIAGNOSIS — Z6841 Body Mass Index (BMI) 40.0 and over, adult: Secondary | ICD-10-CM

## 2022-08-11 DIAGNOSIS — Z7984 Long term (current) use of oral hypoglycemic drugs: Secondary | ICD-10-CM | POA: Diagnosis not present

## 2022-08-11 DIAGNOSIS — E669 Obesity, unspecified: Secondary | ICD-10-CM

## 2022-08-11 DIAGNOSIS — E559 Vitamin D deficiency, unspecified: Secondary | ICD-10-CM | POA: Diagnosis not present

## 2022-08-11 NOTE — Progress Notes (Signed)
Chief Complaint:   [name] is here to discuss her progress with her obesity treatment plan along with follow-up of her obesity related diagnoses. Kathleen [name] is on keeping a food journal and adhering to recommended goals of 1100 calories and 80 grams of protein and states she is following her eating plan approximately 75% of the time. Kathleen [name] states she is walking   60-90 minutes 4-5 times per week while working.  Today's visit was #: 71 Starting weight: 259 lbs Starting date: 04/12/2018 Today's weight: 238 lbs Today's date: 08/11/2022 Total lbs lost to date: 21 lbs Total lbs lost since last in-office visit: +1 LB  Interim History: Kathleen [name] has done well with weight loss overall. She had some recent celebrations with granddaughters birthday and other family get-togethers per usual. Her calories are generally between 1000-1295  and protein 70-100 grams daily She notes her hunger is well-controlled except for in the evenings an hour or 2 before she goes to bed. She does have a trip to [facility] with her grandson in late March over spring break  Subjective:   1. Type 2 diabetes mellitus with other circulatory complication, without long-term current use of insulin (HCC) She is on metformin 500 mg daily.  A1c 5.9 on 03/20/2022-at goal.  Fasting blood sugars are from 120- 140 and 2-hour postprandial blood sugars are from 94-110's.  2. Vitamin D deficiency Vitamin D Deficiency Vitamin D is at goal of 50.  Most recent vitamin D level was 57.2. She is on  prescription ergocalciferol 50,000 IU weekly. Lab Results  Component Value Date   VD25OH 57.2 03/20/2022   VD25OH 72.1 09/02/2021   VD25OH 75.1 03/11/2021    Plan: Continue prescription vitamin D 50,000 IU weekly.   3. Obesity ([place])- starting BMI 44.46   4. Obesity,current BMI 40.9    Assessment/Plan:   1. Type 2 diabetes mellitus with other circulatory complication, without long-term current use of  insulin (HCC) We discussed increasing her metformin to 500 mg twice daily to try to help cover the evening hunger a little better.  She is willing to give this a try.  She reports at this point she does not need a refill on the metformin.  Will have her continue to monitor her blood sugars and monitor her hunger/appetite following this change.  She will continue to work on her nutrition plan to promote weight loss and improve glycemic control.  2. Vitamin D deficiency Continue ergocalciferol once weekly and will follow-up vitamin D level over the next 2 to 3 months to avoid oversupplementation.  3. Obesity ([place])- starting BMI 44.46   4. Obesity,current BMI 40.9   Kathleen [name] is currently in the action stage of change. As such, her goal is to continue with weight loss efforts. She has agreed to keeping a food journal and adhering to recommended goals of 1100 calories and 85 grams of protein.   Exercise goals: All adults should avoid inactivity. Some physical activity is better than none, and adults who participate in any amount of physical activity gain some health benefits.  Behavioral modification strategies: increasing lean protein intake, decreasing simple carbohydrates, planning for success, and keeping a strict food journal.  Kathleen [name] has agreed to follow-up with our clinic in 4 weeks. She was informed of the importance of frequent follow-up visits to maximize her success with intensive lifestyle modifications for her multiple health conditions.    Objective:   Blood pressure (!) 140/87, pulse (!) 54, temperature 98.3 F (36.8  C), height '5\' 4"'$  (1.626 m), weight 238 lb 3.2 oz (108 kg), SpO2 97 %. Body mass index is 40.89 kg/m.  General: Cooperative, alert, well developed, in no acute distress. HEENT: Conjunctivae and lids unremarkable. Cardiovascular: Regular rhythm.  Lungs: Normal work of breathing. Neurologic: No focal deficits.   Lab Results  Component Value Date   CREATININE  0.86 03/20/2022   BUN 25 03/20/2022   NA 139 03/20/2022   K 4.2 03/20/2022   CL 99 03/20/2022   CO2 23 03/20/2022   Lab Results  Component Value Date   ALT 14 03/20/2022   AST 16 03/20/2022   ALKPHOS 69 03/20/2022   BILITOT 0.3 03/20/2022   Lab Results  Component Value Date   HGBA1C 5.9 (H) 03/20/2022   HGBA1C 6.0 (H) 09/02/2021   HGBA1C 5.7 (H) 03/11/2021   HGBA1C 5.4 09/10/2020   HGBA1C 5.8 (H) 06/05/2020   Lab Results  Component Value Date   INSULIN 14.5 03/20/2022   INSULIN 18.4 09/02/2021   INSULIN 18.4 03/11/2021   INSULIN 14.3 09/10/2020   INSULIN 14.5 06/05/2020   Lab Results  Component Value Date   TSH 1.510 03/20/2022   Lab Results  Component Value Date   CHOL 148 03/20/2022   HDL 66 03/20/2022   LDLCALC 65 03/20/2022   TRIG 94 03/20/2022   CHOLHDL 2.2 09/02/2021   Lab Results  Component Value Date   VD25OH 57.2 03/20/2022   VD25OH 72.1 09/02/2021   VD25OH 75.1 03/11/2021   Lab Results  Component Value Date   WBC 7.5 09/10/2020   HGB 12.1 09/10/2020   HCT 36.8 09/10/2020   MCV 90 09/10/2020   PLT 273 09/10/2020   No results found for: "IRON", "TIBC", "FERRITIN"  Obesity Behavioral Intervention:   Approximately 15 minutes were spent on the discussion below.  ASK: We discussed the diagnosis of obesity with Kathleen [name] today and Kathleen [name] agreed to give [name] permission to discuss obesity behavioral modification therapy today.  ASSESS: Kathleen [name] has the diagnosis of obesity and her BMI today is 40.9. Kathleen [name] is in the action stage of change.   ADVISE: Kathleen [name] was educated on the multiple health risks of obesity as well as the benefit of weight loss to improve her health. She was advised of the need for long term treatment and the importance of lifestyle modifications to improve her current health and to decrease her risk of future health problems.  AGREE: Multiple dietary modification options and treatment options were discussed and Kathleen [name]  agreed to follow the recommendations documented in the above note.  ARRANGE: Kathleen [name] was educated on the importance of frequent visits to treat obesity as outlined per CMS and USPSTF guidelines and agreed to schedule her next follow up appointment today.  Attestation Statements:   Reviewed by clinician on day of visit: allergies, medications, problem list, medical history, surgical history, family history, social history, and previous encounter notes.  Time spent on visit including pre-visit chart review and post-visit care and charting was 35 minutes.  Kelsye Loomer,PA-C

## 2022-09-08 ENCOUNTER — Other Ambulatory Visit (INDEPENDENT_AMBULATORY_CARE_PROVIDER_SITE_OTHER): Payer: Self-pay | Admitting: Physician Assistant

## 2022-09-08 DIAGNOSIS — E1169 Type 2 diabetes mellitus with other specified complication: Secondary | ICD-10-CM

## 2022-09-11 ENCOUNTER — Ambulatory Visit (INDEPENDENT_AMBULATORY_CARE_PROVIDER_SITE_OTHER): Payer: Medicare Other | Admitting: Physician Assistant

## 2022-09-11 ENCOUNTER — Encounter (INDEPENDENT_AMBULATORY_CARE_PROVIDER_SITE_OTHER): Payer: Self-pay | Admitting: Physician Assistant

## 2022-09-11 VITALS — BP 139/80 | HR 56 | Temp 97.8°F | Ht 64.0 in | Wt 237.0 lb

## 2022-09-11 DIAGNOSIS — E559 Vitamin D deficiency, unspecified: Secondary | ICD-10-CM

## 2022-09-11 DIAGNOSIS — Z7984 Long term (current) use of oral hypoglycemic drugs: Secondary | ICD-10-CM

## 2022-09-11 DIAGNOSIS — E1159 Type 2 diabetes mellitus with other circulatory complications: Secondary | ICD-10-CM | POA: Diagnosis not present

## 2022-09-11 DIAGNOSIS — Z6841 Body Mass Index (BMI) 40.0 and over, adult: Secondary | ICD-10-CM

## 2022-09-11 DIAGNOSIS — E669 Obesity, unspecified: Secondary | ICD-10-CM | POA: Diagnosis not present

## 2022-09-11 DIAGNOSIS — I152 Hypertension secondary to endocrine disorders: Secondary | ICD-10-CM

## 2022-09-11 DIAGNOSIS — E119 Type 2 diabetes mellitus without complications: Secondary | ICD-10-CM | POA: Insufficient documentation

## 2022-09-11 MED ORDER — METFORMIN HCL 500 MG PO TABS: 500.0000 mg | ORAL_TABLET | Freq: Two times a day (BID) | ORAL | 0 refills | Status: DC

## 2022-09-11 NOTE — Assessment & Plan Note (Addendum)
Vitamin D Deficiency Vitamin D is at goal of 50.  Most recent vitamin D level was 57.2. She had been on Ergocalciferol 50,000 IU weekly .  Lab Results  Component Value Date   VD25OH 57.2 03/20/2022   VD25OH 72.1 09/02/2021   VD25OH 75.1 03/11/2021    Plan: Continue prescription vitamin D 50,000 IU weekly. Will plan to recheck vitamin D level over next 2 months to avoid over supplementation.

## 2022-09-11 NOTE — Progress Notes (Signed)
Office: [phone]  /  Fax: [phone]  WEIGHT SUMMARY AND BIOMETRICS  Vitals Temp: 97.8 F (36.6 C) BP: 139/80 Pulse Rate: (!) 56 SpO2: 98 %   Anthropometric Measurements Height: 5\' 4"  (1.626 m) Weight: 237 lb (107.5 kg) BMI (Calculated): 40.66 Weight at Last Visit: 282 lb Weight Lost Since Last Visit: 1 lb Weight Gained Since Last Visit: 0 lb Starting Weight: 298 lb Total Weight Loss (lbs): 22 lb (9.979 kg)   Body Composition  Body Fat %: 50.3 % Fat Mass (lbs): 119.4 lbs Muscle Mass (lbs): 112 lbs Total Body Water (lbs): 80.6 lbs Visceral Fat Rating : 19   Other Clinical Data Fasting: Yes Labs: No Today's Visit #: 28 Starting Date: 11/13/20     HPI  Chief Complaint: OBESITY  Kathleen [name] is here to discuss her progress with her obesity treatment plan. She is on the keeping a food journal and adhering to recommended goals of 1100 calories and 80 grams of protein and states she is following her eating plan approximately 65 % of the time. She states she is exercising /stationary bike 10-15 minutes 2-3 times per week.   Interval History:  Since last office visit she has done well with weight loss. Down 1 lb./ Total of 21 lbs! Good adherence to eating plan. Hunger and appetite well controlled overall.   Has beach trip with husband and 7 yr old grandson next week to [facility]. Discussed travel strategies. Also has church lunches coming up and did indulge in some sweets she made for this, but was mindful and limited to a few bites of treats.   Pharmacotherapy: metformin increased to 500 mg twice daily 1 month ago. No side effects following the increase and feels is helping control hunger in afternoons.   PHYSICAL EXAM:  Blood pressure 139/80, pulse (!) 56, temperature 97.8 F (36.6 C), height 5\' 4"  (1.626 m), weight 237 lb (107.5 kg), SpO2 98 %. Body mass index is 40.68 kg/m.  General: She is overweight, cooperative, alert, well developed, and in no  acute distress. PSYCH: Has normal mood, affect and thought process.   Cardiovascular: regular rhythm Lungs: Normal breathing effort, no conversational dyspnea. Neuro: no focal deficits  DIAGNOSTIC DATA REVIEWED:  BMET    Component Value Date/Time   NA 139 03/20/2022 1207   NA 141 12/14/2014 0747   K 4.2 03/20/2022 1207   K 4.2 12/14/2014 0747   CL 99 03/20/2022 1207   CO2 23 03/20/2022 1207   CO2 28 12/14/2014 0747   GLUCOSE 106 (H) 03/20/2022 1207   GLUCOSE 120 12/14/2014 0747   BUN 25 03/20/2022 1207   BUN 21.3 12/14/2014 0747   CREATININE 0.86 03/20/2022 1207   CREATININE 0.9 12/14/2014 0747   CALCIUM 9.8 03/20/2022 1207   CALCIUM 9.4 12/14/2014 0747   GFRNONAA 73 06/05/2020 1113   GFRAA 84 06/05/2020 1113   Lab Results  Component Value Date   HGBA1C 5.9 (H) 03/20/2022   HGBA1C 6.8 (H) 04/12/2018   Lab Results  Component Value Date   INSULIN 14.5 03/20/2022   INSULIN 30.6 (H) 04/12/2018   Lab Results  Component Value Date   TSH 1.510 03/20/2022   CBC    Component Value Date/Time   WBC 7.5 09/10/2020 1016   WBC 8.0 12/14/2014 0746   WBC 6.1 03/04/2011 1040   RBC 4.07 09/10/2020 1016   RBC 4.17 12/14/2014 0746   RBC 3.99 03/04/2011 1040   HGB 12.1 09/10/2020 1016   HGB 12.4 12/14/2014  0746   HCT 36.8 09/10/2020 1016   HCT 37.0 12/14/2014 0746   PLT 273 09/10/2020 1016   MCV 90 09/10/2020 1016   MCV 88.9 12/14/2014 0746   MCH 29.7 09/10/2020 1016   MCH 29.8 12/14/2014 0746   MCH 30.1 03/04/2011 1040   MCHC 32.9 09/10/2020 1016   MCHC 33.5 12/14/2014 0746   MCHC 33.1 03/04/2011 1040   RDW 12.3 09/10/2020 1016   RDW 12.5 12/14/2014 0746   Iron Studies No results found for: "IRON", "TIBC", "FERRITIN", "IRONPCTSAT" Lipid Panel     Component Value Date/Time   CHOL 148 03/20/2022 1207   TRIG 94 03/20/2022 1207   HDL 66 03/20/2022 1207   CHOLHDL 2.2 09/02/2021 1139   LDLCALC 65 03/20/2022 1207   Hepatic Function Panel     Component Value  Date/Time   PROT 7.6 03/20/2022 1207   PROT 7.1 12/14/2014 0747   ALBUMIN 4.5 03/20/2022 1207   ALBUMIN 3.8 12/14/2014 0747   AST 16 03/20/2022 1207   AST 16 12/14/2014 0747   ALT 14 03/20/2022 1207   ALT 15 12/14/2014 0747   ALKPHOS 69 03/20/2022 1207   ALKPHOS 52 12/14/2014 0747   BILITOT 0.3 03/20/2022 1207   BILITOT 0.40 12/14/2014 0747      Component Value Date/Time   TSH 1.510 03/20/2022 1207   Nutritional Lab Results  Component Value Date   VD25OH 57.2 03/20/2022   VD25OH 72.1 09/02/2021   VD25OH 75.1 03/11/2021    ASSOCIATED CONDITIONS ADDRESSED TODAY  ASSESSMENT AND PLAN  Problem List Items Addressed This Visit     Hypertension associated with type 2 diabetes mellitus ([place])    Hypertension Hypertension well controlled, stable, and no significant medication side effects noted.  Medication(s): valsartan 80mg  daily   Chlorthalidone 50 mg daily    BP at home 123/75, 130/64, 138/72, 118/62. No symptoms of hypotension.   BP Readings from Last 3 Encounters:  09/11/22 139/80  08/11/22 (!) 140/87  07/21/22 (!) 147/75   Lab Results  Component Value Date   CREATININE 0.86 03/20/2022   CREATININE 1.00 09/02/2021   CREATININE 0.76 03/11/2021  No results found for: "GFR"  Plan: Continue all antihypertensives at current dosages. Continue to work on nutrition plan to promote weight loss and improve BP control.        Relevant Medications   metFORMIN (GLUCOPHAGE) 500 MG tablet   Vitamin D deficiency    Vitamin D Deficiency Vitamin D is at goal of 50.  Most recent vitamin D level was 57.2. She had been on Ergocalciferol 50,000 IU weekly .  Lab Results  Component Value Date   VD25OH 57.2 03/20/2022   VD25OH 72.1 09/02/2021   VD25OH 75.1 03/11/2021   Plan: Continue prescription vitamin D 50,000 IU weekly. Will plan to recheck vitamin D level over next 2 months to avoid over supplementation.        BMI 40.0-44.9, adult (HCC)- Current BMI 40.7    Relevant Medications   metFORMIN (GLUCOPHAGE) 500 MG tablet   Obesity ([place])- starting BMI 44.46   Relevant Medications   metFORMIN (GLUCOPHAGE) 500 MG tablet   Diabetes mellitus ([place]) - Primary    Type 2 Diabetes Mellitus with other specified complication, without long-term current use of insulin HgbA1c is at goal. Last A1c was 5.9 CBGs: Fasting 101 to 128 Episodes of hypoglycemia: no Medication(s): Metformin 500 mg twice daily with meals No GI side effects with metformin.   Lab Results  Component Value Date  HGBA1C 5.9 (H) 03/20/2022   HGBA1C 6.0 (H) 09/02/2021   HGBA1C 5.7 (H) 03/11/2021   Lab Results  Component Value Date   LDLCALC 65 03/20/2022   CREATININE 0.86 03/20/2022  No results found for: "GFR"  Plan: Continue and refill Metformin 500 mg twice daily with meals Continue working on nutrition plan to decrease simple carbohydrates, increase lean proteins and exercise to promote weight loss and improve glycemic control.        Relevant Medications   metFORMIN (GLUCOPHAGE) 500 MG tablet      TREATMENT PLAN FOR OBESITY:  Recommended [place] is currently in the action stage of change. As such, her goal is to continue weight management plan. She has agreed to keeping a food journal and adhering to recommended goals of 1100 calories and 85+grams of  protein.  Behavioral Intervention  We discussed the following Behavioral Modification Strategies today: increasing lean protein intake, decreasing simple carbohydrates , increasing vegetables, increasing water intake, work on meal planning and easy cooking plans, planning for success, and keeping healthy foods at home.  Additional resources provided today: NA  Recommended Physical Activity Goals  [name] has been advised to work up to 150 minutes of moderate intensity aerobic activity a week and strengthening exercises 2-3 times per week for cardiovascular health, weight loss maintenance and  preservation of muscle mass.   She has agreed to Continue current level of physical activity    Pharmacotherapy We discussed various medication options to help [name] with her weight loss efforts and we both agreed to continue metformin 500 mg twice daily for Type 2 diabetes.    Return in about 4 weeks (around 10/09/2022).Marland Kitchen She was informed of the importance of frequent follow up visits to maximize her success with intensive lifestyle modifications for her multiple health conditions.   ATTESTASTION STATEMENTS:  Reviewed by clinician on day of visit: allergies, medications, problem list, medical history, surgical history, family history, social history, and previous encounter notes.   I have personally spent 42 minutes total time today in preparation, patient care, nutritional counseling and documentation for this visit, including the following: review of clinical lab tests; review of medical tests/procedures/services.      Kathleen Nordlund, PA-C

## 2022-09-11 NOTE — Assessment & Plan Note (Signed)
Hypertension Hypertension well controlled, stable, and no significant medication side effects noted.  Medication(s): valsartan 80mg  daily   Chlorthalidone 50 mg daily    BP at home 123/75, 130/64, 138/72, 118/62. No symptoms of hypotension.   BP Readings from Last 3 Encounters:  09/11/22 139/80  08/11/22 (!) 140/87  07/21/22 (!) 147/75   Lab Results  Component Value Date   CREATININE 0.86 03/20/2022   CREATININE 1.00 09/02/2021   CREATININE 0.76 03/11/2021   No results found for: "GFR"  Plan: Continue all antihypertensives at current dosages. Continue to work on nutrition plan to promote weight loss and improve BP control.

## 2022-09-11 NOTE — Assessment & Plan Note (Addendum)
Type 2 Diabetes Mellitus with other specified complication, without long-term current use of insulin HgbA1c is at goal. Last A1c was 5.9 CBGs: Fasting 101 to 128 Episodes of hypoglycemia: no Medication(s): Metformin 500 mg twice daily with meals No GI side effects with metformin.   Lab Results  Component Value Date   HGBA1C 5.9 (H) 03/20/2022   HGBA1C 6.0 (H) 09/02/2021   HGBA1C 5.7 (H) 03/11/2021   Lab Results  Component Value Date   LDLCALC 65 03/20/2022   CREATININE 0.86 03/20/2022   No results found for: "GFR"  Plan: Continue and refill Metformin 500 mg twice daily with meals Continue working on nutrition plan to decrease simple carbohydrates, increase lean proteins and exercise to promote weight loss and improve glycemic control.

## 2022-09-24 DIAGNOSIS — G4733 Obstructive sleep apnea (adult) (pediatric): Secondary | ICD-10-CM | POA: Diagnosis not present

## 2022-10-13 ENCOUNTER — Encounter (INDEPENDENT_AMBULATORY_CARE_PROVIDER_SITE_OTHER): Payer: Self-pay | Admitting: Physician Assistant

## 2022-10-13 ENCOUNTER — Ambulatory Visit (INDEPENDENT_AMBULATORY_CARE_PROVIDER_SITE_OTHER): Payer: Medicare Other | Admitting: Physician Assistant

## 2022-10-13 VITALS — BP 147/85 | HR 53 | Temp 97.7°F | Ht 64.0 in | Wt 239.0 lb

## 2022-10-13 DIAGNOSIS — I152 Hypertension secondary to endocrine disorders: Secondary | ICD-10-CM

## 2022-10-13 DIAGNOSIS — E669 Obesity, unspecified: Secondary | ICD-10-CM

## 2022-10-13 DIAGNOSIS — E559 Vitamin D deficiency, unspecified: Secondary | ICD-10-CM

## 2022-10-13 DIAGNOSIS — Z6841 Body Mass Index (BMI) 40.0 and over, adult: Secondary | ICD-10-CM

## 2022-10-13 DIAGNOSIS — Z7984 Long term (current) use of oral hypoglycemic drugs: Secondary | ICD-10-CM | POA: Diagnosis not present

## 2022-10-13 DIAGNOSIS — E1159 Type 2 diabetes mellitus with other circulatory complications: Secondary | ICD-10-CM

## 2022-10-13 MED ORDER — ONETOUCH ULTRA VI STRP: ORAL_STRIP | 3 refills | Status: DC

## 2022-10-13 NOTE — Assessment & Plan Note (Signed)
Hypertension Hypertension asymptomatic, no significant medication side effects noted, borderline controlled, and needs further observation.  Medication(s): Chlorthalidone 50 mg daily   Valsartan 80 mg twice daily   Bisoprolol fumarate 5 mg once daily  She is working on nutrition plan to promote weight loss and improve blood pressure control.  Blood pressures have been up more lately and we discussed watching the sodium in her diet and making sure she is staying adequately hydrated. BP Readings from Last 3 Encounters:  10/13/22 (!) 147/85  09/11/22 139/80  08/11/22 (!) 140/87   Lab Results  Component Value Date   CREATININE 0.86 03/20/2022   CREATININE 1.00 09/02/2021   CREATININE 0.76 03/11/2021   No results found for: "GFR"  Plan: Continue all antihypertensives at current dosages. Continue to work on nutrition plan to promote weight loss and improve BP control.

## 2022-10-13 NOTE — Assessment & Plan Note (Signed)
Type 2 Diabetes Mellitus with circulatory complications, without long-term current use of insulin HgbA1c is at goal. Last A1c was 5.9 CBGs: Fasting 110-130's in am and 91-107 prior to evening meal.  Episodes of hypoglycemia: no Medication(s):  Metformin 500 mg at breakfast and lunchtime daily.  No GI side effects with metformin She is working on nutrition plan to decrease simple carbohydrates, increase lean proteins and exercise to promote weight loss, and improve glycemic control .  Lab Results  Component Value Date   HGBA1C 5.9 (H) 03/20/2022   HGBA1C 6.0 (H) 09/02/2021   HGBA1C 5.7 (H) 03/11/2021   Lab Results  Component Value Date   LDLCALC 65 03/20/2022   CREATININE 0.86 03/20/2022   No results found for: "GFR"  Plan: Continue  metformin 500 mg twice daily at breakfast and lunch.  No refill needed this visit. Continue working on nutrition plan to decrease simple carbohydrates, increase lean proteins and exercise to promote weight loss,and improve glycemic control .

## 2022-10-13 NOTE — Progress Notes (Signed)
Office: [phone]  /  Fax: [phone]  WEIGHT SUMMARY AND BIOMETRICS  Vitals Temp: 97.7 F (36.5 C) BP: (!) 147/85 Pulse Rate: (!) 53 SpO2: 98 %   Anthropometric Measurements Height: 5\' 4"  (1.626 m) Weight: 239 lb (108.4 kg) BMI (Calculated): 41 Weight at Last Visit: 237 lb Weight Lost Since Last Visit: 0 lb Weight Gained Since Last Visit: 2 lb Starting Weight: 298 lb Total Weight Loss (lbs): 20 lb (9.072 kg)   Body Composition  Body Fat %: 50.9 % Fat Mass (lbs): 122 lbs Muscle Mass (lbs): 111.6 lbs Total Body Water (lbs): 83.2 lbs Visceral Fat Rating : 19   Other Clinical Data Fasting: yes Labs: no Today's Visit #: 29 Starting Date: 11/13/20     HPI  Chief Complaint: OBESITY  Kathleen [name] is here to discuss her progress with her obesity treatment plan. She is on the keeping a food journal and adhering to recommended goals of 1100-1200 calories and 85 grams of protein and states she is following her eating plan approximately 70 % of the time. She states she is exercising 0 minutes 0 times per week.   Interval History:  Since last office visit she is up 2 pounds She was at the beach for a week-reports she got off track while at the beach and was actually up almost 6 pounds, but got back on track over the past couple of weeks.  She did have some heavier meals, including some spaghetti this past weekend. Water intake-improved as daughter got her a 40 ounce drinking container and she is trying to do at least 1-1/2 of these daily.  She is drinking the true lime/lemon drinks which have Stevia Calories are generally thousand to 1340 daily/protein is generally 80-100 or better daily. Stress level is up a little as her husband had cataract surgery and is going for the other eye on May 8.  Bioimpedance scale shows her down 0.4 pounds and muscle, up 2.6 pounds adipose mass, and up 3.2 pounds in water mass and we discussed watching sodium intake more closely.  Hunger  and appetite-generally well-controlled. Exercise-we discussed trying to increase her walking by 5 to 10 minutes several times daily.  She had been riding the stationary bike but has found this difficult lately due to some hip pain.  Pharmacotherapy: Metformin 500 mg twice daily, she is taking at breakfast and lunch daily.  No GI side effects with metformin.  PHYSICAL EXAM:  Blood pressure (!) 147/85, pulse (!) 53, temperature 97.7 F (36.5 C), height 5\' 4"  (1.626 m), weight 239 lb (108.4 kg), SpO2 98 %. Body mass index is 41.02 kg/m.  General: She is overweight, cooperative, alert, well developed, and in no acute distress. PSYCH: Has normal mood, affect and thought process.   Cardiovascular: Heart rate in the 50s, blood pressure up today and we discussed monitoring sodium intake and hydration Lungs: Normal breathing effort, no conversational dyspnea. Neuro: No focal deficit  DIAGNOSTIC DATA REVIEWED:  BMET    Component Value Date/Time   NA 139 03/20/2022 1207   NA 141 12/14/2014 0747   K 4.2 03/20/2022 1207   K 4.2 12/14/2014 0747   CL 99 03/20/2022 1207   CO2 23 03/20/2022 1207   CO2 28 12/14/2014 0747   GLUCOSE 106 (H) 03/20/2022 1207   GLUCOSE 120 12/14/2014 0747   BUN 25 03/20/2022 1207   BUN 21.3 12/14/2014 0747   CREATININE 0.86 03/20/2022 1207   CREATININE 0.9 12/14/2014 0747   CALCIUM 9.8 03/20/2022  1207   CALCIUM 9.4 12/14/2014 0747   GFRNONAA 73 06/05/2020 1113   GFRAA 84 06/05/2020 1113   Lab Results  Component Value Date   HGBA1C 5.9 (H) 03/20/2022   HGBA1C 6.8 (H) 04/12/2018   Lab Results  Component Value Date   INSULIN 14.5 03/20/2022   INSULIN 30.6 (H) 04/12/2018   Lab Results  Component Value Date   TSH 1.510 03/20/2022   CBC    Component Value Date/Time   WBC 7.5 09/10/2020 1016   WBC 8.0 12/14/2014 0746   WBC 6.1 03/04/2011 1040   RBC 4.07 09/10/2020 1016   RBC 4.17 12/14/2014 0746   RBC 3.99 03/04/2011 1040   HGB 12.1 09/10/2020 1016    HGB 12.4 12/14/2014 0746   HCT 36.8 09/10/2020 1016   HCT 37.0 12/14/2014 0746   PLT 273 09/10/2020 1016   MCV 90 09/10/2020 1016   MCV 88.9 12/14/2014 0746   MCH 29.7 09/10/2020 1016   MCH 29.8 12/14/2014 0746   MCH 30.1 03/04/2011 1040   MCHC 32.9 09/10/2020 1016   MCHC 33.5 12/14/2014 0746   MCHC 33.1 03/04/2011 1040   RDW 12.3 09/10/2020 1016   RDW 12.5 12/14/2014 0746   Iron Studies No results found for: "IRON", "TIBC", "FERRITIN", "IRONPCTSAT" Lipid Panel     Component Value Date/Time   CHOL 148 03/20/2022 1207   TRIG 94 03/20/2022 1207   HDL 66 03/20/2022 1207   CHOLHDL 2.2 09/02/2021 1139   LDLCALC 65 03/20/2022 1207   Hepatic Function Panel     Component Value Date/Time   PROT 7.6 03/20/2022 1207   PROT 7.1 12/14/2014 0747   ALBUMIN 4.5 03/20/2022 1207   ALBUMIN 3.8 12/14/2014 0747   AST 16 03/20/2022 1207   AST 16 12/14/2014 0747   ALT 14 03/20/2022 1207   ALT 15 12/14/2014 0747   ALKPHOS 69 03/20/2022 1207   ALKPHOS 52 12/14/2014 0747   BILITOT 0.3 03/20/2022 1207   BILITOT 0.40 12/14/2014 0747      Component Value Date/Time   TSH 1.510 03/20/2022 1207   Nutritional Lab Results  Component Value Date   VD25OH 57.2 03/20/2022   VD25OH 72.1 09/02/2021   VD25OH 75.1 03/11/2021    ASSOCIATED CONDITIONS ADDRESSED TODAY  ASSESSMENT AND PLAN  Problem List Items Addressed This Visit     Hypertension associated with type 2 diabetes mellitus (HCC)    Hypertension Hypertension asymptomatic, no significant medication side effects noted, borderline controlled, and needs further observation.  Medication(s): Chlorthalidone 50 mg daily   Valsartan 80 mg twice daily   Bisoprolol fumarate 5 mg once daily  She is working on nutrition plan to promote weight loss and improve blood pressure control.  Blood pressures have been up more lately and we discussed watching the sodium in her diet and making sure she is staying adequately hydrated. BP Readings from  Last 3 Encounters:  10/13/22 (!) 147/85  09/11/22 139/80  08/11/22 (!) 140/87   Lab Results  Component Value Date   CREATININE 0.86 03/20/2022   CREATININE 1.00 09/02/2021   CREATININE 0.76 03/11/2021  No results found for: "GFR"  Plan: Continue all antihypertensives at current dosages. Continue to work on nutrition plan to promote weight loss and improve BP control.         Vitamin D deficiency    Vitamin D Deficiency Vitamin D is at goal of 50.  Most recent vitamin D level was 57.2. She is on  prescription ergocalciferol 50,000 IU weekly.  Lab Results  Component Value Date   VD25OH 57.2 03/20/2022   VD25OH 72.1 09/02/2021   VD25OH 75.1 03/11/2021   Plan: Continue  prescription ergocalciferol 50,000 IU weekly no refill needed this visit Low vitamin D levels can be associated with adiposity and may result in leptin resistance and weight gain. Also associated with fatigue. Currently on vitamin D supplementation without any adverse effects.         BMI 40.0-44.9, adult (HCC)- Current BMI 40.7   Obesity (HCC)- starting BMI 44.46   Diabetes mellitus (HCC) - Primary    Type 2 Diabetes Mellitus with circulatory complications, without long-term current use of insulin HgbA1c is at goal. Last A1c was 5.9 CBGs: Fasting 110-130's in am and 91-107 prior to evening meal.  Episodes of hypoglycemia: no Medication(s):  Metformin 500 mg at breakfast and lunchtime daily.  No GI side effects with metformin She is working on nutrition plan to decrease simple carbohydrates, increase lean proteins and exercise to promote weight loss, and improve glycemic control .  Lab Results  Component Value Date   HGBA1C 5.9 (H) 03/20/2022   HGBA1C 6.0 (H) 09/02/2021   HGBA1C 5.7 (H) 03/11/2021   Lab Results  Component Value Date   LDLCALC 65 03/20/2022   CREATININE 0.86 03/20/2022  No results found for: "GFR"  Plan: Continue  metformin 500 mg twice daily at breakfast and lunch.  No refill  needed this visit. Continue working on nutrition plan to decrease simple carbohydrates, increase lean proteins and exercise to promote weight loss,and improve glycemic control .         Relevant Medications   glucose blood (ONETOUCH ULTRA) test strip  Current BMI 41.1  TREATMENT PLAN FOR OBESITY:  Recommended Dietary Goals  [name] is currently in the action stage of change. As such, her goal is to continue weight management plan. She has agreed to keeping a food journal and adhering to recommended goals of 1100 calories and 80+ grams protein.  Behavioral Intervention  We discussed the following Behavioral Modification Strategies today: increasing lean protein intake, decreasing simple carbohydrates , increasing water intake, work on tracking and journaling calories using tracking application, decreasing sodium intake, continue to practice mindfulness when eating, and planning for success.  Additional resources provided today: NA  Recommended Physical Activity Goals  [name] has been advised to work up to 150 minutes of moderate intensity aerobic activity a week and strengthening exercises 2-3 times per week for cardiovascular health, weight loss maintenance and preservation of muscle mass.   She has agreed to Continue current level of physical activity  and begin walking 5 to 10 minutes 3 times a week   Pharmacotherapy We discussed various medication options to help [name] with her weight loss efforts and we both agreed to continue metformin for type 2 diabetes management and continue to work on nutritional and behavioral strategies to promote weight loss.    Return in about 3 weeks (around 11/03/2022).Marland Kitchen She was informed of the importance of frequent follow up visits to maximize her success with intensive lifestyle modifications for her multiple health conditions.   ATTESTASTION STATEMENTS:  Reviewed by clinician on day of visit: allergies, medications, problem list, medical  history, surgical history, family history, social history, and previous encounter notes.   I have personally spent 40 minutes total time today in preparation, patient care, nutritional counseling and documentation for this visit, including the following: review of clinical lab tests; review of medical tests/procedures/services.      Duard Spiewak,  PA-C

## 2022-10-13 NOTE — Assessment & Plan Note (Signed)
Vitamin D Deficiency Vitamin D is at goal of 50.  Most recent vitamin D level was 57.2. She is on  prescription ergocalciferol 50,000 IU weekly. Lab Results  Component Value Date   VD25OH 57.2 03/20/2022   VD25OH 72.1 09/02/2021   VD25OH 75.1 03/11/2021    Plan: Continue  prescription ergocalciferol 50,000 IU weekly no refill needed this visit Low vitamin D levels can be associated with adiposity and may result in leptin resistance and weight gain. Also associated with fatigue. Currently on vitamin D supplementation without any adverse effects.

## 2022-10-29 ENCOUNTER — Emergency Department (HOSPITAL_COMMUNITY): Payer: Medicare Other

## 2022-10-29 ENCOUNTER — Other Ambulatory Visit: Payer: Self-pay

## 2022-10-29 ENCOUNTER — Emergency Department (HOSPITAL_COMMUNITY)
Admission: EM | Admit: 2022-10-29 | Discharge: 2022-10-29 | Disposition: A | Discharge status: Home / Self Care | Payer: Medicare Other | Attending: Emergency Medicine | Admitting: Emergency Medicine

## 2022-10-29 ENCOUNTER — Encounter (HOSPITAL_COMMUNITY): Payer: Self-pay

## 2022-10-29 DIAGNOSIS — Z853 Personal history of malignant neoplasm of breast: Secondary | ICD-10-CM | POA: Insufficient documentation

## 2022-10-29 DIAGNOSIS — Z87891 Personal history of nicotine dependence: Secondary | ICD-10-CM | POA: Diagnosis not present

## 2022-10-29 DIAGNOSIS — I10 Essential (primary) hypertension: Secondary | ICD-10-CM | POA: Diagnosis not present

## 2022-10-29 DIAGNOSIS — R1031 Right lower quadrant pain: Secondary | ICD-10-CM | POA: Insufficient documentation

## 2022-10-29 DIAGNOSIS — E119 Type 2 diabetes mellitus without complications: Secondary | ICD-10-CM | POA: Insufficient documentation

## 2022-10-29 DIAGNOSIS — Z7984 Long term (current) use of oral hypoglycemic drugs: Secondary | ICD-10-CM | POA: Diagnosis not present

## 2022-10-29 DIAGNOSIS — M47816 Spondylosis without myelopathy or radiculopathy, lumbar region: Secondary | ICD-10-CM | POA: Diagnosis not present

## 2022-10-29 DIAGNOSIS — Z7982 Long term (current) use of aspirin: Secondary | ICD-10-CM | POA: Insufficient documentation

## 2022-10-29 DIAGNOSIS — Z79899 Other long term (current) drug therapy: Secondary | ICD-10-CM | POA: Insufficient documentation

## 2022-10-29 DIAGNOSIS — M25551 Pain in right hip: Secondary | ICD-10-CM | POA: Diagnosis not present

## 2022-10-29 DIAGNOSIS — M549 Dorsalgia, unspecified: Secondary | ICD-10-CM | POA: Diagnosis not present

## 2022-10-29 DIAGNOSIS — M545 Low back pain, unspecified: Secondary | ICD-10-CM | POA: Diagnosis not present

## 2022-10-29 MED ORDER — HYDROCODONE-ACETAMINOPHEN 5-325 MG PO TABS: 1.0000 | ORAL_TABLET | Freq: Four times a day (QID) | ORAL | 0 refills | Status: DC | PRN

## 2022-10-29 MED ORDER — HYDROCODONE-ACETAMINOPHEN 5-325 MG PO TABS
1.0000 | ORAL_TABLET | Freq: Once | ORAL | Status: AC
  Administered 2022-10-29: 1 via ORAL
  Filled 2022-10-29: qty 1

## 2022-10-29 NOTE — ED Provider Notes (Signed)
[facility]-EMERGENCY DEPT Provider Note: Lowella Dell, MD, FACEP  CSN: [id number] MRN: [id number] ARRIVAL: 10/29/22 at 0243 ROOM: WA13/[facility]13   CHIEF COMPLAINT  Hip Pain   HISTORY OF PRESENT ILLNESS  10/29/22 3:11 AM Kathleen [name] is a 82 y.o. female with a history of L4-5 surgery in 2012 for spinal stenosis.  She is here with pain in her right SI joint radiating to her right groin and down the front of her right leg.  The pain is actually more intense anteriorly than posteriorly.  It is worse with movement of her right hip and is making ambulation difficult.  She has some paresthesias on the bottom of her feet, right greater than left but no pain in the left leg or hip.  She denies any saddle anesthesia.  She denies any lower extremity weakness.  She denies any bowel or bladder changes.   Past Medical History:  Diagnosis Date   Arthritis    Back pain    Breast cancer (HCC) 2012   Cancer [facility])    colon   Diabetes mellitus    Family history of breast cancer    Family history of colon cancer    GERD (gastroesophageal reflux disease)    Hyperlipidemia    Hypertension    Joint pain    Kidney problem    Lactose intolerance    Neuromuscular disorder (HCC)    back, hip, leg left side   Obesity    Personal history of radiation therapy 2012   Sleep apnea    Spinal stenosis     Past Surgical History:  Procedure Laterality Date   ABDOMINAL HYSTERECTOMY     BREAST EXCISIONAL BIOPSY Left 1972   BREAST LUMPECTOMY Right 2012   BREAST SURGERY     biopsy   COLON SURGERY     partial colectomy   X-STOP IMPLANTATION  September 26th,2012    Family History  Problem Relation Age of Onset   Breast cancer Mother        dx late 48's   Hyperlipidemia Mother    Heart disease Mother    Liver disease Mother    Heart disease Father    Obesity Father    Hypertension Father    Diabetes Father    Breast cancer Sister 65   Breast cancer Sister 41   Heart disease Maternal Grandmother     Colon cancer Cousin        dx 70+   Kidney cancer Son 70    Social History   Tobacco Use   Smoking status: Former   Smokeless tobacco: Never  Building services engineer Use: Never used  Substance Use Topics   Alcohol use: Yes   Drug use: No    Prior to Admission medications   Medication Sig Start Date End Date Taking? Authorizing Provider  HYDROcodone-acetaminophen (NORCO) 5-325 MG tablet Take 1 tablet by mouth every 6 (six) hours as needed for severe pain or moderate pain (may cause constipation). 10/29/22  Yes Vennela Jutte, MD  aspirin 81 MG tablet Take 81 mg by mouth daily.    [provider]  betamethasone dipropionate (DIPROLENE) 0.05 % cream Apply topically 1 day or 1 dose.    [provider]  bisoprolol (ZEBETA) 5 MG tablet Take 1 tablet (5 mg total) by mouth daily. 01/05/20   Alois Cliche, PA-C  Blood Glucose Monitoring Suppl (ONE TOUCH ULTRA 2) w/Device KIT Use to check blood sugar two times a day 09/10/20  Alois Cliche, PA-C  chlorthalidone (HYGROTON) 50 MG tablet Take 1 tablet (50 mg total) by mouth daily. 01/05/20   Alois Cliche, PA-C  fexofenadine (ALLEGRA) 180 MG tablet Take 180 mg by mouth daily.     [provider]  Glucosamine HCl 1500 MG TABS Take 2 tablets by mouth daily.    [provider]  glucose blood (ONETOUCH ULTRA) test strip USE TO check blood glucose TWICE DAILY 10/13/22   Rayburn, Fanny Bien, PA-C  ibuprofen (ADVIL,MOTRIN) 200 MG tablet Take 200 mg by mouth every 6 (six) hours as needed.    [provider]  [place] Lanius 300 MG CAPS Take by mouth daily.    [provider]  Lancets Letta Pate ULTRASOFT) lancets Use to check blood glucose two times daily 03/02/19   Alois Cliche, PA-C  Magnesium 250 MG TABS Take 1 tablet by mouth daily.    [provider]  METAMUCIL FIBER PO Take by mouth.    [provider]  metFORMIN (GLUCOPHAGE) 500 MG tablet Take 1 tablet (500 mg total) by mouth 2  (two) times daily with a meal. 09/11/22   Rayburn, Fanny Bien, PA-C  omeprazole (PRILOSEC) 20 MG capsule Take 20 mg by mouth daily.    [provider]  omeprazole-sodium bicarbonate (ZEGERID) 40-1100 MG per capsule Take 1 capsule by mouth. Take one tablet by mouth at bedtime as needed    [provider]  Probiotic Product (PROBIOTIC DAILY PO) Take by mouth.    [provider]  simvastatin (ZOCOR) 40 MG tablet Take 1 tablet (40 mg total) by mouth at bedtime. 01/05/20   Alois Cliche, PA-C  valsartan (DIOVAN) 80 MG tablet Take 1 tablet (80 mg total) by mouth 2 (two) times daily. 03/22/20   Alois Cliche, PA-C  Vitamin D, Ergocalciferol, (DRISDOL) 1.25 MG (50000 UNIT) CAPS capsule Take 50,000 Units by mouth every 7 (seven) days.    [provider]    Allergies Meloxicam, Naproxen sodium, Tape, Aleve [naproxen], Doxycycline, and Doxycycline calcium   REVIEW OF SYSTEMS  Negative except as noted here or in the History of Present Illness.   PHYSICAL EXAMINATION  Initial Vital Signs Blood pressure (!) 188/84, pulse (!) 58, temperature 98 F (36.7 C), temperature source Oral, resp. rate 16, height 5\' 4"  (1.626 m), weight 106.6 kg, SpO2 94 %.  Examination General: Well-developed, well-nourished female in no acute distress; appearance consistent with age of record HENT: normocephalic; atraumatic Eyes: Normal appearance Neck: supple Heart: regular rate and rhythm Lungs: clear to auscultation bilaterally Abdomen: soft; nondistended; nontender; bowel sounds present Back: Right SI tenderness; pain on straight leg raise at about 30 degrees Extremities: No deformity; full range of motion; pulses normal Neurologic: Awake, alert and oriented; motor function intact in all extremities and symmetric; altered sensation of sole of right foot; no facial droop Skin: Warm and dry Psychiatric: Normal mood and affect   RESULTS  Summary of this visit's results,  reviewed and interpreted by myself:   EKG Interpretation  Date/Time:    Ventricular Rate:    PR Interval:    QRS Duration:   QT Interval:    QTC Calculation:   R Axis:     Text Interpretation:         Laboratory Studies: No results found for this or any previous visit (from the past 24 hour(s)). Imaging Studies: CT PELVIS WO CONTRAST  Result Date: 10/29/2022 CLINICAL DATA:  82 year old female with persistent low back pain radiating to the right hip  after helping lifter sister. EXAM: CT PELVIS WITHOUT CONTRAST TECHNIQUE: Multidetector CT imaging of the pelvis was performed following the standard protocol without intravenous contrast. RADIATION DOSE REDUCTION: This exam was performed according to the departmental dose-optimization program which includes automated exposure control, adjustment of the mA and/or kV according to patient size and/or use of iterative reconstruction technique. COMPARISON:  Lumbar spine CT today reported separately. CT Abdomen and Pelvis 08/08/2021. FINDINGS: Urinary Tract: Kidneys warm or included on the lumbar spine comparison today and grossly negative. No evidence of hydroureter. Unremarkable bladder. Occasional chronic pelvic phleboliths. Bowel: Chronic rectosigmoid anastomosis on series 3, image 38 with no adverse features. Midline cecum appears to be on a lax mesentery. Normal appendix on series 3, image 6 to the right of midline. No dilated bowel loops. No free air or free fluid identified. Vascular/Lymphatic: Mild Aortoiliac calcified atherosclerosis. Vascular patency is not evaluated in the absence of IV contrast. No pelvic lymphadenopathy. Reproductive: Chronically absent uterus, diminutive or absent ovaries. Other:  No pelvis free fluid. Musculoskeletal: Lumbar spine detailed separately. Mild bilateral vacuum SI joint phenomena has increased from last year. Sacrum, coccygeal segments, and SI joints appear stable and intact. Pelvis appears stable and intact.  Femoral heads remain normally located. Hip joint spaces are stable from last year, fairly symmetric. Proximal femurs appear stable and intact. No superficial soft tissue injury identified. IMPRESSION: 1. No fracture, traumatic injury, or other acute process identified in the Pelvis. 2. Lumbar spine CT reported separately. Electronically Signed   By: Odessa Fleming M.D.   On: 10/29/2022 04:32   CT Lumbar Spine Wo Contrast  Result Date: 10/29/2022 CLINICAL DATA:  82 year old female with persistent low back pain radiating to the right hip after helping lifter sister. EXAM: CT LUMBAR SPINE WITHOUT CONTRAST TECHNIQUE: Multidetector CT imaging of the lumbar spine was performed without intravenous contrast administration. Multiplanar CT image reconstructions were also generated. RADIATION DOSE REDUCTION: This exam was performed according to the departmental dose-optimization program which includes automated exposure control, adjustment of the mA and/or kV according to patient size and/or use of iterative reconstruction technique. COMPARISON:  CT pelvis today reported separately. CT Abdomen and Pelvis 08/08/2021. FINDINGS: Segmentation: Normal. Alignment: Chronic anterolisthesis of L4 on L5, nearly grade 2 measuring 7-8 mm is stable from last year. Superimposed subtle retrolisthesis of L5 on S1 and mildly exaggerated upper lumbar lordosis. Superimposed subtle dextroconvex lumbar scoliosis. Vertebrae: Stable vertebral height, no evidence of compression fracture. Grossly intact visible lower thoracic levels. Lumbar levels appear intact. Grossly intact visible sacrum and SI joints. No acute osseous abnormality identified. Paraspinal and other soft tissues: Mild Aortoiliac calcified atherosclerosis. Normal caliber abdominal aorta. Negative visible other noncontrast abdominal viscera. Retained contrast or calcification within a sigmoid colon diverticula in the presacral space series 9, image 112. Evidence of normal appendix on  series 9, image 74. Lumbar paraspinal soft tissues are within normal limits. Disc levels: Diffuse chronic disc and endplate degeneration. No significant lower thoracic spinal stenosis through T12-L1. Near diffuse lumbar vacuum disc has increased from last year. Multifactorial lumbar spinal stenosis: Mild to moderate at L2-L3, stable. Moderate to severe at L3-L4 (series 9, image 63) in part related to facet and ligament flavum hypertrophy, stable. Moderate to severe at L4-L5 despite evidence of previous laminectomy there. Severe residual facet hypertrophy superimposed on chronic spondylolisthesis. Moderate bilateral L4 neural foraminal stenosis. This level appears stable. L5-S1 no spinal stenosis but prominent vacuum facet disease which has progressed. IMPRESSION: 1. No acute osseous abnormality in the  Lumbar Spine. Chronic grade 2 anterolisthesis of L4 on L5. 2. Diffuse lumbar spine degeneration with progressed vacuum disc and vacuum facet phenomena since last year. But multifactorial lumbar spinal stenosis (moderate to severe at both L3-L4 and L4-L5) appears stable at all levels. Electronically Signed   By: Odessa Fleming M.D.   On: 10/29/2022 04:28    ED COURSE and MDM  Nursing notes, initial and subsequent vitals signs, including pulse oximetry, reviewed and interpreted by myself.  Vitals:   10/29/22 0254 10/29/22 0255  BP: (!) 188/84   Pulse: (!) 58   Resp: 16   Temp: 98 F (36.7 C)   TempSrc: Oral   SpO2: 94%   Weight:  106.6 kg  Height:  5\' 4"  (1.626 m)   Medications  HYDROcodone-acetaminophen (NORCO/VICODIN) 5-325 MG per tablet 1 tablet (has no administration in time range)    The patient's pain pattern suggest sacroiliitis given its presence in the sacroiliac joint radiating to the right groin but it is unusual for this to radiate down the front of the right leg.  The pattern of pain also does not match the usual dermatomal pattern of lumbar radiculopathy.  She is neurologically intact with no  signs or symptoms to suggest cauda equina syndrome.  We will treat her pain with a short course of narcotic medication (she was warned of the risk of constipation) and refer to neurosurgery if symptoms persist.  PROCEDURES  Procedures   ED DIAGNOSES     ICD-10-CM   1. Back pain with radiation  M54.9          Gladiola Madore, Jonny Ruiz, MD 10/29/22 [phone]

## 2022-10-29 NOTE — ED Triage Notes (Signed)
BIBA from home for Rt hip pain that started Tue morning, injured it while helping her sister into a car, vss 150 Mcq Fentanyl 18 LAC Placed on 2 LNC after getting fentanyl

## 2022-11-03 ENCOUNTER — Ambulatory Visit (INDEPENDENT_AMBULATORY_CARE_PROVIDER_SITE_OTHER): Payer: Medicare Other | Admitting: Physician Assistant

## 2022-11-03 ENCOUNTER — Encounter (INDEPENDENT_AMBULATORY_CARE_PROVIDER_SITE_OTHER): Payer: Self-pay | Admitting: Physician Assistant

## 2022-11-03 VITALS — BP 158/79 | HR 60 | Temp 98.0°F | Ht 64.0 in | Wt 236.0 lb

## 2022-11-03 DIAGNOSIS — Z7984 Long term (current) use of oral hypoglycemic drugs: Secondary | ICD-10-CM | POA: Diagnosis not present

## 2022-11-03 DIAGNOSIS — E559 Vitamin D deficiency, unspecified: Secondary | ICD-10-CM

## 2022-11-03 DIAGNOSIS — I152 Hypertension secondary to endocrine disorders: Secondary | ICD-10-CM

## 2022-11-03 DIAGNOSIS — E1159 Type 2 diabetes mellitus with other circulatory complications: Secondary | ICD-10-CM | POA: Diagnosis not present

## 2022-11-03 DIAGNOSIS — Z6841 Body Mass Index (BMI) 40.0 and over, adult: Secondary | ICD-10-CM

## 2022-11-03 NOTE — Progress Notes (Unsigned)
.smr  Office: [phone]  /  Fax: [phone]  WEIGHT SUMMARY AND BIOMETRICS  Vitals Temp: 98 F (36.7 C) BP: (!) 158/79 Pulse Rate: 60 SpO2: 97 %   Anthropometric Measurements Height: 5\' 4"  (1.626 m) Weight: 236 lb (107 kg) BMI (Calculated): 40.49 Weight at Last Visit: 239 lb   Body Composition  Body Fat %: 51.5 % Fat Mass (lbs): 122 lbs Muscle Mass (lbs): 109 lbs Total Body Water (lbs): 80.6 lbs Visceral Fat Rating : 19   Other Clinical Data Fasting: no Labs: no Today's Visit #: 30 Starting Date: 11/13/20     HPI  Chief Complaint: OBESITY  Kathleen [name] is here to discuss her progress with her obesity treatment plan. She is on the keeping a food journal and adhering to recommended goals of 1100-1200 calories and 85 grams of protein and states she is following her eating plan approximately 60 % of the time. She states she is exercising 0 minutes 0 times per week.   Interval History:  Since last office visit she is down 3 lbs.  She has been struggling with a back injury/sciatica on the right side for the past week.  She is currently taking Prednisone and pain medications.  She presents in a wheelchair with her husband today.  Hunger/appetite-not excessive. Calories [phone]/ Protein 50-97 grams daily.  Cravings- Some comfort eating with back injury/steroid medications.  Some celebration eating with birthdays and Mother's day Stress- increased with back injury, but manageable Exercise-unable to do much following back injury  Planning to go to the beach next week for several days.    Pharmacotherapy: metformin for Type 2 diabetes. No GI side effects with metformin.    TREATMENT PLAN FOR OBESITY:  Recommended Dietary Goals  Kathleen [name] is currently in the action stage of change. As such, her goal is to continue weight management plan. She has agreed to keeping a food journal and adhering to recommended goals of 1100-1200 calories and 85+ grams of  protein.  Behavioral Intervention  We discussed the following Behavioral Modification Strategies today: increasing lean protein intake, decreasing simple carbohydrates , increasing vegetables, increasing lower glycemic fruits, increasing fiber rich foods, avoiding skipping meals, increasing water intake, continue to practice mindfulness when eating, and planning for success.  Additional resources provided today: NA  Recommended Physical Activity Goals  Kathleen [name] has been advised to work up to 150 minutes of moderate intensity aerobic activity a week and strengthening exercises 2-3 times per week for cardiovascular health, weight loss maintenance and preservation of muscle mass.   She has agreed to Continue current level of physical activity and increase as she tolerates with back pain.    Pharmacotherapy We discussed various medication options to help Kathleen [name] with her weight loss efforts and we both agreed to continue metformin for Type 2 diabetes.    Return in about 4 weeks (around 12/01/2022).Marland Kitchen She was informed of the importance of frequent follow up visits to maximize her success with intensive lifestyle modifications for her multiple health conditions.  PHYSICAL EXAM:  Blood pressure (!) 158/79, pulse 60, temperature 98 F (36.7 C), height 5\' 4"  (1.626 m), weight 236 lb (107 kg), SpO2 97 %. Body mass index is 40.51 kg/m.  General: She is overweight, cooperative, alert, well developed, and in no acute distress.Using wheelchair for mobility due to back pain.  PSYCH: Has normal mood, affect and thought process.   Cardiovascular: HR 60's, BP up today, but reports in pain and having to use wheelchair for mobility today.  Lungs:  Normal breathing effort, no conversational dyspnea.  DIAGNOSTIC DATA REVIEWED:  BMET    Component Value Date/Time   NA 139 03/20/2022 1207   NA 141 12/14/2014 0747   K 4.2 03/20/2022 1207   K 4.2 12/14/2014 0747   CL 99 03/20/2022 1207   CO2 23  03/20/2022 1207   CO2 28 12/14/2014 0747   GLUCOSE 106 (H) 03/20/2022 1207   GLUCOSE 120 12/14/2014 0747   BUN 25 03/20/2022 1207   BUN 21.3 12/14/2014 0747   CREATININE 0.86 03/20/2022 1207   CREATININE 0.9 12/14/2014 0747   CALCIUM 9.8 03/20/2022 1207   CALCIUM 9.4 12/14/2014 0747   GFRNONAA 73 06/05/2020 1113   GFRAA 84 06/05/2020 1113   Lab Results  Component Value Date   HGBA1C 5.9 (H) 03/20/2022   HGBA1C 6.8 (H) 04/12/2018   Lab Results  Component Value Date   INSULIN 14.5 03/20/2022   INSULIN 30.6 (H) 04/12/2018   Lab Results  Component Value Date   TSH 1.510 03/20/2022   CBC    Component Value Date/Time   WBC 7.5 09/10/2020 1016   WBC 8.0 12/14/2014 0746   WBC 6.1 03/04/2011 1040   RBC 4.07 09/10/2020 1016   RBC 4.17 12/14/2014 0746   RBC 3.99 03/04/2011 1040   HGB 12.1 09/10/2020 1016   HGB 12.4 12/14/2014 0746   HCT 36.8 09/10/2020 1016   HCT 37.0 12/14/2014 0746   PLT 273 09/10/2020 1016   MCV 90 09/10/2020 1016   MCV 88.9 12/14/2014 0746   MCH 29.7 09/10/2020 1016   MCH 29.8 12/14/2014 0746   MCH 30.1 03/04/2011 1040   MCHC 32.9 09/10/2020 1016   MCHC 33.5 12/14/2014 0746   MCHC 33.1 03/04/2011 1040   RDW 12.3 09/10/2020 1016   RDW 12.5 12/14/2014 0746   Iron Studies No results found for: "IRON", "TIBC", "FERRITIN", "IRONPCTSAT" Lipid Panel     Component Value Date/Time   CHOL 148 03/20/2022 1207   TRIG 94 03/20/2022 1207   HDL 66 03/20/2022 1207   CHOLHDL 2.2 09/02/2021 1139   LDLCALC 65 03/20/2022 1207   Hepatic Function Panel     Component Value Date/Time   PROT 7.6 03/20/2022 1207   PROT 7.1 12/14/2014 0747   ALBUMIN 4.5 03/20/2022 1207   ALBUMIN 3.8 12/14/2014 0747   AST 16 03/20/2022 1207   AST 16 12/14/2014 0747   ALT 14 03/20/2022 1207   ALT 15 12/14/2014 0747   ALKPHOS 69 03/20/2022 1207   ALKPHOS 52 12/14/2014 0747   BILITOT 0.3 03/20/2022 1207   BILITOT 0.40 12/14/2014 0747      Component Value Date/Time   TSH  1.510 03/20/2022 1207   Nutritional Lab Results  Component Value Date   VD25OH 57.2 03/20/2022   VD25OH 72.1 09/02/2021   VD25OH 75.1 03/11/2021    ASSOCIATED CONDITIONS ADDRESSED TODAY  ASSESSMENT AND PLAN  Problem List Items Addressed This Visit     Hypertension associated with type 2 diabetes mellitus (HCC)   Vitamin D deficiency   BMI 40.0-44.9, adult (HCC)- Current BMI 40.7   Obesity (HCC)- starting BMI 44.46   Diabetes mellitus (HCC) - Primary   Type 2 Diabetes Mellitus with other specified complication, without long-term current use of insulin HgbA1c is at goal. Last A1c was 5.9- stable and improved overall.  CBGs: Fasting higher lately following steroids for back pain- 120-130, now 170-180 Episodes of hypoglycemia: no Medication(s): Metformin 500 mg twice daily with meals No GI side effects with metformin.  She is working on nutrition plan to decrease simple carbohydrates, increase lean proteins and exercise to promote weight loss and improve glycemic control .   Lab Results  Component Value Date   HGBA1C 5.9 (H) 03/20/2022   HGBA1C 6.0 (H) 09/02/2021   HGBA1C 5.7 (H) 03/11/2021   Lab Results  Component Value Date   LDLCALC 65 03/20/2022   CREATININE 0.86 03/20/2022   No results found for: "GFR"  Plan: Continue Metformin 500 mg twice daily with meals No refill needed this visit.  Continue working on nutrition plan to decrease simple carbohydrates, increase lean proteins and exercise to promote weight loss and improve glycemic control.    Hypertension Hypertension asymptomatic, no significant medication side effects noted, borderline controlled, needs further observation, and needs improvement.  Medication(s): valsartan 80 mg twice daily   Chlorthalidone 50 mg daily     BP Readings from Last 3 Encounters:  11/03/22 (!) 158/79  10/29/22 (!) 188/84  10/13/22 (!) 147/85   Lab Results  Component Value Date   CREATININE 0.86 03/20/2022   CREATININE 1.00  09/02/2021   CREATININE 0.76 03/11/2021   No results found for: "GFR"  Plan: Continue all antihypertensives at current dosages. Continue to work on nutrition plan to promote weight loss and improve BP control.  Monitor closely.  Vitamin D Deficiency Vitamin D is at goal of 50.  Most recent vitamin D level was 57.2. She is on  prescription ergocalciferol 50,000 IU weekly. No side effects with Ergocalciferol.  Lab Results  Component Value Date   VD25OH 57.2 03/20/2022   VD25OH 72.1 09/02/2021   VD25OH 75.1 03/11/2021    Plan: Continue  prescription ergocalciferol 50,000 IU weekly No refill needed this visit.  Low vitamin D levels can be associated with adiposity and may result in leptin resistance and weight gain. Also associated with fatigue. Currently on vitamin D supplementation without any adverse effects.      ATTESTASTION STATEMENTS:  Reviewed by clinician on day of visit: allergies, medications, problem list, medical history, surgical history, family history, social history, and previous encounter notes.   I have personally spent 38 minutes total time today in preparation, patient care, nutritional counseling and documentation for this visit, including the following: review of clinical lab tests; review of medical tests/procedures/services.      Noorah Giammona, PA-C

## 2022-11-05 DIAGNOSIS — M25551 Pain in right hip: Secondary | ICD-10-CM | POA: Diagnosis not present

## 2022-11-11 ENCOUNTER — Emergency Department (HOSPITAL_COMMUNITY): Payer: Medicare Other

## 2022-11-11 ENCOUNTER — Other Ambulatory Visit: Payer: Self-pay

## 2022-11-11 ENCOUNTER — Encounter (HOSPITAL_COMMUNITY): Payer: Self-pay

## 2022-11-11 ENCOUNTER — Emergency Department (HOSPITAL_COMMUNITY)
Admission: EM | Admit: 2022-11-11 | Discharge: 2022-11-11 | Disposition: A | Discharge status: Home / Self Care | Payer: Medicare Other | Attending: Emergency Medicine | Admitting: Emergency Medicine

## 2022-11-11 DIAGNOSIS — M5431 Sciatica, right side: Secondary | ICD-10-CM | POA: Diagnosis not present

## 2022-11-11 DIAGNOSIS — M5126 Other intervertebral disc displacement, lumbar region: Secondary | ICD-10-CM | POA: Diagnosis not present

## 2022-11-11 DIAGNOSIS — M5441 Lumbago with sciatica, right side: Secondary | ICD-10-CM | POA: Diagnosis not present

## 2022-11-11 DIAGNOSIS — M47816 Spondylosis without myelopathy or radiculopathy, lumbar region: Secondary | ICD-10-CM | POA: Diagnosis not present

## 2022-11-11 DIAGNOSIS — Z7982 Long term (current) use of aspirin: Secondary | ICD-10-CM | POA: Diagnosis not present

## 2022-11-11 DIAGNOSIS — M48061 Spinal stenosis, lumbar region without neurogenic claudication: Secondary | ICD-10-CM | POA: Diagnosis not present

## 2022-11-11 DIAGNOSIS — M25551 Pain in right hip: Secondary | ICD-10-CM | POA: Diagnosis not present

## 2022-11-11 MED ORDER — OXYCODONE-ACETAMINOPHEN 5-325 MG PO TABS
1.0000 | ORAL_TABLET | Freq: Once | ORAL | Status: AC
  Administered 2022-11-11: 1 via ORAL
  Filled 2022-11-11: qty 1

## 2022-11-11 MED ORDER — LIDOCAINE 5 % EX PTCH: 1.0000 | MEDICATED_PATCH | CUTANEOUS | 0 refills | Status: DC

## 2022-11-11 MED ORDER — OXYCODONE-ACETAMINOPHEN 5-325 MG PO TABS: 1.0000 | ORAL_TABLET | Freq: Three times a day (TID) | ORAL | 0 refills | Status: DC | PRN

## 2022-11-11 NOTE — Discharge Instructions (Addendum)
Return for any problem.   Follow-up closely with neurosurgery and orthopedics as instructed.  Use Percocet as prescribed.  Take this with caution.  This product does have Tylenol.  Do not take more than 1000 mg of Tylenol at 1 time.  Use MiraLAX to help prevent constipation associated with narcotic use.

## 2022-11-11 NOTE — ED Provider Notes (Signed)
[place] EMERGENCY DEPARTMENT AT [facility] Provider Note   CSN: [id number] Arrival date & time: 11/11/22  4098     History  Chief Complaint  Patient presents with   Hip Pain    Kathleen [name] is a 82 y.o. female.  81-year female with prior medical history as detailed below presents for evaluation.  Patient complains of persistent right low back, right hip, right lower extremity pain.  This is been an ongoing issue for the last 2 weeks.  Patient with distant history of spinal cord stenosis requiring surgery.  This occurred approximately 12 years ago.  Seen for same complaint approximately 1 week ago by ED.  She has also followed up with her PCP.  She completed a course of prednisone yesterday.  She reports that prednisone did not seem to improve her pain significantly.  She reports moderate pain control with previously prescribed hydrocodone.  Pain is described as constant.  Pain radiates from the right low back into the right buttock and into the right heel.  She complains of tingling numbness in the right heel.  Denies weakness.  She denies difficulty with bowel movements or bladder control.  She denies fever.   The history is provided by the patient and medical records.       Home Medications Prior to Admission medications   Medication Sig Start Date End Date Taking? Authorizing Provider  aspirin 81 MG tablet Take 81 mg by mouth daily.    [provider]  betamethasone dipropionate (DIPROLENE) 0.05 % cream Apply topically 1 day or 1 dose.    [provider]  bisoprolol (ZEBETA) 5 MG tablet Take 1 tablet (5 mg total) by mouth daily. 01/05/20   Alois Cliche, PA-C  Blood Glucose Monitoring Suppl (ONE TOUCH ULTRA 2) w/Device KIT Use to check blood sugar two times a day 09/10/20   Alois Cliche, PA-C  chlorthalidone (HYGROTON) 50 MG tablet Take 1 tablet (50 mg total) by mouth daily. 01/05/20   Alois Cliche, PA-C  fexofenadine (ALLEGRA) 180  MG tablet Take 180 mg by mouth daily.     [provider]  Glucosamine HCl 1500 MG TABS Take 2 tablets by mouth daily.    [provider]  glucose blood (ONETOUCH ULTRA) test strip USE TO check blood glucose TWICE DAILY 10/13/22   Rayburn, Fanny Bien, PA-C  HYDROcodone-acetaminophen (NORCO) 5-325 MG tablet Take 1 tablet by mouth every 6 (six) hours as needed for severe pain or moderate pain (may cause constipation). 10/29/22   Molpus, John, MD  ibuprofen (ADVIL,MOTRIN) 200 MG tablet Take 200 mg by mouth every 6 (six) hours as needed. Patient not taking: Reported on 11/03/2022    [provider]  Boris Lown Oil 300 MG CAPS Take by mouth daily.    [provider]  Lancets Letta Pate ULTRASOFT) lancets Use to check blood glucose two times daily 03/02/19   Alois Cliche, PA-C  Magnesium 250 MG TABS Take 1 tablet by mouth daily.    [provider]  METAMUCIL FIBER PO Take by mouth.    [provider]  metFORMIN (GLUCOPHAGE) 500 MG tablet Take 1 tablet (500 mg total) by mouth 2 (two) times daily with a meal. 09/11/22   Rayburn, Fanny Bien, PA-C  omeprazole (PRILOSEC) 20 MG capsule Take 20 mg by mouth daily.    [provider]  omeprazole-sodium bicarbonate (ZEGERID) 40-1100 MG per capsule Take 1 capsule by mouth. Take one tablet by mouth at bedtime as needed  [provider]  predniSONE (DELTASONE) 10 MG tablet Take 10 mg by mouth. 10/30/22   [provider]  Probiotic Product (PROBIOTIC DAILY PO) Take by mouth.    [provider]  simvastatin (ZOCOR) 40 MG tablet Take 1 tablet (40 mg total) by mouth at bedtime. 01/05/20   Alois Cliche, PA-C  valsartan (DIOVAN) 80 MG tablet Take 1 tablet (80 mg total) by mouth 2 (two) times daily. 03/22/20   Alois Cliche, PA-C  Vitamin D, Ergocalciferol, (DRISDOL) 1.25 MG (50000 UNIT) CAPS capsule Take 50,000 Units by mouth every 7 (seven) days.    [provider]       Allergies    Meloxicam, Naproxen sodium, Tape, Doxycycline, Doxycycline calcium, and Naproxen    Review of Systems   Review of Systems  All other systems reviewed and are negative.   Physical Exam Updated Vital Signs BP (!) 197/78   Pulse 66   Temp 98.5 F (36.9 C)   Resp 20   Wt 107 kg   SpO2 96%   BMI 40.49 kg/m  Physical Exam Vitals and nursing note reviewed.  Constitutional:      General: She is not in acute distress.    Appearance: Normal appearance. She is well-developed.  HENT:     Head: Normocephalic and atraumatic.  Eyes:     Conjunctiva/sclera: Conjunctivae normal.     Pupils: Pupils are equal, round, and reactive to light.  Cardiovascular:     Rate and Rhythm: Normal rate and regular rhythm.     Heart sounds: Normal heart sounds.  Pulmonary:     Effort: Pulmonary effort is normal. No respiratory distress.     Breath sounds: Normal breath sounds.  Abdominal:     General: There is no distension.     Palpations: Abdomen is soft.     Tenderness: There is no abdominal tenderness.  Musculoskeletal:        General: No deformity. Normal range of motion.     Cervical back: Normal range of motion and neck supple.     Comments: 5 out of 5 strength of both lower extremities.  Distal right lower and left lower extremities are neurovascular intact.  Patient with moderate tenderness overlying the right mid buttock.  No midline spinal tenderness.  Skin:    General: Skin is warm and dry.  Neurological:     General: No focal deficit present.     Mental Status: She is alert and oriented to person, place, and time.     ED Results / Procedures / Treatments   Labs (all labs ordered are listed, but only abnormal results are displayed) Labs Reviewed - No data to display  EKG None  Radiology MR LUMBAR SPINE WO CONTRAST  Result Date: 11/11/2022 CLINICAL DATA:  Low back pain, prior surgery, new symptoms. Lifting injury 15 days ago, now with right lower back pain  radiating down right leg and associated right foot numbness. EXAM: MRI LUMBAR SPINE WITHOUT CONTRAST TECHNIQUE: Multiplanar, multisequence MR imaging of the lumbar spine was performed. No intravenous contrast was administered. COMPARISON:  Lumbar spine CT 10/29/2022. MRI lumbar spine 12/22/2010. FINDINGS: Segmentation: Conventional numbering is assumed with 5 non-rib-bearing, lumbar type vertebral bodies. Alignment:  Unchanged grade 1/2 anterolisthesis of L4 on L5. Vertebrae: Prior L4-5 laminectomy. Normal vertebral body heights. Modic type 1 degenerative endplate marrow signal changes at L1-2 and L4-5. Conus medullaris and cauda equina: Conus extends to the T12-L1 level. Conus and cauda equina appear normal. Paraspinal and other  soft tissues: Fatty atrophy of the paraspinal muscles. Disc levels: T12-L1: Small disc bulge and mild bilateral facet arthropathy. No spinal canal stenosis or neural foraminal narrowing. L1-L2: Small disc bulge and mild bilateral facet arthropathy. No spinal canal stenosis or neural foraminal narrowing. L2-L3: Left eccentric disc bulge and left-greater-than-right facet arthropathy results in mild spinal canal stenosis and mild left neural foraminal narrowing. L3-L4: Central disc-osteophyte complex and moderate bilateral facet arthropathy results in moderate spinal canal stenosis, moderate right and mild left neural foraminal narrowing. L4-L5: Prior laminectomy. Anterolisthesis and severe bilateral facet arthropathy results in moderate-to-severe spinal canal stenosis, moderate left and mild right neural foraminal narrowing. L5-S1: Disc bulge and left-greater-than-right facet arthropathy results in compression of the traversing left S1 nerve root in the left lateral recess. No significant neural foraminal narrowing. IMPRESSION: 1. Multilevel lumbar spondylosis, worst at L4-5, where there is to-severe spinal canal stenosis despite prior laminectomy. Moderate left neural foraminal narrowing at  this level. 2. At L3-4, moderate spinal canal stenosis and moderate right neural foraminal narrowing. 3. At L5-S1, compression of the traversing left S1 nerve root in the left lateral recess. 4. Mild spinal canal stenosis at L2-3. Electronically Signed   By: Orvan Falconer M.D.   On: 11/11/2022 10:22   DG Hip Unilat W or Wo Pelvis 2-3 Views Right  Result Date: 11/11/2022 CLINICAL DATA:  Two-week history of right hip pain EXAM: DG HIP (WITH OR WITHOUT PELVIS) 3V RIGHT COMPARISON:  CT pelvis dated 10/29/2022 FINDINGS: There is no evidence of hip fracture or dislocation. Degenerative changes of the partially imaged lumbar spine and bilateral hips. IMPRESSION: 1. No acute fracture or dislocation. 2. Degenerative changes of the partially imaged lumbar spine and bilateral hips. Electronically Signed   By: Agustin Cree M.D.   On: 11/11/2022 08:35    Procedures Procedures    Medications Ordered in ED Medications  oxyCODONE-acetaminophen (PERCOCET/ROXICET) 5-325 MG per tablet 1 tablet (1 tablet Oral Given 11/11/22 4098)    ED Course/ Medical Decision Making/ A&P                             Medical Decision Making Amount and/or Complexity of Data Reviewed Radiology: ordered.  Risk Prescription drug management.    Medical Screen Complete  This patient presented to the ED with complaint of right hip and right leg pain.  This complaint involves an extensive number of treatment options. The initial differential diagnosis includes, but is not limited to, sacroiliitis, sciatica, nerve impingement, arthritis, etc.  This presentation is: Acute, Chronic, Self-Limited, Previously Undiagnosed, Uncertain Prognosis, and Complicated  Patient is presenting with persistent right lower extremity pain.  Patient's pain is perhaps most consistent with sciatica.  Prior visit included evaluation and imaging.  Patient without significant change of symptoms per her report.  Repeat imaging today is without  significant acute abnormality.  MRI imaging specifically without clear causative correlation with the patient's reported symptoms.  Patient has already arranged for neurosurgical spine follow-up later this week.  Importance of close follow-up is repeatedly stressed.  Strict return precautions given and understood.   Additional history obtained: External records from outside sources obtained and reviewed including prior ED visits and prior Inpatient records.   Imaging Studies ordered:  I ordered imaging studies including plain films of right hip.  MRI lumbar spine  I agree with the radiologist interpretation.  Medicines ordered:  I ordered medication including Percocet for pain Reevaluation of the patient  after these medicines showed that the patient: improved   Problem List / ED Course:  Sciatica   Reevaluation:  After the interventions noted above, I reevaluated the patient and found that they have: improved   Disposition:  After consideration of the diagnostic results and the patients response to treatment, I feel that the patent would benefit from close outpatient followup.          Final Clinical Impression(s) / ED Diagnoses Final diagnoses:  Sciatica of right side    Rx / DC Orders ED Discharge Orders     None         Wynetta Fines, MD 11/11/22 1052

## 2022-11-11 NOTE — ED Triage Notes (Signed)
C/o right hip pain x2 weeks after helping sister into Car.  Pt prescribed hydrocodone and prednisone recently after being seen for same with no improvement.  Tender on palpation and radiates down right legs with numbness in right foot.

## 2022-11-11 NOTE — ED Notes (Signed)
Pt to MRI at this time.

## 2022-11-17 DIAGNOSIS — M545 Low back pain, unspecified: Secondary | ICD-10-CM | POA: Diagnosis not present

## 2022-12-01 ENCOUNTER — Ambulatory Visit (INDEPENDENT_AMBULATORY_CARE_PROVIDER_SITE_OTHER): Payer: Medicare Other | Admitting: Physician Assistant

## 2022-12-01 ENCOUNTER — Encounter (INDEPENDENT_AMBULATORY_CARE_PROVIDER_SITE_OTHER): Payer: Self-pay | Admitting: Physician Assistant

## 2022-12-01 VITALS — BP 148/85 | HR 57 | Temp 98.4°F | Ht 64.0 in | Wt 237.0 lb

## 2022-12-01 DIAGNOSIS — Z7985 Long-term (current) use of injectable non-insulin antidiabetic drugs: Secondary | ICD-10-CM | POA: Diagnosis not present

## 2022-12-01 DIAGNOSIS — E559 Vitamin D deficiency, unspecified: Secondary | ICD-10-CM | POA: Diagnosis not present

## 2022-12-01 DIAGNOSIS — Z7984 Long term (current) use of oral hypoglycemic drugs: Secondary | ICD-10-CM | POA: Diagnosis not present

## 2022-12-01 DIAGNOSIS — E669 Obesity, unspecified: Secondary | ICD-10-CM

## 2022-12-01 DIAGNOSIS — I10 Essential (primary) hypertension: Secondary | ICD-10-CM | POA: Diagnosis not present

## 2022-12-01 DIAGNOSIS — E785 Hyperlipidemia, unspecified: Secondary | ICD-10-CM

## 2022-12-01 DIAGNOSIS — E1159 Type 2 diabetes mellitus with other circulatory complications: Secondary | ICD-10-CM

## 2022-12-01 DIAGNOSIS — Z6841 Body Mass Index (BMI) 40.0 and over, adult: Secondary | ICD-10-CM

## 2022-12-01 MED ORDER — ONETOUCH ULTRASOFT LANCETS MISC: 0 refills | Status: DC

## 2022-12-01 MED ORDER — VITAMIN D (ERGOCALCIFEROL) 1.25 MG (50000 UNIT) PO CAPS
50000.0000 [IU] | ORAL_CAPSULE | ORAL | 0 refills | Status: DC
Start: 2022-12-01 — End: 2023-03-11

## 2022-12-01 MED ORDER — VALSARTAN 80 MG PO TABS: 80.0000 mg | ORAL_TABLET | Freq: Two times a day (BID) | ORAL | 0 refills | Status: DC

## 2022-12-01 MED ORDER — BISOPROLOL FUMARATE 5 MG PO TABS: 5.0000 mg | ORAL_TABLET | Freq: Every day | ORAL | 0 refills | Status: DC

## 2022-12-01 MED ORDER — CHLORTHALIDONE 50 MG PO TABS: 50.0000 mg | ORAL_TABLET | Freq: Every day | ORAL | 0 refills | Status: DC

## 2022-12-01 MED ORDER — ONETOUCH ULTRA VI STRP: ORAL_STRIP | 3 refills | Status: DC

## 2022-12-01 MED ORDER — SIMVASTATIN 40 MG PO TABS: 40.0000 mg | ORAL_TABLET | Freq: Every day | ORAL | 0 refills | Status: DC

## 2022-12-01 MED ORDER — METFORMIN HCL 500 MG PO TABS: 500.0000 mg | ORAL_TABLET | Freq: Two times a day (BID) | ORAL | 0 refills | Status: DC

## 2022-12-01 NOTE — Progress Notes (Signed)
.smr  Office: [phone]  /  Fax: [phone]  WEIGHT SUMMARY AND BIOMETRICS  Vitals Temp: 98.4 F (36.9 C) BP: (!) 148/85 Pulse Rate: (!) 57 SpO2: 98 %   Anthropometric Measurements Height: 5\' 4"  (1.626 m) Weight: 237 lb (107.5 kg) BMI (Calculated): 40.66 Weight at Last Visit: 236 lb Weight Lost Since Last Visit: 0 lb Weight Gained Since Last Visit: 1 lb Starting Weight: 298 lb   Body Composition  Body Fat %: 50.9 % Fat Mass (lbs): 120.6 lbs Muscle Mass (lbs): 110.6 lbs Total Body Water (lbs): 82.6 lbs Visceral Fat Rating : 19   Other Clinical Data Fasting: yes Labs: no Today's Visit #: 31 Starting Date: 11/13/20    Chief Complaint: OBESITY  Kathleen [name] is here to discuss her progress with her obesity treatment plan. She is on the keeping a food journal and adhering to recommended goals of 1100-1200 calories and 85 grams protein and states she is following her eating plan approximately 50 % of the time. She states she is exercising 0 minutes 0 times per week.   Interval History:  Since last office visit she is up 1 pound Reviewed bioimpedance scale with the patient: Up 1.6 pounds and muscle mass Down to pounds and adipose  She is continuing to struggle with back pain and is on another course of prednisone.  She was seen by orthopedic surgery-Dr. Darrelyn Hillock.  She has degenerative changes of her spine and reports she is significantly improved on oral prednisone but will likely have another course of steroids.  She is also been taking Tylenol twice daily as needed for pain and this seems to be helping as well.  This has affected her mobility significantly but she is much improved from previous visit and presents today without an assistive device.  Hunger/appetite-moderate control Cravings-denies excessive Stress-increased with recent back pain but she is significantly improved and pleased with her progress overall.  She does have a sister who has been undergoing  treatment again for breast cancer. Sleep-improving with improvement in her back pain overall.  She is taking Tylenol arthritis strength twice a day and this does seem to be helping  Exercise-limited due to back pain  She is going to be seeing her primary care doctor at [facility]-Dr. Tiburcio Pea and will have fasting labs and annual visit in August.  She asked if we can fill her medications in the interim   Pharmacotherapy: Metformin for type 2 diabetes.  No GI side effects with metformin  TREATMENT PLAN FOR OBESITY:  Recommended Dietary Goals  Kathleen [name] is currently in the action stage of change. As such, her goal is to continue weight management plan. She has agreed to keeping a food journal and adhering to recommended goals of 1100-1200 calories and 85 grams of  protein.  Behavioral Intervention  We discussed the following Behavioral Modification Strategies today: increasing lean protein intake, decreasing simple carbohydrates , increasing vegetables, increasing lower glycemic fruits, increasing water intake, work on tracking and journaling calories using tracking application, emotional eating strategies and understanding the difference between hunger signals and cravings, continue to practice mindfulness when eating, and planning for success.  Additional resources provided today: NA  Recommended Physical Activity Goals  Kathleen [name] has been advised to work up to 150 minutes of moderate intensity aerobic activity a week and strengthening exercises 2-3 times per week for cardiovascular health, weight loss maintenance and preservation of muscle mass.   She has agreed to Continue current level of physical activity  and Increase physical activity  in their day and reduce sedentary time (increase NEAT).   Pharmacotherapy We discussed various medication options to help Kathleen [name] with her weight loss efforts and we both agreed to continue metformin for type 2 diabetes.    Return in about 5 weeks (around  01/05/2023).Marland Kitchen She was informed of the importance of frequent follow up visits to maximize her success with intensive lifestyle modifications for her multiple health conditions.  PHYSICAL EXAM:  Blood pressure (!) 148/85, pulse (!) 57, temperature 98.4 F (36.9 C), height 5\' 4"  (1.626 m), weight 237 lb (107.5 kg), SpO2 98 %. Body mass index is 40.68 kg/m.  General: She is overweight, cooperative, alert, well developed, and in no acute distress. PSYCH: Has normal mood, affect and thought process.   Cardiovascular: HR 50s, BP 148/85 Lungs: Normal breathing effort, no conversational dyspnea. Neuro: No focal deficits. Walking without assistive devices today. Antalgic appearing gait.   DIAGNOSTIC DATA REVIEWED:  BMET    Component Value Date/Time   NA 139 03/20/2022 1207   NA 141 12/14/2014 0747   K 4.2 03/20/2022 1207   K 4.2 12/14/2014 0747   CL 99 03/20/2022 1207   CO2 23 03/20/2022 1207   CO2 28 12/14/2014 0747   GLUCOSE 106 (H) 03/20/2022 1207   GLUCOSE 120 12/14/2014 0747   BUN 25 03/20/2022 1207   BUN 21.3 12/14/2014 0747   CREATININE 0.86 03/20/2022 1207   CREATININE 0.9 12/14/2014 0747   CALCIUM 9.8 03/20/2022 1207   CALCIUM 9.4 12/14/2014 0747   GFRNONAA 73 06/05/2020 1113   GFRAA 84 06/05/2020 1113   Lab Results  Component Value Date   HGBA1C 5.9 (H) 03/20/2022   HGBA1C 6.8 (H) 04/12/2018   Lab Results  Component Value Date   INSULIN 14.5 03/20/2022   INSULIN 30.6 (H) 04/12/2018   Lab Results  Component Value Date   TSH 1.510 03/20/2022   CBC    Component Value Date/Time   WBC 7.5 09/10/2020 1016   WBC 8.0 12/14/2014 0746   WBC 6.1 03/04/2011 1040   RBC 4.07 09/10/2020 1016   RBC 4.17 12/14/2014 0746   RBC 3.99 03/04/2011 1040   HGB 12.1 09/10/2020 1016   HGB 12.4 12/14/2014 0746   HCT 36.8 09/10/2020 1016   HCT 37.0 12/14/2014 0746   PLT 273 09/10/2020 1016   MCV 90 09/10/2020 1016   MCV 88.9 12/14/2014 0746   MCH 29.7 09/10/2020 1016   MCH  29.8 12/14/2014 0746   MCH 30.1 03/04/2011 1040   MCHC 32.9 09/10/2020 1016   MCHC 33.5 12/14/2014 0746   MCHC 33.1 03/04/2011 1040   RDW 12.3 09/10/2020 1016   RDW 12.5 12/14/2014 0746   Iron Studies No results found for: "IRON", "TIBC", "FERRITIN", "IRONPCTSAT" Lipid Panel     Component Value Date/Time   CHOL 148 03/20/2022 1207   TRIG 94 03/20/2022 1207   HDL 66 03/20/2022 1207   CHOLHDL 2.2 09/02/2021 1139   LDLCALC 65 03/20/2022 1207   Hepatic Function Panel     Component Value Date/Time   PROT 7.6 03/20/2022 1207   PROT 7.1 12/14/2014 0747   ALBUMIN 4.5 03/20/2022 1207   ALBUMIN 3.8 12/14/2014 0747   AST 16 03/20/2022 1207   AST 16 12/14/2014 0747   ALT 14 03/20/2022 1207   ALT 15 12/14/2014 0747   ALKPHOS 69 03/20/2022 1207   ALKPHOS 52 12/14/2014 0747   BILITOT 0.3 03/20/2022 1207   BILITOT 0.40 12/14/2014 0747      Component Value  Date/Time   TSH 1.510 03/20/2022 1207   Nutritional Lab Results  Component Value Date   VD25OH 57.2 03/20/2022   VD25OH 72.1 09/02/2021   VD25OH 75.1 03/11/2021    ASSOCIATED CONDITIONS ADDRESSED TODAY  ASSESSMENT AND PLAN  Problem List Items Addressed This Visit     Vitamin D deficiency   Relevant Medications   Vitamin D, Ergocalciferol, (DRISDOL) 1.25 MG (50000 UNIT) CAPS capsule   BMI 40.0-44.9, adult (HCC)- Current BMI 40.7   Relevant Medications   metFORMIN (GLUCOPHAGE) 500 MG tablet   Obesity (HCC)- starting BMI 44.46   Relevant Medications   metFORMIN (GLUCOPHAGE) 500 MG tablet   Diabetes mellitus (HCC) - Primary   Relevant Medications   glucose blood (ONETOUCH ULTRA) test strip   Lancets (ONETOUCH ULTRASOFT) lancets   metFORMIN (GLUCOPHAGE) 500 MG tablet   simvastatin (ZOCOR) 40 MG tablet   valsartan (DIOVAN) 80 MG tablet   Type 2 diabetes mellitus with hyperlipidemia (HCC)   Relevant Medications   bisoprolol (ZEBETA) 5 MG tablet   chlorthalidone (HYGROTON) 50 MG tablet   metFORMIN (GLUCOPHAGE) 500 MG  tablet   simvastatin (ZOCOR) 40 MG tablet   valsartan (DIOVAN) 80 MG tablet   Essential hypertension   Relevant Medications   bisoprolol (ZEBETA) 5 MG tablet   chlorthalidone (HYGROTON) 50 MG tablet   simvastatin (ZOCOR) 40 MG tablet   valsartan (DIOVAN) 80 MG tablet  Type 2 Diabetes Mellitus with other specified complication, without long-term current use of insulin HgbA1c is at goal. Last A1c was 5.9 CBGs: Fasting 80-120's, up with steroids initially to 130's Episodes of hypoglycemia: no Medication(s): Metformin 500 mg twice daily with meals  Lab Results  Component Value Date   HGBA1C 5.9 (H) 03/20/2022   HGBA1C 6.0 (H) 09/02/2021   HGBA1C 5.7 (H) 03/11/2021   Lab Results  Component Value Date   LDLCALC 65 03/20/2022   CREATININE 0.86 03/20/2022   No results found for: "GFR"  Plan: Continue and refill Metformin 500 mg twice daily with meals She will continue to work on her nutrition plan and exercise to promote weight loss and improve glycemic control.  Hyperlipidemia LDL is at goal. Medication(s): Simvastatin 40 mg daily Cardiovascular risk factors: advanced age (older than 40 for men, 53 for women), diabetes mellitus, dyslipidemia, hypertension, obesity (BMI >= 30 kg/m2), and sedentary lifestyle  Lab Results  Component Value Date   CHOL 148 03/20/2022   HDL 66 03/20/2022   LDLCALC 65 03/20/2022   TRIG 94 03/20/2022   CHOLHDL 2.2 09/02/2021   CHOLHDL 2.5 03/11/2021   CHOLHDL 2.4 09/10/2020   Lab Results  Component Value Date   ALT 14 03/20/2022   AST 16 03/20/2022   ALKPHOS 69 03/20/2022   BILITOT 0.3 03/20/2022   The ASCVD Risk score (Arnett DK, et al., 2019) failed to calculate for the following reasons:   The 2019 ASCVD risk score is only valid for ages 18 to 24  Plan: Continue/ Refill Zocor. Continue to work on nutrition plan -decreasing simple carbohydrates, increasing lean proteins, decreasing saturated fats and cholesterol , avoiding trans fats and  exercise as able to promote weight loss, improve lipids and decrease cardiovascular risks.  Hypertension Hypertension no significant medication side effects noted, borderline controlled, needs further observation, and needs improvement.  Medication(s): Bisoprolol 5 mg daily chlorthalidone 50 mg daily valsartan 80 mg twice daily  BP Readings from Last 3 Encounters:  12/01/22 (!) 148/85  11/11/22 (!) 197/78  11/03/22 (!) 158/79   Lab  Results  Component Value Date   CREATININE 0.86 03/20/2022   CREATININE 1.00 09/02/2021   CREATININE 0.76 03/11/2021   No results found for: "GFR"  Plan: Continue/Refill all antihypertensives at current dosages.Bisoprolol 5 mg daily chlorthalidone 50 mg daily valsartan 80 mg twice daily. Continue to work on nutrition plan to promote weight loss and improve BP control.   Vitamin D Deficiency Vitamin D is at goal of 50.  Most recent vitamin D level was 57.2. She is on  prescription ergocalciferol 50,000 IU weekly. Lab Results  Component Value Date   VD25OH 57.2 03/20/2022   VD25OH 72.1 09/02/2021   VD25OH 75.1 03/11/2021    Plan: Continue and refill  prescription ergocalciferol 50,000 IU weekly Low vitamin D levels can be associated with adiposity and may result in leptin resistance and weight gain. Also associated with fatigue. Currently on vitamin D supplementation without any adverse effects.  Recheck vitamin D with PCP at [facility] in August.     ATTESTASTION STATEMENTS:  Reviewed by clinician on day of visit: allergies, medications, problem list, medical history, surgical history, family history, social history, and previous encounter notes.   I have personally spent 48 minutes total time today in preparation, patient care, nutritional counseling and documentation for this visit, including the following: review of clinical lab tests; review of medical tests/procedures/services.      Larken Urias, PA-C

## 2022-12-11 ENCOUNTER — Other Ambulatory Visit: Payer: Self-pay | Admitting: Family Medicine

## 2022-12-11 DIAGNOSIS — Z1231 Encounter for screening mammogram for malignant neoplasm of breast: Secondary | ICD-10-CM

## 2022-12-24 DIAGNOSIS — H52223 Regular astigmatism, bilateral: Secondary | ICD-10-CM | POA: Diagnosis not present

## 2022-12-24 DIAGNOSIS — E7800 Pure hypercholesterolemia, unspecified: Secondary | ICD-10-CM | POA: Diagnosis not present

## 2022-12-24 DIAGNOSIS — E119 Type 2 diabetes mellitus without complications: Secondary | ICD-10-CM | POA: Diagnosis not present

## 2022-12-24 DIAGNOSIS — H53143 Visual discomfort, bilateral: Secondary | ICD-10-CM | POA: Diagnosis not present

## 2022-12-24 DIAGNOSIS — I10 Essential (primary) hypertension: Secondary | ICD-10-CM | POA: Diagnosis not present

## 2022-12-24 DIAGNOSIS — H524 Presbyopia: Secondary | ICD-10-CM | POA: Diagnosis not present

## 2022-12-24 DIAGNOSIS — T7840XD Allergy, unspecified, subsequent encounter: Secondary | ICD-10-CM | POA: Diagnosis not present

## 2022-12-24 DIAGNOSIS — H5201 Hypermetropia, right eye: Secondary | ICD-10-CM | POA: Diagnosis not present

## 2022-12-24 LAB — HM DIABETES EYE EXAM

## 2023-01-05 ENCOUNTER — Ambulatory Visit (INDEPENDENT_AMBULATORY_CARE_PROVIDER_SITE_OTHER): Payer: Medicare Other | Admitting: Physician Assistant

## 2023-01-08 DIAGNOSIS — G4733 Obstructive sleep apnea (adult) (pediatric): Secondary | ICD-10-CM | POA: Diagnosis not present

## 2023-01-14 ENCOUNTER — Ambulatory Visit (INDEPENDENT_AMBULATORY_CARE_PROVIDER_SITE_OTHER): Payer: Medicare Other | Admitting: Physician Assistant

## 2023-01-25 NOTE — Progress Notes (Signed)
.smr  Office: [phone]  /  Fax: [phone]  WEIGHT SUMMARY AND BIOMETRICS  Vitals Temp: 97.8 F (36.6 C) BP: (!) 164/70 Pulse Rate: (!) 57 SpO2: 97 %   Anthropometric Measurements Height: 5\' 4"  (1.626 m) Weight: 238 lb (108 kg) BMI (Calculated): 40.83 Weight at Last Visit: 237lb Weight Lost Since Last Visit: 1lb Weight Gained Since Last Visit: 0lb Starting Weight: 298lb Total Weight Loss (lbs): 60 lb (27.2 kg)   Body Composition  Body Fat %: 51.5 % Fat Mass (lbs): 122.6 lbs Muscle Mass (lbs): 109.8 lbs Total Body Water (lbs): 84.4 lbs Visceral Fat Rating : 19   Other Clinical Data Fasting: Yes Labs: No Today's Visit #: 32 Starting Date: 11/13/20     HPI  Chief Complaint: OBESITY  Kathleen [name] is here to discuss her progress with her obesity treatment plan. She is on the keeping a food journal and adhering to recommended goals of 1100-1200 calories and 85 grams of protein and states she is following her eating plan approximately 60 % of the time. She states she is exercising 0 minutes 0 times per week.   Interval History:  Since last office visit she up 1 lb.  Journals regularly and better adherence to nutrition plan since returning from vacation. Calories 1000-1300/ Protein 70-116 She traveled a bit over the past few weeks with family to [place]. And to [place] and enjoyed the vacation. Her back pain continues to improve after 3 courses of oral steroids .  She is seeing her PCP this week and plans to discuss possible course of Physical therapy to progress further with her back and get her endurance and strength back.  Hunger/appetite-moderate control Cravings- not excessive.   Sleep- Using CPAP and doing okay with this .  Exercise-limited by back pain and plans to discuss possible PT with PCP Hydration-adequate  Fasting labs obtained today.  She was informed we would discuss her lab results at her next visit unless there is a critical issue that needs to  be addressed sooner. She agreed to keep her next visit at the agreed upon time to discuss these results.    Pharmacotherapy: Metformin for type 2 diabetes. No GI side effects with metformin   TREATMENT PLAN FOR OBESITY:  Recommended Dietary Goals  Kathleen [name] is currently in the action stage of change. As such, her goal is to continue weight management plan. She has agreed to keeping a food journal and adhering to recommended goals of 1100-1200 calories and 85 grams of protein.  Behavioral Intervention  We discussed the following Behavioral Modification Strategies today: increasing lean protein intake, decreasing simple carbohydrates , increasing vegetables, increasing lower glycemic fruits, increasing fiber rich foods, increasing water intake, decreasing eating out or consumption of processed foods, and making healthy choices when eating convenient foods, emotional eating strategies and understanding the difference between hunger signals and cravings, continue to practice mindfulness when eating, and planning for success.  Additional resources provided today: NA  Recommended Physical Activity Goals  Kathleen [name] has been advised to work up to 150 minutes of moderate intensity aerobic activity a week and strengthening exercises 2-3 times per week for cardiovascular health, weight loss maintenance and preservation of muscle mass.   She has agreed to Continue current level of physical activity  and Think about ways to increase daily physical activity and overcoming barriers to exercise   Pharmacotherapy We discussed various medication options to help Kathleen [name] with her weight loss efforts and we both agreed to continue metformin for Type  2 diabetes.    No follow-ups on file.Marland Kitchen She was informed of the importance of frequent follow up visits to maximize her success with intensive lifestyle modifications for her multiple health conditions.  PHYSICAL EXAM:  Blood pressure (!) 164/70, pulse (!) 57,  temperature 97.8 F (36.6 C), height 5\' 4"  (1.626 m), weight 238 lb (108 kg), SpO2 97%. Body mass index is 40.85 kg/m.  General: She is overweight, cooperative, alert, well developed, and in no acute distress. PSYCH: Has normal mood, affect and thought process.  Cardiovascular: HR 50's BP 164/70  Lungs: Normal breathing effort, no conversational dyspnea.  DIAGNOSTIC DATA REVIEWED:  BMET    Component Value Date/Time   NA 139 03/20/2022 1207   NA 141 12/14/2014 0747   K 4.2 03/20/2022 1207   K 4.2 12/14/2014 0747   CL 99 03/20/2022 1207   CO2 23 03/20/2022 1207   CO2 28 12/14/2014 0747   GLUCOSE 106 (H) 03/20/2022 1207   GLUCOSE 120 12/14/2014 0747   BUN 25 03/20/2022 1207   BUN 21.3 12/14/2014 0747   CREATININE 0.86 03/20/2022 1207   CREATININE 0.9 12/14/2014 0747   CALCIUM 9.8 03/20/2022 1207   CALCIUM 9.4 12/14/2014 0747   GFRNONAA 73 06/05/2020 1113   GFRAA 84 06/05/2020 1113   Lab Results  Component Value Date   HGBA1C 5.9 (H) 03/20/2022   HGBA1C 6.8 (H) 04/12/2018   Lab Results  Component Value Date   INSULIN 14.5 03/20/2022   INSULIN 30.6 (H) 04/12/2018   Lab Results  Component Value Date   TSH 1.510 03/20/2022   CBC    Component Value Date/Time   WBC 7.5 09/10/2020 1016   WBC 8.0 12/14/2014 0746   WBC 6.1 03/04/2011 1040   RBC 4.07 09/10/2020 1016   RBC 4.17 12/14/2014 0746   RBC 3.99 03/04/2011 1040   HGB 12.1 09/10/2020 1016   HGB 12.4 12/14/2014 0746   HCT 36.8 09/10/2020 1016   HCT 37.0 12/14/2014 0746   PLT 273 09/10/2020 1016   MCV 90 09/10/2020 1016   MCV 88.9 12/14/2014 0746   MCH 29.7 09/10/2020 1016   MCH 29.8 12/14/2014 0746   MCH 30.1 03/04/2011 1040   MCHC 32.9 09/10/2020 1016   MCHC 33.5 12/14/2014 0746   MCHC 33.1 03/04/2011 1040   RDW 12.3 09/10/2020 1016   RDW 12.5 12/14/2014 0746   Iron Studies No results found for: "IRON", "TIBC", "FERRITIN", "IRONPCTSAT" Lipid Panel     Component Value Date/Time   CHOL 148  03/20/2022 1207   TRIG 94 03/20/2022 1207   HDL 66 03/20/2022 1207   CHOLHDL 2.2 09/02/2021 1139   LDLCALC 65 03/20/2022 1207   Hepatic Function Panel     Component Value Date/Time   PROT 7.6 03/20/2022 1207   PROT 7.1 12/14/2014 0747   ALBUMIN 4.5 03/20/2022 1207   ALBUMIN 3.8 12/14/2014 0747   AST 16 03/20/2022 1207   AST 16 12/14/2014 0747   ALT 14 03/20/2022 1207   ALT 15 12/14/2014 0747   ALKPHOS 69 03/20/2022 1207   ALKPHOS 52 12/14/2014 0747   BILITOT 0.3 03/20/2022 1207   BILITOT 0.40 12/14/2014 0747      Component Value Date/Time   TSH 1.510 03/20/2022 1207   Nutritional Lab Results  Component Value Date   VD25OH 57.2 03/20/2022   VD25OH 72.1 09/02/2021   VD25OH 75.1 03/11/2021    ASSOCIATED CONDITIONS ADDRESSED TODAY  ASSESSMENT AND PLAN  Problem List Items Addressed This Visit  OSA on CPAP   Vitamin D deficiency   Obesity (HCC)- starting BMI 44.46   Diabetes mellitus (HCC) - Primary   Type 2 diabetes mellitus with hyperlipidemia (HCC)   Essential hypertension   Type 2 Diabetes Mellitus with other specified complication, without long-term current use of insulin HgbA1c is at goal. Last A1c was 5.9 CBGs: Fasting 80-120's Episodes of hypoglycemia: no Medication(s): Metformin 500 mg twice daily with meals  Lab Results  Component Value Date   HGBA1C 5.9 (H) 03/20/2022   HGBA1C 6.0 (H) 09/02/2021   HGBA1C 5.7 (H) 03/11/2021   Lab Results  Component Value Date   LDLCALC 65 03/20/2022   CREATININE 0.86 03/20/2022   No results found for: "GFR"  Plan: Continue Metformin 500 mg twice daily with meals Recheck fasting labs today.  Continue working on nutrition plan to decrease simple carbohydrates, increase lean proteins and exercise to promote weight loss and improve glycemic control. She is also a candidate for GLP-1/GIP medications . Await labs results and can discuss further at next visit.    Hyperlipidemia LDL is at goal. Medication(s):  zocor 40 mg daily. No side effects.  Cardiovascular risk factors: advanced age (older than 51 for men, 67 for women), diabetes mellitus, dyslipidemia, hypertension, obesity (BMI >= 30 kg/m2), and sedentary lifestyle  Lab Results  Component Value Date   CHOL 148 03/20/2022   HDL 66 03/20/2022   LDLCALC 65 03/20/2022   TRIG 94 03/20/2022   CHOLHDL 2.2 09/02/2021   CHOLHDL 2.5 03/11/2021   CHOLHDL 2.4 09/10/2020   Lab Results  Component Value Date   ALT 14 03/20/2022   AST 16 03/20/2022   ALKPHOS 69 03/20/2022   BILITOT 0.3 03/20/2022   The ASCVD Risk score (Arnett DK, et al., 2019) failed to calculate for the following reasons:   The 2019 ASCVD risk score is only valid for ages 67 to 22  Plan: Continue statin. Continue working on nutrition plan to promote weight loss and improve lipid profile/decrease CV risk.  Recheck fasting lipid panel today.   Vitamin D Deficiency Vitamin D is at goal of 50.  Most recent vitamin D level was 57.2. She is on  prescription ergocalciferol 50,000 IU weekly. Lab Results  Component Value Date   VD25OH 57.2 03/20/2022   VD25OH 72.1 09/02/2021   VD25OH 75.1 03/11/2021    Plan: Continue  prescription ergocalciferol 50,000 IU weekly  Hypertension Hypertension asymptomatic, no significant medication side effects noted, borderline controlled, needs further observation, and needs improvement.  Medication(s): bisoprolol 5 mg daily   Chlorthalidone 50 mg daily   Valsartan 80 mg twice daily Renal function is normal.   BP Readings from Last 3 Encounters:  01/26/23 (!) 164/70  12/01/22 (!) 148/85  11/11/22 (!) 197/78   Lab Results  Component Value Date   CREATININE 0.86 03/20/2022   CREATININE 1.00 09/02/2021   CREATININE 0.76 03/11/2021   No results found for: "GFR"  Plan: Continue all antihypertensives at current dosages. Continue to work on nutrition plan to promote weight loss and improve BP control.  Recheck labs today  OSA using  CPAP:  Using CPAP appropriately for OSA. No complications.  Plan: Continue CPAP and periodic follow up with provider.  Intensive lifestyle modifications are the first line treatment for this issue. We discussed several lifestyle modifications today and she will continue to work on diet, exercise and weight loss efforts. We will continue to monitor. Orders and follow up as documented in patient record.   Chronic  fatigue:  Reports frequent fatigue.  Plan: Recheck labs today, B12, CBC, Vitamin D.  Continue nutrition plan and exercise as able to promote weight loss and improve strength and endurance. Discuss possible PT course with PCP.   ATTESTASTION STATEMENTS:  Reviewed by clinician on day of visit: allergies, medications, problem list, medical history, surgical history, family history, social history, and previous encounter notes.   I have personally spent 44 minutes total time today in preparation, patient care, nutritional counseling and documentation for this visit, including the following: review of clinical lab tests; review of medical tests/procedures/services.       , PA-C

## 2023-01-26 ENCOUNTER — Encounter (INDEPENDENT_AMBULATORY_CARE_PROVIDER_SITE_OTHER): Payer: Self-pay | Admitting: Physician Assistant

## 2023-01-26 ENCOUNTER — Ambulatory Visit (INDEPENDENT_AMBULATORY_CARE_PROVIDER_SITE_OTHER): Payer: Medicare Other | Admitting: Physician Assistant

## 2023-01-26 VITALS — BP 164/70 | HR 57 | Temp 97.8°F | Ht 64.0 in | Wt 238.0 lb

## 2023-01-26 DIAGNOSIS — E1169 Type 2 diabetes mellitus with other specified complication: Secondary | ICD-10-CM | POA: Diagnosis not present

## 2023-01-26 DIAGNOSIS — I10 Essential (primary) hypertension: Secondary | ICD-10-CM

## 2023-01-26 DIAGNOSIS — E785 Hyperlipidemia, unspecified: Secondary | ICD-10-CM

## 2023-01-26 DIAGNOSIS — E1159 Type 2 diabetes mellitus with other circulatory complications: Secondary | ICD-10-CM

## 2023-01-26 DIAGNOSIS — G4733 Obstructive sleep apnea (adult) (pediatric): Secondary | ICD-10-CM

## 2023-01-26 DIAGNOSIS — E559 Vitamin D deficiency, unspecified: Secondary | ICD-10-CM | POA: Diagnosis not present

## 2023-01-26 DIAGNOSIS — Z6841 Body Mass Index (BMI) 40.0 and over, adult: Secondary | ICD-10-CM

## 2023-01-26 DIAGNOSIS — Z7984 Long term (current) use of oral hypoglycemic drugs: Secondary | ICD-10-CM | POA: Diagnosis not present

## 2023-01-26 DIAGNOSIS — R5382 Chronic fatigue, unspecified: Secondary | ICD-10-CM | POA: Diagnosis not present

## 2023-01-30 DIAGNOSIS — G4733 Obstructive sleep apnea (adult) (pediatric): Secondary | ICD-10-CM | POA: Diagnosis not present

## 2023-01-30 DIAGNOSIS — E782 Mixed hyperlipidemia: Secondary | ICD-10-CM | POA: Diagnosis not present

## 2023-01-30 DIAGNOSIS — Z0000 Encounter for general adult medical examination without abnormal findings: Secondary | ICD-10-CM | POA: Diagnosis not present

## 2023-01-30 DIAGNOSIS — R3915 Urgency of urination: Secondary | ICD-10-CM | POA: Diagnosis not present

## 2023-01-30 DIAGNOSIS — K219 Gastro-esophageal reflux disease without esophagitis: Secondary | ICD-10-CM | POA: Diagnosis not present

## 2023-02-09 ENCOUNTER — Ambulatory Visit
Admission: RE | Admit: 2023-02-09 | Discharge: 2023-02-09 | Disposition: A | Discharge status: Home / Self Care | Payer: Medicare Other | Source: Ambulatory Visit | Attending: Family Medicine | Admitting: Family Medicine

## 2023-02-09 DIAGNOSIS — Z1231 Encounter for screening mammogram for malignant neoplasm of breast: Secondary | ICD-10-CM | POA: Diagnosis not present

## 2023-02-10 ENCOUNTER — Other Ambulatory Visit (INDEPENDENT_AMBULATORY_CARE_PROVIDER_SITE_OTHER): Payer: Self-pay | Admitting: Physician Assistant

## 2023-02-10 DIAGNOSIS — E1159 Type 2 diabetes mellitus with other circulatory complications: Secondary | ICD-10-CM

## 2023-02-12 ENCOUNTER — Ambulatory Visit (INDEPENDENT_AMBULATORY_CARE_PROVIDER_SITE_OTHER): Payer: Medicare Other | Admitting: Physician Assistant

## 2023-02-12 ENCOUNTER — Encounter (INDEPENDENT_AMBULATORY_CARE_PROVIDER_SITE_OTHER): Payer: Self-pay | Admitting: Physician Assistant

## 2023-02-12 VITALS — BP 134/76 | HR 60 | Temp 98.1°F | Ht 64.0 in | Wt 237.0 lb

## 2023-02-12 DIAGNOSIS — Z7984 Long term (current) use of oral hypoglycemic drugs: Secondary | ICD-10-CM | POA: Diagnosis not present

## 2023-02-12 DIAGNOSIS — Z6841 Body Mass Index (BMI) 40.0 and over, adult: Secondary | ICD-10-CM

## 2023-02-12 DIAGNOSIS — I10 Essential (primary) hypertension: Secondary | ICD-10-CM | POA: Diagnosis not present

## 2023-02-12 DIAGNOSIS — E785 Hyperlipidemia, unspecified: Secondary | ICD-10-CM

## 2023-02-12 DIAGNOSIS — E1169 Type 2 diabetes mellitus with other specified complication: Secondary | ICD-10-CM

## 2023-02-12 DIAGNOSIS — E559 Vitamin D deficiency, unspecified: Secondary | ICD-10-CM

## 2023-02-12 DIAGNOSIS — E669 Obesity, unspecified: Secondary | ICD-10-CM

## 2023-02-12 NOTE — Progress Notes (Signed)
.smr  Office: [phone]  /  Fax: [phone]  WEIGHT SUMMARY AND BIOMETRICS  Vitals Temp: 98.1 F (36.7 C) BP: 134/76 Pulse Rate: 60 SpO2: 98 %   Anthropometric Measurements Height: 5\' 4"  (1.626 m) Weight: 237 lb (107.5 kg) BMI (Calculated): 40.66 Weight at Last Visit: 238 lb Weight Lost Since Last Visit: 1 lb Weight Gained Since Last Visit: 0 Starting Weight: 259 lb Total Weight Loss (lbs): 22 lb (9.979 kg) Peak Weight: 283 lb   Body Composition  Body Fat %: 50.9 % Fat Mass (lbs): 120.8 lbs Muscle Mass (lbs): 110.8 lbs Total Body Water (lbs): 83.4 lbs Visceral Fat Rating : 19   Other Clinical Data Fasting: yes Labs: no Today's Visit #: 75 Starting Date: 04/12/18     HPI  Chief Complaint: OBESITY  Kathleen [name] is here to discuss her progress with her obesity treatment plan. She is on the keeping a food journal and adhering to recommended goals of 1100-1200 calories and 85 grams of protein and states she is following her eating plan approximately 75 % of the time. She states she is exercising stationary bike 5-10 minutes 2-3 times per week.   Interval History:  Since last office visit she down 1 pound Hunger/appetite-moderate control Cravings-denies excessive cravings Stress-increased stress with husband's recent brief hospitalization/illness.  He is doing better and they are planning on going on a trip by bus and then to the beach. 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  She did see her PCP Dr. Tiburcio Pea at [place] on 01/30/2023  and has stopped metformin as recommended for 3 to 4 days.  She did note some nausea during this.  And it is difficult to ascertain if this is a new  gastroenteritis versus some ongoing GI upset with metformin and we will monitor.  TREATMENT PLAN FOR OBESITY:  Recommended Dietary Goals  Kathleen [name] is currently in the action stage of change. As such, her goal is to continue weight management plan. She has agreed to keeping a food journal and adhering to recommended goals of 1100-1200 calories and 85 grams of protein.  Behavioral Intervention  We discussed the following Behavioral Modification Strategies today: increasing lean protein intake, decreasing simple carbohydrates , increasing vegetables, increasing lower glycemic fruits, avoiding skipping meals, increasing water intake, work on tracking and journaling calories using tracking application, emotional eating strategies and understanding the difference between hunger signals and cravings, work on managing stress, creating time for self-care and relaxation measures, continue to practice mindfulness when eating, and planning for success.  Additional resources provided today: NA  Recommended Physical Activity Goals  Kathleen [name] has been advised to work up to 150 minutes of moderate intensity aerobic activity a week and strengthening exercises 2-3 times per week for cardiovascular health, weight loss maintenance and preservation of muscle mass.   She has agreed to Continue current level of physical activity  and Think about ways to increase daily physical activity and overcoming barriers to exercise   Pharmacotherapy We discussed various medication options to help Kathleen [name] with her weight loss efforts and we both agreed to remain off of metformin for at least the next 3 to 4 days and monitor GI status and if improving she will plan to resume metformin  500 mg once daily initially and then increase to 500 twice daily if she is tolerating it..    Return in about 4 weeks (around 03/12/2023).Marland Kitchen She was informed of the importance of frequent follow up visits to maximize her success with intensive  lifestyle modifications for her multiple health conditions.  PHYSICAL EXAM:  Blood pressure 134/76, pulse 60, temperature 98.1 F (36.7 C), height 5\' 4"  (1.626 m), weight 237 lb (107.5 kg), SpO2 98%. Body mass index is 40.68 kg/m.  General: She is overweight, cooperative, alert, well developed, and in no acute distress. PSYCH: Has normal mood, affect and thought process.   Cardiovascular: HR-60, BP 134/76 Lungs: Normal breathing effort, no conversational dyspnea.  DIAGNOSTIC DATA REVIEWED:  BMET    Component Value Date/Time   NA 138 01/26/2023 1139   NA 141 12/14/2014 0747   K 4.5 01/26/2023 1139   K 4.2 12/14/2014 0747   CL 100 01/26/2023 1139   CO2 25 01/26/2023 1139   CO2 28 12/14/2014 0747   GLUCOSE 104 (H) 01/26/2023 1139   GLUCOSE 120 12/14/2014 0747   BUN 30 (H) 01/26/2023 1139   BUN 21.3 12/14/2014 0747   CREATININE 0.94 01/26/2023 1139   CREATININE 0.9 12/14/2014 0747   CALCIUM 9.9 01/26/2023 1139   CALCIUM 9.4 12/14/2014 0747   GFRNONAA 73 06/05/2020 1113   GFRAA 84 06/05/2020 1113   Lab Results  Component Value Date   HGBA1C 5.9 (H) 01/26/2023   HGBA1C 6.8 (H) 04/12/2018   Lab Results  Component Value Date   INSULIN 17.0 01/26/2023   INSULIN 30.6 (H) 04/12/2018   Lab Results  Component Value Date   TSH 1.620 01/26/2023   CBC    Component Value Date/Time   WBC 6.7 01/26/2023 1139   WBC 8.0 12/14/2014 0746   WBC 6.1 03/04/2011 1040   RBC 3.83 01/26/2023 1139   RBC 4.17 12/14/2014 0746   RBC 3.99 03/04/2011 1040   HGB 11.5 01/26/2023 1139   HGB 12.4 12/14/2014 0746   HCT 36.5 01/26/2023 1139   HCT 37.0 12/14/2014 0746   PLT 223 01/26/2023 1139   MCV 95 01/26/2023 1139   MCV 88.9 12/14/2014 0746   MCH 30.0 01/26/2023 1139   MCH 29.8 12/14/2014 0746   MCH 30.1 03/04/2011 1040   MCHC 31.5 01/26/2023 1139   MCHC 33.5 12/14/2014 0746   MCHC 33.1 03/04/2011 1040   RDW 14.3 01/26/2023 1139   RDW 12.5 12/14/2014 0746   Iron Studies No  results found for: "IRON", "TIBC", "FERRITIN", "IRONPCTSAT" Lipid Panel     Component Value Date/Time   CHOL 146 01/26/2023 1139   TRIG 97 01/26/2023 1139   HDL 61 01/26/2023 1139   CHOLHDL 2.2 09/02/2021 1139   LDLCALC 67 01/26/2023 1139   Hepatic Function Panel     Component Value Date/Time   PROT 7.0 01/26/2023 1139   PROT 7.1 12/14/2014 0747   ALBUMIN 4.5 01/26/2023 1139   ALBUMIN 3.8 12/14/2014 0747   AST 19 01/26/2023 1139   AST 16 12/14/2014 0747   ALT 16 01/26/2023 1139   ALT 15 12/14/2014 0747   ALKPHOS 53 01/26/2023 1139   ALKPHOS 52 12/14/2014 0747   BILITOT 0.3 01/26/2023 1139   BILITOT 0.40 12/14/2014 0747      Component Value Date/Time   TSH 1.620 01/26/2023 1139   Nutritional Lab Results  Component Value Date   VD25OH 65.9 01/26/2023   VD25OH 57.2 03/20/2022   VD25OH 72.1 09/02/2021    ASSOCIATED  CONDITIONS ADDRESSED TODAY  ASSESSMENT AND PLAN  Problem List Items Addressed This Visit     Vitamin D deficiency   Diabetes mellitus (HCC)   Type 2 diabetes mellitus with hyperlipidemia (HCC)   Essential hypertension   Type 2 Diabetes Mellitus with other specified complication, without long-term current use of insulin HgbA1c is at goal. Last A1c was 5.9 CBGs: Fasting 120-130 Post prandial : 90-108 Episodes of hypoglycemia: no She is on a statin She is on an ARB Medication(s): Metformin 500 mg twice daily with meals Holding currently due to increased diarrhea lately. ? GI virus vs GI side effects of metformin.  She is also going to hold her magnesium for couple of days and see if this helps with her GI upset as well.  Lab Results  Component Value Date   HGBA1C 5.9 (H) 01/26/2023   HGBA1C 5.9 (H) 03/20/2022   HGBA1C 6.0 (H) 09/02/2021   Lab Results  Component Value Date   LDLCALC 67 01/26/2023   CREATININE 0.94 01/26/2023   No results found for: "GFR"  Plan: Discontinue Metformin 500 mg twice daily with meals for now and monitor off  medication and if GI symptoms improve, resume at 500 mg once daily initially.  She can increase back to metformin 500 mg twice daily if no significant GI side effects and we will monitor closely. We did discuss the possibility of considering starting a GLP-1 receptor agonist medication again such as Mounjaro.  She previously been on Ozempic but had to discontinue due to the expense of the medication.  Hypertension Hypertension asymptomatic, reasonably well controlled, and no significant medication side effects noted.  Medication(s): Valsartan 80 mg twice daily   Chlorthalidone 50 mg once daily    Bisoprolol 5 mg once daily Renal function is in normal range Blood pressure at home is generally 120s over 50-70  BP Readings from Last 3 Encounters:  02/12/23 134/76  01/26/23 (!) 164/70  12/01/22 (!) 148/85   Lab Results  Component Value Date   CREATININE 0.94 01/26/2023   CREATININE 0.86 03/20/2022   CREATININE 1.00 09/02/2021   No results found for: "GFR"  Plan: Continue all antihypertensives at current dosages. Continue to work on nutrition plan to promote weight loss and improve BP control.   Vitamin D Deficiency Vitamin D is at goal of 50.  Most recent vitamin D level was 65.9. She is on  prescription ergocalciferol 50,000 IU weekly. Lab Results  Component Value Date   VD25OH 65.9 01/26/2023   VD25OH 57.2 03/20/2022   VD25OH 72.1 09/02/2021    Plan: Continue  prescription ergocalciferol 50,000 IU weekly Low vitamin D levels can be associated with adiposity and may result in leptin resistance and weight gain. Also associated with fatigue. Currently on vitamin D supplementation without any adverse effects.  Recheck level 3-4 times yearly to optimize supplementation/avoid over supplementation.  ATTESTASTION STATEMENTS:  Reviewed by clinician on day of visit: allergies, medications, problem list, medical history, surgical history, family history, social history, and previous  encounter notes.   I have personally spent 40 minutes total time today in preparation, patient care, nutritional counseling and documentation for this visit, including the following: review of clinical lab tests; review of medical tests/procedures/services.      Yajaira Doffing, PA-C

## 2023-02-13 ENCOUNTER — Other Ambulatory Visit (INDEPENDENT_AMBULATORY_CARE_PROVIDER_SITE_OTHER): Payer: Self-pay | Admitting: Physician Assistant

## 2023-02-13 DIAGNOSIS — E1159 Type 2 diabetes mellitus with other circulatory complications: Secondary | ICD-10-CM

## 2023-02-16 ENCOUNTER — Other Ambulatory Visit (INDEPENDENT_AMBULATORY_CARE_PROVIDER_SITE_OTHER): Payer: Self-pay | Admitting: Physician Assistant

## 2023-02-16 DIAGNOSIS — E559 Vitamin D deficiency, unspecified: Secondary | ICD-10-CM

## 2023-03-02 ENCOUNTER — Encounter (INDEPENDENT_AMBULATORY_CARE_PROVIDER_SITE_OTHER): Payer: Self-pay | Admitting: Physician Assistant

## 2023-03-11 ENCOUNTER — Ambulatory Visit (INDEPENDENT_AMBULATORY_CARE_PROVIDER_SITE_OTHER): Payer: Medicare Other | Admitting: Physician Assistant

## 2023-03-11 ENCOUNTER — Encounter (INDEPENDENT_AMBULATORY_CARE_PROVIDER_SITE_OTHER): Payer: Self-pay | Admitting: Physician Assistant

## 2023-03-11 DIAGNOSIS — Z7984 Long term (current) use of oral hypoglycemic drugs: Secondary | ICD-10-CM

## 2023-03-11 DIAGNOSIS — E559 Vitamin D deficiency, unspecified: Secondary | ICD-10-CM

## 2023-03-11 DIAGNOSIS — E1169 Type 2 diabetes mellitus with other specified complication: Secondary | ICD-10-CM

## 2023-03-11 DIAGNOSIS — E785 Hyperlipidemia, unspecified: Secondary | ICD-10-CM | POA: Diagnosis not present

## 2023-03-11 DIAGNOSIS — E1159 Type 2 diabetes mellitus with other circulatory complications: Secondary | ICD-10-CM

## 2023-03-11 DIAGNOSIS — Z7985 Long-term (current) use of injectable non-insulin antidiabetic drugs: Secondary | ICD-10-CM | POA: Diagnosis not present

## 2023-03-11 DIAGNOSIS — E669 Obesity, unspecified: Secondary | ICD-10-CM

## 2023-03-11 DIAGNOSIS — I10 Essential (primary) hypertension: Secondary | ICD-10-CM

## 2023-03-11 DIAGNOSIS — Z6841 Body Mass Index (BMI) 40.0 and over, adult: Secondary | ICD-10-CM

## 2023-03-11 MED ORDER — ONETOUCH ULTRA VI STRP
ORAL_STRIP | 3 refills | Status: DC
Start: 2023-03-11 — End: 2023-04-09

## 2023-03-11 MED ORDER — SIMVASTATIN 40 MG PO TABS
40.0000 mg | ORAL_TABLET | Freq: Every day | ORAL | 0 refills | Status: DC
Start: 2023-03-11 — End: 2023-04-09

## 2023-03-11 MED ORDER — VALSARTAN 80 MG PO TABS
80.0000 mg | ORAL_TABLET | Freq: Two times a day (BID) | ORAL | 0 refills | Status: DC
Start: 2023-03-11 — End: 2023-04-09

## 2023-03-11 MED ORDER — VITAMIN D (ERGOCALCIFEROL) 1.25 MG (50000 UNIT) PO CAPS: 50000.0000 [IU] | ORAL_CAPSULE | ORAL | 0 refills | Status: DC

## 2023-03-11 MED ORDER — CHLORTHALIDONE 50 MG PO TABS: 50.0000 mg | ORAL_TABLET | Freq: Every day | ORAL | 0 refills | Status: DC

## 2023-03-11 MED ORDER — BISOPROLOL FUMARATE 5 MG PO TABS: 5.0000 mg | ORAL_TABLET | Freq: Every day | ORAL | 0 refills | Status: DC

## 2023-03-11 MED ORDER — METFORMIN HCL 500 MG PO TABS: 500.0000 mg | ORAL_TABLET | Freq: Two times a day (BID) | ORAL | 0 refills | Status: DC

## 2023-03-11 MED ORDER — TIRZEPATIDE 2.5 MG/0.5ML ~~LOC~~ SOAJ
2.5000 mg | SUBCUTANEOUS | 0 refills | Status: DC
Start: 2023-03-11 — End: 2023-04-09

## 2023-03-11 MED ORDER — ONETOUCH ULTRASOFT LANCETS MISC: 0 refills | Status: DC

## 2023-03-11 NOTE — Progress Notes (Signed)
.smr  Office: [phone]  /  Fax: [phone]  WEIGHT SUMMARY AND BIOMETRICS  Vitals Temp: 98.1 F (36.7 C) BP: (!) 148/77 Pulse Rate: (!) 56 SpO2: 97 %   Anthropometric Measurements Height: 5\' 4"  (1.626 m) Weight: 237 lb (107.5 kg) BMI (Calculated): 40.66 Weight at Last Visit: 237 lb Weight Lost Since Last Visit: 0 Weight Gained Since Last Visit: 0 Starting Weight: 259 lb Total Weight Loss (lbs): 22 lb (9.979 kg) Peak Weight: 283 lb   Body Composition  Body Fat %: 50.8 % Fat Mass (lbs): 120.6 lbs Muscle Mass (lbs): 110.8 lbs Total Body Water (lbs): 84.2 lbs Visceral Fat Rating : 19   Other Clinical Data Fasting: no Labs: no Today's Visit #: 47 Starting Date: 04/12/18     HPI  Chief Complaint: OBESITY  Kathleen [name] is here to discuss her progress with her obesity treatment plan. She is on the keeping a food journal and adhering to recommended goals of 1100-1200 calories and 85 grams protein and states she is following her eating plan approximately 85 % of the time. She states she is exercising bike 5-10 minutes 2-3 times per week.   Interval History:  Since last office visit she has maintained her weight.   The patient, an 82 year old female with a history of type 2 diabetes, hyperlipidemia, hypertension, and obesity, presents for a follow-up of her obesity treatment plan. She reports maintaining her weight but struggles with high fasting blood sugars, ranging from 123 to 145 mg/dL. She has been adhering to her medication regimen, which includes metformin and valsartan, among others. She has been keeping a food journal and is mindful of her caloric intake, but admits to occasional indulgences, such as a recent meal of a cheeseburger and tater tots that totaled over 1000 calories. She is considering starting a new medication, Mounjaro, to help with weight loss and blood sugar control. She is also actively trying to make healthier food choices, such as reducing her  intake of sugars and artificial sweeteners.  Pharmacotherapy: metformin 500 mg twice daily. GI upset occasionally  TREATMENT PLAN FOR OBESITY:  Recommended Dietary Goals  Kathleen [name] is currently in the action stage of change. As such, her goal is to continue weight management plan. She has agreed to keeping a food journal and adhering to recommended goals of 1100-1200 calories and 85 grams of protein.  Behavioral Intervention  We discussed the following Behavioral Modification Strategies today: increasing lean protein intake, decreasing simple carbohydrates , increasing vegetables, increasing lower glycemic fruits, increasing fiber rich foods, avoiding skipping meals, increasing water intake, work on tracking and journaling calories using tracking application, emotional eating strategies and understanding the difference between hunger signals and cravings, avoiding temptations and identifying enticing environmental cues, continue to practice mindfulness when eating, and planning for success.  Additional resources provided today: NA  Recommended Physical Activity Goals  Kathleen [name] has been advised to work up to 150 minutes of moderate intensity aerobic activity a week and strengthening exercises 2-3 times per week for cardiovascular health, weight loss maintenance and preservation of muscle mass.   She has agreed to Continue current level of physical activity  and Think about ways to increase daily physical activity and overcoming barriers to exercise   Pharmacotherapy We discussed various medication options to help Kathleen [name] with her weight loss efforts and we both agreed to start Mounjaro 2.5 mg weekly and continue metformin for Type 2 diabetes.    Return in about 4 weeks (around 04/08/2023).Marland Kitchen She was informed of the  importance of frequent follow up visits to maximize her success with intensive lifestyle modifications for her multiple health conditions.  PHYSICAL EXAM:  Blood pressure (!)  148/77, pulse (!) 56, temperature 98.1 F (36.7 C), height 5\' 4"  (1.626 m), weight 237 lb (107.5 kg), SpO2 97%. Body mass index is 40.68 kg/m.  General: She is overweight, cooperative, alert, well developed, and in no acute distress. PSYCH: Has normal mood, affect and thought process.   Cardiovascular: HR 50's BP 148/77 Lungs: Normal breathing effort, no conversational dyspnea.  DIAGNOSTIC DATA REVIEWED:  BMET    Component Value Date/Time   NA 138 01/26/2023 1139   NA 141 12/14/2014 0747   K 4.5 01/26/2023 1139   K 4.2 12/14/2014 0747   CL 100 01/26/2023 1139   CO2 25 01/26/2023 1139   CO2 28 12/14/2014 0747   GLUCOSE 104 (H) 01/26/2023 1139   GLUCOSE 120 12/14/2014 0747   BUN 30 (H) 01/26/2023 1139   BUN 21.3 12/14/2014 0747   CREATININE 0.94 01/26/2023 1139   CREATININE 0.9 12/14/2014 0747   CALCIUM 9.9 01/26/2023 1139   CALCIUM 9.4 12/14/2014 0747   GFRNONAA 73 06/05/2020 1113   GFRAA 84 06/05/2020 1113   Lab Results  Component Value Date   HGBA1C 5.9 (H) 01/26/2023   HGBA1C 6.8 (H) 04/12/2018   Lab Results  Component Value Date   INSULIN 17.0 01/26/2023   INSULIN 30.6 (H) 04/12/2018   Lab Results  Component Value Date   TSH 1.620 01/26/2023   CBC    Component Value Date/Time   WBC 6.7 01/26/2023 1139   WBC 8.0 12/14/2014 0746   WBC 6.1 03/04/2011 1040   RBC 3.83 01/26/2023 1139   RBC 4.17 12/14/2014 0746   RBC 3.99 03/04/2011 1040   HGB 11.5 01/26/2023 1139   HGB 12.4 12/14/2014 0746   HCT 36.5 01/26/2023 1139   HCT 37.0 12/14/2014 0746   PLT 223 01/26/2023 1139   MCV 95 01/26/2023 1139   MCV 88.9 12/14/2014 0746   MCH 30.0 01/26/2023 1139   MCH 29.8 12/14/2014 0746   MCH 30.1 03/04/2011 1040   MCHC 31.5 01/26/2023 1139   MCHC 33.5 12/14/2014 0746   MCHC 33.1 03/04/2011 1040   RDW 14.3 01/26/2023 1139   RDW 12.5 12/14/2014 0746   Iron Studies No results found for: "IRON", "TIBC", "FERRITIN", "IRONPCTSAT" Lipid Panel     Component Value  Date/Time   CHOL 146 01/26/2023 1139   TRIG 97 01/26/2023 1139   HDL 61 01/26/2023 1139   CHOLHDL 2.2 09/02/2021 1139   LDLCALC 67 01/26/2023 1139   Hepatic Function Panel     Component Value Date/Time   PROT 7.0 01/26/2023 1139   PROT 7.1 12/14/2014 0747   ALBUMIN 4.5 01/26/2023 1139   ALBUMIN 3.8 12/14/2014 0747   AST 19 01/26/2023 1139   AST 16 12/14/2014 0747   ALT 16 01/26/2023 1139   ALT 15 12/14/2014 0747   ALKPHOS 53 01/26/2023 1139   ALKPHOS 52 12/14/2014 0747   BILITOT 0.3 01/26/2023 1139   BILITOT 0.40 12/14/2014 0747      Component Value Date/Time   TSH 1.620 01/26/2023 1139   Nutritional Lab Results  Component Value Date   VD25OH 65.9 01/26/2023   VD25OH 57.2 03/20/2022   VD25OH 72.1 09/02/2021    ASSOCIATED CONDITIONS ADDRESSED TODAY  ASSESSMENT AND PLAN  Problem List Items Addressed This Visit     Vitamin D deficiency   Relevant Medications   Vitamin D,  Ergocalciferol, (DRISDOL) 1.25 MG (50000 UNIT) CAPS capsule   BMI 40.0-44.9, adult (HCC)- Current BMI 40.7   Relevant Medications   tirzepatide (MOUNJARO) 2.5 MG/0.5ML Pen   metFORMIN (GLUCOPHAGE) 500 MG tablet   Obesity (HCC)- starting BMI 44.46 - Primary   Relevant Medications   tirzepatide (MOUNJARO) 2.5 MG/0.5ML Pen   metFORMIN (GLUCOPHAGE) 500 MG tablet   Diabetes mellitus (HCC)   Relevant Medications   Lancets (ONETOUCH ULTRASOFT) lancets   glucose blood (ONETOUCH ULTRA) test strip   simvastatin (ZOCOR) 40 MG tablet   tirzepatide (MOUNJARO) 2.5 MG/0.5ML Pen   metFORMIN (GLUCOPHAGE) 500 MG tablet   valsartan (DIOVAN) 80 MG tablet   Type 2 diabetes mellitus with hyperlipidemia (HCC)   Relevant Medications   bisoprolol (ZEBETA) 5 MG tablet   chlorthalidone (HYGROTON) 50 MG tablet   simvastatin (ZOCOR) 40 MG tablet   tirzepatide (MOUNJARO) 2.5 MG/0.5ML Pen   metFORMIN (GLUCOPHAGE) 500 MG tablet   valsartan (DIOVAN) 80 MG tablet   Essential hypertension   Relevant Medications    bisoprolol (ZEBETA) 5 MG tablet   chlorthalidone (HYGROTON) 50 MG tablet   simvastatin (ZOCOR) 40 MG tablet   valsartan (DIOVAN) 80 MG tablet  Type 2 Diabetes Mellitus Fasting blood sugars ranging from 123-145 mg/dL. Currently on Metformin. Discussed the potential benefit of adjusting Metformin timing and adding Mounjaro to improve glycemic control and potentially promote weight loss. -Continue Metformin, adjust timing to dinner. -Add Mounjaro, starting with the loading dose of 2.5 for four weeks, then increase to 5 if well-tolerated. -Check blood sugars regularly and monitor for signs of hypoglycemia.  Fasting blood glucose: 132 mg/dL (16/03/9603) Fasting blood glucose: 145 mg/dL Fasting blood glucose: 144 mg/dL Fasting blood glucose: 128 mg/dL Fasting blood glucose: 123 mg/dL Post prandial Blood glucose: 85-114  Patient is actively trying to manage her weight through dietary changes and is keeping a food journal. Discussed the potential benefit of Mounjaro in promoting weight loss and suppressing appetite. -Start Mounjaro 2.5 mg weekly and continue metformin 500 mg BID.  -Continue dietary modifications and food journaling.  Hyperlipidemia Managed with Simvastatin. -Continue/refill Simvastatin.  Hypertension Managed with Bisoprolol and Valsartan and chlorthalidone. -Continue/refill Bisoprolol and Valsartan and chlorthalidone.  Vitamin D Deficiency Managed with Vitamin D supplementation. -Continue/refill Ergocalciferol 50,000 units weekly  General Health Maintenance -Get flu shot in early October. -Follow-up appointment on 04/09/2023.  ATTESTASTION STATEMENTS:  Reviewed by clinician on day of visit: allergies, medications, problem list, medical history, surgical history, family history, social history, and previous encounter notes.   I have personally spent 35 minutes total time today in preparation, patient care, nutritional counseling and documentation for this visit,  including the following: review of clinical lab tests; review of medical tests/procedures/services.      Orvile Corona, PA-C

## 2023-03-18 ENCOUNTER — Ambulatory Visit (INDEPENDENT_AMBULATORY_CARE_PROVIDER_SITE_OTHER): Payer: Medicare Other | Admitting: Physician Assistant

## 2023-03-19 ENCOUNTER — Other Ambulatory Visit (INDEPENDENT_AMBULATORY_CARE_PROVIDER_SITE_OTHER): Payer: Self-pay | Admitting: Physician Assistant

## 2023-03-19 DIAGNOSIS — E1159 Type 2 diabetes mellitus with other circulatory complications: Secondary | ICD-10-CM

## 2023-03-23 ENCOUNTER — Telehealth (INDEPENDENT_AMBULATORY_CARE_PROVIDER_SITE_OTHER): Payer: Self-pay | Admitting: Physician Assistant

## 2023-03-23 ENCOUNTER — Other Ambulatory Visit (INDEPENDENT_AMBULATORY_CARE_PROVIDER_SITE_OTHER): Payer: Self-pay | Admitting: Physician Assistant

## 2023-03-23 ENCOUNTER — Ambulatory Visit (INDEPENDENT_AMBULATORY_CARE_PROVIDER_SITE_OTHER): Payer: Medicare Other | Admitting: Physician Assistant

## 2023-03-23 DIAGNOSIS — I10 Essential (primary) hypertension: Secondary | ICD-10-CM

## 2023-03-23 NOTE — Telephone Encounter (Signed)
Patient called in to notify us that she has changed pharmacies. She would like 90 day supply refills for her Metformin and Vitamin D sent to [facility] on [address].  Thank you

## 2023-04-09 ENCOUNTER — Encounter (INDEPENDENT_AMBULATORY_CARE_PROVIDER_SITE_OTHER): Payer: Self-pay | Admitting: Physician Assistant

## 2023-04-09 ENCOUNTER — Ambulatory Visit (INDEPENDENT_AMBULATORY_CARE_PROVIDER_SITE_OTHER): Payer: Medicare Other | Admitting: Physician Assistant

## 2023-04-09 VITALS — BP 142/81 | HR 58 | Temp 97.8°F | Ht 64.0 in | Wt 234.0 lb

## 2023-04-09 DIAGNOSIS — E785 Hyperlipidemia, unspecified: Secondary | ICD-10-CM | POA: Diagnosis not present

## 2023-04-09 DIAGNOSIS — E1169 Type 2 diabetes mellitus with other specified complication: Secondary | ICD-10-CM | POA: Diagnosis not present

## 2023-04-09 DIAGNOSIS — Z7985 Long-term (current) use of injectable non-insulin antidiabetic drugs: Secondary | ICD-10-CM | POA: Diagnosis not present

## 2023-04-09 DIAGNOSIS — Z6841 Body Mass Index (BMI) 40.0 and over, adult: Secondary | ICD-10-CM

## 2023-04-09 DIAGNOSIS — I152 Hypertension secondary to endocrine disorders: Secondary | ICD-10-CM | POA: Diagnosis not present

## 2023-04-09 DIAGNOSIS — E1159 Type 2 diabetes mellitus with other circulatory complications: Secondary | ICD-10-CM

## 2023-04-09 DIAGNOSIS — E559 Vitamin D deficiency, unspecified: Secondary | ICD-10-CM | POA: Diagnosis not present

## 2023-04-09 DIAGNOSIS — Z7984 Long term (current) use of oral hypoglycemic drugs: Secondary | ICD-10-CM | POA: Diagnosis not present

## 2023-04-09 DIAGNOSIS — E669 Obesity, unspecified: Secondary | ICD-10-CM

## 2023-04-09 DIAGNOSIS — M25569 Pain in unspecified knee: Secondary | ICD-10-CM | POA: Diagnosis not present

## 2023-04-09 MED ORDER — ONETOUCH ULTRA 2 W/DEVICE KIT
PACK | 0 refills | Status: DC
Start: 2023-04-09 — End: 2024-03-16

## 2023-04-09 MED ORDER — ONETOUCH ULTRASOFT LANCETS MISC
0 refills | Status: DC
Start: 2023-04-09 — End: 2024-04-24

## 2023-04-09 MED ORDER — SIMVASTATIN 40 MG PO TABS: 40.0000 mg | ORAL_TABLET | Freq: Every day | ORAL | 0 refills | Status: DC

## 2023-04-09 MED ORDER — ONETOUCH ULTRA VI STRP
ORAL_STRIP | 3 refills | Status: DC
Start: 2023-04-09 — End: 2024-03-16

## 2023-04-09 MED ORDER — VITAMIN D (ERGOCALCIFEROL) 1.25 MG (50000 UNIT) PO CAPS
50000.0000 [IU] | ORAL_CAPSULE | ORAL | 0 refills | Status: DC
Start: 2023-04-09 — End: 2023-08-19

## 2023-04-09 MED ORDER — TIRZEPATIDE 2.5 MG/0.5ML ~~LOC~~ SOAJ
2.5000 mg | SUBCUTANEOUS | 0 refills | Status: DC
Start: 2023-04-09 — End: 2023-05-07

## 2023-04-09 MED ORDER — METFORMIN HCL 500 MG PO TABS: 500.0000 mg | ORAL_TABLET | Freq: Every day | ORAL | 0 refills | Status: DC

## 2023-04-09 MED ORDER — CHLORTHALIDONE 50 MG PO TABS: 50.0000 mg | ORAL_TABLET | Freq: Every day | ORAL | 0 refills | Status: DC

## 2023-04-09 MED ORDER — VALSARTAN 80 MG PO TABS: 80.0000 mg | ORAL_TABLET | Freq: Two times a day (BID) | ORAL | 0 refills | Status: DC

## 2023-04-09 MED ORDER — METFORMIN HCL 500 MG PO TABS: 500.0000 mg | ORAL_TABLET | Freq: Two times a day (BID) | ORAL | 0 refills | Status: DC

## 2023-04-09 MED ORDER — BISOPROLOL FUMARATE 5 MG PO TABS: 5.0000 mg | ORAL_TABLET | Freq: Every day | ORAL | 0 refills | Status: DC

## 2023-04-09 NOTE — Progress Notes (Signed)
.[facility]  Office: [phone]  /  Fax: [phone]  WEIGHT SUMMARY AND BIOMETRICS  No data recorded  No data recorded  No data recorded  No data recorded    HPI  Chief Complaint: OBESITY  Kathleen [name] is here to discuss her progress with her obesity treatment plan. She is on the keeping a food journal and adhering to recommended goals of 1100-1200 calories and 85 grams of protein and states she is following her eating plan approximately 60 % of the time. She states she is exercising 0 minutes 0 times per week.   Interval History:  Since last office visit she is down 3 lbs.   Pharmacotherapy: metformin 500 mg twice daily. GI upset occasionally    Started Mounjaro 2.5 mg weekly. Reports some increased thirst lately and   Episodes of diarrhea, but was also having some GI upset with metformin prior  To starting Mounjaro.   The patient, with a history of obesity, type 2 diabetes, hypertension, and vitamin D deficiency, presents for a follow-up visit to discuss her current treatment plan. She reports feeling thirsty and experiencing diarrhea since starting on Mounjaro, an obesity treatment medication. She also mentions [place] which resulted in a minor foot injury and subsequent knee pain. The patient notes that the knee pain has been bothersome, particularly when climbing stairs. She denies any swelling or other significant injuries from the fall. The patient also discusses her dietary habits and efforts to maintain a healthy lifestyle, including making healthier food choices and staying active. She expresses satisfaction with her current weight loss progress, noting a 3-pound weight loss in the past month.  TREATMENT PLAN FOR OBESITY: Obesity Continued weight loss with a 3-pound reduction over the past month. Discussed the importance of maintaining muscle mass and gradual weight loss. -Continue current diet and exercise regimen. -Encouraged to  focus on protein intake, even if not feeling hungry for a full meal. Recommended Dietary Goals  Kathleen [name] is currently in the action stage of change. As such, her goal is to continue weight management plan. She has agreed to keeping a food journal and adhering to recommended goals of 1100-1200 calories and 85 grams of protein.  Behavioral Intervention  We discussed the following Behavioral Modification Strategies today: continue to work on maintaining a reduced calorie state, getting the recommended amount of protein, incorporating whole foods, making healthy choices, staying well hydrated and practicing mindfulness when eating..  Additional resources provided today: NA  Recommended Physical Activity Goals  Kathleen [name] has been advised to work up to 150 minutes of moderate intensity aerobic activity a week and strengthening exercises 2-3 times per week for cardiovascular health, weight loss maintenance and preservation of muscle mass.   She has agreed to Think about enjoyable ways to increase daily physical activity and overcoming barriers to exercise and Increase physical activity in their day and reduce sedentary time (increase NEAT).   Pharmacotherapy We discussed various medication options to help Kathleen [name] with her weight loss efforts and we both agreed to [facility] 2.5 mg weekly for Type 2 diabetes.    Return in about 4 weeks (around 05/07/2023).Marland Kitchen She was informed of the importance of frequent follow up visits to maximize her success with intensive lifestyle modifications for her multiple health conditions.  PHYSICAL EXAM:  Blood pressure (!) 142/81, pulse (!) 58, temperature 97.8 F (36.6 C), height 5\' 4"  (1.626 m), weight 234 lb (106.1 kg), SpO2 100%. Body mass index is 40.17 kg/m.  General: She is overweight, cooperative, alert, well developed, and in no acute distress. PSYCH: Has normal mood, affect and thought process.   Cardiovascular: HR 50's BP 142/81 Lungs: Normal  breathing effort, no conversational dyspnea. Neuro: no focal deficit  DIAGNOSTIC DATA REVIEWED:  BMET    Component Value Date/Time   NA 138 01/26/2023 1139   NA 141 12/14/2014 0747   K 4.5 01/26/2023 1139   K 4.2 12/14/2014 0747   CL 100 01/26/2023 1139   CO2 25 01/26/2023 1139   CO2 28 12/14/2014 0747   GLUCOSE 104 (H) 01/26/2023 1139   GLUCOSE 120 12/14/2014 0747   BUN 30 (H) 01/26/2023 1139   BUN 21.3 12/14/2014 0747   CREATININE 0.94 01/26/2023 1139   CREATININE 0.9 12/14/2014 0747   CALCIUM 9.9 01/26/2023 1139   CALCIUM 9.4 12/14/2014 0747   GFRNONAA 73 06/05/2020 1113   GFRAA 84 06/05/2020 1113   Lab Results  Component Value Date   HGBA1C 5.9 (H) 01/26/2023   HGBA1C 6.8 (H) 04/12/2018   Lab Results  Component Value Date   INSULIN 17.0 01/26/2023   INSULIN 30.6 (H) 04/12/2018   Lab Results  Component Value Date   TSH 1.620 01/26/2023   CBC    Component Value Date/Time   WBC 6.7 01/26/2023 1139   WBC 8.0 12/14/2014 0746   WBC 6.1 03/04/2011 1040   RBC 3.83 01/26/2023 1139   RBC 4.17 12/14/2014 0746   RBC 3.99 03/04/2011 1040   HGB 11.5 01/26/2023 1139   HGB 12.4 12/14/2014 0746   HCT 36.5 01/26/2023 1139   HCT 37.0 12/14/2014 0746   PLT 223 01/26/2023 1139   MCV 95 01/26/2023 1139   MCV 88.9 12/14/2014 0746   MCH 30.0 01/26/2023 1139   MCH 29.8 12/14/2014 0746   MCH 30.1 03/04/2011 1040   MCHC 31.5 01/26/2023 1139   MCHC 33.5 12/14/2014 0746   MCHC 33.1 03/04/2011 1040   RDW 14.3 01/26/2023 1139   RDW 12.5 12/14/2014 0746   Iron Studies No results found for: "IRON", "TIBC", "FERRITIN", "IRONPCTSAT" Lipid Panel     Component Value Date/Time   CHOL 146 01/26/2023 1139   TRIG 97 01/26/2023 1139   HDL 61 01/26/2023 1139   CHOLHDL 2.2 09/02/2021 1139   LDLCALC 67 01/26/2023 1139   Hepatic Function Panel     Component Value Date/Time   PROT 7.0 01/26/2023 1139   PROT 7.1 12/14/2014 0747   ALBUMIN 4.5 01/26/2023 1139   ALBUMIN 3.8  12/14/2014 0747   AST 19 01/26/2023 1139   AST 16 12/14/2014 0747   ALT 16 01/26/2023 1139   ALT 15 12/14/2014 0747   ALKPHOS 53 01/26/2023 1139   ALKPHOS 52 12/14/2014 0747   BILITOT 0.3 01/26/2023 1139   BILITOT 0.40 12/14/2014 0747      Component Value Date/Time   TSH 1.620 01/26/2023 1139   Nutritional Lab Results  Component Value Date   VD25OH 65.9 01/26/2023   VD25OH 57.2 03/20/2022   VD25OH 72.1 09/02/2021    ASSOCIATED CONDITIONS ADDRESSED TODAY  ASSESSMENT AND PLAN  Problem List Items Addressed This Visit     Hypertension associated with type 2 diabetes mellitus (HCC)   Relevant Medications   tirzepatide (MOUNJARO) 2.5 MG/0.5ML Pen   bisoprolol (ZEBETA) 5 MG tablet   chlorthalidone (HYGROTON) 50 MG tablet   simvastatin (ZOCOR) 40 MG tablet   valsartan (DIOVAN) 80 MG tablet   metFORMIN (GLUCOPHAGE) 500 MG tablet   Hyperlipidemia associated with type 2  diabetes mellitus (HCC)   Relevant Medications   tirzepatide (MOUNJARO) 2.5 MG/0.5ML Pen   bisoprolol (ZEBETA) 5 MG tablet   chlorthalidone (HYGROTON) 50 MG tablet   simvastatin (ZOCOR) 40 MG tablet   valsartan (DIOVAN) 80 MG tablet   metFORMIN (GLUCOPHAGE) 500 MG tablet   Vitamin D deficiency   Relevant Medications   Vitamin D, Ergocalciferol, (DRISDOL) 1.25 MG (50000 UNIT) CAPS capsule   BMI 40.0-44.9, adult (HCC)- Current BMI 40.7   Relevant Medications   tirzepatide (MOUNJARO) 2.5 MG/0.5ML Pen   metFORMIN (GLUCOPHAGE) 500 MG tablet   Obesity (HCC)- starting BMI 44.46   Relevant Medications   tirzepatide (MOUNJARO) 2.5 MG/0.5ML Pen   metFORMIN (GLUCOPHAGE) 500 MG tablet   Diabetes mellitus (HCC) - Primary   Relevant Medications   tirzepatide (MOUNJARO) 2.5 MG/0.5ML Pen   Blood Glucose Monitoring Suppl (ONE TOUCH ULTRA 2) w/Device KIT   glucose blood (ONETOUCH ULTRA) test strip   Lancets (ONETOUCH ULTRASOFT) lancets   simvastatin (ZOCOR) 40 MG tablet   valsartan (DIOVAN) 80 MG tablet   metFORMIN  (GLUCOPHAGE) 500 MG tablet   Other Visit Diagnoses     Knee pain, unspecified chronicity, unspecified laterality            Type 2 Diabetes Blood glucose levels mostly within target range, with a few slightly elevated readings. Discussed the potential impact of Mounjaro on appetite and gastrointestinal symptoms. -Reduce Metformin to 500 mg once daily to hopefully decrease Gi upset/diarrhea and monitor closely.  -Continue/refill Mounjaro 2.5mg  weekly and monitor GI tolerance closely.  -Monitor blood glucose levels and report any significant changes. Reordered glucose monitoring device and supplies today.   Knee Pain Recent fall with subsequent knee pain and bruising. No swelling or other significant injury noted. -Trial of over-the-counter Voltaren (diclofenac) gel for topical pain relief. -Consider further evaluation with imaging if pain does not improve.  Hypertension Hypertension asymptomatic, reasonably well controlled, no significant medication side effects noted, and needs further observation.  Medication(s): bisoprolol 5 mg daily   Valsartan 80 mg BID   Chlorthalidone 50 mg daily   Renal function in normal range.   BP Readings from Last 3 Encounters:  04/09/23 (!) 142/81  03/11/23 (!) 148/77  02/12/23 134/76   Lab Results  Component Value Date   CREATININE 0.94 01/26/2023   CREATININE 0.86 03/20/2022   CREATININE 1.00 09/02/2021   No results found for: "GFR"  Plan: Continue/refill  bisoprolol 5 mg daily   Valsartan 80 mg BID   Chlorthalidone 50 mg daily  Continue to work on nutrition plan to promote weight loss and improve BP control.    Vitamin D Deficiency Vitamin D is at goal of 50.  Most recent vitamin D level was 65.9. She is on  prescription ergocalciferol 50,000 IU weekly. Lab Results  Component Value Date   VD25OH 65.9 01/26/2023   VD25OH 57.2 03/20/2022   VD25OH 72.1 09/02/2021    Plan: Continue and refill  prescription ergocalciferol 50,000  IU weekly Low vitamin D levels can be associated with adiposity and may result in leptin resistance and weight gain. Also associated with fatigue. Currently on vitamin D supplementation without any adverse effects.  Recheck vitamin D levels several times yearly to optimize supplementation/avoid over supplementation.    Hyperlipidemia LDL is at goal. Medication(s): zocor 40 mg daily. No side effects Cardiovascular risk factors: advanced age (older than 76 for men, 12 for women), diabetes mellitus, dyslipidemia, hypertension, obesity (BMI >= 30 kg/m2), and sedentary lifestyle  Lab  Results  Component Value Date   CHOL 146 01/26/2023   HDL 61 01/26/2023   LDLCALC 67 01/26/2023   TRIG 97 01/26/2023   CHOLHDL 2.2 09/02/2021   CHOLHDL 2.5 03/11/2021   CHOLHDL 2.4 09/10/2020   Lab Results  Component Value Date   ALT 16 01/26/2023   AST 19 01/26/2023   ALKPHOS 53 01/26/2023   BILITOT 0.3 01/26/2023   The ASCVD Risk score (Arnett DK, et al., 2019) failed to calculate for the following reasons:   The 2019 ASCVD risk score is only valid for ages 18 to 31  Plan: Continue/refill Zocor 40 mg daily.  Continue to work on nutrition plan -decreasing simple carbohydrates, increasing lean proteins, decreasing saturated fats and cholesterol , avoiding trans fats and exercise as able to promote weight loss, improve lipids and decrease cardiovascular risks.     General Health Maintenance -Received influenza vaccine. -Next appointment scheduled for 11:45 on 05/07/2023. ATTESTASTION STATEMENTS:  Reviewed by clinician on day of visit: allergies, medications, problem list, medical history, surgical history, family history, social history, and previous encounter notes.   I have personally spent 48 minutes total time today in preparation, patient care, nutritional counseling and documentation for this visit, including the following: review of clinical lab tests; review of medical  tests/procedures/services.      Kavir Savoca, PA-C

## 2023-05-07 ENCOUNTER — Ambulatory Visit (INDEPENDENT_AMBULATORY_CARE_PROVIDER_SITE_OTHER): Payer: Medicare Other | Admitting: Physician Assistant

## 2023-05-07 ENCOUNTER — Encounter (INDEPENDENT_AMBULATORY_CARE_PROVIDER_SITE_OTHER): Payer: Self-pay | Admitting: Physician Assistant

## 2023-05-07 VITALS — BP 137/71 | HR 59 | Temp 97.7°F | Ht 64.0 in | Wt 229.0 lb

## 2023-05-07 DIAGNOSIS — E669 Obesity, unspecified: Secondary | ICD-10-CM | POA: Diagnosis not present

## 2023-05-07 DIAGNOSIS — E1159 Type 2 diabetes mellitus with other circulatory complications: Secondary | ICD-10-CM

## 2023-05-07 DIAGNOSIS — I152 Hypertension secondary to endocrine disorders: Secondary | ICD-10-CM

## 2023-05-07 DIAGNOSIS — Z6839 Body mass index (BMI) 39.0-39.9, adult: Secondary | ICD-10-CM

## 2023-05-07 DIAGNOSIS — Z7985 Long-term (current) use of injectable non-insulin antidiabetic drugs: Secondary | ICD-10-CM

## 2023-05-07 MED ORDER — TIRZEPATIDE 2.5 MG/0.5ML ~~LOC~~ SOAJ: 2.5000 mg | SUBCUTANEOUS | 0 refills | Status: DC

## 2023-05-07 MED ORDER — TIRZEPATIDE 2.5 MG/0.5ML ~~LOC~~ SOAJ: 2.5000 mg | SUBCUTANEOUS | 2 refills | Status: DC

## 2023-05-07 NOTE — Progress Notes (Signed)
SUBJECTIVE:  Chief Complaint: Obesity  Interim History: Kathleen [name] is a 82 year old female with a history of type 2 diabetes, hypertension, hypercholesterolemia, and vitamin D deficiency, presents for a follow-up visit regarding her obesity treatment plan. She has been on metformin, Mounjaro, valsartan, chlorthalidone, bisoprolol, and Zocor for the management of her chronic conditions.  The patient reports a significant response to [facility], including a substantial decrease in appetite at times. She does note days where her calorie intake is < 1000 and she is not always meeting her protein needs. We discussed the importance of regular meals and adequate protein intake for weight loss and overall health.  On review of the bioimpedance scale: Muscle mass -0.2 pounds Adipose mass -4.6 pounds Total body water -1.6 pounds .She has noticed a decrease in her fasting blood sugar levels, with readings ranging from 105 to 142.  The patient has also noticed a weight loss of approximately 5 pounds, which she attributes to the [facility].  Despite the low caloric intake, the patient reports feeling satisfied and not undernourished.  The patient also reports a decrease in ankle swelling and an improvement in blood pressure readings. She expresses a long-term goal of reducing her weight to under 200 pounds. The patient is aware of the need to monitor her blood pressure at home, especially if she experiences symptoms such as lightheadedness.  In summary, the patient presents with a positive response to [facility] in the management of her type 2 diabetes and obesity, with a decrease in appetite, improved blood sugar control, and weight loss. However, she also reports episodes of diarrhea and nausea, which we will monitor closely over the next several weeks and will not increase her medication at this point despite this being the typical loading dose for [facility]. The patient is motivated to continue with her  treatment plan and is focused on achieving her weight loss goal.  Kathleen [name] is here to discuss her progress with her obesity treatment plan. She is on the keeping a food journal and adhering to recommended goals of 1100-1200 calories and 85 grams of protein and states she is following her eating plan approximately 30 % of the time. She states she is exercising walking 10 minutes 5 times per week.   OBJECTIVE: Visit Diagnoses: Problem List Items Addressed This Visit     Hypertension associated with type 2 diabetes mellitus (HCC)   Relevant Medications   tirzepatide (MOUNJARO) 2.5 MG/0.5ML Pen   Vitamin D deficiency   Obesity (HCC)- starting BMI 44.46   Relevant Medications   tirzepatide (MOUNJARO) 2.5 MG/0.5ML Pen   Diabetes mellitus (HCC) - Primary   Relevant Medications   tirzepatide (MOUNJARO) 2.5 MG/0.5ML Pen  Obesity On Mounjaro 2.5 mg weekly for two months with significant appetite suppression, reduced caloric intake, and weight loss.  Some intermittent nausea and diarrhea, possibly related to medication or overeating.  Lost ~5 pounds of adipose tissue while maintaining muscle mass over the past 4 weeks.  Discussed risks of under-eating and metabolic rate drop. Informed about potential side effects and need to monitor symptoms. Goal is to promote weight loss while maintaining muscle mass and overall well-being. - Continue/refill Mounjaro 2.5 mg weekly - Stop metformin as this may be contributing to intermittent nausea and diarrhea. - Monitor for nausea and diarrhea - Refill Mounjaro prescription - Follow up in 7 weeks with fasting labs  Type 2 Diabetes Mellitus Lab Results  Component Value Date   HGBA1C 5.9 (H) 01/26/2023   HGBA1C 5.9 (H) 03/20/2022  HGBA1C 6.0 (H) 09/02/2021   Lab Results  Component Value Date   LDLCALC 67 01/26/2023   CREATININE 0.94 01/26/2023   Blood glucose levels well-controlled with Mounjaro and reduced metformin. Fasting blood sugars ranged from  105 to 142 mg/dL. Discussed stopping metformin to reduce nausea and diarrhea while maintaining good blood sugar control with Mounjaro. - Monitor blood glucose levels - Stop metformin for now to try to decrease GI side effects - Continue/refill Mounjaro 2.5 mg weekly  Hypertension Blood pressure well-controlled with valsartan 80 mg twice daily, chlorthalidone 50 mg daily, and bisoprolol 5 mg daily. Today's reading was 137/71 mmHg. Discussed potential need to reduce chlorthalidone if blood pressure remains consistently low. - Monitor blood pressure - Consider reducing chlorthalidone first if blood pressure remains consistently low as she has noted significant improvement in her lower extremity edema. Continue to work on nutrition plan to promote weight loss and improve BP control.    General Health Maintenance Discussed dietary strategies for upcoming holidays to manage conditions, including portion control and reducing sugar intake. Suggested using sugar substitutes like applesauce and Splenda in recipes. - Encourage portion control and balanced diet during holidays - Consider using sugar substitutes like applesauce and Splenda in recipes  Follow-up - Schedule follow-up appointment for January 8th at 8:30 AM with fasting labs.  Vitals Temp: 97.7 F (36.5 C) BP: 137/71 Pulse Rate: (!) 59 SpO2: 98 %   Anthropometric Measurements Height: 5\' 4"  (1.626 m) Weight: 229 lb (103.9 kg) BMI (Calculated): 39.29 Weight at Last Visit: 234 lb Weight Lost Since Last Visit: 5 lb Weight Gained Since Last Visit: 0 Starting Weight: 259 lb Total Weight Loss (lbs): 30 lb (13.6 kg) Peak Weight: 283 lb   Body Composition  Body Fat %: 50.3 % Fat Mass (lbs): 115.4 lbs Muscle Mass (lbs): 108.4 lbs Total Body Water (lbs): 82.6 lbs Visceral Fat Rating : 18   Other Clinical Data Fasting: no Labs: no Today's Visit #: 85 Starting Date: 04/12/18     ASSESSMENT AND PLAN:  Diet: Kathleen [name] is  currently in the action stage of change. As such, her goal is to continue with weight loss efforts. She has agreed to keeping a food journal and adhering to recommended goals of 1100-1200 calories and 85 grams of protein.  Exercise: Kathleen [name] has been instructed to continue exercising as is for weight loss and overall health benefits.   Behavior Modification:  We discussed the following Behavioral Modification Strategies today: increasing lean protein intake, decreasing simple carbohydrates, increasing vegetables, increase H2O intake, increase high fiber foods, no skipping meals, holiday eating strategies, and keep a strict food journal. We discussed various medication options to help Kathleen [name] with her weight loss efforts and we both agreed to continue Mounjaro 2.5 mg weekly for Type 2 diabetes management.  Return in about 6 weeks (around 06/18/2023).Marland Kitchen She was informed of the importance of frequent follow up visits to maximize her success with intensive lifestyle modifications for her multiple health conditions.  Attestation Statements:   Reviewed by clinician on day of visit: allergies, medications, problem list, medical history, surgical history, family history, social history, and previous encounter notes.   Time spent on visit including pre-visit chart review and post-visit care and charting was 38 minutes.    Keng Jewel, PA-C

## 2023-06-03 ENCOUNTER — Telehealth (INDEPENDENT_AMBULATORY_CARE_PROVIDER_SITE_OTHER): Payer: Self-pay | Admitting: Physician Assistant

## 2023-06-03 NOTE — Telephone Encounter (Signed)
Pt is calling in wanting a call back to let her know what will happen if she misses a dose of Mounjaro.  She is currently needing to have it refilled on 06/11/2023 but her pharmacy does not have it in stock and they are looking to see if they can find it for the pt.  Pt is very concerned about missing a dosage until her next appt on 06/24/2023 @ 8:30.

## 2023-06-03 NOTE — Telephone Encounter (Signed)
Spoke to patient and advised her to follow her plan as closely as possible until [name] is able to be filled. Patient stated that her pharmacy is looking for another pharmacy that has it in stock. I told patient that if her pharmacy is unable to send it somewhere else and she is able to find it to let us know  and we can resend.

## 2023-06-23 NOTE — Progress Notes (Signed)
 SUBJECTIVE: Discussed the use of AI scribe software for clinical note transcription with the patient, who gave verbal consent to proceed. Chief Complaint: Obesity  Interim History: She has maintained her weight since her last visit.  Bio impedence scale reviewed with the patient:  Muscle mass + 0.8 lbs Adipose mass - 1 lb Total body water - 1.8 lbs.   Down 30 lbs overall TBW loss of 11.6%  Kathleen  [name] is a 83 year old female with a history of type 2 diabetes, hyperlipidemia, hypertension, and vitamin D  deficiency, presents for a follow-up visit regarding her obesity treatment plan. She is currently on Mounjaro  2.5mg  weekly for diabetes management. Previously, she was on Metformin  500mg  daily but discontinued due to gastrointestinal upset and diarrhea. She also takes Ergocalciferol  50,000 units weekly for vitamin D  deficiency.  Over the holiday period, the patient reports feeling exhausted but not sleepy, attributing this to a busy schedule with her grandchildren and the installation of a new heating system. She admits to not adhering to her meal plan during this period due to the demands of caring for her grandchildren and sometimes experiencing a lack of appetite. Despite this, she has managed to build some muscle and lose a pound of adipose tissue.  The patient reports that since discontinuing Metformin , she rarely experiences diarrhea. However, she notes that high-fiber foods such as cabbage and collards seem to trigger some diarrhea. She also reports fluctuations in her blood sugar levels, with readings ranging from 106 to 140.   The patient acknowledges that she has not been meeting her protein goals, with daily totals ranging from 20 to 75 grams. She expresses interest in incorporating more protein into her diet, and we discussed options like Fairlife milk to add protein . She also mentions having some high-sugar foods in her house, such as peanut brittle and fruitcake, which she is  trying to consume in moderation.  She also mentions a history of polyps and colon cancer, with her last colonoscopy being two years ago. She reports no current issues with her vitamin D  levels.  Kathleen [name]  is here to discuss her progress with her obesity treatment plan. She is on the keeping a food journal and adhering to recommended goals of 1000-1200 calories and 85 grams of  protein and states she is following her eating plan approximately 50 % of the time. She states she is not exercising 0 minutes 0 times per week.  Fasting labs obtained today.  She was informed we would discuss her lab results at her next visit unless there is a critical issue that needs to be addressed sooner. She agreed to keep her next visit at the agreed upon time to discuss these results.    OBJECTIVE: Visit Diagnoses: Problem List Items Addressed This Visit     Hypertension associated with type 2 diabetes mellitus (HCC)   Relevant Medications   tirzepatide  (MOUNJARO ) 5 MG/0.5ML Pen   Hyperlipidemia associated with type 2 diabetes mellitus (HCC)   Relevant Medications   tirzepatide  (MOUNJARO ) 5 MG/0.5ML Pen   Other Relevant Orders   Lipid Panel With LDL/HDL Ratio   Vitamin D  deficiency   Relevant Orders   VITAMIN D  25 Hydroxy (Vit-D Deficiency, Fractures)   Obesity (HCC)- starting BMI 44.46   Relevant Medications   tirzepatide  (MOUNJARO ) 5 MG/0.5ML Pen   Diabetes mellitus (HCC) - Primary   Relevant Medications   tirzepatide  (MOUNJARO ) 5 MG/0.5ML Pen   Other Relevant Orders   CMP14+EGFR   Hemoglobin A1c   Insulin , random  Other Visit Diagnoses       Other fatigue       Relevant Orders   Vitamin B12   CBC with Differential/Platelet   TSH     Obesity 83 year old with slight decrease in adipose tissue and increase in muscle mass despite dietary challenges. Discussed importance of protein intake and potential benefits of Fairlife milk and could use chocolate milk for snack.   Advised avoiding  high-calorie, low-nutrient foods. - Continue dietary plan with focus on 75 grams of protein daily. - Consider Fairlife (chocolate) milk as a supplement. - Follow up in four weeks.  Type 2 Diabetes Mellitus Lab Results  Component Value Date   HGBA1C 5.9 (H) 01/26/2023   HGBA1C 5.9 (H) 03/20/2022   HGBA1C 6.0 (H) 09/02/2021   Lab Results  Component Value Date   LDLCALC 67 01/26/2023   CREATININE 0.94 01/26/2023    Managed with Mounjaro  2.5 mg weekly. Blood glucose levels variable, possibly due to dehydration and dietary inconsistencies. Metformin  discontinued due to gastrointestinal side effects. Discussed increasing Mounjaro  to 5 mg weekly and emphasized hydration. - Increase Mounjaro  to 5 mg weekly . - Ensure adequate hydration. - Monitor blood glucose levels regularly. - Order CBC/CMET and fasting glucose/insulin  levels and lipids today She is working  on nutrition plan to decrease simple carbohydrates, increase lean proteins and exercise to promote weight loss and improve glycemic control .  Fatigue Reports of exhaustion likely related to increased activity and stress. Discussed importance of balanced diet and hydration. - Order CBC and B12/TSH and vitamin D  levels to evaluate underlying causes.  Hyperlipidemia LDL is at goal. Medication(s): Zocor  40 mg daily. No side effects and started Mounjaro  for Type 2 DM but should also lower CV risks.  Cardiovascular risk factors: advanced age (older than 78 for men, 1 for women), diabetes mellitus, dyslipidemia, hypertension, obesity (BMI >= 30 kg/m2), and sedentary lifestyle  Lab Results  Component Value Date   CHOL 146 01/26/2023   HDL 61 01/26/2023   LDLCALC 67 01/26/2023   TRIG 97 01/26/2023   CHOLHDL 2.2 09/02/2021   CHOLHDL 2.5 03/11/2021   CHOLHDL 2.4 09/10/2020   Lab Results  Component Value Date   ALT 16 01/26/2023   AST 19 01/26/2023   ALKPHOS 53 01/26/2023   BILITOT 0.3 01/26/2023   The ASCVD Risk score (Arnett  DK, et al., 2019) failed to calculate for the following reasons:   The 2019 ASCVD risk score is only valid for ages 40 to 62  Plan: Continue Zocor  and Mounjaro .  Continue to work on nutrition plan -decreasing simple carbohydrates, increasing lean proteins, decreasing saturated fats and cholesterol , avoiding trans fats and exercise as able to promote weight loss, improve lipids and decrease cardiovascular risks. Recheck fasting lipid panel today.,   Hypertension Hypertension improved, asymptomatic, and no significant medication side effects noted.  Medication(s): bisoprolol  5 mg daily                         Valsartan  80 mg BID                         Chlorthalidone  50 mg daily                         Renal function in normal range.   BP Readings from Last 3 Encounters:  06/24/23 126/73  05/07/23 137/71  04/09/23 (!) 142/81  Lab Results  Component Value Date   CREATININE 0.94 01/26/2023   CREATININE 0.86 03/20/2022   CREATININE 1.00 09/02/2021   No results found for: GFR  Plan: Continue all antihypertensives at current dosages. Continue to work on nutrition plan to promote weight loss and improve BP control.  Monitor for further improvements in BP/ may need to decrease medications as continues with weight loss with Mounjaro .  Advised to continue to work on adequate hydration- at least 80 oz of fluids daily.  Recheck labs today.    Vitamin D  Deficiency Managed with ergocalciferol  50,000 units weekly. Recent levels high enough to consider discontinuation. Discussed holding ergocalciferol  and reassessing levels with current labs. Potential need for OTC vitamin D  supplementation to be discussed next visit. Low vitamin D  levels can be associated with adiposity and may result in leptin resistance and weight gain. Also associated with fatigue. Currently on vitamin D  supplementation without any adverse effects but has completed course.  Last vitamin D  level 65.9 and much improved  overall 01/26/23.  Will recheck vitamin D  level before continuing on Ergocalciferol  to avoid supra therapeutic level.  - Reassess vitamin D  levels with current labs. - Discuss potential OTC vitamin D  supplementation at next visit.  Follow-up - Schedule follow-up in four weeks on February 5th at 10 AM. - Review lab results and adjust treatment plan as necessary.  Vitals Temp: 98.4 F (36.9 C) BP: 126/73 Pulse Rate: (!) 59 SpO2: 98 %   Anthropometric Measurements Height: 5' 4 (1.626 m) Weight: 229 lb (103.9 kg) BMI (Calculated): 39.29 Weight at Last Visit: 229 lb Weight Lost Since Last Visit: 0 Weight Gained Since Last Visit: 0 Starting Weight: 259 lb Total Weight Loss (lbs): 30 lb (13.6 kg) Peak Weight: 283 lb   Body Composition  Body Fat %: 49.9 % Fat Mass (lbs): 114.4 lbs Muscle Mass (lbs): 109.2 lbs Total Body Water (lbs): 80.8 lbs Visceral Fat Rating : 18   Other Clinical Data Fasting: yes Labs: yes Today's Visit #: 65 Starting Date: 04/12/18 Comments: fasting labs     ASSESSMENT AND PLAN:  Diet: Kathleen [name]  is currently in the action stage of change. As such, her goal is to continue with weight loss efforts. She has agreed to keeping a food journal and adhering to recommended goals of 1000-1200 calories and 75-85 grams of protein.  Exercise: Kathleen [name]  has been instructed that some exercise is better than none for weight loss and overall health benefits.   Behavior Modification:  We discussed the following Behavioral Modification Strategies today: increasing lean protein intake, decreasing simple carbohydrates, increasing vegetables, increase H2O intake, increase high fiber foods, no skipping meals, better snacking choices, avoiding temptations, planning for success, and keep a strict food journal. We discussed various medication options to help Kathleen [name]  with her weight loss efforts and we both agreed to increase Mounjaro  to 5 mg weekly and monitor  closely.  Return in about 4 weeks (around 07/22/2023).Kathleen [name] She was informed of the importance of frequent follow up visits to maximize her success with intensive lifestyle modifications for her multiple health conditions.  Attestation Statements:   Reviewed by clinician on day of visit: allergies, medications, problem list, medical history, surgical history, family history, social history, and previous encounter notes.   Time spent on visit including pre-visit chart review and post-visit care and charting was 44 minutes.    Kathleen Achee, PA-C

## 2023-06-24 ENCOUNTER — Encounter (INDEPENDENT_AMBULATORY_CARE_PROVIDER_SITE_OTHER): Payer: Self-pay | Admitting: Physician Assistant

## 2023-06-24 ENCOUNTER — Ambulatory Visit (INDEPENDENT_AMBULATORY_CARE_PROVIDER_SITE_OTHER): Payer: Medicare Other | Admitting: Physician Assistant

## 2023-06-24 VITALS — BP 126/73 | HR 59 | Temp 98.4°F | Ht 64.0 in | Wt 229.0 lb

## 2023-06-24 DIAGNOSIS — E559 Vitamin D deficiency, unspecified: Secondary | ICD-10-CM | POA: Diagnosis not present

## 2023-06-24 DIAGNOSIS — I152 Hypertension secondary to endocrine disorders: Secondary | ICD-10-CM | POA: Diagnosis not present

## 2023-06-24 DIAGNOSIS — E1169 Type 2 diabetes mellitus with other specified complication: Secondary | ICD-10-CM

## 2023-06-24 DIAGNOSIS — R5383 Other fatigue: Secondary | ICD-10-CM

## 2023-06-24 DIAGNOSIS — Z7985 Long-term (current) use of injectable non-insulin antidiabetic drugs: Secondary | ICD-10-CM | POA: Diagnosis not present

## 2023-06-24 DIAGNOSIS — E1159 Type 2 diabetes mellitus with other circulatory complications: Secondary | ICD-10-CM | POA: Diagnosis not present

## 2023-06-24 DIAGNOSIS — E785 Hyperlipidemia, unspecified: Secondary | ICD-10-CM | POA: Diagnosis not present

## 2023-06-24 DIAGNOSIS — Z6839 Body mass index (BMI) 39.0-39.9, adult: Secondary | ICD-10-CM

## 2023-06-24 DIAGNOSIS — E669 Obesity, unspecified: Secondary | ICD-10-CM

## 2023-06-24 MED ORDER — TIRZEPATIDE 5 MG/0.5ML ~~LOC~~ SOAJ: 5.0000 mg | SUBCUTANEOUS | 0 refills | Status: DC

## 2023-06-25 ENCOUNTER — Encounter (INDEPENDENT_AMBULATORY_CARE_PROVIDER_SITE_OTHER): Payer: Self-pay | Admitting: Physician Assistant

## 2023-06-25 LAB — CBC WITH DIFFERENTIAL/PLATELET
Basophils Absolute: 0 10*3/uL (ref 0.0–0.2)
Basos: 1 %
EOS (ABSOLUTE): 0.5 10*3/uL — ABNORMAL HIGH (ref 0.0–0.4)
Eos: 6 %
Hematocrit: 35.8 % (ref 34.0–46.6)
Hemoglobin: 11.7 g/dL (ref 11.1–15.9)
Immature Grans (Abs): 0 10*3/uL (ref 0.0–0.1)
Immature Granulocytes: 0 %
Lymphocytes Absolute: 1.9 10*3/uL (ref 0.7–3.1)
Lymphs: 23 %
MCH: 30.2 pg (ref 26.6–33.0)
MCHC: 32.7 g/dL (ref 31.5–35.7)
MCV: 93 fL (ref 79–97)
Monocytes Absolute: 0.5 10*3/uL (ref 0.1–0.9)
Monocytes: 6 %
Neutrophils Absolute: 5.3 10*3/uL (ref 1.4–7.0)
Neutrophils: 64 %
Platelets: 247 10*3/uL (ref 150–450)
RBC: 3.87 x10E6/uL (ref 3.77–5.28)
RDW: 12.8 % (ref 11.7–15.4)
WBC: 8.2 10*3/uL (ref 3.4–10.8)

## 2023-06-25 LAB — LIPID PANEL WITH LDL/HDL RATIO
Cholesterol, Total: 143 mg/dL (ref 100–199)
HDL: 60 mg/dL (ref >39)
LDL Chol Calc (NIH): 69 mg/dL (ref 0–99)
LDL/HDL Ratio: 1.2 {ratio} (ref 0.0–3.2)
Triglycerides: 72 mg/dL (ref 0–149)
VLDL Cholesterol Cal: 14 mg/dL (ref 5–40)

## 2023-06-25 LAB — HEMOGLOBIN A1C
Est. average glucose Bld gHb Est-mCnc: 123 mg/dL
Hgb A1c MFr Bld: 5.9 % — ABNORMAL HIGH (ref 4.8–5.6)

## 2023-06-25 LAB — INSULIN, RANDOM: INSULIN: 20.2 u[IU]/mL (ref 2.6–24.9)

## 2023-06-25 LAB — CMP14+EGFR
ALT: 14 [IU]/L (ref 0–32)
AST: 15 [IU]/L (ref 0–40)
Albumin: 4.4 g/dL (ref 3.7–4.7)
Alkaline Phosphatase: 67 [IU]/L (ref 44–121)
BUN/Creatinine Ratio: 32 — ABNORMAL HIGH (ref 12–28)
BUN: 30 mg/dL — ABNORMAL HIGH (ref 8–27)
Bilirubin Total: 0.3 mg/dL (ref 0.0–1.2)
CO2: 26 mmol/L (ref 20–29)
Calcium: 9.7 mg/dL (ref 8.7–10.3)
Chloride: 100 mmol/L (ref 96–106)
Creatinine, Ser: 0.93 mg/dL (ref 0.57–1.00)
Globulin, Total: 2.6 g/dL (ref 1.5–4.5)
Glucose: 101 mg/dL — ABNORMAL HIGH (ref 70–99)
Potassium: 4.3 mmol/L (ref 3.5–5.2)
Sodium: 138 mmol/L (ref 134–144)
Total Protein: 7 g/dL (ref 6.0–8.5)
eGFR: 62 mL/min/{1.73_m2} (ref >59)

## 2023-06-25 LAB — VITAMIN D 25 HYDROXY (VIT D DEFICIENCY, FRACTURES): Vit D, 25-Hydroxy: 83.3 ng/mL (ref 30.0–100.0)

## 2023-06-25 LAB — TSH: TSH: 1.36 u[IU]/mL (ref 0.450–4.500)

## 2023-06-25 LAB — VITAMIN B12: Vitamin B-12: 584 pg/mL (ref 232–1245)

## 2023-07-22 ENCOUNTER — Ambulatory Visit (INDEPENDENT_AMBULATORY_CARE_PROVIDER_SITE_OTHER): Payer: Medicare Other | Admitting: Physician Assistant

## 2023-07-22 ENCOUNTER — Encounter (INDEPENDENT_AMBULATORY_CARE_PROVIDER_SITE_OTHER): Payer: Self-pay | Admitting: Physician Assistant

## 2023-07-22 VITALS — BP 149/76 | HR 62 | Temp 97.7°F | Ht 64.0 in | Wt 226.0 lb

## 2023-07-22 DIAGNOSIS — E1159 Type 2 diabetes mellitus with other circulatory complications: Secondary | ICD-10-CM | POA: Diagnosis not present

## 2023-07-22 DIAGNOSIS — Z7985 Long-term (current) use of injectable non-insulin antidiabetic drugs: Secondary | ICD-10-CM | POA: Diagnosis not present

## 2023-07-22 DIAGNOSIS — Z6838 Body mass index (BMI) 38.0-38.9, adult: Secondary | ICD-10-CM

## 2023-07-22 DIAGNOSIS — R1013 Epigastric pain: Secondary | ICD-10-CM | POA: Diagnosis not present

## 2023-07-22 DIAGNOSIS — E785 Hyperlipidemia, unspecified: Secondary | ICD-10-CM | POA: Diagnosis not present

## 2023-07-22 DIAGNOSIS — I152 Hypertension secondary to endocrine disorders: Secondary | ICD-10-CM

## 2023-07-22 DIAGNOSIS — E1169 Type 2 diabetes mellitus with other specified complication: Secondary | ICD-10-CM

## 2023-07-22 DIAGNOSIS — E559 Vitamin D deficiency, unspecified: Secondary | ICD-10-CM

## 2023-07-22 DIAGNOSIS — E669 Obesity, unspecified: Secondary | ICD-10-CM

## 2023-07-22 MED ORDER — FAMOTIDINE-CA CARB-MAG HYDROX 10-800-165 MG PO CHEW: 1.0000 | CHEWABLE_TABLET | Freq: Two times a day (BID) | ORAL | 0 refills | Status: DC | PRN

## 2023-07-22 MED ORDER — TIRZEPATIDE 5 MG/0.5ML ~~LOC~~ SOAJ: 5.0000 mg | SUBCUTANEOUS | 0 refills | Status: DC

## 2023-07-22 NOTE — Progress Notes (Signed)
 SUBJECTIVE: Discussed the use of AI scribe software for clinical note transcription with the patient, who gave verbal consent to proceed.  Chief Complaint: Obesity  Interim History: She is down 3 lbs since last visit.  Kathleen [name] is an 83 year old female with obesity who presents for follow-up of her obesity treatment plan.  She has been on Mounjaro  5 mg weekly, resulting in a steady decline in weight. Her current weight is 226 pounds, which is near her lowest weight in 2022. She is trying to adhere to her calorie goals and has discontinued soft drinks, opting for decaf tea and half-caffeine coffee instead. She is concerned about her protein intake, noting some days she only consumes 41 grams, but typically ranges from 60 to 85 grams.  She experiences intermittent diarrhea and bloating, describing the sensation as her stomach feeling 'swelled to the point of taking off like a hot air balloon.' She has eliminated high-fiber foods such as cabbage, broccoli, and beans from her diet due to these symptoms. She has not tried any over-the-counter medications for these symptoms but mentioned taking Pepto Bismol, which provided relief. We discussed trying some Pepcid  complete and discussed that these are typical effects associated with Mounjaro  and typically improve over time, but we will continue to monitor closely.   Her blood sugar levels have been stable since starting Mounjaro , with fasting levels ranging from 91 to 121 mg/dL. Her hemoglobin A1c is stable at 5.9%.  Her current medications include Mounjaro  5 mg weekly and a B complex vitamin. She also takes Allegra daily for allergies. Recent labs showed stable glucose levels, improved kidney function, good liver function, slightly high vitamin D  levels, and better B12 levels at 584.  Socially, she is active in her church and helps out at [employer]. She has a supportive family, including a husband who is in good health at 7 years  old, and a son who was recently promoted to assistant chief in the [employer].   Kathleen [name]  is here to discuss her progress with her obesity treatment plan. She is on the keeping a food journal and adhering to recommended goals of 1000-1200 calories and 75-85 grams of protein and states she is following her eating plan approximately 90 % of the time. She states she is not exercising 0 minutes 0 times per week.   OBJECTIVE: Visit Diagnoses: Problem List Items Addressed This Visit     Hypertension associated with type 2 diabetes mellitus (HCC)   Relevant Medications   tirzepatide  (MOUNJARO ) 5 MG/0.5ML Pen   Hyperlipidemia associated with type 2 diabetes mellitus (HCC)   Relevant Medications   tirzepatide  (MOUNJARO ) 5 MG/0.5ML Pen   Vitamin D  deficiency   Obesity (HCC)- starting BMI 44.46   Relevant Medications   tirzepatide  (MOUNJARO ) 5 MG/0.5ML Pen   Diabetes mellitus (HCC) - Primary   Relevant Medications   tirzepatide  (MOUNJARO ) 5 MG/0.5ML Pen   Other Visit Diagnoses       Dyspepsia       Relevant Medications   famotidine -calcium carbonate-magnesium hydroxide (PEPCID  COMPLETE) [id number] MG chewable tablet     BMI 38.0-38.9,adult Current BMI 38.9         Obesity Kathleen  [name], 82, has been on Mounjaro  5 mg weekly, resulting in a steady weight decline. Current weight is 226 lbs. She reports bloating, diarrhea, and excess gas, likely due to Mounjaro 's gastrointestinal side effects, which typically improve over time. Discussed potential dose adjustment if symptoms persist and the benefits  of Mounjaro  in weight reduction and diabetes control. - Continue Mounjaro  5 mg weekly - Refill Mounjaro  prescription - Consider taking Mounjaro  dose later in the day or the next day if traveling - Recommend Pepcid  Complete up to twice daily for bloating and gas - Monitor gastrointestinal symptoms and consider dose adjustment if symptoms persist  Type 2 Diabetes Mellitus Blood  glucose levels are well-controlled with fasting levels ranging from 91 to 121 mg/dL. Hemoglobin A1c is stable at 5.9%. Emphasized the importance of maintaining blood glucose control to prevent complications. Lab Results  Component Value Date   HGBA1C 5.9 (H) 06/24/2023   HGBA1C 5.9 (H) 01/26/2023   HGBA1C 5.9 (H) 03/20/2022   Lab Results  Component Value Date   LDLCALC 69 06/24/2023   CREATININE 0.93 06/24/2023    On Mounjaro  5 mg weekly and will refill at current dosage and monitor closely - Continue current diabetes management plan - Monitor blood glucose levels regularly She is working  on nutrition plan to decrease simple carbohydrates, increase lean proteins and exercise to promote weight loss and improve glycemic control .  Hypertension Emphasized the importance of blood pressure control to prevent cardiovascular complications. On hygroton  50 mg daily, valsartan  80 mg BID, bisoprolol  5 mg daily. Renal function improved. No SE with medication.  - Continue current antihypertensive medications Continue to work on nutrition plan to promote weight loss and improve BP control.    Hyperlipidemia Lipid panel results are within target ranges. Discussed the role of lipid control in reducing cardiovascular risk. Continue Mounjaro  - Continue current lipid-lowering therapy Continue to work on nutrition plan -decreasing simple carbohydrates, increasing lean proteins, decreasing saturated fats and cholesterol , avoiding trans fats and exercise as able to promote weight loss, improve lipids and decrease cardiovascular risks.  Vitamin D  Deficiency Vitamin D  levels are slightly high. Currently not taking supplements   Plan to monitor levels and consider restarting supplementation once appropriate. Some GI upset possibly related to vitamin D .  - Monitor vitamin D  levels - Consider restarting oral vitamin D  once levels appropriate.   Dyspepsia Some dyspepsia/bloating with Mounjaro  . Will start  Pepcid  complete BID and monitor for response.   General Health Maintenance Overall health is well-managed. Labs show stable kidney and liver function. White blood cell count and breakdown are normal. Eosinophils slightly elevated, likely due to allergies. Last colonoscopy in 2022 was normal. Next colonoscopy is due at the end of 2025. - Continue taking Effexorfen (Allegra) daily for allergies - Monitor for any new symptoms or changes in health status  Follow-up - Follow-up appointment on March 5th at 11:00 AM.  Vitals Temp: 97.7 F (36.5 C) BP: (!) 149/76 Pulse Rate: 62 SpO2: 95 %   Anthropometric Measurements Height: 5' 4 (1.626 m) Weight: 226 lb (102.5 kg) BMI (Calculated): 38.77 Weight at Last Visit: 229 lb Weight Lost Since Last Visit: 3 lb Weight Gained Since Last Visit: 0 Starting Weight: 259 lb Total Weight Loss (lbs): 33 lb (15 kg) Peak Weight: 283 lb   Body Composition  Body Fat %: 49.9 % Fat Mass (lbs): 113 lbs Muscle Mass (lbs): 107.6 lbs Total Body Water (lbs): 80 lbs Visceral Fat Rating : 18   Other Clinical Data Fasting: no Labs: no Today's Visit #: 48 Starting Date: 04/12/18     ASSESSMENT AND PLAN:  Diet: Channing  is currently in the action stage of change. As such, her goal is to continue with weight loss efforts. She has agreed to keeping a food journal  and adhering to recommended goals of 1000-1200 calories and 85 grams protein.  Exercise: Shandon  has been instructed to try a geriatric exercise plan and that some exercise is better than none for weight loss and overall health benefits.   Behavior Modification:  We discussed the following Behavioral Modification Strategies today: increasing lean protein intake, decreasing simple carbohydrates, increasing vegetables, increase H2O intake, increase high fiber foods, avoiding temptations, planning for success, and keep a strict food journal. We discussed various medication options to help  Surina  with her weight loss efforts and we both agreed to continue Mounjaro  5 mg weekly for Type 2 diabetes and continue to work on nutritional and behavioral strategies to promote weight loss.  .  Return in about 4 weeks (around 08/19/2023).SABRA She was informed of the importance of frequent follow up visits to maximize her success with intensive lifestyle modifications for her multiple health conditions.  Attestation Statements:   Reviewed by clinician on day of visit: allergies, medications, problem list, medical history, surgical history, family history, social history, and previous encounter notes.   Time spent on visit including pre-visit chart review and post-visit care and charting was 45 minutes.    Kittie Krizan, PA-C

## 2023-08-19 ENCOUNTER — Ambulatory Visit (INDEPENDENT_AMBULATORY_CARE_PROVIDER_SITE_OTHER): Payer: Medicare Other | Admitting: Physician Assistant

## 2023-08-19 ENCOUNTER — Encounter (INDEPENDENT_AMBULATORY_CARE_PROVIDER_SITE_OTHER): Payer: Self-pay | Admitting: Physician Assistant

## 2023-08-19 VITALS — BP 138/74 | HR 53 | Temp 97.5°F | Ht 64.0 in | Wt 225.0 lb

## 2023-08-19 DIAGNOSIS — E669 Obesity, unspecified: Secondary | ICD-10-CM

## 2023-08-19 DIAGNOSIS — I152 Hypertension secondary to endocrine disorders: Secondary | ICD-10-CM | POA: Diagnosis not present

## 2023-08-19 DIAGNOSIS — K8050 Calculus of bile duct without cholangitis or cholecystitis without obstruction: Secondary | ICD-10-CM

## 2023-08-19 DIAGNOSIS — R1011 Right upper quadrant pain: Secondary | ICD-10-CM

## 2023-08-19 DIAGNOSIS — E559 Vitamin D deficiency, unspecified: Secondary | ICD-10-CM

## 2023-08-19 DIAGNOSIS — E1159 Type 2 diabetes mellitus with other circulatory complications: Secondary | ICD-10-CM

## 2023-08-19 DIAGNOSIS — Z7985 Long-term (current) use of injectable non-insulin antidiabetic drugs: Secondary | ICD-10-CM

## 2023-08-19 DIAGNOSIS — Z6838 Body mass index (BMI) 38.0-38.9, adult: Secondary | ICD-10-CM

## 2023-08-19 MED ORDER — BISOPROLOL FUMARATE 5 MG PO TABS: 5.0000 mg | ORAL_TABLET | Freq: Every day | ORAL | 0 refills | Status: DC

## 2023-08-19 NOTE — Progress Notes (Signed)
 SUBJECTIVE: Discussed the use of AI scribe software for clinical note transcription with the patient, who gave verbal consent to proceed.  Chief Complaint: Obesity  Interim History: She is down 1 lb since last visit. Down 34 lbs overall TBW loss of 13.2 %  Her weight has steadily decreased since starting Mounjaro in 10/24.   Kathleen [name] is here to discuss her progress with her obesity treatment plan. She is on the keeping a food journal and adhering to recommended goals of 1000-1200 calories and 85 grams of protein and states she is following her eating plan approximately 90 % of the time. She states she is not exercising 0 minutes 0 times per week.  Kathleen [name] is an 83 year old female with obesity who presents for follow-up of her obesity treatment plan.  She has been on Mounjaro for ~22 weeks, currently at a dose of 5 mg weekly. Since starting [facility] on March 19, 2023, she has lost 13 pounds, reducing her weight from 237 pounds to 224 pounds. She feels better overall, except for recent abdominal discomfort.  She experiences significant right upper quadrant abdominal pain after consuming fatty foods, such as sesame chicken and bacon, describing it as 'like trying to digest rocks.' The pain is localized to the gallbladder region and has been persistent since recent episodes. No fever, chills, or diffuse abdominal pain, but some bloating and discomfort with certain vegetables.  She is concerned about her gallbladder function, especially given her family history of gallbladder cancer/bile duct cancer. She personally also has a history of colon cancer and breast cancer.   She monitors her blood sugar daily, with recent readings ranging from 96 to 127 mg/dL, and her insulin levels have improved since starting Mounjaro. Her past medical history includes type 2 diabetes, hyperlipidemia, vitamin D deficiency, and hypertension. She is currently taking bisoprolol and has requested a  refill.  She reports dietary adjustments due to her symptoms, avoiding fatty foods and certain vegetables that cause bloating. She tolerates fruits like apples, mandarin oranges, and strawberries well and takes a fiber supplement.  OBJECTIVE: Visit Diagnoses: Problem List Items Addressed This Visit     Hypertension associated with type 2 diabetes mellitus (HCC)   Relevant Medications   bisoprolol (ZEBETA) 5 MG tablet   Vitamin D deficiency   Obesity (HCC)- starting BMI 44.46   Diabetes mellitus (HCC)   Other Visit Diagnoses       Gall bladder pain?/ RUQ pain    -  Primary     BMI 38.0-38.9,adult Current BMI 38.7         Gallbladder dysfunction ? Abdominal pain localized to the gallbladder region, exacerbated by fatty meals, suggests gallbladder dysfunction.  Gallbladder ejection fraction of 47% from a previous HIDA scan-normal exam in Jan 2023.  CT ABD/pelvis 08/08/21: IMPRESSION: 1. No acute abnormality or explanation for abdominal pain. 2. Colonic diverticulosis without diverticulitis. There is mild distal ileal diverticulosis. 3. Moderate colonic stool burden, can be seen with constipation. 4. Mild hepatic steatosis. Small hepatic cysts.  Aortic Atherosclerosis (ICD10-I70.0). . We discussed that Kathleen [name] may contribute to symptoms due to its potential to increase gallbladder issues. No fever, chills, or jaundice , other concerning symptoms for cholecystitis/obstruction at this time. No gallstones noted previously on scans in 2023.   Family history of gallbladder cancer raises concern, but current symptoms do not suggest malignancy.She does have a personal history of colon cancer/breast cancer and may need complete re-evaluation. Discussed that Kathleen [name] can increase the risk  of gallbladder problems. -Discussed follow up with Dr. Tiburcio Pea as soon as possible.  - Contact Dr. Johnathan Hausen office for further evaluation - Avoid fatty foods to minimize symptoms. She is going to continue  [facility] currently, but will monitor closely for any worsening or development of other concerning signs like fever, worsening pain or pain not associated with fatty food intake.   Type 2 Diabetes Mellitus On Mounjaro 5 mg weekly with significant weight loss, beneficial for diabetes management. Blood sugar levels well-controlled, with readings mostly in the 90s to 110s range. Mounjaro also beneficial for mild hepatic steatosis, as it may improve insulin sensitivity and aid in weight management. Discussed that Mounjaro helps insulin work more effectively, reducing stress on the pancreas. Lab Results  Component Value Date   HGBA1C 5.9 (H) 06/24/2023   HGBA1C 5.9 (H) 01/26/2023   HGBA1C 5.9 (H) 03/20/2022   Lab Results  Component Value Date   LDLCALC 69 06/24/2023   CREATININE 0.93 06/24/2023   INSULIN  Date Value Ref Range Status  06/24/2023 20.2 2.6 - 24.9 uIU/mL Final  ] - Continue Mounjaro 5 mg weekly. No refills needed this visit - Monitor blood sugar levels regularly She is working  on nutrition plan to decrease simple carbohydrates, increase lean proteins and exercise to promote weight loss and improve glycemic control .   Obesity Lost 13 pounds since starting [facility] and a total of approximately 60 pounds overall from peak weight. Weight loss beneficial for overall health, including diabetes management and potential improvement in hepatic steatosis. - Continue current weight management plan - Monitor weight regularly  Hypertension Requires a refill of bisoprolol for hypertension management.  BP Readings from Last 3 Encounters:  08/19/23 138/74  07/22/23 (!) 149/76  06/24/23 126/73   On bisoprolol 5 mg daily Chlorthalidone 50 mg daily Valsartan 80 mg BID No side effects. Renal stable.  Lab Results  Component Value Date   NA 138 06/24/2023   CL 100 06/24/2023   K 4.3 06/24/2023   CO2 26 06/24/2023   BUN 30 (H) 06/24/2023   CREATININE 0.93 06/24/2023   EGFR 62  06/24/2023   CALCIUM 9.7 06/24/2023   ALBUMIN 4.4 06/24/2023   GLUCOSE 101 (H) 06/24/2023    Blood pressure control essential for overall cardiovascular health. - Refill/continue bisoprolol 5 mg daily prescription Continue to work on nutrition plan to promote weight loss and improve BP control.    Vitamin D deficiency Previously on vitamin D supplementation, which has been discontinued due to supratherapeutic levels.  Last vitamin D Lab Results  Component Value Date   VD25OH 83.3 06/24/2023    Monitoring of vitamin D levels periodically.  Low vitamin D levels can be associated with adiposity and may result in leptin resistance and weight gain. Also associated with fatigue.  Currently on vitamin D supplementation without any adverse effects such as nausea, vomiting or muscle weakness.     Follow-up Follow-up in four weeks for further evaluation and management of her conditions. - Schedule follow-up appointment for April 2nd at 10:30 AM  Vitals Temp: (!) 97.5 F (36.4 C) BP: 138/74 Pulse Rate: (!) 53 SpO2: 96 %   Anthropometric Measurements Height: 5\' 4"  (1.626 m) Weight: 225 lb (102.1 kg) BMI (Calculated): 38.6 Weight at Last Visit: 226lb Weight Lost Since Last Visit: 1lb Weight Gained Since Last Visit: 0 Starting Weight: 259lb Total Weight Loss (lbs): 34 lb (15.4 kg) Peak Weight: 283lb   Body Composition  Body Fat %: 49.6 % Fat Mass (lbs):  111.8 lbs Muscle Mass (lbs): 107.8 lbs Total Body Water (lbs): 80.2 lbs Visceral Fat Rating : 18   Other Clinical Data Fasting: yes Labs: no Today's Visit #: 75 Starting Date: 04/12/18     ASSESSMENT AND PLAN:  Diet: Kathleen [name] is currently in the action stage of change. As such, her goal is to continue with weight loss efforts and has agreed to keeping a food journal and adhering to recommended goals of 1000-1200 calories and 85 grams of protein.   Exercise:  Older adults should follow the adult guidelines.  When older adults cannot meet the adult guidelines, they should be as physically active as their abilities and conditions will allow., Older adults should do exercises that maintain or improve balance if they are at risk of falling. , and Older adults should determine their level of effort for physical activity relative to their level of fitness.  Behavior Modification:  We discussed the following Behavioral Modification Strategies today: increasing lean protein intake, decreasing simple carbohydrates, increasing vegetables, increase H2O intake, increase high fiber foods, avoiding temptations, planning for success, and avoid fatty foods . We discussed various medication options to help Kathleen [name] with her weight loss efforts and we both agreed to continue current treatment plan.  Return in about 4 weeks (around 09/16/2023).Marland Kitchen She was informed of the importance of frequent follow up visits to maximize her success with intensive lifestyle modifications for her multiple health conditions.  Attestation Statements:   Reviewed by clinician on day of visit: allergies, medications, problem list, medical history, surgical history, family history, social history, and previous encounter notes.   Time spent on visit including pre-visit chart review and post-visit care and charting was 48 minutes  Natan Hartog,PA-C

## 2023-09-15 NOTE — Progress Notes (Unsigned)
 SUBJECTIVE: Discussed the use of AI scribe software for clinical note transcription with the patient, who gave verbal consent to proceed.  Chief Complaint: Obesity  Interim History: She is down 1 lb since last visit Down 35 lbs since starting with [facility]- TBW loss of 13.5% Down 59 lbs from peak weight- TBW loss of 20.8% from peak weight   Kathleen [name] is here to discuss her progress with her obesity treatment plan. She is on the keeping a food journal and adhering to recommended goals of 1000 calories and 50-75 protein and states she is following her eating plan approximately 75 % of the time. She states she is exercising with daily activities  30-60 minutes 5 times per week.  Kathleen [name] is an 83 year old female who presents for follow-up of her obesity treatment plan.  She has lost one pound since her last visit and is currently adhering to her category three plan 75% of the time. She engages in biking three times weekly for 30 minutes. Her weight has decreased from 283 pounds in October to 224 pounds currently, marking a total weight loss of nearly 60 pounds. She has a history of significant weight fluctuations, having previously lost 30 pounds with [employer] before regaining some weight.  She has experienced busy periods recently, including hosting her grandchildren and a beach trip, which impacted her ability to track her diet. She has eliminated fried foods, cabbage, and collards from her diet due to digestive issues, opting for asparagus and small amounts of salad instead. Her stomach pain has improved after these dietary changes.  She has difficulty finding shirts due to lymphedema in her arm, which requires larger sleeves. The lymphedema is localized and does not extend into her hand. She has not used compression sleeves as it has not been severe enough to warrant them.  She is actively involved in community activities, including making desserts for a community lunch and working  at [employer]. Her involvement has increased due to a colleague's surgery, leading to long hours on her feet without meals or hydration. She occasionally consumes protein shakes to meet her nutritional needs.  She has been on [facility] for six months, which she believes has contributed to her weight loss. Her A1c has decreased from 6.8% in 2019 to 5.9%, indicating improved glycemic control. She is currently on bisoprolol, which she obtains from a local pharmacy.  OBJECTIVE: Visit Diagnoses: Problem List Items Addressed This Visit     Hypertension associated with type 2 diabetes mellitus (HCC)   Relevant Medications   tirzepatide (MOUNJARO) 5 MG/0.5ML Pen   Vitamin D deficiency   Obesity (HCC)- starting BMI 44.46   Relevant Medications   tirzepatide (MOUNJARO) 5 MG/0.5ML Pen   Diabetes mellitus (HCC) - Primary   Relevant Medications   tirzepatide (MOUNJARO) 5 MG/0.5ML Pen  Obesity She is on a category three obesity treatment plan and has lost one pound since the last visit. She adheres to the plan 75% of the time and engages in physical activity by biking three times weekly for 30 minutes. The gradual weight loss is preferred to prevent muscle loss, and she has built muscle mass, indicating a positive response to the treatment. She is currently at 224 pounds, down from 283 pounds, showing significant overall weight loss. [name] has contributed to her weight loss and potentially prevented the need for insulin therapy, effectively managing her condition. - Continue current obesity treatment plan. - Encourage adherence to the plan and regular physical  activity. - Monitor weight and muscle mass regularly. - Continue Mounjaro therapy.  Type 2 Diabetes Mellitus Her A1c is currently 5.9, in the prediabetic range, but she qualifies for type 2 diabetes medication. Her A1c improved from a previous high of 6.8 in 2019. [name] has contributed to weight loss and potentially prevented the need for  insulin therapy. The decision to continue [name] is based on its effectiveness in maintaining her A1c and weight loss, preventing further pancreatic strain, and avoiding insulin therapy. - Continue Mounjaro therapy. - Plan for lab work in June to monitor A1c and other relevant parameters.  Lymphedema She has lymphedema in her arm, causing difficulty in finding shirts with appropriate sleeve sizes. The condition is not severe enough to require a compression sleeve and does not extend into her hand. She prefers not to pursue surgical options due to her age. - Advise on finding clothing with loose sleeves to accommodate lymphedema.  Dietary Intolerance She reports dietary intolerances to fried foods, cabbage, and collards, which cause digestive issues. She has successfully eliminated these from her diet and substituted with asparagus and small amounts of salad, which do not cause stomach pain. - Continue to avoid foods that cause digestive issues. - Encourage dietary substitutions that do not cause discomfort.  Vitals Temp: 97.6 F (36.4 C) BP: -- (unable to check 2nd b/p.  lymph glands removed on opposite arm) Pulse Rate: (!) 56 SpO2: 99 %   Anthropometric Measurements Height: 5\' 4"  (1.626 m) Weight: 224 lb (101.6 kg) BMI (Calculated): 38.43 Weight at Last Visit: 225 lb Weight Lost Since Last Visit: 1 lb Weight Gained Since Last Visit: 0 Starting Weight: 259 lb Total Weight Loss (lbs): 35 lb (15.9 kg) Peak Weight: 283 lb   Body Composition  Body Fat %: 49.3 % Fat Mass (lbs): 110.8 lbs Muscle Mass (lbs): 108.2 lbs Total Body Water (lbs): 80.4 lbs Visceral Fat Rating : 18   Other Clinical Data Fasting: yes Labs: no Today's Visit #: 64 Starting Date: 04/12/18     ASSESSMENT AND PLAN:  Diet: Kathleen [name] is currently in the action stage of change. As such, her goal is to continue with weight loss efforts. She has agreed to keeping a food journal and adhering to  recommended goals of 1000-1100 calories and 75 grams of protein.  Exercise: Kathleen [name] has been instructed to try a geriatric exercise plan and that some exercise is better than none for weight loss and overall health benefits.   Behavior Modification:  We discussed the following Behavioral Modification Strategies today: increasing lean protein intake, decreasing simple carbohydrates, increasing vegetables, increase H2O intake, increase high fiber foods, avoiding temptations, and planning for success. We discussed various medication options to help Kathleen [name] with her weight loss efforts and we both agreed to continue Mounjaro 5 mg weekly for Type 2 diabetes and continue to work on nutritional and behavioral strategies to promote weight loss.  .  Return in about 4 weeks (around 10/14/2023).Marland Kitchen She was informed of the importance of frequent follow up visits to maximize her success with intensive lifestyle modifications for her multiple health conditions.  Attestation Statements:   Reviewed by clinician on day of visit: allergies, medications, problem list, medical history, surgical history, family history, social history, and previous encounter notes.   Time spent on visit including pre-visit chart review and post-visit care and charting was *** minutes.    Larson Limones, PA-C

## 2023-09-16 ENCOUNTER — Ambulatory Visit (INDEPENDENT_AMBULATORY_CARE_PROVIDER_SITE_OTHER): Admitting: Physician Assistant

## 2023-09-16 ENCOUNTER — Encounter (INDEPENDENT_AMBULATORY_CARE_PROVIDER_SITE_OTHER): Payer: Self-pay | Admitting: Physician Assistant

## 2023-09-16 VITALS — BP 149/64 | HR 56 | Temp 97.6°F | Ht 64.0 in | Wt 224.0 lb

## 2023-09-16 DIAGNOSIS — Z6838 Body mass index (BMI) 38.0-38.9, adult: Secondary | ICD-10-CM

## 2023-09-16 DIAGNOSIS — E559 Vitamin D deficiency, unspecified: Secondary | ICD-10-CM

## 2023-09-16 DIAGNOSIS — E1159 Type 2 diabetes mellitus with other circulatory complications: Secondary | ICD-10-CM | POA: Diagnosis not present

## 2023-09-16 DIAGNOSIS — Z7985 Long-term (current) use of injectable non-insulin antidiabetic drugs: Secondary | ICD-10-CM | POA: Diagnosis not present

## 2023-09-16 DIAGNOSIS — I152 Hypertension secondary to endocrine disorders: Secondary | ICD-10-CM

## 2023-09-16 DIAGNOSIS — E669 Obesity, unspecified: Secondary | ICD-10-CM | POA: Diagnosis not present

## 2023-09-16 MED ORDER — TIRZEPATIDE 5 MG/0.5ML ~~LOC~~ SOAJ: 5.0000 mg | SUBCUTANEOUS | 0 refills | Status: DC

## 2023-10-13 NOTE — Progress Notes (Unsigned)
 SUBJECTIVE: Discussed the use of AI scribe software for clinical note transcription with the patient, who gave verbal consent to proceed.  Chief Complaint: Obesity  Interim History: She is down 1 lb since last visit.  Down 36 lbs overall since starting with HWW TBW of 13.9% Down 60 lbs from peak weight TBW loss of 21.2% from peak weight  Kathleen [name]  is here to discuss her progress with her obesity treatment plan. She is on the keeping a food journal and adhering to recommended goals of 1200 calories and 85 protein and states she is following her eating plan approximately 50 % of the time. She states she is not exercising, but stays active and busy.   Kathleen [name] is an 83 year old female with obesity who presents for follow-up of her obesity treatment plan.  She has been on Mounjaro  5 mg weekly and has lost one pound since her last visit. She is trying a new approach to eating by not eating when not hungry, which has improved her digestive comfort. Her meals are smaller and lighter, and she is concerned about not getting enough protein. Despite this, she has gained a pound of muscle mass and lost 2.8 pounds of adipose since her last visit. Her visceral adipose rating has decreased from 19 to 17.  Her dietary habits include sometimes skipping dinner if not hungry and having a light snack later in the evening. Snacks include rice cakes with peanut butter, pretzels, and a glass of wine. She also consumes Clio bars and other protein-rich foods like, tuna, beef jerky, yogurts, and Core Power shakes. She aims to lose about 20 more pounds but feels comfortable with her current weight and reports sleeping well.  She continues to monitor her blood sugars, which have been stable, with recent readings ranging from 94 to 124 mg/dL. She maintains a record of her food intake but has stopped totaling calories and protein for a few days to see how it affects her.  She has a busy social calendar with  upcoming family gatherings and birthdays, where she plans to make mindful food choices.  She feels better now than she did ten years ago, despite having had breast cancer and back surgery in 2012, which led to significant weight gain during her recovery.  Plan fasting labs next OV  OBJECTIVE: Visit Diagnoses: Problem List Items Addressed This Visit     Hypertension associated with type 2 diabetes mellitus (HCC)   Relevant Medications   tirzepatide  (MOUNJARO ) 5 MG/0.5ML Pen   Obesity (HCC)- starting BMI 44.46   Relevant Medications   tirzepatide  (MOUNJARO ) 5 MG/0.5ML Pen   Diabetes mellitus (HCC) - Primary   Relevant Medications   tirzepatide  (MOUNJARO ) 5 MG/0.5ML Pen  Obesity Obesity management is ongoing with a focus on weight loss and muscle mass maintenance. Current weight loss is 36 pounds, with a total of 60 pounds lost from peak weight.  Muscle mass has increased by 1 pound, and visceral adipose rating has decreased from 19 to 17, with a goal of reaching 12.  She is following a strategy of eating only when hungry and focusing on protein intake.  She is satisfied with the current progress and aims to lose an additional 20 pounds to reach a weight below 200 pounds.  Lowering visceral adipose tissue is expected to reduce risks for cardiovascular disease, stroke, and certain cancers. - Continue Mounjaro  5 mg weekly - Encourage protein-focused diet - Encourage mindful eating and portion control - Reassess weight and  muscle mass at next visit  Type 2 diabetes mellitus Blood glucose levels are well-controlled with recent readings ranging from 94 to 124 mg/dL. She is monitoring blood sugars regularly and maintaining a protein-focused diet to support glucose control. On Mounjaro  5 mg weekly. Denies mass in neck, dysphagia, dyspepsia, persistent hoarseness, abdominal pain, or N/V/Constipation or diarrhea. Has annual eye exam. Mood is stable.   Lab Results  Component Value Date   HGBA1C  5.9 (H) 06/24/2023   HGBA1C 5.9 (H) 01/26/2023   HGBA1C 5.9 (H) 03/20/2022   Lab Results  Component Value Date   LDLCALC 69 06/24/2023   CREATININE 0.93 06/24/2023   INSULIN   Date Value Ref Range Status  06/24/2023 20.2 2.6 - 24.9 uIU/mL Final  ]She is working  on nutrition plan to decrease simple carbohydrates, increase lean proteins and exercise to promote weight loss and improve glycemic control . - Continue current diabetes management plan -Continue Mounjaro  5 mg weekly- refilled Recheck fasting labs next visit Meds ordered this encounter  Medications   tirzepatide  (MOUNJARO ) 5 MG/0.5ML Pen    Sig: Inject 5 mg into the skin once a week.    Dispense:  6 mL    Refill:  0     Hypertension Blood pressure is well-controlled at 125/79 mmHg. Current management appears effective.  Medications: Bisoprolol  5 mg daily    Chlorthalidone  50 mg daily    Valsartan  80 mg twice daily  No side effects with medications. Renal stable.  BP Readings from Last 3 Encounters:  10/14/23 125/79  09/16/23 (!) 149/64  08/19/23 138/74   Lab Results  Component Value Date   NA 138 06/24/2023   CL 100 06/24/2023   K 4.3 06/24/2023   CO2 26 06/24/2023   BUN 30 (H) 06/24/2023   CREATININE 0.93 06/24/2023   EGFR 62 06/24/2023   CALCIUM 9.7 06/24/2023   ALBUMIN 4.4 06/24/2023   GLUCOSE 101 (H) 06/24/2023   Continue medications as prescribed. Continue to work on nutrition plan to promote weight loss and improve BP control.  Monitor closely with continued weight loss on Mounjaro .  Recheck labs next visit  Hyperlipidemia LDL is not at goal. Medication(s): Simvastatin  40 mg daily Krill oil 300mg  daily. No side effects with medications. Cardiovascular risk factors: advanced age (older than 21 for men, 10 for women), diabetes mellitus, dyslipidemia, hypertension, obesity (BMI >= 30 kg/m2), and sedentary lifestyle  Lab Results  Component Value Date   CHOL 143 06/24/2023   HDL 60 06/24/2023    LDLCALC 69 06/24/2023   TRIG 72 06/24/2023   CHOLHDL 2.2 09/02/2021   CHOLHDL 2.5 03/11/2021   CHOLHDL 2.4 09/10/2020   Lab Results  Component Value Date   ALT 14 06/24/2023   AST 15 06/24/2023   ALKPHOS 67 06/24/2023   BILITOT 0.3 06/24/2023   The ASCVD Risk score (Arnett DK, et al., 2019) failed to calculate for the following reasons:   The 2019 ASCVD risk score is only valid for ages 64 to 10  Plan: Continue statin. Continue to work on nutrition plan -decreasing simple carbohydrates, increasing lean proteins, decreasing saturated fats and cholesterol , avoiding trans fats and exercise as able to promote weight loss, improve lipids and decrease cardiovascular risks. Continue Mounjaro  which should also decrease CV risks.  Recheck fasting labs next visit   Vitals Temp: 97.6 F (36.4 C) BP: 125/79 Pulse Rate: (!) 59 SpO2: 99 %   Anthropometric Measurements Height: 5\' 4"  (1.626 m) Weight: 223 lb (101.2 kg) BMI (  Calculated): 38.26 Weight at Last Visit: 224 b Weight Lost Since Last Visit: 1 lb Weight Gained Since Last Visit: 0 Starting Weight: 259 lb Total Weight Loss (lbs): 36 lb (16.3 kg) Peak Weight: 283 lb   Body Composition  Body Fat %: 48.4 % Fat Mass (lbs): 108 lbs Muscle Mass (lbs): 109.2 lbs Total Body Water (lbs): 76.6 lbs Visceral Fat Rating : 17   Other Clinical Data Fasting: yes Labs: no Today's Visit #: 90 Starting Date: 04/12/18     ASSESSMENT AND PLAN:  Diet: Kathleen [name]  is currently in the action stage of change. As such, her goal is to continue with weight loss efforts. She has agreed to keeping a food journal and adhering to recommended goals of 1200 calories and 85 grams of protein.  Exercise: Kathleen [name]  has been instructed to try a geriatric exercise plan and that some exercise is better than none for weight loss and overall health benefits.   Behavior Modification:  We discussed the following Behavioral Modification Strategies today:  increasing lean protein intake, decreasing simple carbohydrates, increasing vegetables, increase H2O intake, no skipping meals, celebration eating strategies, avoiding temptations, and planning for success. We discussed various medication options to help Kathleen [name]  with her weight loss efforts and we both agreed to continue current treatment plan and continue to work on nutritional and behavioral strategies to promote weight loss.  .  Return in about 5 weeks (around 11/18/2023) for Fasting Lab.Aaron Aas She was informed of the importance of frequent follow up visits to maximize her success with intensive lifestyle modifications for her multiple health conditions.  Attestation Statements:   Reviewed by clinician on day of visit: allergies, medications, problem list, medical history, surgical history, family history, social history, and previous encounter notes.   Time spent on visit including pre-visit chart review and post-visit care and charting was 35 minutes.    Herold Salguero, PA-C

## 2023-10-14 ENCOUNTER — Ambulatory Visit (INDEPENDENT_AMBULATORY_CARE_PROVIDER_SITE_OTHER): Admitting: Physician Assistant

## 2023-10-14 ENCOUNTER — Encounter (INDEPENDENT_AMBULATORY_CARE_PROVIDER_SITE_OTHER): Payer: Self-pay | Admitting: Physician Assistant

## 2023-10-14 VITALS — BP 125/79 | HR 59 | Temp 97.6°F | Ht 64.0 in | Wt 223.0 lb

## 2023-10-14 DIAGNOSIS — E785 Hyperlipidemia, unspecified: Secondary | ICD-10-CM

## 2023-10-14 DIAGNOSIS — Z7985 Long-term (current) use of injectable non-insulin antidiabetic drugs: Secondary | ICD-10-CM | POA: Diagnosis not present

## 2023-10-14 DIAGNOSIS — E1159 Type 2 diabetes mellitus with other circulatory complications: Secondary | ICD-10-CM | POA: Diagnosis not present

## 2023-10-14 DIAGNOSIS — I152 Hypertension secondary to endocrine disorders: Secondary | ICD-10-CM

## 2023-10-14 DIAGNOSIS — E669 Obesity, unspecified: Secondary | ICD-10-CM | POA: Diagnosis not present

## 2023-10-14 DIAGNOSIS — Z6838 Body mass index (BMI) 38.0-38.9, adult: Secondary | ICD-10-CM

## 2023-10-14 DIAGNOSIS — E1169 Type 2 diabetes mellitus with other specified complication: Secondary | ICD-10-CM

## 2023-10-14 MED ORDER — TIRZEPATIDE 5 MG/0.5ML ~~LOC~~ SOAJ: 5.0000 mg | SUBCUTANEOUS | 0 refills | Status: DC

## 2023-11-17 NOTE — Progress Notes (Unsigned)
 SUBJECTIVE: Discussed the use of AI scribe software for clinical note transcription with the patient, who gave verbal consent to proceed.  Chief Complaint: Obesity  Interim History: She is down 3 lbs since last visit. Down 39 lbs since starting with HWW TBW loss of 15.1%  Kathleen [name]  is here to discuss her progress with her obesity treatment plan. She is on the Journaling plan 1200 calories 85 protein Daily and states she is following her eating plan approximately 75 % of the time. She states she is not exercising.  Kathleen  B [name] is an 84 year old female with obesity who presents for follow-up of her obesity treatment plan.  Reports she has been experiencing episodes of severe abdominal pain and diarrhea, suspecting lactose intolerance as the cause. Significant discomfort and acid reflux occur after consuming ice cream. Her diet heavily relies on dairy products such as yogurt and cottage cheese, which may contribute to her symptoms. We discussed non dairy options for yogurts and trying to avoid things like ice cream for now. She does have a history of Colon Ca and is due a colonoscopy later this year. We discussed getting back into see GI now rather than waiting.   She has type 2 diabetes and is on Mounjaro  5 mg weekly for management and weight loss. She has lost 63 pounds from her peak weight, attributing 39 pounds to the current treatment plan with HWW. Her blood sugar levels are mostly stable, with occasional spikes, such as a reading of 132 mg/dL on May 1st, associated with dietary choices like ice cream.  She takes multiple medications for hypertension and hyperlipidemia, including valsartan  80 mg twice daily, chlorthalidone  50 mg daily, simvastatin  40 mg daily, and bisoprolol  5 mg daily. She has not seen a cardiologist despite being on three blood pressure medications and plans to discuss with her PCP to see if this is warrented.  She has a history of colon cancer and is scheduled for a  follow-up colonoscopy at the end of the year. She had a colonoscopy in December 2022 and a gallbladder scan in early 2023, which showed a low-functioning gallbladder with an ejection fraction of 47%.  She experiences occasional low energy levels and is taking a B complex vitamin.She is actively involved in family activities and social events, including volunteering at her [employer] and planning family trips. She is satisfied with her weight loss journey and the ability to maintain her current lifestyle.   Fasting labs obtained today.  The patient was informed we would discuss the lab results at the next visit unless there is a critical issue that needs to be addressed sooner. The patient agreed to keep the next visit at the agreed upon time to discuss these results.   OBJECTIVE: Visit Diagnoses: Problem List Items Addressed This Visit     Hypertension associated with type 2 diabetes mellitus (HCC)   Relevant Medications   bisoprolol  (ZEBETA ) 5 MG tablet   chlorthalidone  (HYGROTON ) 50 MG tablet   valsartan  (DIOVAN ) 80 MG tablet   simvastatin  (ZOCOR ) 40 MG tablet   Other Relevant Orders   CMP14+EGFR   Hyperlipidemia associated with type 2 diabetes mellitus (HCC)   Relevant Medications   bisoprolol  (ZEBETA ) 5 MG tablet   chlorthalidone  (HYGROTON ) 50 MG tablet   valsartan  (DIOVAN ) 80 MG tablet   simvastatin  (ZOCOR ) 40 MG tablet   Other Relevant Orders   CMP14+EGFR   Lipid Panel With LDL/HDL Ratio   Vitamin D  deficiency   Relevant Orders  VITAMIN D  25 Hydroxy (Vit-D Deficiency, Fractures)   Obesity (HCC)- starting BMI 44.46   Diabetes mellitus (HCC) - Primary   Relevant Medications   valsartan  (DIOVAN ) 80 MG tablet   simvastatin  (ZOCOR ) 40 MG tablet   Other Relevant Orders   CMP14+EGFR   Hemoglobin A1c   Insulin , random   Other Visit Diagnoses       Other fatigue       Relevant Orders   Vitamin B12   CBC with Differential/Platelet     Dyspepsia/GI complaints        Relevant Medications   famotidine -calcium carbonate-magnesium hydroxide (PEPCID  COMPLETE) 10-800-165 MG chewable tablet     BMI 37.0-37.9, adult Current BMI 37.9         Obesity Significant weight loss of 63 pounds including weight loss prior to starting with HWW, with a current BMI of 37.7.  Mounjaro  has been effective in promoting weight loss and improving diabetic control.  She desires to continue weight loss journey with a goal of getting under 200 pounds. Current weight loss is 15.1% of initial body weight. - Continue Mounjaro  5 mg weekly for weight management and diabetes control. - Encourage maintaining current dietary and exercise regimen to support weight loss goals.  GI complaints Experiences abdominal pain and diarrhea, ? due to lactose intolerance, exacerbated by ice cream consumption. Diet includes high dairy intake such as yogurt and cottage cheese. Considering lactose-free alternatives and reducing dairy intake. Discussed monitoring sugar content in lactose-free products to avoid added sugars. - Advise reducing intake of lactose-heavy foods such as ice cream and regular milk. - Recommend exploring lactose-free products such as Lactaid and So Delicious. - Suggest considering dairy-free yogurt alternatives and monitoring sugar content. - Encourage contacting gastroenterologist for further evaluation if symptoms persist.  She has a history of hepatobiliary scan in 06/2021 -gallbladder with an ejection fraction of 47%. We discussed that Mounjaro  may increase risk for gallbladder issues. Symptoms of abdominal pain and diarrhea could be related to gallbladder dysfunction, related to Mounjaro  directly or perhaps lactose intolerance .  Discussed the need for gastroenterology evaluation to determine if gallbladder issues are contributing to symptoms. - Recommend follow-up with gastroenterologist to evaluate gallbladder function and assess if it contributes to symptoms. Patient also has  history of colon cancer, though symptoms seem more food related- ? Lactose intolerance or GB related, but still need to keep this in mind.  She is taking Pepcid  complete at times for GERD like symptoms and would like to continue this.  - Order follow-up labs including liver function tests to assess for any abnormalities.  Type 2 Diabetes Mellitus Diabetes is well-controlled with recent A1c of 5.9%. Blood glucose levels are generally stable, with occasional elevations after dietary indiscretions. Mounjaro  has contributed to improved diabetic control through weight loss. Lab Results  Component Value Date   HGBA1C 5.9 (H) 06/24/2023   HGBA1C 5.9 (H) 01/26/2023   HGBA1C 5.9 (H) 03/20/2022   Lab Results  Component Value Date   LDLCALC 69 06/24/2023   CREATININE 0.93 06/24/2023    - Continue current diabetes management plan with Mounjaro  and monitor blood glucose levels. - Order follow-up labs to reassess A1c and overall diabetic control.  Hypertension Hypertension is managed with valsartan , chlorthalidone , and bisoprolol . BP Readings from Last 3 Encounters:  11/18/23 127/73  10/14/23 125/79  09/16/23 (!) 149/64   Lab Results  Component Value Date   NA 138 06/24/2023   CL 100 06/24/2023   K 4.3 06/24/2023  CO2 26 06/24/2023   BUN 30 (H) 06/24/2023   CREATININE 0.93 06/24/2023   EGFR 62 06/24/2023   CALCIUM 9.7 06/24/2023   ALBUMIN 4.4 06/24/2023   GLUCOSE 101 (H) 06/24/2023   Continue to work on nutrition plan to promote weight loss and improve BP control.  Continue /refill usual medications.  Meds ordered this encounter  Medications   bisoprolol  (ZEBETA ) 5 MG tablet    Sig: Take 1 tablet (5 mg total) by mouth daily.    Dispense:  90 tablet    Refill:  0   chlorthalidone  (HYGROTON ) 50 MG tablet    Sig: Take 1 tablet (50 mg total) by mouth daily.    Dispense:  90 tablet    Refill:  0   valsartan  (DIOVAN ) 80 MG tablet    Sig: Take 1 tablet (80 mg total) by mouth 2 (two)  times daily.    Dispense:  180 tablet    Refill:  0   simvastatin  (ZOCOR ) 40 MG tablet    Sig: Take 1 tablet (40 mg total) by mouth at bedtime.    Dispense:  90 tablet    Refill:  0   famotidine -calcium carbonate-magnesium hydroxide (PEPCID  COMPLETE) 10-800-165 MG chewable tablet    Sig: Chew 1 tablet by mouth 2 (two) times daily as needed.    Dispense:  60 tablet    Refill:  0    Hyperlipidemia LDL is not at goal. Medication(s): Simvastatin   Cardiovascular risk factors: advanced age (older than 38 for men, 100 for women), diabetes mellitus, dyslipidemia, hypertension, obesity (BMI >= 30 kg/m2), and sedentary lifestyle  Lab Results  Component Value Date   CHOL 143 06/24/2023   HDL 60 06/24/2023   LDLCALC 69 06/24/2023   TRIG 72 06/24/2023   CHOLHDL 2.2 09/02/2021   CHOLHDL 2.5 03/11/2021   CHOLHDL 2.4 09/10/2020   Lab Results  Component Value Date   ALT 14 06/24/2023   AST 15 06/24/2023   ALKPHOS 67 06/24/2023   BILITOT 0.3 06/24/2023   The ASCVD Risk score (Arnett DK, et al., 2019) failed to calculate for the following reasons:   The 2019 ASCVD risk score is only valid for ages 80 to 5  Plan: Continue statin. Continue Mounjaro .  Continue to work on nutrition plan -decreasing simple carbohydrates, increasing lean proteins, decreasing saturated fats and cholesterol , avoiding trans fats and exercise as able to promote weight loss, improve lipids and decrease cardiovascular risks. Recheck fasting lipids today.   Vitamin D  Deficiency Vitamin D  is at goal of 50.  Most recent vitamin D  level was 83.3- supra therapeutic. She is off vitamin D  currently. Lab Results  Component Value Date   VD25OH 83.3 06/24/2023   VD25OH 65.9 01/26/2023   VD25OH 57.2 03/20/2022    Plan:  Recheck vitamin D  level today and resume supplementation as indicated.  Low vitamin D  levels can be associated with adiposity and may result in leptin resistance and weight gain. Also associated with  fatigue.  Currently on vitamin D  supplementation without any adverse effects such as nausea, vomiting or muscle weakness.   Fatigue Endorses some fatigue at times. Plan: Recheck CBC, B12, vitamin D  and TSH today and treatment if indicated.   Goals of Care Focused on maintaining health and mobility, with a goal of continued weight loss and improved energy levels. Expresses satisfaction with current weight loss progress and aims to sustain it. Discussed the importance of feeling well and being able to engage in desired activities.  Follow-up Scheduled follow-up appointment on July 2nd. Plans to discuss potential cardiology referral in August appt with PCP.  Will contact gastroenterologist for evaluation of abdominal symptoms. - Attend follow-up appointment on July 2nd. - Discuss potential cardiology referral in August. - Contact gastroenterologist for evaluation of abdominal symptoms.  Vitals Temp: 98.1 F (36.7 C) BP: 127/73 Pulse Rate: (!) 57 SpO2: 99 %   Anthropometric Measurements Height: 5\' 4"  (1.626 m) Weight: 220 lb (99.8 kg) BMI (Calculated): 37.74 Weight at Last Visit: 223 lb Weight Lost Since Last Visit: 3 lb Weight Gained Since Last Visit: 0 Starting Weight: 259 lb Total Weight Loss (lbs): 39 lb (17.7 kg) Peak Weight: 283 lb   Body Composition  Body Fat %: 48.9 % Fat Mass (lbs): 108 lbs Muscle Mass (lbs): 107 lbs Total Body Water (lbs): 76.8 lbs Visceral Fat Rating : 17   Other Clinical Data Fasting: Yes Labs: Yes Today's Visit #: 91 Starting Date: 04/12/18     ASSESSMENT AND PLAN:  Diet: Lorenia  is currently in the action stage of change. As such, her goal is to continue with weight loss efforts. She has agreed to keeping a food journal and adhering to recommended goals of 1200 calories and 85+ grams of protein.  Exercise: Shamiracle  has been instructed to try a geriatric exercise plan and that some exercise is better than none for weight loss and  overall health benefits.   Behavior Modification:  We discussed the following Behavioral Modification Strategies today: increasing lean protein intake, decreasing simple carbohydrates, increasing vegetables, increase H2O intake, increase high fiber foods, no skipping meals, meal planning and cooking strategies, avoiding temptations, planning for success, and keep a strict food journal. We discussed various medication options to help Terryann  with her weight loss efforts and we both agreed to continue current treatment plan. .  Return in about 4 weeks (around 12/16/2023).Aaron Aas She was informed of the importance of frequent follow up visits to maximize her success with intensive lifestyle modifications for her multiple health conditions.  Attestation Statements:   Reviewed by clinician on day of visit: allergies, medications, problem list, medical history, surgical history, family history, social history, and previous encounter notes.   Time spent on visit including pre-visit chart review and post-visit care and charting was 44 minutes.    Bailee Thall, PA-C

## 2023-11-18 ENCOUNTER — Ambulatory Visit (INDEPENDENT_AMBULATORY_CARE_PROVIDER_SITE_OTHER): Admitting: Physician Assistant

## 2023-11-18 VITALS — BP 127/73 | HR 57 | Temp 98.1°F | Ht 64.0 in | Wt 220.0 lb

## 2023-11-18 DIAGNOSIS — R5383 Other fatigue: Secondary | ICD-10-CM

## 2023-11-18 DIAGNOSIS — E1169 Type 2 diabetes mellitus with other specified complication: Secondary | ICD-10-CM | POA: Diagnosis not present

## 2023-11-18 DIAGNOSIS — E1159 Type 2 diabetes mellitus with other circulatory complications: Secondary | ICD-10-CM | POA: Diagnosis not present

## 2023-11-18 DIAGNOSIS — R1013 Epigastric pain: Secondary | ICD-10-CM | POA: Diagnosis not present

## 2023-11-18 DIAGNOSIS — E669 Obesity, unspecified: Secondary | ICD-10-CM

## 2023-11-18 DIAGNOSIS — E785 Hyperlipidemia, unspecified: Secondary | ICD-10-CM

## 2023-11-18 DIAGNOSIS — E559 Vitamin D deficiency, unspecified: Secondary | ICD-10-CM | POA: Diagnosis not present

## 2023-11-18 DIAGNOSIS — Z7985 Long-term (current) use of injectable non-insulin antidiabetic drugs: Secondary | ICD-10-CM

## 2023-11-18 DIAGNOSIS — I152 Hypertension secondary to endocrine disorders: Secondary | ICD-10-CM

## 2023-11-18 DIAGNOSIS — Z6837 Body mass index (BMI) 37.0-37.9, adult: Secondary | ICD-10-CM

## 2023-11-18 MED ORDER — FAMOTIDINE-CA CARB-MAG HYDROX 10-800-165 MG PO CHEW: 1.0000 | CHEWABLE_TABLET | Freq: Two times a day (BID) | ORAL | 0 refills | Status: AC | PRN

## 2023-11-18 MED ORDER — VALSARTAN 80 MG PO TABS: 80.0000 mg | ORAL_TABLET | Freq: Two times a day (BID) | ORAL | 0 refills | Status: DC

## 2023-11-18 MED ORDER — CHLORTHALIDONE 50 MG PO TABS: 50.0000 mg | ORAL_TABLET | Freq: Every day | ORAL | 0 refills | Status: DC

## 2023-11-18 MED ORDER — BISOPROLOL FUMARATE 5 MG PO TABS
5.0000 mg | ORAL_TABLET | Freq: Every day | ORAL | 0 refills | Status: DC
Start: 2023-11-18 — End: 2024-02-10

## 2023-11-18 MED ORDER — SIMVASTATIN 40 MG PO TABS
40.0000 mg | ORAL_TABLET | Freq: Every day | ORAL | 0 refills | Status: DC
Start: 2023-11-18 — End: 2024-02-10

## 2023-11-20 LAB — VITAMIN D 25 HYDROXY (VIT D DEFICIENCY, FRACTURES): Vit D, 25-Hydroxy: 59.1 ng/mL (ref 30.0–100.0)

## 2023-11-20 LAB — CBC WITH DIFFERENTIAL/PLATELET
Basophils Absolute: 0 10*3/uL (ref 0.0–0.2)
Basos: 0 %
EOS (ABSOLUTE): 0.4 10*3/uL (ref 0.0–0.4)
Eos: 6 %
Hematocrit: 36 % (ref 34.0–46.6)
Hemoglobin: 11.7 g/dL (ref 11.1–15.9)
Immature Grans (Abs): 0 10*3/uL (ref 0.0–0.1)
Immature Granulocytes: 0 %
Lymphocytes Absolute: 1.5 10*3/uL (ref 0.7–3.1)
Lymphs: 19 %
MCH: 30.4 pg (ref 26.6–33.0)
MCHC: 32.5 g/dL (ref 31.5–35.7)
MCV: 94 fL (ref 79–97)
Monocytes Absolute: 0.6 10*3/uL (ref 0.1–0.9)
Monocytes: 7 %
Neutrophils Absolute: 5.3 10*3/uL (ref 1.4–7.0)
Neutrophils: 68 %
Platelets: 262 10*3/uL (ref 150–450)
RBC: 3.85 x10E6/uL (ref 3.77–5.28)
RDW: 12.5 % (ref 11.7–15.4)
WBC: 7.9 10*3/uL (ref 3.4–10.8)

## 2023-11-20 LAB — CMP14+EGFR
ALT: 14 IU/L (ref 0–32)
AST: 17 IU/L (ref 0–40)
Albumin: 4.5 g/dL (ref 3.7–4.7)
Alkaline Phosphatase: 69 IU/L (ref 44–121)
BUN/Creatinine Ratio: 25 (ref 12–28)
BUN: 32 mg/dL — ABNORMAL HIGH (ref 8–27)
Bilirubin Total: 0.2 mg/dL (ref 0.0–1.2)
CO2: 20 mmol/L (ref 20–29)
Calcium: 9.4 mg/dL (ref 8.7–10.3)
Chloride: 101 mmol/L (ref 96–106)
Creatinine, Ser: 1.3 mg/dL — ABNORMAL HIGH (ref 0.57–1.00)
Globulin, Total: 2.7 g/dL (ref 1.5–4.5)
Glucose: 92 mg/dL (ref 70–99)
Potassium: 3.9 mmol/L (ref 3.5–5.2)
Sodium: 138 mmol/L (ref 134–144)
Total Protein: 7.2 g/dL (ref 6.0–8.5)
eGFR: 41 mL/min/{1.73_m2} — ABNORMAL LOW (ref >59)

## 2023-11-20 LAB — LIPID PANEL WITH LDL/HDL RATIO
Cholesterol, Total: 136 mg/dL (ref 100–199)
HDL: 59 mg/dL (ref >39)
LDL Chol Calc (NIH): 60 mg/dL (ref 0–99)
LDL/HDL Ratio: 1 ratio (ref 0.0–3.2)
Triglycerides: 87 mg/dL (ref 0–149)
VLDL Cholesterol Cal: 17 mg/dL (ref 5–40)

## 2023-11-20 LAB — HEMOGLOBIN A1C
Est. average glucose Bld gHb Est-mCnc: 103 mg/dL
Hgb A1c MFr Bld: 5.2 % (ref 4.8–5.6)

## 2023-11-20 LAB — VITAMIN B12: Vitamin B-12: 923 pg/mL (ref 232–1245)

## 2023-11-20 LAB — INSULIN, RANDOM: INSULIN: 14.2 u[IU]/mL (ref 2.6–24.9)

## 2023-11-21 ENCOUNTER — Ambulatory Visit (INDEPENDENT_AMBULATORY_CARE_PROVIDER_SITE_OTHER): Payer: Self-pay | Admitting: Physician Assistant

## 2023-12-04 ENCOUNTER — Other Ambulatory Visit: Payer: Self-pay

## 2023-12-04 DIAGNOSIS — K219 Gastro-esophageal reflux disease without esophagitis: Secondary | ICD-10-CM | POA: Diagnosis not present

## 2023-12-04 DIAGNOSIS — R109 Unspecified abdominal pain: Secondary | ICD-10-CM | POA: Diagnosis not present

## 2023-12-04 DIAGNOSIS — E739 Lactose intolerance, unspecified: Secondary | ICD-10-CM | POA: Diagnosis not present

## 2023-12-04 DIAGNOSIS — R1011 Right upper quadrant pain: Secondary | ICD-10-CM | POA: Diagnosis not present

## 2023-12-07 ENCOUNTER — Ambulatory Visit
Admission: RE | Admit: 2023-12-07 | Discharge: 2023-12-07 | Disposition: A | Discharge status: Home / Self Care | Source: Ambulatory Visit

## 2023-12-07 DIAGNOSIS — R1011 Right upper quadrant pain: Secondary | ICD-10-CM | POA: Diagnosis not present

## 2023-12-07 DIAGNOSIS — K760 Fatty (change of) liver, not elsewhere classified: Secondary | ICD-10-CM | POA: Diagnosis not present

## 2023-12-07 DIAGNOSIS — K838 Other specified diseases of biliary tract: Secondary | ICD-10-CM | POA: Diagnosis not present

## 2023-12-07 DIAGNOSIS — K7689 Other specified diseases of liver: Secondary | ICD-10-CM | POA: Diagnosis not present

## 2023-12-15 NOTE — Progress Notes (Signed)
 SUBJECTIVE: Discussed the use of AI scribe software for clinical note transcription with the patient, who gave verbal consent to proceed.  Chief Complaint: Obesity  Interim History: She is up 3 lbs since last visit.  Down 36 lbs overall TBW loss of 13.9%  Down 60 lbs from peak weight TBW loss of 21.2%  Kathleen [name]  is here to discuss her progress with her obesity treatment plan. She is on the keeping a food journal and adhering to recommended goals of 1200 calories and 85 protein and states she is following her eating plan approximately 50 % of the time. She states she is not exercising 0 minutes 0 times per week.  Kathleen [name]  B Kathleen [name] is an 83 year old female with type two diabetes and obesity who presents for a follow-up on her obesity treatment plan.  She has been on Mounjaro  5 mg weekly for type two diabetes management. The medication has helped reduce cravings and ease daily life. She feels some dissatisfaction after meals, described as a need for something more, but not hunger. Recently, there has been slight weight gain, possibly due to dietary changes.  She has been experiencing abdominal pain and underwent an abdominal ultrasound, which showed a mildly dilated common bile duct but no choledocholithiasis. The ultrasound also revealed diffuse hepatic steatosis and small septated liver cysts, consistent with a previous CT scan. She reports no current gallbladder issues. Gas pains are relieved by belching. Avoiding dairy for almost two weeks has alleviated stomach pains and diarrhea.  She experienced a four-day episode of diarrhea, attributed to lactose intolerance after consuming ice cream. Since then, she has avoided dairy products and plans to try lactase supplements. She uses Fairlife milk and has purchased almond milk but has not yet tried it. She is cautious with high-fiber foods like quinoa and is reintroducing vegetables like zucchini and slaw in small amounts.  Recent lab work showed  normal liver function tests, including AST, ALT, and alkaline phosphatase. Her A1c improved to 5.2, and insulin  levels decreased from 20.2 to 14.2. Blood sugar readings are mostly in the 90s to 120s range, with a recent high of 134. She had elevated creatinine and BUN following the diarrhea episode. She discontinued chlorothalidone for a week and has resumed it at 50 mg.  Her family history includes a sister-in-law with nonalcoholic cirrhosis requiring a liver transplant. She is mindful of her protein intake, incorporating high-protein foods like eggs, shrimp, and pork tenderloin into her diet. She plans to monitor her dietary intake and medication use while on vacation with her family. OBJECTIVE: Visit Diagnoses: Problem List Items Addressed This Visit     Hypertension associated with type 2 diabetes mellitus (HCC)   Hyperlipidemia associated with type 2 diabetes mellitus (HCC)   Vitamin D  deficiency   Obesity (HCC)- starting BMI 44.46   Diabetes mellitus (HCC) - Primary   Other Visit Diagnoses       Elevated serum creatinine       Relevant Orders   Basic Metabolic Panel (BMET)     BMI 61.9-61.0,jilou Current BMI 38.3         Obesity She is on Mounjaro  5 mg weekly for obesity management. Weight loss has been achieved, but recent dietary changes may have led to slight weight gain. She reports decreased satiety post-meals, suggesting a potential need for a higher Mounjaro  dose. Mounjaro  aids in weight loss and benefits fatty liver disease. Continued use is advised, especially during her upcoming beach trip, to manage cravings and maintain  weight loss. - Continue Mounjaro  5 mg weekly - Reassess Mounjaro  dosage on July 30th - Monitor weight and dietary intake  Hepatic Steatosis Diffuse hepatic steatosis is present, but liver function tests (AST, ALT, alkaline phosphatase) are normal. Weight loss is crucial to prevent progression to liver dysfunction or cirrhosis. Mounjaro  is beneficial for  fatty liver due to its weight loss effects. FDA approval for Mounjaro  in fatty liver treatment is under study. - Continue weight management efforts - Monitor liver function tests regularly  Type 2 Diabetes Mellitus A1c has improved to 5.2, below the prediabetic range, with insulin  levels reduced from 30 to 14.2. Current management is effective, but further improvement in insulin  levels is desired. - Continue current diabetes management regimen - Monitor blood glucose levels regularly - Reassess insulin  levels and A1c at next visit  Elevated creatinine- ? Dehydration related Recent labs showed elevated creatinine and BUN with decreased GFR, likely due to dehydration from a diarrhea episode. Chlorothalidone was temporarily discontinued and has been resumed at the correct dosage. Dehydration is suspected as the cause of abnormal kidney function tests. - Recheck kidney function tests - Monitor hydration status - Continue chlorothalidone at correct dosage  Lactose Intolerance Symptoms consistent with lactose intolerance, including abdominal pain and diarrhea after dairy consumption, are reported. Symptom relief is achieved by avoiding dairy. Lactase supplements may help when consuming dairy. She is exploring lactose-free alternatives and considering lactase supplements. - Avoid dairy products - Consider lactase supplements when consuming dairy - Monitor for symptom recurrence  Vitals Temp: 98.2 F (36.8 C) BP: (!) 144/82 Pulse Rate: (!) 54 SpO2: 93 %   Anthropometric Measurements Height: 5' 4 (1.626 m) Weight: 223 lb (101.2 kg) BMI (Calculated): 38.26 Weight at Last Visit: 220lb Weight Lost Since Last Visit: 0lb Weight Gained Since Last Visit: 3lb Starting Weight: 259lb Total Weight Loss (lbs): 36 lb (16.3 kg) Peak Weight: 283lb   Body Composition  Body Fat %: 49.8 % Fat Mass (lbs): 111 lbs Muscle Mass (lbs): 106.4 lbs Total Body Water (lbs): 79.4 lbs Visceral Fat Rating :  18   Other Clinical Data Fasting: No Labs: No Today's Visit #: 92 Starting Date: 04/12/18     ASSESSMENT AND PLAN:  Diet: [name]  is currently in the action stage of change. As such, her goal is to continue with weight loss efforts. She has agreed to keeping a food journal and adhering to recommended goals of 1200 calories and 85-90 grams of protein.  Exercise: Genoveva  has been instructed to try a geriatric exercise plan and that some exercise is better than none for weight loss and overall health benefits.   Behavior Modification:  We discussed the following Behavioral Modification Strategies today: increasing lean protein intake, decreasing simple carbohydrates, increasing vegetables, increase H2O intake, increase high fiber foods, no skipping meals, meal planning and cooking strategies, avoiding temptations, planning for success, and keep a strict food journal. We discussed various medication options to help Teyonna  with her weight loss efforts and we both agreed to continue current treatment plan.  Return in about 4 weeks (around 01/13/2024).SABRA She was informed of the importance of frequent follow up visits to maximize her success with intensive lifestyle modifications for her multiple health conditions.  Attestation Statements:   Reviewed by clinician on day of visit: allergies, medications, problem list, medical history, surgical history, family history, social history, and previous encounter notes.   Time spent on visit including pre-visit chart review and post-visit care and charting was 35 minutes.  Jerad Dunlap, PA-C

## 2023-12-16 ENCOUNTER — Encounter (INDEPENDENT_AMBULATORY_CARE_PROVIDER_SITE_OTHER): Payer: Self-pay | Admitting: Physician Assistant

## 2023-12-16 ENCOUNTER — Ambulatory Visit (INDEPENDENT_AMBULATORY_CARE_PROVIDER_SITE_OTHER): Admitting: Physician Assistant

## 2023-12-16 VITALS — BP 144/82 | HR 54 | Temp 98.2°F | Ht 64.0 in | Wt 223.0 lb

## 2023-12-16 DIAGNOSIS — E559 Vitamin D deficiency, unspecified: Secondary | ICD-10-CM | POA: Diagnosis not present

## 2023-12-16 DIAGNOSIS — Z7985 Long-term (current) use of injectable non-insulin antidiabetic drugs: Secondary | ICD-10-CM | POA: Diagnosis not present

## 2023-12-16 DIAGNOSIS — E669 Obesity, unspecified: Secondary | ICD-10-CM

## 2023-12-16 DIAGNOSIS — Z6838 Body mass index (BMI) 38.0-38.9, adult: Secondary | ICD-10-CM

## 2023-12-16 DIAGNOSIS — E1159 Type 2 diabetes mellitus with other circulatory complications: Secondary | ICD-10-CM

## 2023-12-16 DIAGNOSIS — E1169 Type 2 diabetes mellitus with other specified complication: Secondary | ICD-10-CM | POA: Diagnosis not present

## 2023-12-16 DIAGNOSIS — R7989 Other specified abnormal findings of blood chemistry: Secondary | ICD-10-CM

## 2023-12-16 DIAGNOSIS — I152 Hypertension secondary to endocrine disorders: Secondary | ICD-10-CM

## 2023-12-16 DIAGNOSIS — E785 Hyperlipidemia, unspecified: Secondary | ICD-10-CM | POA: Diagnosis not present

## 2023-12-17 ENCOUNTER — Ambulatory Visit (INDEPENDENT_AMBULATORY_CARE_PROVIDER_SITE_OTHER): Payer: Self-pay | Admitting: Physician Assistant

## 2023-12-17 LAB — BASIC METABOLIC PANEL WITH GFR
BUN/Creatinine Ratio: 20 (ref 12–28)
BUN: 19 mg/dL (ref 8–27)
CO2: 22 mmol/L (ref 20–29)
Calcium: 9.5 mg/dL (ref 8.7–10.3)
Chloride: 99 mmol/L (ref 96–106)
Creatinine, Ser: 0.94 mg/dL (ref 0.57–1.00)
Glucose: 89 mg/dL (ref 70–99)
Potassium: 4.3 mmol/L (ref 3.5–5.2)
Sodium: 138 mmol/L (ref 134–144)
eGFR: 61 mL/min/{1.73_m2} (ref >59)

## 2023-12-28 ENCOUNTER — Other Ambulatory Visit: Payer: Self-pay | Admitting: Family Medicine

## 2023-12-28 DIAGNOSIS — Z1231 Encounter for screening mammogram for malignant neoplasm of breast: Secondary | ICD-10-CM

## 2023-12-31 DIAGNOSIS — H53143 Visual discomfort, bilateral: Secondary | ICD-10-CM | POA: Diagnosis not present

## 2023-12-31 DIAGNOSIS — E119 Type 2 diabetes mellitus without complications: Secondary | ICD-10-CM | POA: Diagnosis not present

## 2023-12-31 DIAGNOSIS — Z961 Presence of intraocular lens: Secondary | ICD-10-CM | POA: Diagnosis not present

## 2023-12-31 LAB — HM DIABETES EYE EXAM

## 2024-01-12 NOTE — Progress Notes (Unsigned)
 SUBJECTIVE: Discussed the use of AI scribe software for clinical note transcription with the patient, who gave verbal consent to proceed.  Chief Complaint: Obesity  Interim History: She has maintained her weight since last visit.  Down 36 lbs overall !! TBW loss of ~14%  !!  Down 60 lbs from peak weight !!!! TBW loss of 21.2% !!!!  Auriel  is here to discuss her progress with her obesity treatment plan. She is on the keeping a food journal and adhering to recommended goals of 1200 calories and 80 protein and states she is following her eating plan approximately 50 % of the time. She states she is not exercising.  Violeta  B Wegner is an 83 year old female with obesity who presents for follow-up of her obesity treatment plan.  She has been on Mounjaro  5 mg weekly for obesity management and experiences intermittent gastrointestinal upset, but this has improved significantly by taking lactaid with foods that contain lactose and limiting lactose containing foods. She is following up with GI .  Hunger and cravings are well controlled on current Mounjaro  dose.   She reports blood sugar levels such as 113, 118, and 126 mg/dL. Stress, particularly from recent family health issues may have caused some  blood sugar fluctuations, but she is overall well controlled.  She manages lactose intolerance by gradually reintroducing dairy products with lactase supplements. Lactaid cottage cheese and sour cream are helpful, and she uses lactaid tablets with regular yogurt.  She feels stressed due to her husband's recent hospitalization and upcoming surgery, as well as responsibilities at her [employer]. Despite the stress, she manages well and has improved her physical activity, such as walking on the beach without difficulty.  Her social history includes active involvement in her church and a supportive family network. She manages responsibilities for her older sister but receives help from her  brother, alleviating some stress.  OBJECTIVE: Visit Diagnoses: Problem List Items Addressed This Visit     Hypertension associated with type 2 diabetes mellitus (HCC)   Obesity (HCC)- starting BMI 44.46   Diabetes mellitus (HCC) - Primary   Other Visit Diagnoses       Lactose intolerance         Dyspepsia/GI complaints         Constipation, unspecified constipation type         BMI 38.0-38.9,adult Current BMI 38.3         Obesity She is on Mounjaro  5 mg weekly for primary indication of Type 2 diabetes as well as obesity management. Reports intermittent gastrointestinal upset but is otherwise tolerating the medication well. Experienced a decrease in adipose tissue and an increase in muscle mass, indicating a positive change in body composition. Reports improved physical activity tolerance, such as walking on the beach without difficulty, suggesting improved functional status. - Continue Mounjaro  5 mg weekly - Monitor for gastrointestinal side effects - Encourage continued physical activity  Type 2 diabetes mellitus  On Mounjaro  5 mg weekly. Denies mass in neck, dysphagia, dyspepsia, persistent hoarseness, abdominal pain, or N/V or diarrhea. Has annual eye exam. Mood is stable.  Diarrhea as improved with management of lactose- using lactaid and lactose free foods.  Some mild constipation symptoms since started lactaid/lactose free foods Blood glucose levels are well-controlled with readings mostly between 113 and 126 mg/dL. Occasional higher readings are attributed to stress rather than dietary indiscretion or infection. The current regimen appears effective in maintaining glycemic control.A1c now at goal.  Lab Results  Component Value Date   HGBA1C 5.2 11/18/2023   HGBA1C 5.9 (H) 06/24/2023   HGBA1C 5.9 (H) 01/26/2023   Lab Results  Component Value Date   LDLCALC 60 11/18/2023   CREATININE 0.94 12/16/2023    Continue Mounjaro  5 mg weekly- no refills needed this visit.  She is  working  on nutrition plan to decrease simple carbohydrates, increase lean proteins and exercise to promote weight loss and improve glycemic control . Plan to recheck labs at October visit.   Hypertension Hypertension asymptomatic, reasonably well controlled, and no significant medication side effects noted.  Medication(s): bisoprolol  5 mg daily   Chlorthalidone  50 mg daily   Valsartan  80 mg BID Renal function normal on recheck.   BP Readings from Last 3 Encounters:  01/13/24 134/74  12/16/23 (!) 144/82  11/18/23 127/73   Lab Results  Component Value Date   CREATININE 0.94 12/16/2023   CREATININE 1.30 (H) 11/18/2023   CREATININE 0.93 06/24/2023   No results found for: GFR  Plan: Continue all antihypertensives at current dosages. Continue to work on nutrition plan to promote weight loss and improve BP control.  Monitor for hypotension with continued weight loss and treatment with Mounjaro .    Lactose intolerance/Dyspepsia Managing lactose intolerance by using lactase supplements and lactose-free products. Reports gradual reintroduction of yogurt and other dairy products with lactase supplementation, which is well-tolerated. GI upset does not seem related to GLP use, but will continue to monitor closely.  She does have a hx of colon cancer as well, but issues seem to be resolving with lactose management. If any concerns, may need further work up per GI.  - Continue using lactase supplements with dairy intake - Continue using lactose-free products as needed -Follow up with GI as instructed.   Constipation Uses Miralax occasionally to manage constipation. Finds that daily use is excessive and prefers to use it every two to three days as needed. This approach is effective in managing symptoms without causing diarrhea. - Use Miralax as needed every two to three days - monitor closely on GLP medication  Vitals Temp: 98.1 F (36.7 C) BP: 134/74 Pulse Rate: (!) 59 SpO2: 100  %   Anthropometric Measurements Height: 5' 4 (1.626 m) Weight: 223 lb (101.2 kg) BMI (Calculated): 38.26 Weight at Last Visit: 223 lb Weight Lost Since Last Visit: 0 Weight Gained Since Last Visit: 0 Starting Weight: 259 lb Total Weight Loss (lbs): 36 lb (16.3 kg) Peak Weight: 283 lb   Body Composition  Body Fat %: 49.1 % Fat Mass (lbs): 109.4 lbs Muscle Mass (lbs): 107.8 lbs Total Body Water (lbs): 80.2 lbs Visceral Fat Rating : 17   Other Clinical Data Fasting: No Labs: No Today's Visit #: 93 Starting Date: 04/12/18     ASSESSMENT AND PLAN:  Diet: Eureka  is currently in the action stage of change. As such, her goal is to continue with weight loss efforts. She has agreed to keeping a food journal and adhering to recommended goals of 1200 calories and 80+ grams of  protein.  Exercise: Serinity  has been instructed to try a geriatric exercise plan and that some exercise is better than none for weight loss and overall health benefits.   Behavior Modification:  We discussed the following Behavioral Modification Strategies today: increasing lean protein intake, decreasing simple carbohydrates, increasing vegetables, increase H2O intake, increase high fiber foods, no skipping meals, meal planning and cooking strategies, avoiding temptations, planning for success, and keep a strict food journal. We discussed various  medication options to help Carli  with her weight loss efforts and we both agreed to continue current treatment plan.  Return in about 4 weeks (around 02/10/2024).SABRA She was informed of the importance of frequent follow up visits to maximize her success with intensive lifestyle modifications for her multiple health conditions.  Attestation Statements:   Reviewed by clinician on day of visit: allergies, medications, problem list, medical history, surgical history, family history, social history, and previous encounter notes.   Time spent on visit including  pre-visit chart review and post-visit care and charting was 33 minutes.    Jerard Bays, PA-C

## 2024-01-13 ENCOUNTER — Encounter (INDEPENDENT_AMBULATORY_CARE_PROVIDER_SITE_OTHER): Payer: Self-pay | Admitting: Physician Assistant

## 2024-01-13 ENCOUNTER — Ambulatory Visit (INDEPENDENT_AMBULATORY_CARE_PROVIDER_SITE_OTHER): Admitting: Physician Assistant

## 2024-01-13 VITALS — BP 134/74 | HR 59 | Temp 98.1°F | Ht 64.0 in | Wt 223.0 lb

## 2024-01-13 DIAGNOSIS — R1013 Epigastric pain: Secondary | ICD-10-CM

## 2024-01-13 DIAGNOSIS — Z7985 Long-term (current) use of injectable non-insulin antidiabetic drugs: Secondary | ICD-10-CM | POA: Diagnosis not present

## 2024-01-13 DIAGNOSIS — E739 Lactose intolerance, unspecified: Secondary | ICD-10-CM | POA: Diagnosis not present

## 2024-01-13 DIAGNOSIS — E1169 Type 2 diabetes mellitus with other specified complication: Secondary | ICD-10-CM

## 2024-01-13 DIAGNOSIS — E559 Vitamin D deficiency, unspecified: Secondary | ICD-10-CM

## 2024-01-13 DIAGNOSIS — R7989 Other specified abnormal findings of blood chemistry: Secondary | ICD-10-CM

## 2024-01-13 DIAGNOSIS — E1159 Type 2 diabetes mellitus with other circulatory complications: Secondary | ICD-10-CM

## 2024-01-13 DIAGNOSIS — E669 Obesity, unspecified: Secondary | ICD-10-CM

## 2024-01-13 DIAGNOSIS — K5900 Constipation, unspecified: Secondary | ICD-10-CM

## 2024-01-13 DIAGNOSIS — Z6838 Body mass index (BMI) 38.0-38.9, adult: Secondary | ICD-10-CM

## 2024-01-13 DIAGNOSIS — I152 Hypertension secondary to endocrine disorders: Secondary | ICD-10-CM | POA: Diagnosis not present

## 2024-01-14 DIAGNOSIS — G4733 Obstructive sleep apnea (adult) (pediatric): Secondary | ICD-10-CM | POA: Diagnosis not present

## 2024-02-10 ENCOUNTER — Ambulatory Visit
Admission: RE | Admit: 2024-02-10 | Discharge: 2024-02-10 | Disposition: A | Discharge status: Home / Self Care | Source: Ambulatory Visit | Attending: Family Medicine | Admitting: Family Medicine

## 2024-02-10 ENCOUNTER — Ambulatory Visit (INDEPENDENT_AMBULATORY_CARE_PROVIDER_SITE_OTHER): Admitting: Physician Assistant

## 2024-02-10 ENCOUNTER — Encounter (INDEPENDENT_AMBULATORY_CARE_PROVIDER_SITE_OTHER): Payer: Self-pay | Admitting: Physician Assistant

## 2024-02-10 VITALS — BP 115/76 | HR 62 | Temp 97.7°F | Ht 64.0 in | Wt 224.0 lb

## 2024-02-10 DIAGNOSIS — E1169 Type 2 diabetes mellitus with other specified complication: Secondary | ICD-10-CM

## 2024-02-10 DIAGNOSIS — E785 Hyperlipidemia, unspecified: Secondary | ICD-10-CM | POA: Diagnosis not present

## 2024-02-10 DIAGNOSIS — M25569 Pain in unspecified knee: Secondary | ICD-10-CM | POA: Diagnosis not present

## 2024-02-10 DIAGNOSIS — Z1231 Encounter for screening mammogram for malignant neoplasm of breast: Secondary | ICD-10-CM

## 2024-02-10 DIAGNOSIS — G4733 Obstructive sleep apnea (adult) (pediatric): Secondary | ICD-10-CM | POA: Diagnosis not present

## 2024-02-10 DIAGNOSIS — E739 Lactose intolerance, unspecified: Secondary | ICD-10-CM

## 2024-02-10 DIAGNOSIS — E559 Vitamin D deficiency, unspecified: Secondary | ICD-10-CM

## 2024-02-10 DIAGNOSIS — E669 Obesity, unspecified: Secondary | ICD-10-CM

## 2024-02-10 DIAGNOSIS — R1013 Epigastric pain: Secondary | ICD-10-CM

## 2024-02-10 DIAGNOSIS — Z7985 Long-term (current) use of injectable non-insulin antidiabetic drugs: Secondary | ICD-10-CM | POA: Diagnosis not present

## 2024-02-10 DIAGNOSIS — Z6838 Body mass index (BMI) 38.0-38.9, adult: Secondary | ICD-10-CM

## 2024-02-10 DIAGNOSIS — E1159 Type 2 diabetes mellitus with other circulatory complications: Secondary | ICD-10-CM | POA: Diagnosis not present

## 2024-02-10 DIAGNOSIS — I152 Hypertension secondary to endocrine disorders: Secondary | ICD-10-CM | POA: Diagnosis not present

## 2024-02-10 MED ORDER — BISOPROLOL FUMARATE 5 MG PO TABS: 5.0000 mg | ORAL_TABLET | Freq: Every day | ORAL | 0 refills | Status: DC

## 2024-02-10 MED ORDER — CHLORTHALIDONE 50 MG PO TABS: 50.0000 mg | ORAL_TABLET | Freq: Every day | ORAL | 0 refills | Status: DC

## 2024-02-10 MED ORDER — SIMVASTATIN 40 MG PO TABS: 40.0000 mg | ORAL_TABLET | Freq: Every day | ORAL | 0 refills | Status: DC

## 2024-02-10 MED ORDER — VALSARTAN 80 MG PO TABS: 80.0000 mg | ORAL_TABLET | Freq: Two times a day (BID) | ORAL | 0 refills | Status: DC

## 2024-02-10 NOTE — Progress Notes (Unsigned)
 SUBJECTIVE: Discussed the use of AI scribe software for clinical note transcription with the patient, who gave verbal consent to proceed.  Chief Complaint: Obesity  Interim History: She is up 1 lb since her last visit Down 35 lbs overall TBW loss of 13.5%  Down 59 lbs from peak weight TBW loss of 20.85%  Ronee  is here to discuss her progress with her obesity treatment plan. She is on the keeping a food journal and adhering to recommended goals of 1200 calories and 85 protein and states she is following her eating plan approximately 70 % of the time. She states she is not exercising 0 minutes 0 times per week but remains very busy with her family and grandchildren.  Johnette  B Tidd is an 83 year old female who presents for follow-up of her obesity treatment plan.  She has recently gained a pound, attributing this to less disciplined dietary habits, particularly during the summer when she ate out more frequently. Despite this, she has lost a significant amount of weight, from 283 pounds to 224 pounds. She is currently on Mounjaro  5 mg weekly for type 2 diabetes management and reports experiencing increased hunger and cravings, especially for salty and crunchy foods.  Her dietary habits include daily consumption of fruits and vegetables, but she struggles with protein intake due to suspected lactose intolerance. She uses lactase supplements but still experiences gastrointestinal discomfort after consuming dairy products. She has been trying alternative protein sources such as scrambled eggs and high-protein seed bread and uses almond milk, which she finds low in protein.  Her blood sugar levels have been stable, mostly ranging from 111 to 123 mg/dL, with a slight increase to 131 mg/dL after a stressful trip with her grandchildren. She is concerned about her adipose percentage, which is currently at 49%, and aims to reduce it further.  She reports knee pain that began after twisting her  knee while carrying items up stairs during a recent beach trip. She has been taking arthritis strength Tylenol  regularly for the past few weeks to manage the pain.  She experiences fatigue and attributes it to her busy lifestyle and inadequate rest. She has obstructive sleep apnea and uses a CPAP machine, but has been having issues with the mask fit, which affects her sleep quality.  She will be running out of her usual medications for HTN, lipids management and will not see her PCP until next month, so will refill today.   OBJECTIVE: Visit Diagnoses: Problem List Items Addressed This Visit       Cardiovascular and Mediastinum   Hypertension associated with type 2 diabetes mellitus (HCC)     Endocrine   Hyperlipidemia associated with type 2 diabetes mellitus (HCC)   Diabetes mellitus (HCC) - Primary     Other   Vitamin D  deficiency   Obesity (HCC)- starting BMI 44.46   Other Visit Diagnoses       Dyspepsia/GI complaints         Obesity Obesity management with Mounjaro  5 mg weekly. Weight gain of 1 pound attributed to less disciplined eating and increased eating out. Current weight is 224 pounds, down from a peak of 283 pounds. Increased hunger and cravings noted, possibly due to reduced effectiveness of Mounjaro  over time. - Continue Mounjaro  5 mg weekly - Consider increasing Mounjaro  dose if increased hunger and cravings persist into the holidays - Implement strategies for portion control when eating out, such as using takeout boxes and eating slowly  Type 2 diabetes mellitus  Type 2 diabetes managed with Mounjaro . Blood sugars are well controlled, with recent readings ranging from 111 to 131 mg/dL. Last A1c was 5.2%. - Continue current diabetes management with Mounjaro  - Monitor blood sugar levels regularly  Hypertension Hypertension managed with simvastatin , chlorthalidone , bisoprolol , and valsartan . - Refill simvastatin , chlorthalidone , bisoprolol , and valsartan   prescriptions  Hyperlipidemia Hyperlipidemia managed with simvastatin . - Refill simvastatin  prescription  Obstructive sleep apnea on CPAP Obstructive sleep apnea managed with CPAP. Issues with current CPAP mask size and equipment supplier noted. Sleep quality affected by mask leaks, causing dry eyes and mouth. Weight loss has improved sleep apnea from severe to moderate. - Contact CPAP equipment supplier to resolve mask size and equipment issues - Consider ordering CPAP supplies from alternative sources if issues persist  Lactose intolerance Lactose intolerance suspected and managed with lactase supplements. Symptoms persist with dairy consumption, indicating significant intolerance. - Continue using lactase supplements as needed - Avoid dairy products that cause symptoms  Knee pain Knee pain possibly related to recent activity. Twisted knee while carrying items. Over-the-counter options considered but not pursued due to potential allergies. - Consider referral to orthopedic specialist if knee pain persists - Discuss knee pain with primary care provider for potential interventions  Chronic back pain Chronic back pain exacerbated by prolonged standing and activity. Managed with arthritis strength Tylenol . - Continue arthritis strength Tylenol  as needed - Consider evaluation by a specialist if pain persists  Vitals Temp: 97.7 F (36.5 C) BP: 115/76 Pulse Rate: 62 SpO2: 98 %   Anthropometric Measurements Height: 5' 4 (1.626 m) Weight: 224 lb (101.6 kg) BMI (Calculated): 38.43 Weight at Last Visit: 223lb Weight Lost Since Last Visit: 0lb Weight Gained Since Last Visit: 1lb Starting Weight: 259lb Total Weight Loss (lbs): 35 lb (15.9 kg) Peak Weight: 283lb   Body Composition  Body Fat %: 49.9 % Fat Mass (lbs): 112.2 lbs Muscle Mass (lbs): 106.8 lbs Total Body Water (lbs): 80.6 lbs Visceral Fat Rating : 18   Other Clinical Data Fasting: No Labs: No Today's Visit #:  94 Starting Date: 04/12/18     ASSESSMENT AND PLAN:  Diet: Brynnley  is currently in the action stage of change. As such, her goal is to continue with weight loss efforts. She has agreed to keeping a food journal and adhering to recommended goals of 1200 calories and 85 grams of protein and lactose free.  Exercise: Morgann  has been instructed to try a geriatric exercise plan and that some exercise is better than none for weight loss and overall health benefits.   Behavior Modification:  We discussed the following Behavioral Modification Strategies today: increasing lean protein intake, decreasing simple carbohydrates, increasing vegetables, increase H2O intake, increase high fiber foods, no skipping meals, meal planning and cooking strategies, avoiding temptations, planning for success, and keep a strict food journal. We discussed various medication options to help Adayah  with her weight loss efforts and we both agreed to continue current treatment plan.  Follow up in 4 weeks.  She was informed of the importance of frequent follow up visits to maximize her success with intensive lifestyle modifications for her multiple health conditions.  Attestation Statements:   Reviewed by clinician on day of visit: allergies, medications, problem list, medical history, surgical history, family history, social history, and previous encounter notes.   Time spent on visit including pre-visit chart review and post-visit care and charting was 31 minutes.   Humphrey Guerreiro,PA-C

## 2024-02-11 DIAGNOSIS — Z0000 Encounter for general adult medical examination without abnormal findings: Secondary | ICD-10-CM | POA: Diagnosis not present

## 2024-02-11 DIAGNOSIS — E1169 Type 2 diabetes mellitus with other specified complication: Secondary | ICD-10-CM | POA: Diagnosis not present

## 2024-02-11 DIAGNOSIS — K219 Gastro-esophageal reflux disease without esophagitis: Secondary | ICD-10-CM | POA: Diagnosis not present

## 2024-02-11 DIAGNOSIS — I10 Essential (primary) hypertension: Secondary | ICD-10-CM | POA: Diagnosis not present

## 2024-02-11 DIAGNOSIS — E782 Mixed hyperlipidemia: Secondary | ICD-10-CM | POA: Diagnosis not present

## 2024-02-11 DIAGNOSIS — K5909 Other constipation: Secondary | ICD-10-CM | POA: Diagnosis not present

## 2024-03-15 NOTE — Progress Notes (Unsigned)
 SUBJECTIVE: Discussed the use of AI scribe software for clinical note transcription with the patient, who gave verbal consent to proceed.  Chief Complaint: Obesity  Interim History: She has maintained her weight since her last visit.  Down 35 lbs overall TBW loss of 13.5%   Down 59 lbs from peak weight TBW loss of 20.85% Kathleen [name]  is here to discuss her progress with her obesity treatment plan. She is on the keeping a food journal and adhering to recommended goals of 1200 calories and 85 grams of protein and states she is following her eating plan approximately 75 % of the time. She states she is not exercising, but remains very active with beach trips with her family and caring for her husband following his recent surgery.   Kathleen [name] is an 83 year old female who presents for follow-up of her obesity treatment plan.  Her current medications include Mounjaro , which she has been on for some time. She is also taking Lactaid with dairy products, which she reports is effective. Her blood sugar levels have been stable, with readings mostly between 106 and 140, except for one instance of 140 after a fatty meal. She is concerned about her insurance discontinuing coverage for her current glucose testing supplies and is considering alternatives.  Her husband recently underwent surgery, requiring her to prepare special meals for him, which has impacted her routine. She has been confined to her house more than usual due to his care needs. Despite these challenges, she has maintained her dietary tracking and sleep schedule.   She has not been consistently counting calories or meeting her protein needs since her husband's surgery, but she is documenting her food intake. She has not cooked meat recently but has been consuming tuna and deli [place].  Fasting labs obtained today.  The patient was informed we would discuss the lab results at the next visit unless there is a critical issue that needs to  be addressed sooner. The patient agreed to keep the next visit at the agreed upon time to discuss these results.   OBJECTIVE: Visit Diagnoses: Problem List Items Addressed This Visit     Hypertension associated with type 2 diabetes mellitus (HCC)   Relevant Medications   tirzepatide  (MOUNJARO ) 5 MG/0.5ML Pen   Hyperlipidemia associated with type 2 diabetes mellitus (HCC)   Relevant Medications   tirzepatide  (MOUNJARO ) 5 MG/0.5ML Pen   Other Relevant Orders   Lipid Panel With LDL/HDL Ratio (Completed)   Vitamin D  deficiency   Relevant Orders   VITAMIN D  25 Hydroxy (Vit-D Deficiency, Fractures) (Completed)   Obesity (HCC)- starting BMI 44.46   Relevant Medications   tirzepatide  (MOUNJARO ) 5 MG/0.5ML Pen   Diabetes mellitus (HCC) - Primary   Relevant Medications   tirzepatide  (MOUNJARO ) 5 MG/0.5ML Pen   Other Relevant Orders   CMP14+EGFR (Completed)   Hemoglobin A1c (Completed)   Insulin , random (Completed)   Other Visit Diagnoses       Dyspepsia/GI complaints       Relevant Medications   Lactase (LACTAID FAST ACT) 9000 units TABS   polyethylene glycol powder (MIRALAX) 17 GM/SCOOP powder   Other Relevant Orders   Vitamin B12 (Completed)      Obesity Weight is stable since her last visit. We discussed considering increasing Mounjaro  dosage but is concerned about appetite suppression and its impact on metabolism. - Continue current dietary tracking and protein intake. - Maintain Mounjaro  at 5 mg for the next 90 days. - Reassess weight and dietary  intake in one month.  Type 2 diabetes mellitus Blood glucose levels are well-controlled with readings ranging from 106 to 140 mg/dL. She is transitioning to an AccuCheck glucose monitoring system due to insurance changes. A1c was last recorded at 5.2%. - Order AccuCheck glucose meter and test strips. - Perform lab work today to reassess glucose control. She is working  on nutrition plan to decrease simple carbohydrates, increase  lean proteins and exercise to promote weight loss and improve glycemic control . Meds ordered this encounter  Medications   DISCONTD: Blood Glucose Monitoring Suppl DEVI    Sig: 1 each by Does not apply route in the morning, at noon, and at bedtime. May substitute to any manufacturer covered by patient's insurance.    Dispense:  1 each    Refill:  0    Please see orders for Accucheck and needs lancets and test strips to correspond to new meter   DISCONTD: Glucose Blood (BLOOD GLUCOSE TEST STRIPS) STRP    Sig: 1 each by Does not apply route in the morning, at noon, and at bedtime. May substitute to any manufacturer covered by patient's insurance.    Dispense:  100 strip    Refill:  0   DISCONTD: Lancet Device MISC    Sig: 1 each by Does not apply route in the morning, at noon, and at bedtime. May substitute to any manufacturer covered by patient's insurance.    Dispense:  1 each    Refill:  0   DISCONTD: Lancets Misc. MISC    Sig: 1 each by Does not apply route in the morning, at noon, and at bedtime. May substitute to any manufacturer covered by patient's insurance.    Dispense:  100 each    Refill:  0   tirzepatide  (MOUNJARO ) 5 MG/0.5ML Pen    Sig: Inject 5 mg into the skin once a week.    Dispense:  6 mL    Refill:  0    Hypertension Blood pressure is well-controlled. BP Readings from Last 3 Encounters:  03/16/24 135/78  02/10/24 115/76  01/13/24 134/74   Lab Results  Component Value Date   NA 139 03/16/2024   CL 100 03/16/2024   K 4.5 03/16/2024   CO2 22 03/16/2024   BUN 35 (H) 03/16/2024   CREATININE 1.01 (H) 03/16/2024   EGFR 56 (L) 03/16/2024   CALCIUM 9.6 03/16/2024   ALBUMIN 4.4 03/16/2024   GLUCOSE 90 03/16/2024   Continue to work on nutrition plan to promote weight loss and improve BP control.  Recheck labs this visit   Hyperlipidemia She is considering a coronary calcium score test to assess cardiovascular risk further. The test costs approximately $99,  involves a CT scan with contrast, and provides a score indicating coronary artery calcification and potential plaque disease. - Discuss coronary calcium score test with primary care provider for further cardiovascular risk assessment.  Dyspepsia and lactose intolerance Lactose intolerance is managed with Lactaid, which is effective. She takes Lactaid with any dairy consumption, including small amounts like butter on potatoes. - Continue using Lactaid with dairy intake.  General Health Maintenance She has not received flu and RSV vaccinations yet. She plans to obtain these vaccinations soon. - Obtain flu and RSV vaccinations at [facility]. No data recorded  No data recorded  No data recorded  No data recorded    ASSESSMENT AND PLAN:  Diet: Laquashia  is currently in the action stage of change. As such, her goal is to continue with weight  loss efforts. She has agreed to keeping a food journal and adhering to recommended goals of 1200 calories and 85+ grams of protein.  Exercise: Josalyn  has been instructed to try a geriatric exercise plan and that some exercise is better than none for weight loss and overall health benefits.   Behavior Modification:  We discussed the following Behavioral Modification Strategies today: increasing lean protein intake, decreasing simple carbohydrates, increasing vegetables, increase H2O intake, increase high fiber foods, no skipping meals, meal planning and cooking strategies, avoiding temptations, planning for success, and keep a strict food journal. We discussed various medication options to help Olanda  with her weight loss efforts and we both agreed to continue current treatment plan.  Follow up in 4-5  weeks. SABRA She was informed of the importance of frequent follow up visits to maximize her success with intensive lifestyle modifications for her multiple health conditions.  Attestation Statements:   Reviewed by clinician on day of visit: allergies,  medications, problem list, medical history, surgical history, family history, social history, and previous encounter notes.   Time spent on visit including pre-visit chart review and post-visit care and charting was 32 minutes.    Kathleen Gillingham, PA-C

## 2024-03-16 ENCOUNTER — Encounter (INDEPENDENT_AMBULATORY_CARE_PROVIDER_SITE_OTHER): Payer: Self-pay | Admitting: Physician Assistant

## 2024-03-16 ENCOUNTER — Ambulatory Visit (INDEPENDENT_AMBULATORY_CARE_PROVIDER_SITE_OTHER): Admitting: Physician Assistant

## 2024-03-16 VITALS — BP 135/78 | HR 57 | Temp 98.0°F | Ht 64.0 in | Wt 224.0 lb

## 2024-03-16 DIAGNOSIS — E739 Lactose intolerance, unspecified: Secondary | ICD-10-CM | POA: Diagnosis not present

## 2024-03-16 DIAGNOSIS — R1013 Epigastric pain: Secondary | ICD-10-CM | POA: Diagnosis not present

## 2024-03-16 DIAGNOSIS — I152 Hypertension secondary to endocrine disorders: Secondary | ICD-10-CM

## 2024-03-16 DIAGNOSIS — E559 Vitamin D deficiency, unspecified: Secondary | ICD-10-CM

## 2024-03-16 DIAGNOSIS — E1159 Type 2 diabetes mellitus with other circulatory complications: Secondary | ICD-10-CM | POA: Diagnosis not present

## 2024-03-16 DIAGNOSIS — Z7985 Long-term (current) use of injectable non-insulin antidiabetic drugs: Secondary | ICD-10-CM | POA: Diagnosis not present

## 2024-03-16 DIAGNOSIS — E785 Hyperlipidemia, unspecified: Secondary | ICD-10-CM | POA: Diagnosis not present

## 2024-03-16 DIAGNOSIS — E669 Obesity, unspecified: Secondary | ICD-10-CM

## 2024-03-16 DIAGNOSIS — E1169 Type 2 diabetes mellitus with other specified complication: Secondary | ICD-10-CM | POA: Diagnosis not present

## 2024-03-16 MED ORDER — LANCET DEVICE MISC: 1.0000 | Freq: Three times a day (TID) | 0 refills | Status: DC

## 2024-03-16 MED ORDER — BLOOD GLUCOSE TEST VI STRP: 1.0000 | ORAL_STRIP | Freq: Three times a day (TID) | 0 refills | Status: DC

## 2024-03-16 MED ORDER — BLOOD GLUCOSE MONITORING SUPPL DEVI: 1.0000 | Freq: Three times a day (TID) | 0 refills | Status: DC

## 2024-03-16 MED ORDER — TIRZEPATIDE 5 MG/0.5ML ~~LOC~~ SOAJ: 5.0000 mg | SUBCUTANEOUS | 0 refills | Status: DC

## 2024-03-16 MED ORDER — LANCETS MISC. MISC: 1.0000 | Freq: Three times a day (TID) | 0 refills | Status: DC

## 2024-03-17 LAB — CMP14+EGFR
ALT: 12 IU/L (ref 0–32)
AST: 16 IU/L (ref 0–40)
Albumin: 4.4 g/dL (ref 3.7–4.7)
Alkaline Phosphatase: 61 IU/L (ref 48–129)
BUN/Creatinine Ratio: 35 — ABNORMAL HIGH (ref 12–28)
BUN: 35 mg/dL — ABNORMAL HIGH (ref 8–27)
Bilirubin Total: 0.3 mg/dL (ref 0.0–1.2)
CO2: 22 mmol/L (ref 20–29)
Calcium: 9.6 mg/dL (ref 8.7–10.3)
Chloride: 100 mmol/L (ref 96–106)
Creatinine, Ser: 1.01 mg/dL — ABNORMAL HIGH (ref 0.57–1.00)
Globulin, Total: 2.7 g/dL (ref 1.5–4.5)
Glucose: 90 mg/dL (ref 70–99)
Potassium: 4.5 mmol/L (ref 3.5–5.2)
Sodium: 139 mmol/L (ref 134–144)
Total Protein: 7.1 g/dL (ref 6.0–8.5)
eGFR: 56 mL/min/1.73 — ABNORMAL LOW (ref >59)

## 2024-03-17 LAB — VITAMIN D 25 HYDROXY (VIT D DEFICIENCY, FRACTURES): Vit D, 25-Hydroxy: 46.2 ng/mL (ref 30.0–100.0)

## 2024-03-17 LAB — LIPID PANEL WITH LDL/HDL RATIO
Cholesterol, Total: 152 mg/dL (ref 100–199)
HDL: 62 mg/dL (ref >39)
LDL Chol Calc (NIH): 75 mg/dL (ref 0–99)
LDL/HDL Ratio: 1.2 ratio (ref 0.0–3.2)
Triglycerides: 79 mg/dL (ref 0–149)
VLDL Cholesterol Cal: 15 mg/dL (ref 5–40)

## 2024-03-17 LAB — HEMOGLOBIN A1C
Est. average glucose Bld gHb Est-mCnc: 105 mg/dL
Hgb A1c MFr Bld: 5.3 % (ref 4.8–5.6)

## 2024-03-17 LAB — INSULIN, RANDOM: INSULIN: 21.3 u[IU]/mL (ref 2.6–24.9)

## 2024-03-17 LAB — VITAMIN B12: Vitamin B-12: 728 pg/mL (ref 232–1245)

## 2024-04-06 DIAGNOSIS — R300 Dysuria: Secondary | ICD-10-CM | POA: Diagnosis not present

## 2024-04-10 NOTE — Progress Notes (Unsigned)
 SUBJECTIVE: Discussed the use of AI scribe software for clinical note transcription with the patient, who gave verbal consent to proceed.  Chief Complaint: Obesity  Interim History: She is up 4 lbs since her last visit.  Down 31 lbs overall TBW loss of ~12%  Down 55 lbs from peak weight TBW loss of 19.4% from peak weight  Kathleen [name]  is here to discuss her progress with her obesity treatment plan. She is on the keeping a food journal and adhering to recommended goals of 1200 calories and 85 grams of protein and states she is following her eating plan approximately 50 % of the time. She states she is exercising walking /steps 30 minutes 7 times per week.  Kathleen [name] is an 83 year old female with obesity and type 2 diabetes who presents for follow-up on her obesity treatment plan.  She has gained four pounds since her last visit. She is tracking her caloric intake at 1200 calories and 85 grams of protein daily but finds it challenging to meet her protein goals. She is consuming more whole foods, drinking adequate water, and not skipping meals. She sleeps seven to nine hours nightly and walks for at least thirty minutes daily.   Recent celebrations and stress have led to dietary deviations, including indulgences in high-calorie foods like ice cream and key lime pie. Her husband continues to recover from major surgery, his diet is nearly normal now. Increased stress with meal planning and prepping during husband's recovery and dietary restrictions, but feels she can now get back on track with her previous nutrition plan and goals.   She is on Mounjaro  5 mg weekly for type 2 diabetes, with stable blood glucose levels ranging from 99 to 125 mg/dL. Her last A1c was 5.3%. During a recent urinary tract infection caused by E. coli, she struggled with protein intake and experienced some dietary indulgences, contributing to her weight gain. The UTI has been treated, and she is feeling better.  Her  hypertension is treated with bisoprolol  5 mg daily, chlorthalidone  50 mg daily, and valsartan  80 mg twice daily. She is also on simvastatin  40 mg daily for hyperlipidemia.  She reports recent constipation and has tried Miralax, which was unpredictable. She has been craving salty foods and notes an increase in water weight. Her husband is recovering from surgery, and she has been adjusting her meals to accommodate his dietary needs, impacting her usual dietary regimen. OBJECTIVE: Visit Diagnoses: Problem List Items Addressed This Visit     Hypertension associated with type 2 diabetes mellitus (HCC)   Hyperlipidemia associated with type 2 diabetes mellitus (HCC)   Vitamin D  deficiency   Obesity (HCC)- starting BMI 44.46   Diabetes mellitus (HCC) - Primary   Other Visit Diagnoses       Lactose intolerance         Obesity Weight increased by four pounds since last visit. Caloric intake is 1200 calories with 85 grams of protein daily. Increased whole food consumption but insufficient protein intake. Engaging in regular physical activity with 30 minutes of walking daily. Recent weight gain attributed to dietary indulgences and stress. Muscle mass maintained, slight increase in water weight and adipose tissue. - Continue tracking caloric intake and protein consumption./but can use Cat 2 plan as guide as well.  - Increase protein intake with foods like chicken and pork tenderloins as you are doing. - Maintain regular physical activity. - Implement strategies for portion control during celebrations. - Monitor weight and adjust dietary  habits as needed.  Type 2 diabetes mellitus Currently managed with Mounjaro  5 mg weekly. Blood glucose levels well-controlled with readings between 99 and 125 mg/dL. Last HbA1c was 5.3%. No changes to Mounjaro  dosage due to adequate glucose control and concerns about protein intake. Lab Results  Component Value Date   HGBA1C 5.3 03/16/2024   HGBA1C 5.2 11/18/2023    HGBA1C 5.9 (H) 06/24/2023   Lab Results  Component Value Date   LDLCALC 75 03/16/2024   CREATININE 1.01 (H) 03/16/2024   INSULIN   Date Value Ref Range Status  03/16/2024 21.3 2.6 - 24.9 uIU/mL Final  ]She is working  on nutrition plan to decrease simple carbohydrates, increase lean proteins and exercise to promote weight loss and improve glycemic control .  - Continue Mounjaro  5 mg weekly.- no refill needed this visit.  - Monitor blood glucose levels regularly. - Ensure adequate protein intake   Hypertension Blood pressure is adequately controlled with current medication regimen including bisoprolol , chlorthalidone , and valsartan . Hypertension asymptomatic, reasonably well controlled, and no significant medication side effects noted.  Medication(s): Bisoprolol  5 mg daily                         Chlorthalidone  50 mg daily                         Valsartan  80 mg twice daily  BP Readings from Last 3 Encounters:  04/11/24 136/80  03/16/24 135/78  02/10/24 115/76   Lab Results  Component Value Date   CREATININE 1.01 (H) 03/16/2024   CREATININE 0.94 12/16/2023   CREATININE 1.30 (H) 11/18/2023   No results found for: GFR  Plan: Continue all antihypertensives at current dosages. Continue to work on nutrition plan to promote weight loss and improve BP control.    Hyperlipidemia Managed with simvastatin  40 mg daily. LDL is not at goal. HDL is at goal. Trig at goal.  Medication(s): simvastatin  40 mg daily. No reported SE.+  Mounjaro  Cardiovascular risk factors: advanced age (older than 49 for men, 69 for women), diabetes mellitus, dyslipidemia, hypertension, microalbuminuria, obesity (BMI >= 30 kg/m2), and sedentary lifestyle  Lab Results  Component Value Date   CHOL 152 03/16/2024   HDL 62 03/16/2024   LDLCALC 75 03/16/2024   TRIG 79 03/16/2024   CHOLHDL 2.2 09/02/2021   CHOLHDL 2.5 03/11/2021   CHOLHDL 2.4 09/10/2020   Lab Results  Component Value Date   ALT 12  03/16/2024   AST 16 03/16/2024   ALKPHOS 61 03/16/2024   BILITOT 0.3 03/16/2024   The ASCVD Risk score (Arnett DK, et al., 2019) failed to calculate for the following reasons:   The 2019 ASCVD risk score is only valid for ages 35 to 32  Plan: Continue simvastatin .  Continue Mounjaro .  Continue to work on nutrition plan -decreasing simple carbohydrates, increasing lean proteins, decreasing saturated fats and cholesterol , avoiding trans fats and exercise as able to promote weight loss, improve lipids and decrease cardiovascular risks.  Lactose intolerance Lactose intolerance may contribute to difficulty in meeting protein needs. - Incorporate lactose-free protein sources like Chobani yogurt.  Constipation Experiencing constipation with unpredictable results from Miralax. Prunes were considered but found to be too sugary in large amounts. - Consider using milk of magnesia for more predictable results. - Try psyllium husk capsules as a fiber supplement. - Ensure adequate water intake. - Monitor bowel movements and adjust fiber intake as needed.  Vitals  Temp: 98.7 F (37.1 C) BP: 136/80 Pulse Rate: 61 SpO2: 99 %   Anthropometric Measurements Height: 5' 4 (1.626 m) Weight: 228 lb (103.4 kg) BMI (Calculated): 39.12 Weight at Last Visit: 224lb Weight Lost Since Last Visit: 0lb Weight Gained Since Last Visit: 4lb Starting Weight: 259lb Total Weight Loss (lbs): 31 lb (14.1 kg) Peak Weight: 283lb   Body Composition  Body Fat %: 50.5 % Fat Mass (lbs): 115.2 lbs Muscle Mass (lbs): 107.2 lbs Total Body Water (lbs): 80.8 lbs Visceral Fat Rating : 18   Other Clinical Data Fasting: no Labs: no Today's Visit #: 96 Starting Date: 04/12/18     ASSESSMENT AND PLAN:  Diet: Kathleen [name]  is currently in the action stage of change. As such, her goal is to continue with weight loss efforts. She has agreed to Category 2 Plan and keeping a food journal and adhering to recommended  goals of 1200 calories and 85 grams of protein.  Exercise: Kathleen [name]  has been instructed to work up to a goal of 150 minutes of combined cardio and strengthening exercise per week and to try a geriatric exercise plan for weight loss and overall health benefits.   Behavior Modification:  We discussed the following Behavioral Modification Strategies today: increasing lean protein intake, decreasing simple carbohydrates, increasing vegetables, increase H2O intake, increase high fiber foods, no skipping meals, meal planning and cooking strategies, holiday eating strategies, avoiding temptations, and planning for success. We discussed various medication options to help Kathleen [name]  with her weight loss efforts and we both agreed to continue current treatment plan.  Return in about 4 weeks (around 05/09/2024).Kathleen [name] She was informed of the importance of frequent follow up visits to maximize her success with intensive lifestyle modifications for her multiple health conditions.  Attestation Statements:   Reviewed by clinician on day of visit: allergies, medications, problem list, medical history, surgical history, family history, social history, and previous encounter notes.   Time spent on visit including pre-visit chart review and post-visit care and charting was 45 minutes.    Arvella Massingale, PA-C

## 2024-04-11 ENCOUNTER — Encounter (INDEPENDENT_AMBULATORY_CARE_PROVIDER_SITE_OTHER): Payer: Self-pay | Admitting: Physician Assistant

## 2024-04-11 ENCOUNTER — Ambulatory Visit (INDEPENDENT_AMBULATORY_CARE_PROVIDER_SITE_OTHER): Admitting: Physician Assistant

## 2024-04-11 VITALS — BP 136/80 | HR 61 | Temp 98.7°F | Ht 64.0 in | Wt 228.0 lb

## 2024-04-11 DIAGNOSIS — Z7985 Long-term (current) use of injectable non-insulin antidiabetic drugs: Secondary | ICD-10-CM | POA: Diagnosis not present

## 2024-04-11 DIAGNOSIS — K5900 Constipation, unspecified: Secondary | ICD-10-CM

## 2024-04-11 DIAGNOSIS — E1169 Type 2 diabetes mellitus with other specified complication: Secondary | ICD-10-CM

## 2024-04-11 DIAGNOSIS — E785 Hyperlipidemia, unspecified: Secondary | ICD-10-CM

## 2024-04-11 DIAGNOSIS — I152 Hypertension secondary to endocrine disorders: Secondary | ICD-10-CM | POA: Diagnosis not present

## 2024-04-11 DIAGNOSIS — E559 Vitamin D deficiency, unspecified: Secondary | ICD-10-CM | POA: Diagnosis not present

## 2024-04-11 DIAGNOSIS — E1159 Type 2 diabetes mellitus with other circulatory complications: Secondary | ICD-10-CM

## 2024-04-11 DIAGNOSIS — Z6839 Body mass index (BMI) 39.0-39.9, adult: Secondary | ICD-10-CM

## 2024-04-11 DIAGNOSIS — E739 Lactose intolerance, unspecified: Secondary | ICD-10-CM | POA: Diagnosis not present

## 2024-04-11 DIAGNOSIS — E669 Obesity, unspecified: Secondary | ICD-10-CM

## 2024-04-22 ENCOUNTER — Other Ambulatory Visit (INDEPENDENT_AMBULATORY_CARE_PROVIDER_SITE_OTHER): Payer: Self-pay | Admitting: Physician Assistant

## 2024-04-22 DIAGNOSIS — E1159 Type 2 diabetes mellitus with other circulatory complications: Secondary | ICD-10-CM

## 2024-04-25 ENCOUNTER — Other Ambulatory Visit (INDEPENDENT_AMBULATORY_CARE_PROVIDER_SITE_OTHER): Payer: Self-pay | Admitting: Physician Assistant

## 2024-04-25 DIAGNOSIS — E1159 Type 2 diabetes mellitus with other circulatory complications: Secondary | ICD-10-CM

## 2024-04-27 ENCOUNTER — Other Ambulatory Visit (INDEPENDENT_AMBULATORY_CARE_PROVIDER_SITE_OTHER): Payer: Self-pay | Admitting: Physician Assistant

## 2024-04-27 DIAGNOSIS — E1159 Type 2 diabetes mellitus with other circulatory complications: Secondary | ICD-10-CM

## 2024-05-09 ENCOUNTER — Encounter (INDEPENDENT_AMBULATORY_CARE_PROVIDER_SITE_OTHER): Payer: Self-pay | Admitting: Physician Assistant

## 2024-05-09 ENCOUNTER — Ambulatory Visit (INDEPENDENT_AMBULATORY_CARE_PROVIDER_SITE_OTHER): Admitting: Physician Assistant

## 2024-05-09 VITALS — BP 159/82 | HR 59 | Temp 98.0°F | Ht 64.0 in | Wt 229.0 lb

## 2024-05-09 DIAGNOSIS — E1159 Type 2 diabetes mellitus with other circulatory complications: Secondary | ICD-10-CM

## 2024-05-09 DIAGNOSIS — E1169 Type 2 diabetes mellitus with other specified complication: Secondary | ICD-10-CM | POA: Diagnosis not present

## 2024-05-09 DIAGNOSIS — E739 Lactose intolerance, unspecified: Secondary | ICD-10-CM

## 2024-05-09 DIAGNOSIS — E785 Hyperlipidemia, unspecified: Secondary | ICD-10-CM | POA: Diagnosis not present

## 2024-05-09 DIAGNOSIS — I152 Hypertension secondary to endocrine disorders: Secondary | ICD-10-CM

## 2024-05-09 DIAGNOSIS — Z7985 Long-term (current) use of injectable non-insulin antidiabetic drugs: Secondary | ICD-10-CM

## 2024-05-09 DIAGNOSIS — Z85038 Personal history of other malignant neoplasm of large intestine: Secondary | ICD-10-CM

## 2024-05-09 DIAGNOSIS — Z6839 Body mass index (BMI) 39.0-39.9, adult: Secondary | ICD-10-CM

## 2024-05-09 MED ORDER — CHLORTHALIDONE 50 MG PO TABS: 50.0000 mg | ORAL_TABLET | Freq: Every day | ORAL | 0 refills | Status: AC

## 2024-05-09 MED ORDER — VALSARTAN 80 MG PO TABS: 80.0000 mg | ORAL_TABLET | Freq: Two times a day (BID) | ORAL | 0 refills | Status: AC

## 2024-05-09 MED ORDER — GLUCOSE BLOOD VI STRP: ORAL_STRIP | 12 refills | Status: AC

## 2024-05-09 MED ORDER — ACCU-CHEK SOFTCLIX LANCETS MISC: 0 refills | Status: AC

## 2024-05-09 MED ORDER — SIMVASTATIN 40 MG PO TABS: 40.0000 mg | ORAL_TABLET | Freq: Every day | ORAL | 0 refills | Status: AC

## 2024-05-09 MED ORDER — BISOPROLOL FUMARATE 5 MG PO TABS: 5.0000 mg | ORAL_TABLET | Freq: Every day | ORAL | 0 refills | Status: AC

## 2024-05-09 NOTE — Progress Notes (Signed)
 SUBJECTIVE: Discussed the use of AI scribe software for clinical note transcription with the patient, who gave verbal consent to proceed.  Chief Complaint: Obesity  Interim History: She is up 1 lb since her last visit.  Down 30 lbs overall TBW loss of 11.6%  Kathleen [name]  is here to discuss her progress with her obesity treatment plan. She is on the keeping a food journal and adhering to recommended goals of 1100-1200 calories and 85 grams of protein and states she is following her eating plan approximately 75 % of the time. She states she is exercising walking 3-4 times per week.  Kathleen [name]  B Boyko is an 83 year old female with obesity, type 2 diabetes, hypertension, and hyperlipidemia who presents for a follow-up on her obesity treatment plan.  She is adhering to a journaling plan, consuming 1100-1200 calories and 85 grams of protein 75% of the time. She is incorporating more whole foods into her diet but is not consistently meeting her protein goals and is not drinking enough water. She is diligent about not skipping meals and counts her steps daily. Despite being up one pound since her last visit, she has achieved a total weight loss of 30 pounds.  She reports having eaten salty foods, such as oyster crackers and beef jerky, several times in the last couple of weeks. There is also a recent increase in blood pressure, which is usually well-controlled.  She is currently taking simvastatin  for cholesterol, chlorthalidone  as a diuretic, and valsartan  and bisoprolol  for blood pressure management. No issues with these medications. She also uses test strips and lancets for diabetes management, with recent changes in insurance coverage affecting her supplies.  Her glucose levels have been stable, mostly around 100, with a recent high of 127. She experienced a low of 74 after a day with a large lunch and minimal dinner, consisting of beef jerky and cheese.  She has a history of colon cancer and has had  colonoscopies. Her last colonoscopy was three years ago. She is considering whether to undergo another colonoscopy due to her age and past experiences of others close to her.  She has been experiencing some swelling in her legs, which she attributes to not drinking enough water and consuming too much sodium. [place] an area affected by [place] crisis, leading her to use bottled water for drinking.  She has received the flu and RSV vaccines but has decided against the COVID booster. OBJECTIVE: Visit Diagnoses: Problem List Items Addressed This Visit     Hypertension associated with type 2 diabetes mellitus (HCC)   Relevant Medications   bisoprolol  (ZEBETA ) 5 MG tablet   chlorthalidone  (HYGROTON ) 50 MG tablet   simvastatin  (ZOCOR ) 40 MG tablet   valsartan  (DIOVAN ) 80 MG tablet   Hyperlipidemia associated with type 2 diabetes mellitus (HCC)   Relevant Medications   bisoprolol  (ZEBETA ) 5 MG tablet   chlorthalidone  (HYGROTON ) 50 MG tablet   simvastatin  (ZOCOR ) 40 MG tablet   valsartan  (DIOVAN ) 80 MG tablet   Obesity (HCC)- starting BMI 44.46   Diabetes mellitus (HCC) - Primary   Relevant Medications   simvastatin  (ZOCOR ) 40 MG tablet   valsartan  (DIOVAN ) 80 MG tablet   Accu-Chek Softclix Lancets lancets   glucose blood test strip   Other Visit Diagnoses       History of colon cancer         Lactose intolerance         BMI 39.0-39.9,adult Current BMI 39.4  Obesity Weight increased by 1 pound since last visit, with a total weight loss of 30 pounds.  Current weight gain attributed to increased sodium intake and insufficient protein consumption. Water weight gain of 3.8 pounds, adipose gain of 2.8 pounds, and muscle loss of 1.2 pounds.  Challenges with lactose intolerance affecting protein intake. - Continue current dietary plan with focus on reducing sodium intake. - Increase protein intake through alternative sources such as beans, chicken sausage, and turkey sausage. -  Monitor weight and body composition regularly. - Scheduled follow-up appointment on January 7th for fasting metabolism test and potential Mounjaro  dose adjustment.  Type 2 diabetes mellitus Blood glucose levels well-controlled with recent readings ranging from 93 to 127 mg/dL. Occasional hypoglycemic episodes, such as a reading of 74 mg/dL after a large meal. Current medication regimen includes Mounjaro . Denies mass in neck, dysphagia, dyspepsia, persistent hoarseness, abdominal pain, or N/V/Constipation or diarrhea. Has annual eye exam. Mood is stable.  Lab Results  Component Value Date   HGBA1C 5.3 03/16/2024   HGBA1C 5.2 11/18/2023   HGBA1C 5.9 (H) 06/24/2023   Lab Results  Component Value Date   LDLCALC 75 03/16/2024   CREATININE 1.01 (H) 03/16/2024   She is working  on nutrition plan to decrease simple carbohydrates, increase lean proteins and exercise to promote weight loss and improve glycemic control . Last A1c much improved and at goal.  - Continue current diabetes management plan/nutrition plan and exercise. - Monitor blood glucose levels, especially in the evening, to assess for potential hypoglycemia. - Ensure adequate protein intake to prevent hypoglycemic episodes.  Essential hypertension Blood pressure elevated today, likely due to increased sodium intake. Current medications include chlorthalidone  and valsartan . BP Readings from Last 3 Encounters:  05/09/24 (!) 159/82  04/11/24 136/80  03/16/24 135/78   Lab Results  Component Value Date   NA 139 03/16/2024   CL 100 03/16/2024   K 4.5 03/16/2024   CO2 22 03/16/2024   BUN 35 (H) 03/16/2024   CREATININE 1.01 (H) 03/16/2024   EGFR 56 (L) 03/16/2024   CALCIUM 9.6 03/16/2024   ALBUMIN 4.4 03/16/2024   GLUCOSE 90 03/16/2024   Continue to work on nutrition plan to promote weight loss and improve BP control.  - Continue current antihypertensive medications- valsartan  80 mg BID, chlorthalidone  50 mg daily and  bisoprolol  5 mg daily and medications refilled this visit x 90 days. . - Reduce sodium intake to manage blood pressure. Meds ordered this encounter  Medications   bisoprolol  (ZEBETA ) 5 MG tablet    Sig: Take 1 tablet (5 mg total) by mouth daily.    Dispense:  90 tablet    Refill:  0   chlorthalidone  (HYGROTON ) 50 MG tablet    Sig: Take 1 tablet (50 mg total) by mouth daily.    Dispense:  90 tablet    Refill:  0   simvastatin  (ZOCOR ) 40 MG tablet    Sig: Take 1 tablet (40 mg total) by mouth at bedtime.    Dispense:  90 tablet    Refill:  0   valsartan  (DIOVAN ) 80 MG tablet    Sig: Take 1 tablet (80 mg total) by mouth 2 (two) times daily.    Dispense:  180 tablet    Refill:  0   Accu-Chek Softclix Lancets lancets    Sig: USE AS DIRECTED IN THE MORNING, AT NOON, AND AT BEDTIME.    Dispense:  100 each    Refill:  0   glucose  blood test strip    Sig: Use as instructed    Dispense:  100 each    Refill:  12    Please provide test strips that correspond to her machine provided by [facility]    Hyperlipidemia LDL is not at goal for patient with T2DM. HDL at goal. Trig at goal.  Medication(s): simvastatin  40 mg daily. No reported SE.    Mounjaro   Cardiovascular risk factors: advanced age (older than 12 for men, 82 for women), diabetes mellitus, dyslipidemia, hypertension, obesity (BMI >= 30 kg/m2), and sedentary lifestyle  Lab Results  Component Value Date   CHOL 152 03/16/2024   HDL 62 03/16/2024   LDLCALC 75 03/16/2024   TRIG 79 03/16/2024   CHOLHDL 2.2 09/02/2021   CHOLHDL 2.5 03/11/2021   CHOLHDL 2.4 09/10/2020   Lab Results  Component Value Date   ALT 12 03/16/2024   AST 16 03/16/2024   ALKPHOS 61 03/16/2024   BILITOT 0.3 03/16/2024   The ASCVD Risk score (Arnett DK, et al., 2019) failed to calculate for the following reasons:   The 2019 ASCVD risk score is only valid for ages 72 to 3  Plan: Continue statin. Continue Mounjaro  Continue to work on nutrition plan  -decreasing simple carbohydrates, increasing lean proteins, decreasing saturated fats and cholesterol , avoiding trans fats and exercise as able to promote weight loss, improve lipids and decrease cardiovascular risks. Follow up lipids in 4-6 months.   Lactose intolerance Affects protein intake options. Recent use of lactase supplements without significant weight impact. - Continue lactase supplements as needed. - Explore alternative protein sources to accommodate lactose intolerance.  History of colon cancer Discussion about the need for another colonoscopy due to history. She has concerns about risks associated with colonoscopy, including perforation and complications and people close to her that have experienced complications. Decision to consult with gastroenterologist for further evaluation and discussion of risks and benefits. - Consult with gastroenterologist to discuss the necessity and risks of another colonoscopy.    Vitals Temp: 98 F (36.7 C) BP: (!) 159/82 Pulse Rate: (!) 59 SpO2: 99 %   Anthropometric Measurements Height: 5' 4 (1.626 m) Weight: 229 lb (103.9 kg) BMI (Calculated): 39.29 Weight at Last Visit: 228 lb Weight Lost Since Last Visit: 0 Weight Gained Since Last Visit: 1 lb Starting Weight: 259 lb Total Weight Loss (lbs): 30 lb (13.6 kg) Peak Weight: 283 lb   Body Composition  Body Fat %: 51.4 % Fat Mass (lbs): 118 lbs Muscle Mass (lbs): 106 lbs Total Body Water (lbs): 84.6 lbs Visceral Fat Rating : 19   Other Clinical Data Fasting: No Labs: No Today's Visit #: 97 Starting Date: 04/12/18     ASSESSMENT AND PLAN:  Diet: Nyssa  is currently in the action stage of change. As such, her goal is to continue with weight loss efforts. She has agreed to keeping a food journal and adhering to recommended goals of 1100-1200 calories and 85 grams of protein.  Exercise: Khylah  has been instructed to work up to a goal of 150 minutes of combined  cardio and strengthening exercise per week and to try a geriatric exercise plan for weight loss and overall health benefits.   Behavior Modification:  We discussed the following Behavioral Modification Strategies today: increasing lean protein intake, decreasing simple carbohydrates, increasing vegetables, increase H2O intake, decreasing sodium intake, increase high fiber foods, meal planning and cooking strategies, holiday eating strategies, avoiding temptations, planning for success, and keep a strict food journal. We  discussed various medication options to help Monike  with her weight loss efforts and we both agreed to continue Mounjaro  5 mg weekly for T2DM with consideration to increase at next visit.  Return in about 6 weeks (around 06/20/2024).SABRA She was informed of the importance of frequent follow up visits to maximize her success with intensive lifestyle modifications for her multiple health conditions.  Attestation Statements:   Reviewed by clinician on day of visit: allergies, medications, problem list, medical history, surgical history, family history, social history, and previous encounter notes.   Time spent on visit including pre-visit chart review and post-visit care and charting was 44 minutes.    Rashmi Tallent, PA-C

## 2024-05-20 ENCOUNTER — Other Ambulatory Visit: Payer: Self-pay

## 2024-05-20 DIAGNOSIS — K7400 Hepatic fibrosis, unspecified: Secondary | ICD-10-CM

## 2024-06-02 ENCOUNTER — Inpatient Hospital Stay: Admission: RE | Admit: 2024-06-02 | Discharge: 2024-06-02

## 2024-06-02 DIAGNOSIS — K7400 Hepatic fibrosis, unspecified: Secondary | ICD-10-CM

## 2024-06-22 ENCOUNTER — Ambulatory Visit (INDEPENDENT_AMBULATORY_CARE_PROVIDER_SITE_OTHER): Admitting: Physician Assistant

## 2024-06-23 ENCOUNTER — Ambulatory Visit (INDEPENDENT_AMBULATORY_CARE_PROVIDER_SITE_OTHER): Admitting: Nurse Practitioner

## 2024-06-23 ENCOUNTER — Encounter (INDEPENDENT_AMBULATORY_CARE_PROVIDER_SITE_OTHER): Payer: Self-pay | Admitting: Nurse Practitioner

## 2024-06-23 VITALS — BP 136/72 | HR 61 | Temp 97.7°F | Ht 64.0 in | Wt 227.0 lb

## 2024-06-23 DIAGNOSIS — N1831 Chronic kidney disease, stage 3a: Secondary | ICD-10-CM | POA: Diagnosis not present

## 2024-06-23 DIAGNOSIS — E66812 Obesity, class 2: Secondary | ICD-10-CM | POA: Diagnosis not present

## 2024-06-23 DIAGNOSIS — E1122 Type 2 diabetes mellitus with diabetic chronic kidney disease: Secondary | ICD-10-CM | POA: Diagnosis not present

## 2024-06-23 DIAGNOSIS — E1159 Type 2 diabetes mellitus with other circulatory complications: Secondary | ICD-10-CM | POA: Diagnosis not present

## 2024-06-23 DIAGNOSIS — Z6839 Body mass index (BMI) 39.0-39.9, adult: Secondary | ICD-10-CM | POA: Diagnosis not present

## 2024-06-23 DIAGNOSIS — I10 Essential (primary) hypertension: Secondary | ICD-10-CM

## 2024-06-23 DIAGNOSIS — E1169 Type 2 diabetes mellitus with other specified complication: Secondary | ICD-10-CM | POA: Diagnosis not present

## 2024-06-23 DIAGNOSIS — E785 Hyperlipidemia, unspecified: Secondary | ICD-10-CM | POA: Diagnosis not present

## 2024-06-23 DIAGNOSIS — E669 Obesity, unspecified: Secondary | ICD-10-CM

## 2024-06-23 DIAGNOSIS — Z7985 Long-term (current) use of injectable non-insulin antidiabetic drugs: Secondary | ICD-10-CM | POA: Diagnosis not present

## 2024-06-23 DIAGNOSIS — R0602 Shortness of breath: Secondary | ICD-10-CM | POA: Diagnosis not present

## 2024-06-23 MED ORDER — TIRZEPATIDE 7.5 MG/0.5ML ~~LOC~~ SOAJ: 7.5000 mg | SUBCUTANEOUS | 0 refills | Status: AC

## 2024-06-23 NOTE — Progress Notes (Signed)
 " Office: [phone]  /  Fax: [phone]  WEIGHT SUMMARY AND BIOMETRICS  Weight Lost Since Last Visit: 2 lb  Weight Gained Since Last Visit: 0   Vitals Temp: 97.7 F (36.5 C) BP: 136/72 Pulse Rate: 61 SpO2: 99 %   Anthropometric Measurements Height: 5' 4 (1.626 m) Weight: 227 lb (103 kg) BMI (Calculated): 38.95 Weight at Last Visit: 229 lb Weight Lost Since Last Visit: 2 lb Weight Gained Since Last Visit: 0 Starting Weight: 259 lb Total Weight Loss (lbs): 32 lb (14.5 kg) Peak Weight: 283 lb   Body Composition  Body Fat %: 51.2 % Fat Mass (lbs): 116.4 lbs Muscle Mass (lbs): 105.2 lbs Total Body Water (lbs): 81.6 lbs Visceral Fat Rating : 18   Other Clinical Data Fasting: yes Labs: yes Today's Visit #: 98 Starting Date: 04/12/18 Comments: IC /fasting labs    Total Weight Loss: 32 pounds Percent of body weight lost: 12%   Bio Impedance Data reviewed with patient: Muscle is down 0.8 pounds, adipose is down 1.6 pounds  HPI  Chief Complaint: OBESITY  Kathleen [name]  is here to discuss her progress with her obesity treatment plan. She is on the the Category 2 Plan and states she is following her eating plan approximately 50 % of the time. She states she is not currently exercising   Interval History:  Kathleen [name]  did a repeat IC today and VO2 is 221 with REE of 1526- calculated BMR is 1580- same. Last IC was 1627 in 2023. Increasing protein and resistance training is recommended.   She does have an upcoming endoscopy and coloscopy. Had liver elastography < 5 05/23/24 which showed high probability of normal. Continues to be evaluated for gallbladder.  She is lactose intolerance- using lactaid and helps but has had to reduce dairy. She is getting maybe 24 ounces of water and then does unsweet tea or coffee.  She has been getting less protein since she can't do dairy Breakfast: 2 boiled eggs, low calorie bread- sugar free jelly Lunch: deli turkey(2 ounces) sandwich  with fruit, celery with hummus.  Dinner: Chicken with vegetables or steak - filet.  She will use Greek yogurt and catalina crunch cereal as a snack  She has not been walking as much- weather and holidays.  She has promised herself to walk 2 days a week for 20-25 minutes. She will also start her exercise bike.   Kathleen [name]  does have hypertension and her BP's are currently well controlled on Bisoprolol  5 mg every day, Chlorthalidone  50 mg  every day and valsartan  80 mg BID . Denies headaches, chest pain shortness of breath at rest and dizziness  BP Readings from Last 3 Encounters:  06/23/24 136/72  05/09/24 (!) 159/82  04/11/24 136/80   She is currently on simvastatin  40 mg every day for hyperlipidemia and denies side effects with medication. LDL is close to goal of < 70.  Lab Results  Component Value Date   CHOL 152 03/16/2024   HDL 62 03/16/2024   LDLCALC 75 03/16/2024   TRIG 79 03/16/2024   CHOLHDL 2.2 09/02/2021   Kathleen [name]  does have Type 2 Diabetes and is currently on Mounjaro  5 mg SQ once a week.  Her Last A1c was in normal range on medication. Denies side effects. Her blood sugars fasting have been 98-122- most in low 100's. She is interested in increasing her dose as discussed with Shawn at last visit. Will occasionally have dumping after eating high fat.  Lab Results  Component Value Date  HGBA1C 5.3 03/16/2024      PHYSICAL EXAM:  Blood pressure 136/72, pulse 61, temperature 97.7 F (36.5 C), height 5' 4 (1.626 m), weight 227 lb (103 kg), SpO2 99%. Body mass index is 38.96 kg/m.  General: Well Developed, well nourished, and in no acute distress.  HEENT: Normocephalic, atraumatic; EOMI, sclerae are anicteric. Skin: Warm and dry, good turgor Chest:  Normal excursion, shape, no gross ABN Respiratory: No conversational dyspnea; speaking in full sentences NeuroM-Sk:  Normal gross ROM * 4 extremities  Psych: A and O X 3, insight adequate, mood- full    DIAGNOSTIC DATA  REVIEWED:  BMET    Component Value Date/Time   NA 139 03/16/2024 0949   NA 141 12/14/2014 0747   K 4.5 03/16/2024 0949   K 4.2 12/14/2014 0747   CL 100 03/16/2024 0949   CO2 22 03/16/2024 0949   CO2 28 12/14/2014 0747   GLUCOSE 90 03/16/2024 0949   GLUCOSE 120 12/14/2014 0747   BUN 35 (H) 03/16/2024 0949   BUN 21.3 12/14/2014 0747   CREATININE 1.01 (H) 03/16/2024 0949   CREATININE 0.9 12/14/2014 0747   CALCIUM 9.6 03/16/2024 0949   CALCIUM 9.4 12/14/2014 0747   GFRNONAA 73 06/05/2020 1113   GFRAA 84 06/05/2020 1113   Lab Results  Component Value Date   HGBA1C 5.3 03/16/2024   HGBA1C 6.8 (H) 04/12/2018   Lab Results  Component Value Date   INSULIN  21.3 03/16/2024   INSULIN  30.6 (H) 04/12/2018   Lab Results  Component Value Date   TSH 1.360 06/24/2023   CBC    Component Value Date/Time   WBC 7.9 11/18/2023 0857   WBC 8.0 12/14/2014 0746   WBC 6.1 03/04/2011 1040   RBC 3.85 11/18/2023 0857   RBC 4.17 12/14/2014 0746   RBC 3.99 03/04/2011 1040   HGB 11.7 11/18/2023 0857   HGB 12.4 12/14/2014 0746   HCT 36.0 11/18/2023 0857   HCT 37.0 12/14/2014 0746   PLT 262 11/18/2023 0857   MCV 94 11/18/2023 0857   MCV 88.9 12/14/2014 0746   MCH 30.4 11/18/2023 0857   MCH 29.8 12/14/2014 0746   MCH 30.1 03/04/2011 1040   MCHC 32.5 11/18/2023 0857   MCHC 33.5 12/14/2014 0746   MCHC 33.1 03/04/2011 1040   RDW 12.5 11/18/2023 0857   RDW 12.5 12/14/2014 0746   Iron Studies No results found for: IRON, TIBC, FERRITIN, IRONPCTSAT Lipid Panel     Component Value Date/Time   CHOL 152 03/16/2024 0949   TRIG 79 03/16/2024 0949   HDL 62 03/16/2024 0949   CHOLHDL 2.2 09/02/2021 1139   LDLCALC 75 03/16/2024 0949   Hepatic Function Panel     Component Value Date/Time   PROT 7.1 03/16/2024 0949   PROT 7.1 12/14/2014 0747   ALBUMIN 4.4 03/16/2024 0949   ALBUMIN 3.8 12/14/2014 0747   AST 16 03/16/2024 0949   AST 16 12/14/2014 0747   ALT 12 03/16/2024 0949   ALT  15 12/14/2014 0747   ALKPHOS 61 03/16/2024 0949   ALKPHOS 52 12/14/2014 0747   BILITOT 0.3 03/16/2024 0949   BILITOT 0.40 12/14/2014 0747      Component Value Date/Time   TSH 1.360 06/24/2023 0911   Nutritional Lab Results  Component Value Date   VD25OH 46.2 03/16/2024   VD25OH 59.1 11/18/2023   VD25OH 83.3 06/24/2023     ASSESSMENT AND PLAN Class 2 severe obesity with serious comorbidity and body mass index (BMI) of 39.0 to  39.9 in adult, unspecified obesity type TREATMENT PLAN FOR OBESITY:  Recommended Dietary Goals  Kathleen [name]  is currently in the action stage of change. As such, her goal is to continue weight management plan. She has agreed to the Category 2 Plan.  Behavioral Intervention  We discussed the following Behavioral Modification Strategies today: increasing lean protein intake to established goals, increasing water intake , continue to practice mindfulness when eating, continue to work on maintaining a reduced calorie state, getting the recommended amount of protein, incorporating whole foods, making healthy choices, staying well hydrated and practicing mindfulness when eating., and increase protein intake, fibrous foods (25 grams per day for women, 30 grams for men) and water to improve satiety and decrease hunger signals. .  Additional resources provided today: NA  Recommended Physical Activity Goals  Jennavie  has been advised to work up to 150 minutes of moderate intensity aerobic activity a week and strengthening exercises 2-3 times per week for cardiovascular health, weight loss maintenance and preservation of muscle mass.   She has agreed to Start aerobic activity with a goal of 150 minutes a week at moderate intensity.    Pharmacotherapy We discussed various medication options to help Kathleen [name]  with her weight loss efforts and we both agreed to increase Mounjaro  to 7.5 mg SQ QW for Type 2 Diabetes- denies side effects with the medication.  ASSOCIATED  CONDITIONS ADDRESSED TODAY  Action/Plan  Type 2 diabetes mellitus with stage 3a chronic kidney disease, without long-term current use of insulin  (HCC) Continue Category 2  meal plan, limit simple carbohydrates. Increase lean protein, water and fiber.  Increase Mounjaro  to 7.5 mg SQ QW and monitor side effects Continue to monitor blood sugars and follow regularly with PCP Start exercise with walking 2-3 days a week for 20 minutes or exercise bike -     Tirzepatide ; Inject 7.5 mg into the skin once a week.  Dispense: 6 mL; Refill: 0  SOB (shortness of breath) on exertion -IC done today, Approx 100 calories less than  previous IC done 2 years ago   Type 2 diabetes mellitus in patient with obesity (HCC) Continue Category 2  meal plan, limit simple carbohydrates. Increase lean protein, water and fiber.  Increase Mounjaro  to 7.5 mg SQ QW and monitor side effects Continue to monitor blood sugars and follow regularly with PCP Start exercise with walking 2-3 days a week for 20 minutes or exercise bike -     Tirzepatide ; Inject 7.5 mg into the skin once a week.  Dispense: 6 mL; Refill: 0  Hypertension associated with type 2 diabetes mellitus (HCC) Continue Bisoprolol  5 mg every day, Chlorthalidone  50 mg  every day and valsartan  80 mg BID .  Continue Category 2 meal plan  and DASH diet Monitor BP and if consistently >140/90 notify PCP If develops headaches, chest pain, shortness of breath or dizziness go to ER Continue to follow regularly with PCP  Hyperlipidemia associated with type 2 diabetes mellitus (HCC) Focus on implementing category 2 meal plan, limit saturated fats. Increase lean protein, fiber and water Continue Simvastatin  20 mg every day and follow regularly with PCP Start exercise with walking 2-3 days a week for 20 minutes or exercise bike          Return in about 4 weeks (around 07/21/2024).Kathleen [name] She was informed of the importance of frequent follow up visits to maximize her  success with intensive lifestyle modifications for her multiple health conditions.   ATTESTASTION STATEMENTS:  Reviewed by clinician on day  of visit: allergies, medications, problem list, medical history, surgical history, family history, social history, and previous encounter notes.   I personally spent a total of 42 minutes in the care of the patient today including preparing to see the patient, getting/reviewing separately obtained history, performing a medically appropriate exam/evaluation, counseling and educating, placing orders, and documenting clinical information in the EHR.   Lonell Liverpool ANP-C "

## 2024-07-20 ENCOUNTER — Ambulatory Visit (INDEPENDENT_AMBULATORY_CARE_PROVIDER_SITE_OTHER): Admitting: Nurse Practitioner

## 2024-07-20 ENCOUNTER — Encounter (INDEPENDENT_AMBULATORY_CARE_PROVIDER_SITE_OTHER): Payer: Self-pay | Admitting: Nurse Practitioner

## 2024-07-20 VITALS — BP 139/74 | HR 63 | Ht 64.0 in | Wt 224.0 lb

## 2024-07-20 DIAGNOSIS — Z7985 Long-term (current) use of injectable non-insulin antidiabetic drugs: Secondary | ICD-10-CM | POA: Diagnosis not present

## 2024-07-20 DIAGNOSIS — E1122 Type 2 diabetes mellitus with diabetic chronic kidney disease: Secondary | ICD-10-CM | POA: Diagnosis not present

## 2024-07-20 DIAGNOSIS — E66812 Obesity, class 2: Secondary | ICD-10-CM | POA: Diagnosis not present

## 2024-07-20 DIAGNOSIS — N1831 Chronic kidney disease, stage 3a: Secondary | ICD-10-CM

## 2024-07-20 DIAGNOSIS — E1159 Type 2 diabetes mellitus with other circulatory complications: Secondary | ICD-10-CM

## 2024-07-20 DIAGNOSIS — E1169 Type 2 diabetes mellitus with other specified complication: Secondary | ICD-10-CM

## 2024-07-20 DIAGNOSIS — I129 Hypertensive chronic kidney disease with stage 1 through stage 4 chronic kidney disease, or unspecified chronic kidney disease: Secondary | ICD-10-CM | POA: Diagnosis not present

## 2024-07-20 DIAGNOSIS — Z6839 Body mass index (BMI) 39.0-39.9, adult: Secondary | ICD-10-CM

## 2024-07-20 DIAGNOSIS — E785 Hyperlipidemia, unspecified: Secondary | ICD-10-CM | POA: Diagnosis not present

## 2024-07-20 DIAGNOSIS — E669 Obesity, unspecified: Secondary | ICD-10-CM

## 2024-07-20 NOTE — Progress Notes (Signed)
 " Office: [phone]  /  Fax: [phone]  WEIGHT SUMMARY AND BIOMETRICS  Weight Lost Since Last Visit: 3lb  Weight Gained Since Last Visit: 0lb   Vitals BP: 139/74 Pulse Rate: 63 SpO2: 98 %   Anthropometric Measurements Height: 5' 4 (1.626 m) Weight: 224 lb (101.6 kg) BMI (Calculated): 38.43 Weight at Last Visit: 227lb Weight Lost Since Last Visit: 3lb Weight Gained Since Last Visit: 0lb Starting Weight: 259lb Total Weight Loss (lbs): 35 lb (15.9 kg) Peak Weight: 283lb   Body Composition  Body Fat %: 49.6 % Fat Mass (lbs): 111.2 lbs Muscle Mass (lbs): 107 lbs Total Body Water (lbs): 78.6 lbs Visceral Fat Rating : 18   Other Clinical Data Fasting: yes Labs: No Today's Visit #: 99 Starting Date: 04/12/18    Total Weight Loss: 35 pounds- actually started at 283 and has lost almost 60 pounds Percent of body weight lost: 13.5%(21%)   Bio Impedance Data reviewed with patient: Muscle is up 1.8 pounds, adipose is down 5.2 pounds  HPI  Chief Complaint: OBESITY  Kathleen [name]  is here to discuss her progress with her obesity treatment plan. She is on the the Category 1 Plan and states she is following her eating plan approximately 75 % of the time. She states she is exercising 60 minutes 7 days per week, walking- less since the snow. She does have a walking track at [facility].    Interval History:  Since last office visit she has been trying to increase her protein, currently getting about 60 grams of protein daily.  She sets her watch to remind her to stand. She has her grandkids for remote school. She is drinking coffee, decaf tea all day and water with dinner.  Breakfast: 2 eggs, 45 cal bread x 2 slices Lunch: Hot dogs and sauerkraut( plans to changes to turkey dogs) Dinner: salad with chicken or turkey, chicken noodle soup homemade  She had colonoscopy and endoscopy 07/08/24- protonix was ordered every day for 2 month  instead of prilosec due to  some esophagitis. Does not require further colonoscopy.  Kathleen [name]  does have Type 2 Diabetes and is currently on Mounjaro  7.5 mg SQ once a week, was increased at last visit( Her insurance has changed and she needs to pay 2100 out of pocket before they will cover the medication). She did get the first 3 months.  Her Last A1c was in normal range on medication. She did have 1 episode of nausea on higher dose of Mounjaro . Her blood sugars fasting have been 100-121. Yesterday was 95. Lab Results  Component Value Date   HGBA1C 5.3 03/16/2024    Lab Results  Component Value Date   INSULIN  21.3 03/16/2024   INSULIN  30.6 (H) 04/12/2018   Kathleen [name]  does have hypertension and her BP's are currently controlled on Bisoprolol  5 mg every day, Chlorthalidone  50 mg every day and valsartan  80 mg BID . Denies headaches, chest pain shortness of breath at rest and dizziness  BP Readings from Last 3 Encounters:  07/20/24 139/74  06/23/24 136/72  05/09/24 (!) 159/82    She continues on simvastatin  40 mg every day for hyperlipidemia and denies side effects with medication. LDL is close to goal of < 70.  Lab Results  Component Value Date   CHOL 152 03/16/2024   HDL 62 03/16/2024   LDLCALC 75 03/16/2024   TRIG 79 03/16/2024   CHOLHDL 2.2 09/02/2021     PHYSICAL EXAM:  Blood pressure 139/74, pulse 63, height 5' 4 (  1.626 m), weight 224 lb (101.6 kg), SpO2 98%. Body mass index is 38.45 kg/m.  General: Well Developed, well nourished, and in no acute distress.  HEENT: Normocephalic, atraumatic; EOMI, sclerae are anicteric. Skin: Warm and dry, good turgor Chest:  Normal excursion, shape, no gross ABN Respiratory: No conversational dyspnea; speaking in full sentences NeuroM-Sk:  Normal gross ROM * 4 extremities  Psych: A and O X 3, insight adequate, mood- full    DIAGNOSTIC DATA REVIEWED:  BMET    Component Value Date/Time   NA 139 03/16/2024 0949   NA 141 12/14/2014 0747   K 4.5 03/16/2024 0949    K 4.2 12/14/2014 0747   CL 100 03/16/2024 0949   CO2 22 03/16/2024 0949   CO2 28 12/14/2014 0747   GLUCOSE 90 03/16/2024 0949   GLUCOSE 120 12/14/2014 0747   BUN 35 (H) 03/16/2024 0949   BUN 21.3 12/14/2014 0747   CREATININE 1.01 (H) 03/16/2024 0949   CREATININE 0.9 12/14/2014 0747   CALCIUM 9.6 03/16/2024 0949   CALCIUM 9.4 12/14/2014 0747   GFRNONAA 73 06/05/2020 1113   GFRAA 84 06/05/2020 1113   Lab Results  Component Value Date   HGBA1C 5.3 03/16/2024   HGBA1C 6.8 (H) 04/12/2018   Lab Results  Component Value Date   INSULIN  21.3 03/16/2024   INSULIN  30.6 (H) 04/12/2018   Lab Results  Component Value Date   TSH 1.360 06/24/2023   CBC    Component Value Date/Time   WBC 7.9 11/18/2023 0857   WBC 8.0 12/14/2014 0746   WBC 6.1 03/04/2011 1040   RBC 3.85 11/18/2023 0857   RBC 4.17 12/14/2014 0746   RBC 3.99 03/04/2011 1040   HGB 11.7 11/18/2023 0857   HGB 12.4 12/14/2014 0746   HCT 36.0 11/18/2023 0857   HCT 37.0 12/14/2014 0746   PLT 262 11/18/2023 0857   MCV 94 11/18/2023 0857   MCV 88.9 12/14/2014 0746   MCH 30.4 11/18/2023 0857   MCH 29.8 12/14/2014 0746   MCH 30.1 03/04/2011 1040   MCHC 32.5 11/18/2023 0857   MCHC 33.5 12/14/2014 0746   MCHC 33.1 03/04/2011 1040   RDW 12.5 11/18/2023 0857   RDW 12.5 12/14/2014 0746   Iron Studies No results found for: IRON, TIBC, FERRITIN, IRONPCTSAT Lipid Panel     Component Value Date/Time   CHOL 152 03/16/2024 0949   TRIG 79 03/16/2024 0949   HDL 62 03/16/2024 0949   CHOLHDL 2.2 09/02/2021 1139   LDLCALC 75 03/16/2024 0949   Hepatic Function Panel     Component Value Date/Time   PROT 7.1 03/16/2024 0949   PROT 7.1 12/14/2014 0747   ALBUMIN 4.4 03/16/2024 0949   ALBUMIN 3.8 12/14/2014 0747   AST 16 03/16/2024 0949   AST 16 12/14/2014 0747   ALT 12 03/16/2024 0949   ALT 15 12/14/2014 0747   ALKPHOS 61 03/16/2024 0949   ALKPHOS 52 12/14/2014 0747   BILITOT 0.3 03/16/2024 0949   BILITOT 0.40  12/14/2014 0747      Component Value Date/Time   TSH 1.360 06/24/2023 0911   Nutritional Lab Results  Component Value Date   VD25OH 46.2 03/16/2024   VD25OH 59.1 11/18/2023   VD25OH 83.3 06/24/2023     ASSESSMENT AND PLAN Class 2 severe obesity with serious comorbidity and body mass index (BMI) of 39.0 to 39.9 in adult, unspecified obesity type TREATMENT PLAN FOR OBESITY:  Recommended Dietary Goals  Kathleen [name]  is currently in the action stage of change.  As such, her goal is to continue weight management plan. She has agreed to the Category 1 Plan.  Behavioral Intervention  We discussed the following Behavioral Modification Strategies today: increasing lean protein intake to established goals, increasing vegetables, increasing water intake , continue to work on maintaining a reduced calorie state, getting the recommended amount of protein, incorporating whole foods, making healthy choices, staying well hydrated and practicing mindfulness when eating., and increase protein intake, fibrous foods (25 grams per day for women, 30 grams for men) and water to improve satiety and decrease hunger signals. .   Recommended Physical Activity Goals  Kathleen [name]  has been advised to work up to 150 minutes of moderate intensity aerobic activity a week and strengthening exercises 2-3 times per week for cardiovascular health, weight loss maintenance and preservation of muscle mass.   She has agreed to Continue current level of physical activity  and Combine aerobic and strengthening exercises for efficiency and improved cardiometabolic health.   Pharmacotherapy We discussed various medication options to help Kathleen [name]  with her weight loss efforts and we both agreed to continue Mounjaro  7.5 mg SQ QW for Type 2 Diabetes Mellitus- denies side effects.  .  ASSOCIATED CONDITIONS ADDRESSED TODAY  Action/Plan  Type 2 diabetes mellitus with stage 3a chronic kidney disease, without long-term current use of  insulin  (HCC) Continue Category 1  meal plan, limit simple carbohydrates. Increase lean protein, fiber and water Continue Mounjaro  7.5 mg SQ QW- monitor for side effects, continue to check blood sugars fasting Continue exercise with current goal of 150 minutes of moderate to high intensity exercise/week.  Push fluids and avoid NSAID's Continue to follow with PCP regularly  Type 2 diabetes mellitus in patient with obesity (HCC) Continue Category 1  meal plan, limit simple carbohydrates. Increase lean protein, fiber and water Continue Mounjaro  7.5 mg SQ QW- monitor for side effects Continue exercise with current goal of 150 minutes of moderate to high intensity exercise/week.  Push fluids and avoid NSAID's Continue to follow with PCP regularly  Hypertension associated with type 2 diabetes mellitus (HCC) Continue Bisoprolol  5 mg every day, Chlorthalidone  50 mg every day and valsartan  80 mg BID Continue Category 1 meal plan  and DASH diet Monitor BP and if consistently >140/90 notify PCP If develops headaches, chest pain, shortness of breath or dizziness go to ER Continue to follow regularly with PCP  Hyperlipidemia associated with type 2 diabetes mellitus (HCC) Focus on implementing category 1 meal plan, limit saturated fats. Increase lean protein, fiber and water Continue simvastatin  40 mg every day and monitor for side effects Continue to follow regularly with PCP Focus on getting 150 minutes a week of moderate to high intensity exercise          Return in about 4 weeks (around 08/17/2024).SABRA She was informed of the importance of frequent follow up visits to maximize her success with intensive lifestyle modifications for her multiple health conditions.   ATTESTASTION STATEMENTS:  Reviewed by clinician on day of visit: allergies, medications, problem list, medical history, surgical history, family history, social history, and previous encounter notes.   I personally spent a total of  45 minutes in the care of the patient today including preparing to see the patient, getting/reviewing separately obtained history, performing a medically appropriate exam/evaluation, counseling and educating, and documenting clinical information in the EHR.   Kathleen [name] ANP-C "

## 2024-08-17 ENCOUNTER — Ambulatory Visit (INDEPENDENT_AMBULATORY_CARE_PROVIDER_SITE_OTHER): Admitting: Physician Assistant
